# Patient Record
Sex: Female | Born: 1965 | Race: White | Hispanic: No | Marital: Single | Smoking: Current every day smoker
Health system: Southern US, Community
[De-identification: ages and names within clinical notes are randomized; demographics above are authoritative.]

## PROBLEM LIST (undated history)

## (undated) DIAGNOSIS — Z923 Personal history of irradiation: Secondary | ICD-10-CM

## (undated) DIAGNOSIS — I700 Atherosclerosis of aorta: Secondary | ICD-10-CM

## (undated) DIAGNOSIS — T7840XA Allergy, unspecified, initial encounter: Secondary | ICD-10-CM

## (undated) DIAGNOSIS — M1990 Unspecified osteoarthritis, unspecified site: Secondary | ICD-10-CM

## (undated) DIAGNOSIS — F329 Major depressive disorder, single episode, unspecified: Secondary | ICD-10-CM

## (undated) DIAGNOSIS — R7303 Prediabetes: Secondary | ICD-10-CM

## (undated) DIAGNOSIS — K219 Gastro-esophageal reflux disease without esophagitis: Secondary | ICD-10-CM

## (undated) DIAGNOSIS — F419 Anxiety disorder, unspecified: Secondary | ICD-10-CM

## (undated) DIAGNOSIS — F32A Depression, unspecified: Secondary | ICD-10-CM

## (undated) DIAGNOSIS — J449 Chronic obstructive pulmonary disease, unspecified: Secondary | ICD-10-CM

## (undated) DIAGNOSIS — M419 Scoliosis, unspecified: Secondary | ICD-10-CM

## (undated) DIAGNOSIS — G4730 Sleep apnea, unspecified: Secondary | ICD-10-CM

## (undated) DIAGNOSIS — C50919 Malignant neoplasm of unspecified site of unspecified female breast: Secondary | ICD-10-CM

## (undated) HISTORY — PX: BACK SURGERY: SHX140

## (undated) HISTORY — DX: Sleep apnea, unspecified: G47.30

## (undated) HISTORY — DX: Atherosclerosis of aorta: I70.0

## (undated) HISTORY — DX: Allergy, unspecified, initial encounter: T78.40XA

## (undated) HISTORY — DX: Scoliosis, unspecified: M41.9

## (undated) HISTORY — DX: Depression, unspecified: F32.A

## (undated) HISTORY — DX: Gastro-esophageal reflux disease without esophagitis: K21.9

## (undated) HISTORY — PX: ABDOMINAL HYSTERECTOMY: SHX81

## (undated) HISTORY — DX: Major depressive disorder, single episode, unspecified: F32.9

---
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
## 2008-05-12 ENCOUNTER — Ambulatory Visit: Payer: Self-pay

## 2011-08-03 ENCOUNTER — Emergency Department: Payer: Self-pay | Admitting: Emergency Medicine

## 2011-09-25 ENCOUNTER — Emergency Department: Payer: Self-pay | Admitting: Internal Medicine

## 2012-02-22 ENCOUNTER — Emergency Department: Payer: Self-pay | Admitting: Emergency Medicine

## 2012-02-24 ENCOUNTER — Emergency Department: Payer: Self-pay | Admitting: Emergency Medicine

## 2012-02-26 LAB — WOUND CULTURE

## 2013-01-18 ENCOUNTER — Emergency Department: Payer: Self-pay | Admitting: Emergency Medicine

## 2014-03-25 ENCOUNTER — Emergency Department: Payer: Self-pay | Admitting: Internal Medicine

## 2016-02-21 ENCOUNTER — Emergency Department: Payer: Self-pay

## 2016-02-21 ENCOUNTER — Encounter: Payer: Self-pay | Admitting: Emergency Medicine

## 2016-02-21 ENCOUNTER — Emergency Department
Admission: EM | Admit: 2016-02-21 | Discharge: 2016-02-21 | Disposition: A | Discharge status: Home / Self Care | Payer: Self-pay | Attending: Emergency Medicine | Admitting: Emergency Medicine

## 2016-02-21 DIAGNOSIS — F17200 Nicotine dependence, unspecified, uncomplicated: Secondary | ICD-10-CM | POA: Insufficient documentation

## 2016-02-21 DIAGNOSIS — J9801 Acute bronchospasm: Secondary | ICD-10-CM

## 2016-02-21 DIAGNOSIS — Z720 Tobacco use: Secondary | ICD-10-CM

## 2016-02-21 DIAGNOSIS — J209 Acute bronchitis, unspecified: Secondary | ICD-10-CM | POA: Insufficient documentation

## 2016-02-21 MED ORDER — ALBUTEROL SULFATE HFA 108 (90 BASE) MCG/ACT IN AERS: 1.0000 | INHALATION_SPRAY | Freq: Four times a day (QID) | RESPIRATORY_TRACT | 0 refills | Status: DC | PRN

## 2016-02-21 MED ORDER — IPRATROPIUM-ALBUTEROL 0.5-2.5 (3) MG/3ML IN SOLN
3.0000 mL | Freq: Once | RESPIRATORY_TRACT | Status: AC
  Administered 2016-02-21: 3 mL via RESPIRATORY_TRACT
  Filled 2016-02-21: qty 3

## 2016-02-21 MED ORDER — AZITHROMYCIN 250 MG PO TABS: ORAL_TABLET | ORAL | 0 refills | Status: DC

## 2016-02-21 MED ORDER — METHYLPREDNISOLONE SODIUM SUCC 125 MG IJ SOLR
125.0000 mg | Freq: Once | INTRAMUSCULAR | Status: DC
  Filled 2016-02-21: qty 2

## 2016-02-21 MED ORDER — PROMETHAZINE-DM 6.25-15 MG/5ML PO SYRP: 5.0000 mL | ORAL_SOLUTION | Freq: Four times a day (QID) | ORAL | 0 refills | Status: DC | PRN

## 2016-02-21 MED ORDER — METHYLPREDNISOLONE SODIUM SUCC 125 MG IJ SOLR
125.0000 mg | Freq: Once | INTRAMUSCULAR | Status: AC
  Administered 2016-02-21: 125 mg via INTRAMUSCULAR

## 2016-02-21 MED ORDER — FLUTICASONE PROPIONATE 50 MCG/ACT NA SUSP: 2.0000 | Freq: Every day | NASAL | 0 refills | Status: DC

## 2016-02-21 MED ORDER — PREDNISONE 10 MG PO TABS: ORAL_TABLET | ORAL | 0 refills | Status: DC

## 2016-02-21 NOTE — Discharge Instructions (Signed)
Rest. Push fluids. Take medications as prescribed.   Recommend quitting smoking.

## 2016-02-21 NOTE — ED Triage Notes (Signed)
Pt to ed with c/o cold chills, body aches, fever, cough, congestion since last Thursday.

## 2016-02-21 NOTE — ED Provider Notes (Signed)
[facility] Emergency Department Provider Note  ____________________________________________  Time seen: Approximately 9:34 AM  I have reviewed the triage vital signs and the nursing notes.   HISTORY  Chief Complaint Chills and Generalized Body Aches    HPI Margaret Blackburn is a 51 y.o. female , NAD, presents to the emergency department with five-day history of cough, chest congestion and wheezing. States she had onset of flulike symptoms last Thursday. Symptoms has progressed and she states she continues to feel fatigued due to cough and chest congestion. States she has a history of chronic bronchitis and typically gets acute bronchitis once every year. Has been taking over-the-counter medications without relief of her symptoms.  States she has been smoking at least one pack of cigarettes per day for at least 20 years. Does not have a primary care provider who manages her chronic symptoms. Denies any chest pain but has had shortness of breath and wheezing due to cough and chest congestion. Denies abdominal pain, nausea, vomiting or diarrhea. Has felt feverish and has taken ibuprofen sporadically for such. Also has nasal congestion, runny nose, sinus pressure and ear pressure. Denies sore throat. No other sick contacts.   History reviewed. No pertinent past medical history.  There are no active problems to display for this patient.   History reviewed. No pertinent surgical history.  Prior to Admission medications   Medication Sig Start Date End Date Taking? Authorizing Provider  albuterol (PROVENTIL HFA;VENTOLIN HFA) 108 (90 Base) MCG/ACT inhaler Inhale 1-2 puffs into the lungs every 6 (six) hours as needed for wheezing or shortness of breath. 02/21/16   Kaely Hollan L [place] Rosamond, PA-C  azithromycin (ZITHROMAX Z-PAK) 250 MG tablet Take 2 tablets (500 mg) on  Day 1,  followed by 1 tablet (250 mg) once daily on Days 2 through 5. 02/21/16   Purnell Daigle L Brenee Gajda, PA-C  fluticasone  (FLONASE) 50 MCG/ACT nasal spray Place 2 sprays into both nostrils daily. 02/21/16   Aithana Kushner L Aadhira Heffernan, PA-C  predniSONE (DELTASONE) 10 MG tablet Take a daily regimen of 6,5,4,3,2,1 02/21/16   Kent Braunschweig L Clarene Curran, PA-C  promethazine-dextromethorphan (PROMETHAZINE-DM) 6.25-15 MG/5ML syrup Take 5 mLs by mouth 4 (four) times daily as needed for cough. 02/21/16   Jemar Paulsen L Elysse Polidore, PA-C    Allergies Penicillins  History reviewed. No pertinent family history.  Social History Social History  Substance Use Topics  . Smoking status: Current Every Day Smoker  . Smokeless tobacco: Never Used  . Alcohol use No     Review of Systems  Constitutional: Positive fever, chills, fatigue. Eyes: No visual changes. No discharge ENT: Positive nasal congestion, runny nose, sinus pressure and ear pressure. No sore throat. Cardiovascular: No chest pain. Respiratory: Positive cough, chest congestion, shortness breath and wheezing.  Gastrointestinal: No abdominal pain.  No nausea, vomiting.  No diarrhea.   Musculoskeletal: Positive for general myalgias.  Skin: Negative for rash. Neurological: Negative for headaches, focal weakness or numbness. 10-point ROS otherwise negative.  ____________________________________________   PHYSICAL EXAM:  VITAL SIGNS: ED Triage Vitals  Enc Vitals Group     BP --      Pulse Rate 02/21/16 0851 97     Resp 02/21/16 0851 20     Temp 02/21/16 0851 97.9 F (36.6 C)     Temp Source 02/21/16 0851 Oral     SpO2 02/21/16 0851 100 %     Weight 02/21/16 0852 200 lb (90.7 kg)     Height 02/21/16 0852 5\' 3"  (1.6 m)  Head Circumference --      Peak Flow --      Pain Score 02/21/16 0852 0     Pain Loc --      Pain Edu? --      Excl. in [place]? --      Constitutional: Alert and oriented. Well appearing and in no acute distress. Eyes: Conjunctivae are normal Without icterus, injection or discharge. Head: Atraumatic. ENT:      Ears: Bilateral TMs visualized without erythema, effusion,  bulging or perforation.      Nose: Mild congestion with clear rhinorrhea. Bilateral turbinates are injected.      Mouth/Throat: Mucous membranes are moist. Pharynx without erythema, swelling, exudate. Clear postnasal drip. Uvula is midline. Airway is patent. Neck: Supple with full range of motion. Hematological/Lymphatic/Immunilogical: No cervical lymphadenopathy. Cardiovascular: Normal rate, regular rhythm. Normal S1 and S2.  Good peripheral circulation. Respiratory: Normal respiratory effort without tachypnea or retractions. Lungs with diffuse wheezing and rhonchi bilaterally. Breath sounds are heard in all lung fields. Neurologic:  Normal speech and language. No gross focal neurologic deficits are appreciated.  Skin:  Skin is warm, dry and intact. No rash noted. Psychiatric: Mood and affect are normal. Speech and behavior are normal. Patient exhibits appropriate insight and judgement.   ____________________________________________   LABS  None ____________________________________________  EKG  None ____________________________________________  RADIOLOGY I, [place], personally viewed and evaluated these images (plain radiographs) as part of my medical decision making, as well as reviewing the written report by the radiologist.  Dg Chest 2 View  Result Date: 02/21/2016 CLINICAL DATA:  Shortness of breath, productive cough, history of bronchitis, fever EXAM: CHEST  2 VIEW COMPARISON:  03/25/2014 FINDINGS: Cardiomediastinal silhouette is stable. Mild dextroscoliosis again noted. Stable metallic fixation rods thoracic spine. No infiltrate or pulmonary edema. Mild perihilar bronchitic changes. IMPRESSION: Stable postsurgical changes thoracic spine. Mild perihilar bronchitic changes. No infiltrate or pulmonary edema. Electronically Signed   By: Lahoma Crocker M.D.   On: 02/21/2016 10:05    ____________________________________________    PROCEDURES  Procedure(s) performed:  None   Procedures   Medications  ipratropium-albuterol (DUONEB) 0.5-2.5 (3) MG/3ML nebulizer solution 3 mL (3 mLs Nebulization Given 02/21/16 1000)  methylPREDNISolone sodium succinate (SOLU-MEDROL) 125 mg/2 mL injection 125 mg (125 mg Intramuscular Given 02/21/16 1000)     ____________________________________________   INITIAL IMPRESSION / ASSESSMENT AND PLAN / ED COURSE  Pertinent labs & imaging results that were available during my care of the patient were reviewed by me and considered in my medical decision making (see chart for details).  Clinical Course     Patient's diagnosis is consistent with Acute bronchitis and acute bronchospasm. Patient was given a DuoNeb breathing treatment and Solu-Medrol IM while in the emergency department. Wheezing and chest congestion or decreased after administration of breathing treatment. Patient will be discharged home with prescriptions for albuterol inhaler, azithromycin, Flonase, promethazine DM and prednisone Dosepak to take as directed. [facility] to establish care and if symptoms persist past this treatment course. Patient is given ED precautions to return to the ED for any worsening or new symptoms.    ____________________________________________  FINAL CLINICAL IMPRESSION(S) / ED DIAGNOSES  Final diagnoses:  Acute bronchitis, unspecified organism  Acute bronchospasm  Tobacco use      NEW MEDICATIONS STARTED DURING THIS VISIT:  New Prescriptions   ALBUTEROL (PROVENTIL HFA;VENTOLIN HFA) 108 (90 BASE) MCG/ACT INHALER  Inhale 1-2 puffs into the lungs every 6 (six) hours as needed for wheezing or shortness of breath.   AZITHROMYCIN (ZITHROMAX Z-PAK) 250 MG TABLET    Take 2 tablets (500 mg) on  Day 1,  followed by 1 tablet (250 mg) once daily on Days 2 through 5.   FLUTICASONE (FLONASE) 50 MCG/ACT NASAL SPRAY    Place 2 sprays into both nostrils daily.    PREDNISONE (DELTASONE) 10 MG TABLET    Take a daily regimen of 6,5,4,3,2,1   PROMETHAZINE-DEXTROMETHORPHAN (PROMETHAZINE-DM) 6.25-15 MG/5ML SYRUP    Take 5 mLs by mouth 4 (four) times daily as needed for cough.         Braxton Feathers, PA-C 02/21/16 [place] Quigley, MD 02/21/16 1050

## 2016-02-21 NOTE — ED Notes (Signed)
See triage note  Fever/chills  Body aches with congestion since last Thursday  Afebrile on arrival

## 2016-02-24 ENCOUNTER — Emergency Department: Payer: Self-pay

## 2016-02-24 ENCOUNTER — Emergency Department
Admission: EM | Admit: 2016-02-24 | Discharge: 2016-02-24 | Disposition: A | Discharge status: Home / Self Care | Payer: Self-pay | Attending: Emergency Medicine | Admitting: Emergency Medicine

## 2016-02-24 DIAGNOSIS — R059 Cough, unspecified: Secondary | ICD-10-CM

## 2016-02-24 DIAGNOSIS — R0602 Shortness of breath: Secondary | ICD-10-CM | POA: Insufficient documentation

## 2016-02-24 DIAGNOSIS — R05 Cough: Secondary | ICD-10-CM | POA: Insufficient documentation

## 2016-02-24 DIAGNOSIS — F17200 Nicotine dependence, unspecified, uncomplicated: Secondary | ICD-10-CM | POA: Insufficient documentation

## 2016-02-24 DIAGNOSIS — R0789 Other chest pain: Secondary | ICD-10-CM | POA: Insufficient documentation

## 2016-02-24 DIAGNOSIS — Z79899 Other long term (current) drug therapy: Secondary | ICD-10-CM | POA: Insufficient documentation

## 2016-02-24 LAB — CBC
HEMATOCRIT: 41.7 % (ref 35.0–47.0)
HEMOGLOBIN: 14.1 g/dL (ref 12.0–16.0)
MCH: 30.6 pg (ref 26.0–34.0)
MCHC: 33.9 g/dL (ref 32.0–36.0)
MCV: 90.1 fL (ref 80.0–100.0)
Platelets: 372 10*3/uL (ref 150–440)
RBC: 4.62 MIL/uL (ref 3.80–5.20)
RDW: 13.6 % (ref 11.5–14.5)
WBC: 14.9 10*3/uL — AB (ref 3.6–11.0)

## 2016-02-24 LAB — BASIC METABOLIC PANEL
ANION GAP: 11 (ref 5–15)
BUN: 20 mg/dL (ref 6–20)
CHLORIDE: 101 mmol/L (ref 101–111)
CO2: 24 mmol/L (ref 22–32)
Calcium: 9.4 mg/dL (ref 8.9–10.3)
Creatinine, Ser: 0.64 mg/dL (ref 0.44–1.00)
GFR calc non Af Amer: >60 mL/min (ref >60)
Glucose, Bld: 203 mg/dL — ABNORMAL HIGH (ref 65–99)
Potassium: 4.1 mmol/L (ref 3.5–5.1)
Sodium: 136 mmol/L (ref 135–145)

## 2016-02-24 LAB — TROPONIN I: Troponin I: <0.03 ng/mL (ref <0.03)

## 2016-02-24 MED ORDER — OXYCODONE-ACETAMINOPHEN 5-325 MG PO TABS
2.0000 | ORAL_TABLET | Freq: Once | ORAL | Status: AC
  Administered 2016-02-24: 2 via ORAL
  Filled 2016-02-24: qty 2

## 2016-02-24 MED ORDER — HYDROCOD POLST-CPM POLST ER 10-8 MG/5ML PO SUER: 5.0000 mL | Freq: Two times a day (BID) | ORAL | 0 refills | Status: DC

## 2016-02-24 NOTE — ED Provider Notes (Signed)
[facility] Emergency Department Provider Note        Time seen: ----------------------------------------- 7:17 PM on 02/24/2016 -----------------------------------------    I have reviewed the triage vital signs and the nursing notes.   HISTORY  Chief Complaint Cough    HPI Margaret Blackburn is a 51 y.o. female who presents to the ER for persistent cough. Patient was seen here Tuesday and diagnosed with bronchitis. Patient reports Wednesday she started having severe right pain in her right rib cage and side. Nothing seems to make it better, coughing makes it worse. She reports not being able to sleep due to the severe right rib pain.   History reviewed. No pertinent past medical history.  There are no active problems to display for this patient.   History reviewed. No pertinent surgical history.  Allergies Penicillins  Social History Social History  Substance Use Topics  . Smoking status: Current Every Day Smoker  . Smokeless tobacco: Never Used  . Alcohol use No    Review of Systems Constitutional: Negative for fever. Cardiovascular: Negative for chest pain. Respiratory: Positive for shortness of breath and cough Gastrointestinal: Negative for abdominal pain, vomiting and diarrhea. Genitourinary: Negative for dysuria. Musculoskeletal: Positive for right-sided rib pain Skin: Negative for rash. Neurological: Negative for headaches, focal weakness or numbness.  10-point ROS otherwise negative.  ____________________________________________   PHYSICAL EXAM:  VITAL SIGNS: ED Triage Vitals [02/24/16 1703]  Enc Vitals Group     BP 131/85     Pulse Rate 100     Resp 16     Temp 97.8 F (36.6 C)     Temp Source Oral     SpO2 99 %     Weight 200 lb (90.7 kg)     Height 5\' 3"  (1.6 m)     Head Circumference      Peak Flow      Pain Score 10     Pain Loc      Pain Edu?      Excl. in [place]?     Constitutional: Alert and oriented.  Well appearing and in no distress. Eyes: Conjunctivae are normal. PERRL. Normal extraocular movements. ENT   Head: Normocephalic and atraumatic.   Nose: No congestion/rhinnorhea.   Mouth/Throat: Mucous membranes are moist.   Neck: No stridor. Cardiovascular: Normal rate, regular rhythm. No murmurs, rubs, or gallops. Respiratory: Normal respiratory effort without tachypnea nor retractions. Breath sounds are clear and equal bilaterally. No wheezes/rales/rhonchi. Gastrointestinal: Soft and nontender. Normal bowel sounds Musculoskeletal: Nontender with normal range of motion in all extremities. No lower extremity tenderness nor edema. Neurologic:  Normal speech and language. No gross focal neurologic deficits are appreciated.  Skin:  Skin is warm, dry and intact. No rash noted. Psychiatric: Mood and affect are normal. Speech and behavior are normal.  ____________________________________________  EKG: Interpreted by me. Sinus bradycardia with rate of 57 bpm, normal PR interval, normal QRS, normal QT, normal axis.  ____________________________________________  ED COURSE:  Pertinent labs & imaging results that were available during my care of the patient were reviewed by me and considered in my medical decision making (see chart for details). Patient presents to the ER in no distress with right-sided rib pain and persistent cough. We will recheck labs and imaging.   Procedures ____________________________________________   LABS (pertinent positives/negatives)  Labs Reviewed  BASIC METABOLIC PANEL - Abnormal; Notable for the following:       Result Value   Glucose, Bld 203 (*)  All other components within normal limits  CBC - Abnormal; Notable for the following:    WBC 14.9 (*)    All other components within normal limits  TROPONIN I    RADIOLOGY  Right rib series IMPRESSION: No evidence of rib fracture. ____________________________________________  FINAL  ASSESSMENT AND PLAN  Cough  Plan: Patient with labs and imaging as dictated above. Patient is in no acute distress, Wells criteria indicates she is low risk for PE. Leukocytosis is from recent steroids. I will prescribe Tussionex for pain and cough. She is stable for outpatient follow-up.   Earleen Newport, MD   Note: This note was generated in part or whole with voice recognition software. Voice recognition is usually quite accurate but there are transcription errors that can and very often do occur. I apologize for any typographical errors that were not detected and corrected.     Earleen Newport, MD 02/24/16 2037

## 2016-02-24 NOTE — ED Triage Notes (Signed)
Pt reports that she was her Tuesday and dx with bronchitis - she reports that Wednesday she started having severe pain in right rib cage/side (worse with cough/sneezing)

## 2016-06-13 ENCOUNTER — Emergency Department
Admission: EM | Admit: 2016-06-13 | Discharge: 2016-06-13 | Disposition: A | Discharge status: Home / Self Care | Payer: Self-pay | Attending: Emergency Medicine | Admitting: Emergency Medicine

## 2016-06-13 ENCOUNTER — Encounter: Payer: Self-pay | Admitting: *Deleted

## 2016-06-13 DIAGNOSIS — F17200 Nicotine dependence, unspecified, uncomplicated: Secondary | ICD-10-CM | POA: Insufficient documentation

## 2016-06-13 DIAGNOSIS — J449 Chronic obstructive pulmonary disease, unspecified: Secondary | ICD-10-CM | POA: Insufficient documentation

## 2016-06-13 DIAGNOSIS — M79671 Pain in right foot: Secondary | ICD-10-CM | POA: Insufficient documentation

## 2016-06-13 DIAGNOSIS — Z79899 Other long term (current) drug therapy: Secondary | ICD-10-CM | POA: Insufficient documentation

## 2016-06-13 DIAGNOSIS — R21 Rash and other nonspecific skin eruption: Secondary | ICD-10-CM | POA: Insufficient documentation

## 2016-06-13 HISTORY — DX: Chronic obstructive pulmonary disease, unspecified: J44.9

## 2016-06-13 MED ORDER — KETOROLAC TROMETHAMINE 30 MG/ML IJ SOLN
30.0000 mg | Freq: Once | INTRAMUSCULAR | Status: DC
  Filled 2016-06-13: qty 1

## 2016-06-13 MED ORDER — HYDROCORTISONE 1 % EX OINT: 1.0000 "application " | TOPICAL_OINTMENT | Freq: Two times a day (BID) | CUTANEOUS | 0 refills | Status: DC

## 2016-06-13 MED ORDER — NAPROXEN 500 MG PO TABS: 500.0000 mg | ORAL_TABLET | Freq: Two times a day (BID) | ORAL | 2 refills | Status: DC

## 2016-06-13 MED ORDER — KETOROLAC TROMETHAMINE 30 MG/ML IJ SOLN
30.0000 mg | Freq: Once | INTRAMUSCULAR | Status: AC
  Administered 2016-06-13: 30 mg via INTRAMUSCULAR

## 2016-06-13 NOTE — ED Notes (Signed)
Pt reports that she has numbness bilat feet with redness on the inner aspect of right ankle - she also reports pain in bilat feet with ambulation - numbness and tingling have been present for the last both but pain with ambulation started two days ago

## 2016-06-13 NOTE — ED Provider Notes (Signed)
[facility] Emergency Department Provider Note   ____________________________________________    I have reviewed the triage vital signs and the nursing notes.   HISTORY  Chief Complaint Foot Pain     HPI Margaret Blackburn is a 51 y.o. female who presents with complaints of right foot pain. Patient reports she has an aching in her right foot which she treats arthritis. This is been worse today making it difficult for her to bear weight. However she is able to ambulate. She also states a greater than 1 month history of tingling/numbness on the top of her feet, she is not diabetic. In addition she describes a mild red rash on her right inner ankle which has improved since yesterday. No fevers or chills. No injury to the area. No swelling. No calf pain   Past Medical History:  Diagnosis Date  . COPD (chronic obstructive pulmonary disease) (HCC)     There are no active problems to display for this patient.   History reviewed. No pertinent surgical history.  Prior to Admission medications   Medication Sig Start Date End Date Taking? Authorizing Provider  albuterol (PROVENTIL HFA;VENTOLIN HFA) 108 (90 Base) MCG/ACT inhaler Inhale 1-2 puffs into the lungs every 6 (six) hours as needed for wheezing or shortness of breath. 02/21/16   Hagler, Jami L, PA-C  azithromycin (ZITHROMAX Z-PAK) 250 MG tablet Take 2 tablets (500 mg) on  Day 1,  followed by 1 tablet (250 mg) once daily on Days 2 through 5. 02/21/16   Hagler, Jami L, PA-C  chlorpheniramine-HYDROcodone (TUSSIONEX PENNKINETIC ER) 10-8 MG/5ML SUER Take 5 mLs by mouth 2 (two) times daily. 02/24/16   Earleen Newport, MD  fluticasone (FLONASE) 50 MCG/ACT nasal spray Place 2 sprays into both nostrils daily. 02/21/16   Hagler, Jami L, PA-C  hydrocortisone 1 % ointment Apply 1 application topically 2 (two) times daily. 06/13/16   Lavonia Drafts, MD  naproxen (NAPROSYN) 500 MG tablet Take 1 tablet (500 mg total) by  mouth 2 (two) times daily with a meal. 06/13/16   Lavonia Drafts, MD  predniSONE (DELTASONE) 10 MG tablet Take a daily regimen of 6,5,4,3,2,1 02/21/16   Hagler, Jami L, PA-C  promethazine-dextromethorphan (PROMETHAZINE-DM) 6.25-15 MG/5ML syrup Take 5 mLs by mouth 4 (four) times daily as needed for cough. 02/21/16   Hagler, Jami L, PA-C     Allergies Penicillins  History reviewed. No pertinent family history.  Social History Social History  Substance Use Topics  . Smoking status: Current Every Day Smoker  . Smokeless tobacco: Never Used  . Alcohol use No    Review of Systems  Constitutional: No fever/chills      Musculoskeletal: Chronic back pain Skin: As above Neurological: Negative for weakness    ____________________________________________   PHYSICAL EXAM:  VITAL SIGNS: ED Triage Vitals  Enc Vitals Group     BP 06/13/16 1026 (!) 142/73     Pulse Rate 06/13/16 1026 (!) 109     Resp 06/13/16 1026 16     Temp 06/13/16 1026 98 F (36.7 C)     Temp Source 06/13/16 1026 Oral     SpO2 06/13/16 1026 100 %     Weight 06/13/16 1025 210 lb (95.3 kg)     Height 06/13/16 1025 5\' 3"  (1.6 m)     Head Circumference --      Peak Flow --      Pain Score 06/13/16 1024 6     Pain Loc --  Pain Edu? --      Excl. in [place]? --     Constitutional: Alert and oriented. No acute distress. Pleasant and interactive   Nose: No congestion/rhinnorhea. Mouth/Throat: Mucous membranes are moist.   Cardiovascular: Normal rate, regular rhythm.  Respiratory: Normal respiratory effort.  No retractions. Genitourinary: deferred Musculoskeletal: No swelling of the feet. No calf pain or swelling or tenderness. Foot exam is essentially normal with normal range of motion, a faint area of erythema on the medial malleolus of the right foot no tenderness palpation, not consistent with cellulitis. Warm and well perfused. Neurologic:  Normal speech and language. No gross focal neurologic deficits are  appreciated.   Skin:  Skin is warm, dry and intact. No rash noted.   ____________________________________________   LABS (all labs ordered are listed, but only abnormal results are displayed)  Labs Reviewed - No data to display ____________________________________________  EKG   ____________________________________________  RADIOLOGY  none  ____________________________________________   PROCEDURES  Procedure(s) performed: No    Critical Care performed: No ____________________________________________   INITIAL IMPRESSION / ASSESSMENT AND PLAN / ED COURSE  Pertinent labs & imaging results that were available during my care of the patient were reviewed by me and considered in my medical decision making (see chart for details).  Patient well-appearing and in no acute distress. Normal exam, not consistent with cellulitis. Warm and well perfused. Able to ambulate in the room. We'll discharge with NSAIDs outpatient follow-up   ____________________________________________   FINAL CLINICAL IMPRESSION(S) / ED DIAGNOSES  Final diagnoses:  Foot pain, right  Rash      NEW MEDICATIONS STARTED DURING THIS VISIT:  Discharge Medication List as of 06/13/2016 11:47 AM    START taking these medications   Details  hydrocortisone 1 % ointment Apply 1 application topically 2 (two) times daily., Starting Wed 06/13/2016, Print    naproxen (NAPROSYN) 500 MG tablet Take 1 tablet (500 mg total) by mouth 2 (two) times daily with a meal., Starting Wed 06/13/2016, Print         Note:  This document was prepared using Dragon voice recognition software and may include unintentional dictation errors.    Lavonia Drafts, MD 06/13/16 1316

## 2016-06-13 NOTE — ED Triage Notes (Signed)
States tingling on the top of both of her feet for 1 month, states when she itches her ankle the tingling goes up her leg, ambulatory, awake and alert, small area of redness noted to right ankle area

## 2017-11-11 ENCOUNTER — Emergency Department (HOSPITAL_COMMUNITY)
Admission: EM | Admit: 2017-11-11 | Discharge: 2017-11-11 | Disposition: A | Discharge status: Home / Self Care | Payer: Self-pay | Attending: Emergency Medicine | Admitting: Emergency Medicine

## 2017-11-11 ENCOUNTER — Other Ambulatory Visit: Payer: Self-pay

## 2017-11-11 ENCOUNTER — Encounter (HOSPITAL_COMMUNITY): Payer: Self-pay

## 2017-11-11 ENCOUNTER — Emergency Department (HOSPITAL_COMMUNITY): Payer: Self-pay

## 2017-11-11 DIAGNOSIS — M25561 Pain in right knee: Secondary | ICD-10-CM | POA: Insufficient documentation

## 2017-11-11 DIAGNOSIS — J449 Chronic obstructive pulmonary disease, unspecified: Secondary | ICD-10-CM | POA: Insufficient documentation

## 2017-11-11 DIAGNOSIS — F17210 Nicotine dependence, cigarettes, uncomplicated: Secondary | ICD-10-CM | POA: Insufficient documentation

## 2017-11-11 DIAGNOSIS — N644 Mastodynia: Secondary | ICD-10-CM | POA: Insufficient documentation

## 2017-11-11 DIAGNOSIS — Z79899 Other long term (current) drug therapy: Secondary | ICD-10-CM | POA: Insufficient documentation

## 2017-11-11 DIAGNOSIS — R202 Paresthesia of skin: Secondary | ICD-10-CM | POA: Insufficient documentation

## 2017-11-11 MED ORDER — IBUPROFEN 800 MG PO TABS
800.0000 mg | ORAL_TABLET | Freq: Once | ORAL | Status: AC
Start: 2017-11-11 — End: 2017-11-11
  Administered 2017-11-11: 800 mg via ORAL
  Filled 2017-11-11: qty 1

## 2017-11-11 MED ORDER — ACETAMINOPHEN 500 MG PO TABS
1000.0000 mg | ORAL_TABLET | Freq: Once | ORAL | Status: AC
  Administered 2017-11-11: 1000 mg via ORAL
  Filled 2017-11-11: qty 2

## 2017-11-11 NOTE — ED Notes (Signed)
ED Provider at bedside. 

## 2017-11-11 NOTE — ED Provider Notes (Signed)
[facility] EMERGENCY DEPARTMENT Provider Note   CSN: [id number] Arrival date & time: 11/11/17  1051     History   Chief Complaint Chief Complaint  Patient presents with  . Knee Pain  . multiple complaints    HPI Margaret Blackburn is a 52 y.o. female.  52 yo F with a chief complaint of right knee pain.  This been going on for the past couple days.  She also has been complaining of a tingling to bilateral feet on the tops and bottoms.  She also has had some tingling to the right lateral aspect of her lower leg.  She also has noticed that she has a new spot on her nipple that is somewhat irritated and painful.  She denies any nipple drainage.  Denies mass.  She denies fevers or chills.  She denies injury to her knee.  She has been able to ambulate but has pain with that.  Improves with rest.  Has not seen a doctor.  The history is provided by the patient.  Illness  This is a new problem. The current episode started more than 1 week ago. The problem occurs constantly. The problem has not changed since onset.Pertinent negatives include no chest pain, no abdominal pain, no headaches and no shortness of breath. The symptoms are aggravated by bending, twisting and walking. Nothing relieves the symptoms. She has tried nothing for the symptoms. The treatment provided no relief.    Past Medical History:  Diagnosis Date  . COPD (chronic obstructive pulmonary disease) (HCC)     There are no active problems to display for this patient.   Past Surgical History:  Procedure Laterality Date  . BACK SURGERY       OB History   None      Home Medications    Prior to Admission medications   Medication Sig Start Date End Date Taking? Authorizing Provider  albuterol (PROVENTIL HFA;VENTOLIN HFA) 108 (90 Base) MCG/ACT inhaler Inhale 1-2 puffs into the lungs every 6 (six) hours as needed for wheezing or shortness of breath. 02/21/16   Hagler, Jami L, PA-C  azithromycin  (ZITHROMAX Z-PAK) 250 MG tablet Take 2 tablets (500 mg) on  Day 1,  followed by 1 tablet (250 mg) once daily on Days 2 through 5. 02/21/16   Hagler, Jami L, PA-C  chlorpheniramine-HYDROcodone (TUSSIONEX PENNKINETIC ER) 10-8 MG/5ML SUER Take 5 mLs by mouth 2 (two) times daily. 02/24/16   Earleen Newport, MD  fluticasone (FLONASE) 50 MCG/ACT nasal spray Place 2 sprays into both nostrils daily. 02/21/16   Hagler, Jami L, PA-C  hydrocortisone 1 % ointment Apply 1 application topically 2 (two) times daily. 06/13/16   Lavonia Drafts, MD  naproxen (NAPROSYN) 500 MG tablet Take 1 tablet (500 mg total) by mouth 2 (two) times daily with a meal. 06/13/16   Lavonia Drafts, MD  predniSONE (DELTASONE) 10 MG tablet Take a daily regimen of 6,5,4,3,2,1 02/21/16   Hagler, Jami L, PA-C  promethazine-dextromethorphan (PROMETHAZINE-DM) 6.25-15 MG/5ML syrup Take 5 mLs by mouth 4 (four) times daily as needed for cough. 02/21/16   Hagler, Judithe Modest, PA-C    Family History History reviewed. No pertinent family history.  Social History Social History   Tobacco Use  . Smoking status: Current Every Day Smoker    Packs/day: 0.50    Types: Cigarettes  . Smokeless tobacco: Never Used  Substance Use Topics  . Alcohol use: No  . Drug use: No  Allergies   Penicillins   Review of Systems Review of Systems  Constitutional: Negative for chills and fever.  HENT: Negative for congestion and rhinorrhea.   Eyes: Negative for redness and visual disturbance.  Respiratory: Negative for shortness of breath and wheezing.   Cardiovascular: Negative for chest pain and palpitations.  Gastrointestinal: Negative for abdominal pain, nausea and vomiting.  Genitourinary: Negative for dysuria and urgency.  Musculoskeletal: Positive for arthralgias and myalgias.  Skin: Negative for pallor and wound.  Neurological: Positive for numbness (tingling). Negative for dizziness and headaches.     Physical Exam Updated Vital Signs BP  126/83 (BP Location: Right Arm)   Pulse 90   Temp 98.9 F (37.2 C) (Oral)   Resp 18   Ht 5\' 2"  (1.575 m)   Wt 95.3 kg   SpO2 99%   BMI 38.41 kg/m   Physical Exam  Constitutional: She is oriented to person, place, and time. She appears well-developed and well-nourished. No distress.  HENT:  Head: Normocephalic and atraumatic.  Eyes: Pupils are equal, round, and reactive to light. EOM are normal.  Neck: Normal range of motion. Neck supple.  Cardiovascular: Normal rate and regular rhythm. Exam reveals no gallop and no friction rub.  No murmur heard. Pulmonary/Chest: Effort normal. She has no wheezes. She has no rales.    Abdominal: Soft. She exhibits no distension. There is no tenderness.  Musculoskeletal: She exhibits no edema or tenderness.  Pulse motor and sensation is intact of the right lower and left lower extremity.  She has full range of motion of the right knee.  Trace effusion.  No ligamentous laxity.  Negative McMurray's test.  No pain along the spine.  Full range of motion of the right hip.  Neurological: She is alert and oriented to person, place, and time.  Skin: Skin is warm and dry. She is not diaphoretic.  Psychiatric: She has a normal mood and affect. Her behavior is normal.  Nursing note and vitals reviewed.    ED Treatments / Results  Labs (all labs ordered are listed, but only abnormal results are displayed) Labs Reviewed - No data to display  EKG None  Radiology Dg Knee Complete 4 Views Right  Result Date: 11/11/2017 CLINICAL DATA:  Acute medial knee pain.  No injury. EXAM: RIGHT KNEE - COMPLETE 4+ VIEW COMPARISON:  None. FINDINGS: No evidence of fracture, dislocation, or joint effusion. No evidence of arthropathy or other focal bone abnormality. Soft tissues are unremarkable. IMPRESSION: Negative. Electronically Signed   By: Titus Dubin M.D.   On: 11/11/2017 12:02    Procedures Procedures (including critical care time)  Medications Ordered in  ED Medications  acetaminophen (TYLENOL) tablet 1,000 mg (has no administration in time range)  ibuprofen (ADVIL,MOTRIN) tablet 800 mg (has no administration in time range)     Initial Impression / Assessment and Plan / ED Course  I have reviewed the triage vital signs and the nursing notes.  Pertinent labs & imaging results that were available during my care of the patient were reviewed by me and considered in my medical decision making (see chart for details).     52 yo F with multiple complaints.  X-ray of the right knee without fracture as viewed by me.  I am unable to reproduce her pain through any maneuvers.  We will place her in a knee immobilizer give her crutches.  She is complaining of paresthesias to bilateral feet which most likely is neuropathy.  She has intact pulse  motor and sensation distally.  She has had multiple spinal surgeries when she was young.  She is waning of the paresthesias to the right lateral leg.  She has no midline tenderness and has had no recent trauma.  I do not feel that imaging of the back is warranted at this time.  She has stated new lesion to her right nipple.  I suggested that she follow-up with her family doctor for possible referral to have a evaluation by a dermatologist or at the breast center.  12:51 PM:  I have discussed the diagnosis/risks/treatment options with the patient and believe the pt to be eligible for discharge home to follow-up with PCP. We also discussed returning to the ED immediately if new or worsening sx occur. We discussed the sx which are most concerning (e.g., sudden worsening pain, fever, inability to tolerate by mouth) that necessitate immediate return. Medications administered to the patient during their visit and any new prescriptions provided to the patient are listed below.  Medications given during this visit Medications  acetaminophen (TYLENOL) tablet 1,000 mg (has no administration in time range)  ibuprofen (ADVIL,MOTRIN)  tablet 800 mg (has no administration in time range)      The patient appears reasonably screen and/or stabilized for discharge and I doubt any other medical condition or other [facility] requiring further screening, evaluation, or treatment in the ED at this time prior to discharge.    Final Clinical Impressions(s) / ED Diagnoses   Final diagnoses:  Nipple pain  Acute pain of right knee  Paresthesia    ED Discharge Orders    None       Deno Etienne, DO 11/11/17 1251

## 2017-11-11 NOTE — ED Notes (Signed)
Pt refused knee immobilizer and crutches.

## 2017-11-11 NOTE — ED Triage Notes (Signed)
Pt endorses right knee pain, bilateral foot numbness x 1 years, intermittent right upper leg numbness x 1 year, lower back pain(has 2 metal rods in back) and "I have a knot on my nipple x 2 days"

## 2017-11-11 NOTE — Discharge Instructions (Signed)
Take 4 over the counter ibuprofen tablets 3 times a day or 2 over-the-counter naproxen tablets twice a day for pain. Also take tylenol 1000mg(2 extra strength) four times a day.    

## 2017-11-11 NOTE — ED Notes (Signed)
Pt ambulated to treatment room and placed in gown.

## 2017-11-27 ENCOUNTER — Ambulatory Visit
Admission: RE | Admit: 2017-11-27 | Discharge: 2017-11-27 | Disposition: A | Discharge status: Home / Self Care | Payer: Self-pay | Source: Ambulatory Visit | Attending: Oncology | Admitting: Oncology

## 2017-11-27 ENCOUNTER — Ambulatory Visit: Payer: Self-pay | Attending: Oncology

## 2017-11-27 VITALS — BP 117/82 | HR 101 | Temp 98.0°F | Ht 64.0 in | Wt 216.0 lb

## 2017-11-27 DIAGNOSIS — Z0000 Encounter for general adult medical examination without abnormal findings: Secondary | ICD-10-CM

## 2017-11-27 NOTE — Progress Notes (Addendum)
  Subjective:     Patient ID: Margaret Blackburn, female   DOB: 1965-06-12, 52 y.o.   MRN: [id number]  HPI   Review of Systems     Objective:   Physical Exam  Pulmonary/Chest: Right breast exhibits no inverted nipple, no mass, no nipple discharge, no skin change and no tenderness. Left breast exhibits no inverted nipple, no mass, no nipple discharge, no skin change and no tenderness. Breasts are symmetrical.  Genitourinary:    No labial fusion. There is lesion on the right labia. There is no rash, tenderness or injury on the right labia. There is lesion on the left labia. There is no rash, tenderness or injury on the left labia. Cervix exhibits no motion tenderness, no discharge and no friability. Right adnexum displays no mass, no tenderness and no fullness. Left adnexum displays no mass, no tenderness and no fullness. No erythema, tenderness or bleeding in the vagina. No foreign body in the vagina. No signs of injury around the vagina. No vaginal discharge found.  Genitourinary Comments: Genital warts noted; supracervical hysterectomy       Assessment:     52 year old patient presents for [place] clinic visit. Patient screened, and meets BCCCP eligibility.  Patient does not have insurance, Medicare or Medicaid.  Handout given on Affordable Care Act. Instructed patient on breast self awareness using teach back method.  Clinical breast exam unremarkable.  Patient reports she had red area on right nipple a month ago that had a yellowish, pus , discharge.  Not visible today.  To return to clinic if area returns.  Patient had a supracervical hysterectomy 8/8/ 2002 with Dr. Davis Gourd. Pelvic exam reveals genital warts on labia, and rectal area. Will wait  For pap results, and refer to GYN.  Explained to patient that BCCCP will not cover wart treatment, but if pap abnormal and HPV positive, will cover consult with GYN. Explained Patient financial assistance application may be necessary for  treatment.  Risk Assessment    Risk Scores      11/27/2017   Last edited by: Rico Junker, RN   5-year risk: 2.1 %   Lifetime risk: 17.6 %            Plan:     Sent for bilateral screening mammogram. Specimen collected for pap.

## 2017-11-28 ENCOUNTER — Other Ambulatory Visit: Payer: Self-pay

## 2017-11-28 DIAGNOSIS — N63 Unspecified lump in unspecified breast: Secondary | ICD-10-CM

## 2017-12-02 LAB — PAP LB AND HPV HIGH-RISK
HPV, HIGH-RISK: NEGATIVE
PAP Smear Comment: 0

## 2017-12-06 DIAGNOSIS — C50919 Malignant neoplasm of unspecified site of unspecified female breast: Secondary | ICD-10-CM

## 2017-12-06 HISTORY — DX: Malignant neoplasm of unspecified site of unspecified female breast: C50.919

## 2017-12-09 ENCOUNTER — Ambulatory Visit
Admission: RE | Admit: 2017-12-09 | Discharge: 2017-12-09 | Disposition: A | Discharge status: Home / Self Care | Payer: Self-pay | Source: Ambulatory Visit | Attending: Oncology | Admitting: Oncology

## 2017-12-09 DIAGNOSIS — N63 Unspecified lump in unspecified breast: Secondary | ICD-10-CM

## 2017-12-11 ENCOUNTER — Other Ambulatory Visit: Payer: Self-pay

## 2017-12-11 DIAGNOSIS — N63 Unspecified lump in unspecified breast: Secondary | ICD-10-CM

## 2017-12-19 ENCOUNTER — Ambulatory Visit: Payer: Self-pay

## 2017-12-25 ENCOUNTER — Ambulatory Visit
Admission: RE | Admit: 2017-12-25 | Discharge: 2017-12-25 | Disposition: A | Discharge status: Home / Self Care | Payer: Medicaid Other | Source: Ambulatory Visit | Attending: Oncology | Admitting: Oncology

## 2017-12-25 DIAGNOSIS — N63 Unspecified lump in unspecified breast: Secondary | ICD-10-CM

## 2017-12-25 HISTORY — PX: BREAST BIOPSY: SHX20

## 2017-12-30 LAB — SURGICAL PATHOLOGY

## 2017-12-31 NOTE — Progress Notes (Signed)
Phoned patient with right breast biopsy results, and recommendation for surgical consult.  Scheduled with Dr. Bary Castilla on 01/07/18 .  Negative/negative pap results also given to patient.

## 2018-01-07 ENCOUNTER — Other Ambulatory Visit: Payer: Self-pay | Admitting: General Surgery

## 2018-01-07 ENCOUNTER — Ambulatory Visit (INDEPENDENT_AMBULATORY_CARE_PROVIDER_SITE_OTHER): Payer: Self-pay | Admitting: General Surgery

## 2018-01-07 ENCOUNTER — Ambulatory Visit: Payer: Self-pay

## 2018-01-07 ENCOUNTER — Other Ambulatory Visit: Payer: Self-pay

## 2018-01-07 ENCOUNTER — Encounter: Payer: Self-pay | Admitting: General Surgery

## 2018-01-07 VITALS — BP 128/79 | HR 99 | Temp 98.1°F | Resp 16 | Ht 62.0 in | Wt 219.2 lb

## 2018-01-07 DIAGNOSIS — N6310 Unspecified lump in the right breast, unspecified quadrant: Secondary | ICD-10-CM

## 2018-01-07 NOTE — Progress Notes (Signed)
Patient ID: Margaret Blackburn, female   DOB: 11/04/1965, 52 y.o.   MRN: [id number]  Chief Complaint  Patient presents with  . New Patient (Initial Visit)    Right breast consultation     HPI Margaret Blackburn is a 52 y.o. female.  Here today for right breast consultation. Patient stated 2 weeks she had yellowish drainage from the right nipple. Pain and tenderness. Patient cannot feel the area of concern. Denies fever or chills.  HPI  Past Medical History:  Diagnosis Date  . COPD (chronic obstructive pulmonary disease) ([place])     Past Surgical History:  Procedure Laterality Date  . ABDOMINAL HYSTERECTOMY    . BACK SURGERY    . BREAST BIOPSY Right 12/25/2017   affirm bx , x clip, path pending    Family History  Problem Relation Age of Onset  . Breast cancer Mother 73  . Breast cancer Maternal Grandmother     Social History Social History   Tobacco Use  . Smoking status: Current Every Day Smoker    Packs/day: 0.50    Types: Cigarettes  . Smokeless tobacco: Never Used  Substance Use Topics  . Alcohol use: No  . Drug use: No    Allergies  Allergen Reactions  . Penicillins Rash    No current outpatient medications on file.   No current facility-administered medications for this visit.     Review of Systems Review of Systems  Constitutional: Negative.   Respiratory: Negative.   Cardiovascular: Negative.   Skin: Negative.     Blood pressure 128/79, pulse 99, temperature 98.1 F (36.7 C), temperature source Temporal, resp. rate 16, height 5\' 2"  (1.575 m), weight 219 lb 3.2 oz (99.4 kg), SpO2 97 %.  Physical Exam Physical Exam  Constitutional: She is oriented to person, place, and time. She appears well-developed and well-nourished.  Eyes: Conjunctivae are normal. No scleral icterus.  Neck: Normal range of motion.  Cardiovascular: Normal rate, regular rhythm and normal heart sounds.  Pulmonary/Chest: Effort normal and breath sounds normal. Right breast exhibits  no mass and no tenderness. Left breast exhibits no mass and no tenderness.  Modest prolongation of expiratory phase.  Lymphadenopathy:    She has no cervical adenopathy.    She has no axillary adenopathy.       Right: No supraclavicular adenopathy present.       Left: No supraclavicular adenopathy present.  Neurological: She is alert and oriented to person, place, and time.  Skin: Skin is warm and dry.    Data Reviewed DIAGNOSIS:  A. 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  - FOCAL SECRETORY CHANGE AND COLUMNAR CELL CHANGE.   Comment:  Three core fragments contain scattered small open tubules associated  with stromal fibrosis in a vaguely infiltrative pattern. The lining  cells are bland, without mitotic activity. The tubules are negative for  p63 by immunohistochemistry (IHC). No atypical ductal hyperplasia is  identified in this sample.   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  Conservative excision of the site is recommended for definitive  characterization.   Estimated volume of tissue resected based on the pathology report is 1.12 cm.  Mammograms of November 28, 2017 through January 01, 2018 were reviewed.  Ultrasound in the same interval was reviewed.   Ultrasound examination of the right breast was  undertaken to determine if preoperative wire localization would be required.  A biopsy cavity is not clearly identified.  Assessment    Abnormal right mammogram and biopsy.  Family history of early onset breast cancer.    Plan  The patient will speak with her mother and determine if she would be willing to have genetic testing completed.  With a diagnosis of breast cancer at age 41 she would certainly meet screening criteria for this exam.  Indications for  conservative excision were discussed.  Plans for wire localization will be undertaken.  Patient was counseled to discontinue smoking.  Please call with any questions or concerns.        HPI, Physical Exam, Assessment and Plan have been scribed under the direction and in the presence of Robert Bellow, MD. Jonnie Finner, CMA  I have completed the exam and reviewed the above documentation for accuracy and completeness.  I agree with the above.  Haematologist has been used and any errors in dictation or transcription are unintentional.  Hervey Ard, M.D., F.A.C.S.  Forest Gleason Ninoska Goswick 01/07/2018, 9:38 AM

## 2018-01-07 NOTE — Patient Instructions (Signed)
Please call with any questions or concerns.

## 2018-01-07 NOTE — Progress Notes (Signed)
The patient is scheduled for surgery at [facility] with Dr Bary Castilla on 02/21/2018. She will pre admit at the hospital and we will call her with that date and time as well as her arrival time and location for surgery.

## 2018-01-08 ENCOUNTER — Telehealth: Payer: Self-pay

## 2018-01-08 NOTE — Telephone Encounter (Signed)
The patient will pre admit at the hospital at the [place] [facility] on 02/13/18 at 8:30 am. She will arrive at the [facility] the morning of her surgery on 02/21/18 at 7:45 am. A message has been left for the patient to call for her arrival times and dates.

## 2018-01-15 NOTE — Telephone Encounter (Signed)
Message left with detailed information for her pre admit date and time.

## 2018-01-22 ENCOUNTER — Telehealth: Payer: Self-pay

## 2018-01-22 NOTE — Telephone Encounter (Signed)
Left message with patient about rescheduling her surgery with Dr Bary Castilla scheduled for 02/21/18. We can rescheduled this to 02/24/18 if she is alright with that.

## 2018-01-24 NOTE — Telephone Encounter (Signed)
2nd message left for patient.

## 2018-01-24 NOTE — Telephone Encounter (Signed)
Spoke with patient about rescheduling her surgery. She is now rescheduled with Dr Bethann Humble at [facility] on 02/28/18. We will call her with her new pre admit date and time and also he new arrival time for surgery.

## 2018-01-30 ENCOUNTER — Telehealth: Payer: Self-pay

## 2018-01-30 NOTE — Telephone Encounter (Signed)
Patient notified her pre admit appointment is on 02/20/18 at 8:45 am. She will arrive at the [facility] breast care center the morning of surgery on 02/28/18 at 9:30 am. The patient is aware of dates, times, and instructions.

## 2018-02-13 ENCOUNTER — Other Ambulatory Visit: Payer: Self-pay

## 2018-02-17 ENCOUNTER — Telehealth: Payer: Self-pay

## 2018-02-17 NOTE — Telephone Encounter (Signed)
Call to patient to let her know that we would need to reschedule her surgery with Dr Bary Castilla for 02/28/18 as he will be on extended leave. She may reschedule with one of our other providers or may waite until Dr Bary Castilla returns. If she will be having another provider complete her surgery she will need to have a pre admit appointment prior to surgery.

## 2018-02-18 NOTE — Telephone Encounter (Addendum)
Spoke with patient about rescheduling her surgery with another provider and she is amendable to this. The patient is rescheduled for surgery at [facility] on 03/03/2018 with Dr Rosana Hoes. She is already scheduled for pre admit testing. She will have a pre op appointment with Dr Rosana Hoes on 02/20/18 at 10:45 am. The morning of surgery she will be reporting to the [facility] breast care center at 7:45 am. The patient is aware of dates, times, and instructions.

## 2018-02-20 ENCOUNTER — Encounter: Payer: Self-pay | Admitting: Surgery

## 2018-02-20 ENCOUNTER — Ambulatory Visit (INDEPENDENT_AMBULATORY_CARE_PROVIDER_SITE_OTHER): Payer: Self-pay | Admitting: Surgery

## 2018-02-20 ENCOUNTER — Other Ambulatory Visit: Payer: Self-pay

## 2018-02-20 ENCOUNTER — Encounter
Admission: RE | Admit: 2018-02-20 | Discharge: 2018-02-20 | Disposition: A | Discharge status: Home / Self Care | Payer: Medicaid Other | Source: Ambulatory Visit | Attending: Surgery | Admitting: Surgery

## 2018-02-20 VITALS — BP 119/70 | HR 101 | Temp 97.9°F | Resp 16 | Ht 62.0 in | Wt 216.0 lb

## 2018-02-20 DIAGNOSIS — Z01818 Encounter for other preprocedural examination: Secondary | ICD-10-CM | POA: Diagnosis present

## 2018-02-20 DIAGNOSIS — N6310 Unspecified lump in the right breast, unspecified quadrant: Secondary | ICD-10-CM

## 2018-02-20 DIAGNOSIS — J449 Chronic obstructive pulmonary disease, unspecified: Secondary | ICD-10-CM

## 2018-02-20 HISTORY — DX: Unspecified osteoarthritis, unspecified site: M19.90

## 2018-02-20 LAB — CBC
HCT: 40.3 % (ref 36.0–46.0)
Hemoglobin: 13.3 g/dL (ref 12.0–15.0)
MCH: 30 pg (ref 26.0–34.0)
MCHC: 33 g/dL (ref 30.0–36.0)
MCV: 91 fL (ref 80.0–100.0)
NRBC: 0 % (ref 0.0–0.2)
PLATELETS: 354 10*3/uL (ref 150–400)
RBC: 4.43 MIL/uL (ref 3.87–5.11)
RDW: 13.4 % (ref 11.5–15.5)
WBC: 10.1 10*3/uL (ref 4.0–10.5)

## 2018-02-20 LAB — BASIC METABOLIC PANEL
Anion gap: 6 (ref 5–15)
BUN: 11 mg/dL (ref 6–20)
CO2: 27 mmol/L (ref 22–32)
Calcium: 8.8 mg/dL — ABNORMAL LOW (ref 8.9–10.3)
Chloride: 105 mmol/L (ref 98–111)
Creatinine, Ser: 0.56 mg/dL (ref 0.44–1.00)
GFR calc Af Amer: >60 mL/min (ref >60)
GFR calc non Af Amer: >60 mL/min (ref >60)
Glucose, Bld: 121 mg/dL — ABNORMAL HIGH (ref 70–99)
POTASSIUM: 3.8 mmol/L (ref 3.5–5.1)
Sodium: 138 mmol/L (ref 135–145)

## 2018-02-20 NOTE — Patient Instructions (Addendum)
The patient is aware to call back for any questions or concerns. Surgery as scheduled   PATIENT INSTRUCTIONS LUMPECTOMY/BREAST BIOPSY  FOLLOW-UP:    Call your physician immediately if you have any fevers greater than 102.5, drainage from you wound that is not clear or looks infected, persistent bleeding, or increasing breast pain not relieved with pain medication.    WOUND CARE INSTRUCTIONS:  Keep a dry clean dressing on the wound if there is drainage. The initial bandage may be removed after 24 hours.  Once the wound has quit draining you may leave it open to air.  If clothing rubs against the wound or causes irritation and the wound is not draining you may cover it with a dry dressing during the daytime.  Try to keep the wound dry and avoid ointments on the wound unless directed to do so.  If the wound becomes bright red and painful or starts to drain infected material that is not clear or slightly blood-tinged, please contact your physician immediately.  If the wound is mildly pink and has a thick firm ridge underneath it, this is normal, and is referred to as a healing ridge.  This will resolve over the next 4-6 weeks.  You may want to wear a sports bra post-operatively to give some additional support as well.  DIET:  You may eat any foods that you can tolerate.  It is a good idea to eat a high fiber diet and take in plenty of fluids to prevent constipation which can commonly be caused by prescription pain medication.  If you do become constipated you may want to take a mild laxative or take ducolax tablets on a daily basis until your bowel habits are regular.    MEDICATIONS:  Try to take narcotic medications and anti-inflammatory medications, such as tylenol, ibuprofen, naprosyn, etc., with food.  This will minimize stomach upset from the medication.  Should you develop nausea and vomiting from the pain medication, or develop a rash, please discontinue the medication and contact your physician.  You  should not drive, make important decisions, or operate machinery when taking narcotic pain medication.  QUESTIONS:  Please feel free to call your physician or the hospital operator if you have any questions, and they will be glad to assist you.   Your pathology results will likely not be available for at least 3-4 business days, depending on the pathology lab. The results will be discussed with you at your follow-up appointment.

## 2018-02-20 NOTE — Patient Instructions (Addendum)
  Your procedure is scheduled on: Monday March 03, 2018 Report to [facility] @ 7:45 am [phone] [place] contact information  Remember: Instructions that are not followed completely may result in serious medical risk, up to and including death, or upon the discretion of your surgeon and anesthesiologist your surgery may need to be rescheduled.    __x__ 1. Do not eat food (including mints, candies, chewing gum) after midnight the night before your procedure. You may drink clear liquids up to 2 hours before you are scheduled to arrive at the hospital for your procedure.  Do not drink anything within 2 hours of your scheduled arrival to the hospital.  Approved clear liquids:  --Water or Apple juice without pulp  --Clear carbohydrate beverage such as Gatorade or Powerade  --Black Coffee or Clear Tea (No milk, no creamers, do not add anything to the coffee or tea)    __x__ 2. No Alcohol for 24 hours before or after surgery.   __x__ 3. No Smoking or e-cigarettes for 24 hours before surgery.  Do not use any chewable tobacco products for at least 6 hours before surgery.   __x__ 4. Notify your doctor if there is any change in your medical condition (cold, fever, infections).   __x__ 5. On the morning of surgery brush your teeth with toothpaste and water.  You may rinse your mouth with mouthwash if you wish.  Do not swallow any toothpaste or mouthwash.  Please read over the following fact sheets that you were given:   [facility] Preparing for Surgery and/or MRSA Information    __x__ Use CHG Soap or Sage wipes as directed on instruction sheet    Do not wear jewelry, make-up, hairpins, clips or nail polish on the day of surgery.  Do not wear lotions, powders, deodorant, or perfumes.   Do not shave below the face/neck 48 hours prior to surgery.   Do not bring valuables to the hospital.    [facility] is not responsible for any belongings or  valuables.               Contacts, dentures or bridgework may not be worn into surgery.  For patients discharged on the day of surgery, you will NOT be permitted to drive yourself home.  You must have a responsible adult with you for 24 hours after surgery.  __x__ Take these medicines on the morning of surgery:  1. NONE  __x__ Follow recommendations from Cardiologist, Pulmonologist or PCP regarding stopping Aspirin, Coumadin, Plavix, Eliquis, Effient, Pradaxa, and Pletal.  __x__ TODAY: Stop Anti-inflammatories such as Advil, Ibuprofen, Motrin, Aleve, Naproxen, Naprosyn, BC/Goodies powders or aspirin products. You may continue to take Tylenol and Celebrex.   __x__ TODAY: Avoid supplements until after surgery. You may continue to take Vitamin D, Vitamin B, and multivitamin.

## 2018-02-20 NOTE — Progress Notes (Signed)
Surgical Clinic Progress/Follow-up Note   HPI:  53 y.o. Female presents to clinic for follow-up evaluation of Right breast mass. Patient reports this was noted on her screening bilateral mammogram (11/27/2017) that she underwent after having developed unilateral Right breast pain and yellow (non-bloody) Right nipple drainage, as well as on subsequent diagnostic Right mammogram and focused ultrasound (12/09/2017), followed by stereotactic Right breast core needle biopsy (12/25/2017). Patient was evaluated by Dr. Bary Castilla (01/07/2018) and scheduled for excisional Right breast biopsy (02/21/2018, rescheduled for 02/28/2018) and presents today for pre-operative follow-up. Patient currently denies breast pain, nipple discharge, palpable mass, fever/chills, unintentional weight loss, CP, or SOB.  Review of Systems:  Constitutional: denies any other weight loss, fever, chills, or sweats  Eyes: denies any other vision changes, history of eye injury  ENT: denies sore throat, hearing problems  Respiratory: denies shortness of breath, wheezing  Cardiovascular: denies chest pain, palpitations Breasts: pain, mass(es), and nipple discharge as per interval history Gastrointestinal: denies abdominal pain, N/V, or diarrhea Musculoskeletal: denies any other joint pains or cramps  Skin: Denies any other rashes or skin discolorations  Neurological: denies any other headache, dizziness, weakness  Psychiatric: denies any other depression, anxiety  All other review of systems: otherwise negative   Vital Signs:  BP 119/70   Pulse (!) 101   Temp 97.9 F (36.6 C) (Skin)   Resp 16   Ht 5\' 2"  (1.575 m)   Wt 216 lb (98 kg)   SpO2 97%   BMI 39.51 kg/m    Physical Exam:  Constitutional:  -- Obese body habitus  -- Awake, alert, and oriented x3  Eyes:  -- Pupils equally round and reactive to light  -- No scleral icterus  Ear, nose, throat:  -- No jugular venous distension  -- No nasal drainage,  bleeding Pulmonary:  -- No crackles -- Equal breath sounds bilaterally -- Breathing non-labored at rest Cardiovascular:  -- S1, S2 present  -- No pericardial rubs  Breasts: -- B/L NT with no palpable mass(es) or nipple discharge -- No appreciable axillary or supraclavicular lymphadenopathy Gastrointestinal:  -- Soft, nontender, non-distended, no guarding/rebound  -- No abdominal masses appreciated, pulsatile or otherwise  Musculoskeletal / Integumentary:  -- Wounds or skin discoloration: None appreciated  -- Extremities: B/L UE and LE FROM, hands and feet warm, no edema  Neurologic:  -- Motor function: intact and symmetric  -- Sensation: intact and symmetric   Laboratory studies:  CBC Latest Ref Rng & Units 02/20/2018 02/24/2016  WBC 4.0 - 10.5 K/uL 10.1 14.9(H)  Hemoglobin 12.0 - 15.0 g/dL 13.3 14.1  Hematocrit 36.0 - 46.0 % 40.3 41.7  Platelets 150 - 400 K/uL 354 372   CMP Latest Ref Rng & Units 02/20/2018 02/24/2016  Glucose 70 - 99 mg/dL 121(H) 203(H)  BUN 6 - 20 mg/dL 11 20  Creatinine 0.44 - 1.00 mg/dL 0.56 0.64  Sodium 135 - 145 mmol/L 138 136  Potassium 3.5 - 5.1 mmol/L 3.8 4.1  Chloride 98 - 111 mmol/L 105 101  CO2 22 - 32 mmol/L 27 24  Calcium 8.9 - 10.3 mg/dL 8.8(L) 9.4   Imaging:  Bilateral Screening Mammogram (11/27/2017) ACR Breast Density Category c: The breast tissue is  heterogeneously dense, which may obscure small masses. Possible Right breast distortion warrants further evaluation.  No findings suspicious for Left breast malignancy.  Right Diagnostic Mammogram and Focused Ultrasound (12/09/2017) Focal architectural distortion persists in the Right upper outer Breast, seen on slice 39/03 of the spot compression MLO  view and slice 34/19 on the spot compression CC view. This distortion spans approximately 1 cm. On a 90 degree lateral view, this distortion is Subtle, but it is suspected to be on slice 62/22.  Targeted ultrasound shows no visible  architectural distortion  or mass in the upper-outer right breast to correspond to the small  area of architectural distortion seen on the mammogram.  Ultrasound of the right axilla is negative for lymphadenopathy.  BI-RADS CATEGORY  4: Suspicious.  Right Breast Ultrasound ([facility], 01/07/2018) Ultrasound examination of the right breast was undertaken to determine if preoperative wire localization would be required. A biopsy cavity is not clearly identified.  Pathology:  Stereotactic Right Breast Core Needle Biopsy Pathology (12/25/2017) A. 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  - FOCAL SECRETORY CHANGE AND COLUMNAR CELL CHANGE.   3 core fragments contain scattered small open tubules associated  with stromal fibrosis in a vaguely infiltrative pattern. The lining  cells are bland, without mitotic activity. The tubules are negative for  p63 by immunohistochemistry (IHC). No atypical ductal hyperplasia is  identified in this sample.   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  Conservative excision of the site is recommended for definitive  characterization.  Assessment:  53 y.o. 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  Plan:   - imaging and pathology results discussed   - all risks, benefits, and alternatives to excisional biopsy of Right breast mass with wire-localization (without sentinel lymph node biopsy due to currently no definitive cancer or DCIS diagnosis) were discussed with the patient, all of her questions were  answered to her expressed satisfaction, patient expresses she wishes to proceed, and informed consent was obtained.  - will plan to proceed with Right breast lumpectomy/excisional biopsy with wire-localization without sentinel lymph node biopsy on 1/24 pending anesthesia and OR availability  - anticipate return to clinic 2 weeks following above planned procedure  - instructed to call office if any questions or concerns  All of the above recommendations were discussed with the patient, and all of patient's questions were answered to her expressed satisfaction.  -- Margaret Blackburn Rosana Hoes, MD, [place]: [facility] - Partnering for exceptional care. Office: [phone]

## 2018-02-20 NOTE — H&P (View-Only) (Signed)
Surgical Clinic Progress/Follow-up Note   HPI:  53 y.o. Female presents to clinic for follow-up evaluation of Right breast mass. Patient reports this was noted on her screening bilateral mammogram (11/27/2017) that she underwent after having developed unilateral Right breast pain and yellow (non-bloody) Right nipple drainage, as well as on subsequent diagnostic Right mammogram and focused ultrasound (12/09/2017), followed by stereotactic Right breast core needle biopsy (12/25/2017). Patient was evaluated by Dr. Bary Castilla (01/07/2018) and scheduled for excisional Right breast biopsy (02/21/2018, rescheduled for 02/28/2018) and presents today for pre-operative follow-up. Patient currently denies breast pain, nipple discharge, palpable mass, fever/chills, unintentional weight loss, CP, or SOB.  Review of Systems:  Constitutional: denies any other weight loss, fever, chills, or sweats  Eyes: denies any other vision changes, history of eye injury  ENT: denies sore throat, hearing problems  Respiratory: denies shortness of breath, wheezing  Cardiovascular: denies chest pain, palpitations Breasts: pain, mass(es), and nipple discharge as per interval history Gastrointestinal: denies abdominal pain, N/V, or diarrhea Musculoskeletal: denies any other joint pains or cramps  Skin: Denies any other rashes or skin discolorations  Neurological: denies any other headache, dizziness, weakness  Psychiatric: denies any other depression, anxiety  All other review of systems: otherwise negative   Vital Signs:  BP 119/70   Pulse (!) 101   Temp 97.9 F (36.6 C) (Skin)   Resp 16   Ht 5\' 2"  (1.575 m)   Wt 216 lb (98 kg)   SpO2 97%   BMI 39.51 kg/m    Physical Exam:  Constitutional:  -- Obese body habitus  -- Awake, alert, and oriented x3  Eyes:  -- Pupils equally round and reactive to light  -- No scleral icterus  Ear, nose, throat:  -- No jugular venous distension  -- No nasal drainage,  bleeding Pulmonary:  -- No crackles -- Equal breath sounds bilaterally -- Breathing non-labored at rest Cardiovascular:  -- S1, S2 present  -- No pericardial rubs  Breasts: -- B/L NT with no palpable mass(es) or nipple discharge -- No appreciable axillary or supraclavicular lymphadenopathy Gastrointestinal:  -- Soft, nontender, non-distended, no guarding/rebound  -- No abdominal masses appreciated, pulsatile or otherwise  Musculoskeletal / Integumentary:  -- Wounds or skin discoloration: None appreciated  -- Extremities: B/L UE and LE FROM, hands and feet warm, no edema  Neurologic:  -- Motor function: intact and symmetric  -- Sensation: intact and symmetric   Laboratory studies:  CBC Latest Ref Rng & Units 02/20/2018 02/24/2016  WBC 4.0 - 10.5 K/uL 10.1 14.9(H)  Hemoglobin 12.0 - 15.0 g/dL 13.3 14.1  Hematocrit 36.0 - 46.0 % 40.3 41.7  Platelets 150 - 400 K/uL 354 372   CMP Latest Ref Rng & Units 02/20/2018 02/24/2016  Glucose 70 - 99 mg/dL 121(H) 203(H)  BUN 6 - 20 mg/dL 11 20  Creatinine 0.44 - 1.00 mg/dL 0.56 0.64  Sodium 135 - 145 mmol/L 138 136  Potassium 3.5 - 5.1 mmol/L 3.8 4.1  Chloride 98 - 111 mmol/L 105 101  CO2 22 - 32 mmol/L 27 24  Calcium 8.9 - 10.3 mg/dL 8.8(L) 9.4   Imaging:  Bilateral Screening Mammogram (11/27/2017) ACR Breast Density Category c: The breast tissue is  heterogeneously dense, which may obscure small masses. Possible Right breast distortion warrants further evaluation.  No findings suspicious for Left breast malignancy.  Right Diagnostic Mammogram and Focused Ultrasound (12/09/2017) Focal architectural distortion persists in the Right upper outer Breast, seen on slice 08/65 of the spot compression MLO  view and slice 16/10 on the spot compression CC view. This distortion spans approximately 1 cm. On a 90 degree lateral view, this distortion is Subtle, but it is suspected to be on slice 96/04.  Targeted ultrasound shows no visible  architectural distortion  or mass in the upper-outer right breast to correspond to the small  area of architectural distortion seen on the mammogram.  Ultrasound of the right axilla is negative for lymphadenopathy.  BI-RADS CATEGORY  4: Suspicious.  Right Breast Ultrasound ([facility], 01/07/2018) Ultrasound examination of the right breast was undertaken to determine if preoperative wire localization would be required. A biopsy cavity is not clearly identified.  Pathology:  Stereotactic Right Breast Core Needle Biopsy Pathology (12/25/2017) A. 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  - FOCAL SECRETORY CHANGE AND COLUMNAR CELL CHANGE.   3 core fragments contain scattered small open tubules associated  with stromal fibrosis in a vaguely infiltrative pattern. The lining  cells are bland, without mitotic activity. The tubules are negative for  p63 by immunohistochemistry (IHC). No atypical ductal hyperplasia is  identified in this sample.   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  Conservative excision of the site is recommended for definitive  characterization.  Assessment:  53 y.o. 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  Plan:   - imaging and pathology results discussed   - all risks, benefits, and alternatives to excisional biopsy of Right breast mass with wire-localization (without sentinel lymph node biopsy due to currently no definitive cancer or DCIS diagnosis) were discussed with the patient, all of her questions were  answered to her expressed satisfaction, patient expresses she wishes to proceed, and informed consent was obtained.  - will plan to proceed with Right breast lumpectomy/excisional biopsy with wire-localization without sentinel lymph node biopsy on 1/24 pending anesthesia and OR availability  - anticipate return to clinic 2 weeks following above planned procedure  - instructed to call office if any questions or concerns  All of the above recommendations were discussed with the patient, and all of patient's questions were answered to her expressed satisfaction.  -- [name] Rosana Hoes, MD, [place]: [facility] - Partner[facility] for exceptional care. Office: [phone]

## 2018-02-21 ENCOUNTER — Ambulatory Visit: Payer: Medicaid Other

## 2018-02-24 DIAGNOSIS — N6310 Unspecified lump in the right breast, unspecified quadrant: Secondary | ICD-10-CM | POA: Insufficient documentation

## 2018-02-28 ENCOUNTER — Ambulatory Visit: Payer: Medicaid Other

## 2018-03-03 ENCOUNTER — Encounter: Payer: Self-pay | Admitting: *Deleted

## 2018-03-03 ENCOUNTER — Ambulatory Visit
Admission: RE | Admit: 2018-03-03 | Discharge: 2018-03-03 | Disposition: A | Discharge status: Home / Self Care | Payer: Medicaid Other | Source: Ambulatory Visit | Attending: General Surgery | Admitting: General Surgery

## 2018-03-03 ENCOUNTER — Ambulatory Visit: Payer: Medicaid Other | Admitting: Anesthesiology

## 2018-03-03 ENCOUNTER — Encounter: Payer: Self-pay | Admitting: Anesthesiology

## 2018-03-03 ENCOUNTER — Ambulatory Visit
Admission: RE | Admit: 2018-03-03 | Discharge: 2018-03-03 | Disposition: A | Discharge status: Home / Self Care | Payer: Medicaid Other | Source: Ambulatory Visit | Attending: Surgery | Admitting: Surgery

## 2018-03-03 ENCOUNTER — Encounter
Admission: RE | Disposition: A | Discharge status: Home / Self Care | Payer: Self-pay | Source: Ambulatory Visit | Attending: Surgery

## 2018-03-03 ENCOUNTER — Other Ambulatory Visit: Payer: Self-pay

## 2018-03-03 DIAGNOSIS — N6310 Unspecified lump in the right breast, unspecified quadrant: Secondary | ICD-10-CM

## 2018-03-03 DIAGNOSIS — Z6839 Body mass index (BMI) 39.0-39.9, adult: Secondary | ICD-10-CM | POA: Insufficient documentation

## 2018-03-03 DIAGNOSIS — J449 Chronic obstructive pulmonary disease, unspecified: Secondary | ICD-10-CM | POA: Diagnosis not present

## 2018-03-03 DIAGNOSIS — C50411 Malignant neoplasm of upper-outer quadrant of right female breast: Secondary | ICD-10-CM | POA: Insufficient documentation

## 2018-03-03 DIAGNOSIS — C50911 Malignant neoplasm of unspecified site of right female breast: Secondary | ICD-10-CM

## 2018-03-03 DIAGNOSIS — G4730 Sleep apnea, unspecified: Secondary | ICD-10-CM | POA: Diagnosis not present

## 2018-03-03 HISTORY — PX: BREAST LUMPECTOMY: SHX2

## 2018-03-03 HISTORY — PX: BREAST LUMPECTOMY WITH NEEDLE LOCALIZATION: SHX5759

## 2018-03-03 LAB — URINE DRUG SCREEN, QUALITATIVE (ARMC ONLY)
Amphetamines, Ur Screen: NOT DETECTED
BENZODIAZEPINE, UR SCRN: NOT DETECTED
Barbiturates, Ur Screen: NOT DETECTED
Cannabinoid 50 Ng, Ur ~~LOC~~: POSITIVE — AB
Cocaine Metabolite,Ur ~~LOC~~: NOT DETECTED
MDMA (Ecstasy)Ur Screen: NOT DETECTED
Methadone Scn, Ur: NOT DETECTED
Opiate, Ur Screen: NOT DETECTED
Phencyclidine (PCP) Ur S: NOT DETECTED
Tricyclic, Ur Screen: NOT DETECTED

## 2018-03-03 SURGERY — BREAST LUMPECTOMY WITH NEEDLE LOCALIZATION
Anesthesia: General | Laterality: Right

## 2018-03-03 MED ORDER — BUPIVACAINE HCL (PF) 0.5 % IJ SOLN
INTRAMUSCULAR | Status: AC
  Filled 2018-03-03: qty 30

## 2018-03-03 MED ORDER — PROPOFOL 10 MG/ML IV BOLUS
INTRAVENOUS | Status: AC
  Filled 2018-03-03: qty 20

## 2018-03-03 MED ORDER — FENTANYL CITRATE (PF) 100 MCG/2ML IJ SOLN
INTRAMUSCULAR | Status: AC
  Filled 2018-03-03: qty 2

## 2018-03-03 MED ORDER — OXYCODONE-ACETAMINOPHEN 5-325 MG PO TABS: 1.0000 | ORAL_TABLET | ORAL | 0 refills | Status: DC | PRN

## 2018-03-03 MED ORDER — MIDAZOLAM HCL 2 MG/2ML IJ SOLN
INTRAMUSCULAR | Status: AC
  Filled 2018-03-03: qty 2

## 2018-03-03 MED ORDER — CEFAZOLIN SODIUM-DEXTROSE 2-4 GM/100ML-% IV SOLN
2.0000 g | Freq: Once | INTRAVENOUS | Status: AC
  Administered 2018-03-03: 2 g via INTRAVENOUS

## 2018-03-03 MED ORDER — DEXAMETHASONE SODIUM PHOSPHATE 10 MG/ML IJ SOLN
INTRAMUSCULAR | Status: DC | PRN
  Administered 2018-03-03: 10 mg via INTRAVENOUS

## 2018-03-03 MED ORDER — FENTANYL CITRATE (PF) 100 MCG/2ML IJ SOLN
INTRAMUSCULAR | Status: DC | PRN
  Administered 2018-03-03: 50 ug via INTRAVENOUS
  Administered 2018-03-03: 100 ug via INTRAVENOUS
  Administered 2018-03-03: 50 ug via INTRAVENOUS

## 2018-03-03 MED ORDER — LACTATED RINGERS IV SOLN
INTRAVENOUS | Status: DC
  Administered 2018-03-03: 10:00:00 via INTRAVENOUS

## 2018-03-03 MED ORDER — LIDOCAINE HCL (PF) 1 % IJ SOLN
INTRAMUSCULAR | Status: AC
  Filled 2018-03-03: qty 30

## 2018-03-03 MED ORDER — FAMOTIDINE 20 MG PO TABS
ORAL_TABLET | ORAL | Status: AC
  Filled 2018-03-03: qty 1

## 2018-03-03 MED ORDER — ONDANSETRON HCL 4 MG/2ML IJ SOLN
INTRAMUSCULAR | Status: AC
  Filled 2018-03-03: qty 2

## 2018-03-03 MED ORDER — IPRATROPIUM-ALBUTEROL 0.5-2.5 (3) MG/3ML IN SOLN
RESPIRATORY_TRACT | Status: AC
  Filled 2018-03-03: qty 3

## 2018-03-03 MED ORDER — LIDOCAINE-EPINEPHRINE (PF) 1 %-1:200000 IJ SOLN
INTRAMUSCULAR | Status: AC
  Filled 2018-03-03: qty 30

## 2018-03-03 MED ORDER — PROMETHAZINE HCL 25 MG/ML IJ SOLN: 6.2500 mg | INTRAMUSCULAR | Status: DC | PRN

## 2018-03-03 MED ORDER — CEFAZOLIN SODIUM-DEXTROSE 2-4 GM/100ML-% IV SOLN
INTRAVENOUS | Status: AC
  Filled 2018-03-03: qty 100

## 2018-03-03 MED ORDER — ONDANSETRON HCL 4 MG/2ML IJ SOLN
INTRAMUSCULAR | Status: DC | PRN
  Administered 2018-03-03: 4 mg via INTRAVENOUS

## 2018-03-03 MED ORDER — FAMOTIDINE 20 MG PO TABS
20.0000 mg | ORAL_TABLET | Freq: Once | ORAL | Status: AC
  Administered 2018-03-03: 20 mg via ORAL

## 2018-03-03 MED ORDER — LIDOCAINE HCL 1 % IJ SOLN
INTRAMUSCULAR | Status: DC | PRN
  Administered 2018-03-03: 10 mL

## 2018-03-03 MED ORDER — FENTANYL CITRATE (PF) 100 MCG/2ML IJ SOLN
25.0000 ug | INTRAMUSCULAR | Status: DC | PRN
  Administered 2018-03-03: 50 ug via INTRAVENOUS

## 2018-03-03 MED ORDER — MIDAZOLAM HCL 2 MG/2ML IJ SOLN
INTRAMUSCULAR | Status: DC | PRN
  Administered 2018-03-03: 2 mg via INTRAVENOUS

## 2018-03-03 MED ORDER — BUPIVACAINE HCL (PF) 0.5 % IJ SOLN
INTRAMUSCULAR | Status: DC | PRN
  Administered 2018-03-03: 10 mL

## 2018-03-03 MED ORDER — LIDOCAINE HCL (CARDIAC) PF 100 MG/5ML IV SOSY
PREFILLED_SYRINGE | INTRAVENOUS | Status: DC | PRN
  Administered 2018-03-03: 100 mg via INTRAVENOUS

## 2018-03-03 MED ORDER — PROPOFOL 10 MG/ML IV BOLUS
INTRAVENOUS | Status: DC | PRN
  Administered 2018-03-03: 200 mg via INTRAVENOUS

## 2018-03-03 SURGICAL SUPPLY — 34 items
BINDER BREAST XLRG (GAUZE/BANDAGES/DRESSINGS) ×2 IMPLANT
BLADE SURG 15 STRL LF DISP TIS (BLADE) ×1 IMPLANT
BLADE SURG 15 STRL SS (BLADE) ×1
CANISTER SUCT 1200ML W/VALVE (MISCELLANEOUS) ×2 IMPLANT
CHLORAPREP W/TINT 26ML (MISCELLANEOUS) ×2 IMPLANT
COVER PROBE FLX POLY STRL (MISCELLANEOUS) ×2 IMPLANT
COVER WAND RF STERILE (DRAPES) IMPLANT
DERMABOND ADVANCED (GAUZE/BANDAGES/DRESSINGS) ×1
DERMABOND ADVANCED .7 DNX12 (GAUZE/BANDAGES/DRESSINGS) ×1 IMPLANT
DEVICE DUBIN SPECIMEN MAMMOGRA (MISCELLANEOUS) ×2 IMPLANT
DRAPE LAPAROTOMY TRNSV 106X77 (MISCELLANEOUS) ×2 IMPLANT
DRSG GAUZE FLUFF 36X18 (GAUZE/BANDAGES/DRESSINGS) ×2 IMPLANT
ELECT CAUTERY BLADE 6.4 (BLADE) ×2 IMPLANT
ELECT REM PT RETURN 9FT ADLT (ELECTROSURGICAL) ×2
ELECTRODE REM PT RTRN 9FT ADLT (ELECTROSURGICAL) ×1 IMPLANT
GLOVE BIO SURGEON STRL SZ7 (GLOVE) ×8 IMPLANT
GLOVE INDICATOR 7.5 STRL GRN (GLOVE) ×8 IMPLANT
GOWN STRL REUS W/ TWL LRG LVL3 (GOWN DISPOSABLE) ×3 IMPLANT
GOWN STRL REUS W/ TWL XL LVL3 (GOWN DISPOSABLE) ×1 IMPLANT
GOWN STRL REUS W/TWL LRG LVL3 (GOWN DISPOSABLE) ×3
GOWN STRL REUS W/TWL XL LVL3 (GOWN DISPOSABLE) ×1
LABEL OR SOLS (LABEL) ×2 IMPLANT
MARGIN MAP 10MM (MISCELLANEOUS) IMPLANT
NEEDLE HYPO 25X1 1.5 SAFETY (NEEDLE) ×2 IMPLANT
PACK BASIN MINOR ARMC (MISCELLANEOUS) ×2 IMPLANT
SUT MNCRL 4-0 (SUTURE) ×1
SUT MNCRL 4-0 27XMFL (SUTURE) ×1
SUT SILK 2 0 SH (SUTURE) ×2 IMPLANT
SUT VIC AB 3-0 SH 27 (SUTURE) ×1
SUT VIC AB 3-0 SH 27X BRD (SUTURE) ×1 IMPLANT
SUTURE MNCRL 4-0 27XMF (SUTURE) ×1 IMPLANT
SYR 10ML LL (SYRINGE) ×2 IMPLANT
SYR BULB IRRIG 60ML STRL (SYRINGE) ×2 IMPLANT
WATER STERILE IRR 1000ML POUR (IV SOLUTION) ×2 IMPLANT

## 2018-03-03 NOTE — OR Nursing (Signed)
Patient reports she had blisters when she had PCN. She told this to Dr. Rosana Hoes and he wants patient to get ancef preop for this procedure.

## 2018-03-03 NOTE — Anesthesia Post-op Follow-up Note (Signed)
Anesthesia QCDR form completed.        

## 2018-03-03 NOTE — Discharge Instructions (Addendum)
AMBULATORY SURGERY  DISCHARGE INSTRUCTIONS   1) The drugs that you were given will stay in your system until tomorrow so for the next 24 hours you should not:  A) Drive an automobile B) Make any legal decisions C) Drink any alcoholic beverage   2) You may resume regular meals tomorrow.  Today it is better to start with liquids and gradually work up to solid foods.  You may eat anything you prefer, but it is better to start with liquids, then soup and crackers, and gradually work up to solid foods.   3) Please notify your doctor immediately if you have any unusual bleeding, trouble breathing, redness and pain at the surgery site, drainage, fever, or pain not relieved by medication.    4) Additional Instructions:Please call for follow up appt,         Please contact your physician with any problems or Same Day Surgery at [phone], Monday through Friday 6 am to 4 pm, or [facility] number at [phone].In addition to included general post-operative instructions for Right Breast Lumpectomy,  Diet: Resume home heart healthy diet.   Activity: No heavy lifting >15 - 20 pounds (children, pets, laundry, garbage) or strenuous activity until follow-up, but light activity and walking are encouraged. Do not drive or drink alcohol if taking narcotic pain medications.  Wound care: 2 days after surgery (Wednesday, 1/29), you may shower/get incision wet with soapy water and pat dry (do not rub incisions), but no baths or submerging incision underwater until follow-up.   Medications: Resume all home medications. For mild to moderate pain: acetaminophen (Tylenol) or ibuprofen/naproxen (if no kidney disease). Combining Tylenol with alcohol can substantially increase your risk of causing liver disease. Narcotic pain medications, if prescribed, can be used for severe pain, though may cause nausea, constipation, and drowsiness. Do not combine Tylenol and Percocet (or similar)  within a 6 hour period as Percocet (and similar) contain(s) Tylenol. If you do not need the narcotic pain medication, you do not need to fill the prescription.  Call office [phone]) at any time if any questions, worsening pain, fevers/chills, bleeding, drainage from incision site, or other concerns.

## 2018-03-03 NOTE — Interval H&P Note (Signed)
History and Physical Interval Note:  03/03/2018 9:47 AM  Margaret Blackburn  has presented today for surgery, with the diagnosis of RIGHT BREAST MASS  The various methods of treatment have been discussed with the patient and family. After consideration of risks, benefits and other options for treatment, the patient has consented to  Procedure(s): BREAST LUMPECTOMY WITH NEEDLE LOCALIZATION (Right) as a surgical intervention .  The patient's history has been reviewed, patient examined, no change in status, stable for surgery.  I have reviewed the patient's chart and labs.  Questions were answered to the patient's satisfaction.     Vickie Epley

## 2018-03-03 NOTE — Transfer of Care (Signed)
Immediate Anesthesia Transfer of Care Note  Patient: Margaret Blackburn  Procedure(s) Performed: BREAST LUMPECTOMY WITH NEEDLE LOCALIZATION (Right )  Patient Location: PACU  Anesthesia Type:General  Level of Consciousness: sedated  Airway & Oxygen Therapy: Patient Spontanous Breathing and Patient connected to face mask oxygen  Post-op Assessment: Report given to RN and Post -op Vital signs reviewed and stable  Post vital signs: Reviewed and stable  Last Vitals:  Vitals Value Taken Time  BP 165/89 03/03/2018 12:01 PM  Temp    Pulse 86 03/03/2018 12:07 PM  Resp 21 03/03/2018 12:07 PM  SpO2 96 % 03/03/2018 12:07 PM  Vitals shown include unvalidated device data.  Last Pain:  Vitals:   03/03/18 0649  TempSrc: Tympanic  PainSc: 0-No pain         Complications: No apparent anesthesia complications

## 2018-03-03 NOTE — Anesthesia Preprocedure Evaluation (Signed)
Anesthesia Evaluation  Patient identified by MRN, date of birth, ID band Patient awake    Reviewed: Allergy & Precautions, H&P , NPO status , Patient's Chart, lab work & pertinent test results, reviewed documented beta blocker date and time   History of Anesthesia Complications Negative for: history of anesthetic complications  Airway Mallampati: III  TM Distance: >3 FB Neck ROM: full    Dental  (+) Dental Advidsory Given, Poor Dentition, Partial Lower, Missing   Pulmonary shortness of breath and with exertion, sleep apnea , COPD, neg recent URI, Current Smoker,           Cardiovascular Exercise Tolerance: Good negative cardio ROS       Neuro/Psych negative neurological ROS  negative psych ROS   GI/Hepatic Neg liver ROS, GERD  ,  Endo/Other  neg diabetesMorbid obesity  Renal/GU negative Renal ROS  negative genitourinary   Musculoskeletal   Abdominal   Peds  Hematology negative hematology ROS (+)   Anesthesia Other Findings Past Medical History: No date: Arthritis No date: COPD (chronic obstructive pulmonary disease) (HCC)   Reproductive/Obstetrics negative OB ROS                             Anesthesia Physical Anesthesia Plan  ASA: III  Anesthesia Plan: General   Post-op Pain Management:    Induction: Intravenous  PONV Risk Score and Plan: 2 and Ondansetron, Dexamethasone, Midazolam, Promethazine and Treatment may vary due to age or medical condition  Airway Management Planned: LMA and Oral ETT  Additional Equipment:   Intra-op Plan:   Post-operative Plan: Extubation in OR  Informed Consent: I have reviewed the patients History and Physical, chart, labs and discussed the procedure including the risks, benefits and alternatives for the proposed anesthesia with the patient or authorized representative who has indicated his/her understanding and acceptance.     Dental  Advisory Given  Plan Discussed with: Anesthesiologist, CRNA and Surgeon  Anesthesia Plan Comments:         Anesthesia Quick Evaluation

## 2018-03-03 NOTE — Progress Notes (Signed)
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, for which excision was advised (icd-10: C50.151)   PROCEDURE(S):  1.) Right partial mastectomy/lumpectomy with image-guided wire localization (cpt: 89211)   INTRAOPERATIVE FINDINGS: Wire-localized mammary tissue removed in its entirety (no palpable mass appreciated) including biopsy-associated clip, confirmed central within specimen intra-operatively by radiology   INTRAVENOUS FLUIDS: 800 mL crystalloid    ESTIMATED BLOOD LOSS: Minimal (<20 mL)   URINE OUTPUT: No Foley    SPECIMENS: Right breast lumpectomy/partial mastectomy (oriented with long lateral silk suture, short superior silk suture, and double suture to deep margin)   IMPLANTS: None   DRAINS: None   COMPLICATIONS: None apparent   CONDITION AT END OF PROCEDURE: Hemodynamically stable and extubated   DISPOSITION OF PATIENT: PACU   INDICATIONS FOR PROCEDURE:  53 y.o. female presented for evaluation of abnormal Right breast diagnostic mammogram with focal breast ultrasound and abnormal core needle biopsy findings, for which excision was advised. All risks, benefits, and alternatives to lumpectomy vs mastectomy were discussed with the patient, all of patient's questions were answered to her expressed satisfaction, patient elected to proceed with lumpectomy/partial mastectomy, and informed consent was accordingly obtained and documented at that time.   DETAILS OF PROCEDURE: Pre-operative needle-/wire-localization was performed by radiology, and localization studies were reviewed. Patient was brought  to the operating suite and appropriately identified. General anesthesia was administered along with appropriate pre-operative antibiotics, and placement of LMA was performed by anesthetist. In supine position, operative site was prepped and draped in the usual sterile fashion, and following a brief time out, local anesthetic was then injected, and a lateral 2.5 - 3 cm circumareolar incision was made using a #15 blade scalpel, around which appropriate thickness skin flaps were created and excision was continued deep towards chest wall to include the localization wire/needle in its entirety with appropriately wide margins. Long lateral suture, short superior, and double deep 2-0 silk sutures were used to orient the specimen, which was then handed off the field for radiographic assessment and pathology processing. Hemostasis was confirmed, and the wound was re-approximated in layers using buried interrupted 3-0 Vicryl for dermis. Radiographic confirmation that the entire lesion was excised -- including the localizing wire and clip -- was received prior to completion of closure. Skin was then cleaned and dried, and sterile Dermabond skin glue was applied and allowed to dry.   Patient was safely able to be extubated, awakened, and transferred to PACU for post-operative monitoring and care.   I was present for all aspects of the above procedure, and no operative complications were apparent.

## 2018-03-04 ENCOUNTER — Encounter: Payer: Self-pay | Admitting: Surgery

## 2018-03-04 NOTE — Anesthesia Postprocedure Evaluation (Signed)
Anesthesia Post Note  Patient: Margaret Blackburn  Procedure(s) Performed: BREAST LUMPECTOMY WITH NEEDLE LOCALIZATION (Right )  Patient location during evaluation: PACU Anesthesia Type: General Level of consciousness: awake and alert Pain management: pain level controlled Vital Signs Assessment: post-procedure vital signs reviewed and stable Respiratory status: spontaneous breathing, nonlabored ventilation, respiratory function stable and patient connected to nasal cannula oxygen Cardiovascular status: blood pressure returned to baseline and stable Postop Assessment: no apparent nausea or vomiting Anesthetic complications: no     Last Vitals:  Vitals:   03/03/18 1303 03/03/18 1322  BP: 132/77 130/79  Pulse: 88 86  Resp: 18 18  Temp: (!) 36.4 C   SpO2: 92% 94%    Last Pain:  Vitals:   03/03/18 1322  TempSrc:   PainSc: 0-No pain                 Martha Clan

## 2018-03-04 NOTE — Op Note (Signed)
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, for which excision was advised(icd-10: C50.151)  PROCEDURE(S):  1.) Right partial mastectomy/lumpectomy with image-guided wire localization(cpt: 09628)  INTRAOPERATIVE FINDINGS: Wire-localized mammary tissue removed in its entirety (no palpable mass appreciated) including biopsy-associated clip, confirmedcentral within specimenintra-operatively by radiology  INTRAVENOUS FLUIDS: 829mL crystalloid   ESTIMATED BLOOD LOSS: Minimal (<32mL)  URINE OUTPUT: No Foley  SPECIMENS:Right breast lumpectomy/partial mastectomy (oriented with long lateral silk suture, short superior silk suture, and double suture to deep margin)  IMPLANTS: None  DRAINS: None  COMPLICATIONS: None apparent  CONDITION AT END OF PROCEDURE: Hemodynamically stable and extubated  DISPOSITION OF PATIENT: PACU  INDICATIONS FOR PROCEDURE: 53 y.o. femalepresentedfor evaluation of abnormal Right breast diagnostic mammogram with focal breast ultrasound and abnormal core needle biopsy findings, for which excision was advised.All risks, benefits, and alternatives to lumpectomyvs mastectomy were discussed with the patient, all of patient's questions were answered to her expressed satisfaction, patient elected to proceed with lumpectomy/partial mastectomy, and informed consent was accordingly obtained and documented at that time.  DETAILS OF PROCEDURE: Pre-operative needle-/wire-localization was performed by radiology, and localization studies were reviewed. Patient was brought  to the operating suite and appropriately identified. General anesthesia was administered along with appropriate pre-operative antibiotics, and placement of LMA was performed by anesthetist. In supine position, operative site was prepped and draped in the usual sterile fashion, and following a brief time out, local anesthetic was then injected, and a lateral 2.5 - 3 cm circumareolar incision was made using a #15 blade scalpel, around which appropriate thickness skin flaps were created and excision was continued deep towards chest wall to include the localization wire/needle in its entirety with appropriately wide margins. Long lateral suture, short superior, and double deep 2-0 silk sutures were used to orient the specimen, which was then handed off the field for radiographic assessment and pathology processing. Hemostasis was confirmed, and the wound was re-approximated in layers using buried interrupted 3-0 Vicryl for dermis. Radiographic confirmation that the entire lesion was excised -- including the localizing wire and clip -- was received prior to completion of closure.Skin was then cleaned and dried, and sterileDermabondskin glue was applied and allowed to dry.   Patient was safely able to be extubated, awakened, and transferred to PACU for post-operative monitoring and care.  I was present for all aspects of the above procedure, and no operative complications were apparent.

## 2018-03-06 NOTE — Addendum Note (Signed)
Addendum  created 03/06/18 1056 by Doreen Salvage, CRNA   Charge Capture section accepted

## 2018-03-10 ENCOUNTER — Telehealth: Payer: Self-pay | Admitting: *Deleted

## 2018-03-10 ENCOUNTER — Other Ambulatory Visit: Payer: Self-pay | Admitting: Anatomic Pathology & Clinical Pathology

## 2018-03-10 LAB — SURGICAL PATHOLOGY

## 2018-03-10 NOTE — Telephone Encounter (Signed)
Al Pimple called the office wanting to know if Dr. Rosana Hoes will be calling patient with pathology results or if he would like her to call.   Patient is not scheduled to see Dr. Rosana Hoes for folow up until next week.  Per Webb Silversmith, she needs to call the patient anyway about applying for [facility] Medicaid. Anne's number is [phone].  Message routed to Dr. Rosana Hoes.

## 2018-03-11 DIAGNOSIS — N63 Unspecified lump in unspecified breast: Secondary | ICD-10-CM

## 2018-03-11 DIAGNOSIS — C50919 Malignant neoplasm of unspecified site of unspecified female breast: Secondary | ICD-10-CM

## 2018-03-11 NOTE — Progress Notes (Signed)
  Oncology Nurse Navigator Documentation  Navigator Location: [facility]-Med Onc (03/11/18 0900)   )Navigator Encounter Type: Introductory phone call (03/11/18 0900)     Confirmed Diagnosis Date: 03/03/18 (03/11/18 0900)               Patient Visit Type: Initial (03/11/18 0900)   Barriers/Navigation Needs: Education;Coordination of Care (03/11/18 0900)   Interventions: Coordination of Care;Psycho-social support (03/11/18 0900)                      Time Spent with Patient: 30 (03/11/18 0900)   Phoned patient with biopsy results, and explained Dr. Rosana Hoes would go over in depth at her post-op appointment.  She is coming 03/13/18 to fill out [employer] Medicaid paperwork.  Reports she is doing well since excision.  Patient reports her mother and maternal grandmother were diagnosed with breast cancer. States her mother was recently diagnosed with lung cancer, and is being "treated in [place]" No other known cancers in her family.

## 2018-03-13 ENCOUNTER — Other Ambulatory Visit: Payer: Self-pay

## 2018-03-18 ENCOUNTER — Other Ambulatory Visit: Payer: Self-pay

## 2018-03-18 ENCOUNTER — Encounter: Payer: Self-pay | Admitting: Surgery

## 2018-03-18 ENCOUNTER — Ambulatory Visit (INDEPENDENT_AMBULATORY_CARE_PROVIDER_SITE_OTHER): Payer: Self-pay | Admitting: Surgery

## 2018-03-18 VITALS — BP 120/86 | HR 107 | Temp 97.7°F | Resp 16 | Ht 62.0 in | Wt 214.8 lb

## 2018-03-18 DIAGNOSIS — C50911 Malignant neoplasm of unspecified site of right female breast: Secondary | ICD-10-CM

## 2018-03-18 NOTE — H&P (View-Only) (Signed)
Surgical Clinic Progress/Follow-up Note   HPI:  53 y.o. 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 Patient reports very minimal peri-incisional pain, for which she has not been taking narcotic pain medication, and denies N/V, fever/chills, CP, or SOB.  Review of Systems:  Constitutional: denies fever/chills  Respiratory: denies shortness of breath, wheezing  Cardiovascular: denies chest pain, palpitations  Breasts: pain as per interval history, denies redness or nipple drainage Gastrointestinal: denies abdominal pain, N/V, or constipation Skin: Denies any other rashes or skin discolorations except post-surgical wounds as per interval history  Vital Signs:  BP 120/86   Pulse (!) 107   Temp 97.7 F (36.5 C) (Skin)   Resp 16   Ht 5' 2" (1.575 m)   Wt 214 lb 12.8 oz (97.4 kg)   SpO2 96%   BMI 39.29 kg/m    Physical Exam:  Constitutional:  -- Obese body habitus  -- Awake, alert, and oriented x3  Pulmonary:  -- No crackles -- Equal breath sounds bilaterally -- Breathing non-labored at rest Cardiovascular:  -- S1, S2 present  -- No pericardial rubs  Breast: -- Minimal Right breast peri-incisional tenderness to palpation -- Right breast post-surgical incision all well-approximated without any peri-incisional erythema or drainage -- No Right breast nipple drainage or Right axillary lymphadenopathy  Musculoskeletal / Integumentary:  -- Wounds or skin discoloration: None appreciated except post-surgical incisions as described above (Breast) -- Extremities: B/L UE and LE FROM, hands and feet warm  Imaging: No new pertinent imaging available for review  Surgical Pathology (03/03/2018): A. BREAST, RIGHT, MASS, EXCISION:  - INVASIVE MAMMARY CARCINOMA WITH TUBULAR FEATURES, (TUBULAR CARCINOMA).  - PREVIOUS BIOPSY SITE AND CLIP IDENTIFIED.  - FIBROCYSTIC  CHANGES AND MICROCALCIFICATIONS   Tumor Size: 7 mm  Histologic Type: Tubular carcinoma  Histologic Grade (Nottingham Histologic Score)            Glandular (Acinar)/Tubular Differentiation: 1            Nuclear Pleomorphism: 1            Mitotic Rate: 1            Overall Grade: 1  Ductal Carcinoma In Situ (DCIS): Not identified  Margins:            Invasive Carcinoma Margins: Uninvolved by invasive  carcinoma.  Distance from closest margin 15 mm, inferior  Lymphovascular Invasion: Not identified  Pathologic Stage Classification (pTNM, AJCC 8th Edition): pT 1b pN X  Estrogen Receptor (ER) Status: POSITIVE            Percentage of cells with nuclear positivity: >90%            Average intensity of staining: Strong   Progesterone Receptor (PgR) Status: POSITIVE            Percentage of cells with nuclear positivity: 51-90%            Average intensity of staining: Weak   HER2 (by immunohistochemistry): NEGATIVE (score 0)  Assessment:  53 y.o. yo Female with a problem list including...  Patient Active Problem List   Diagnosis Date Noted  . Mass of right breast 02/24/2018    presents to clinic for post-op follow-up evaluation, doing well 15 Days s/p Right breast lumpectomy (T[place]a Miyasaki, 03/03/2018) for tubular variant of mammary carcinoma on final pathology with ER+, PR+, and HER2-.  Plan:              -   discussed pathology results with patient  - consensus recommendations for Right axillary sentinel lymph nodes biopsy per breast tumor board conference discussion also discussed with patient  - all risks, benefits, and alternatives to Right axillary sentinel lymph nodes biopsy were discussed with the patient, all of her questions were answered to her expressed satisfaction, patient expresses she wishes to proceed, and informed consent was obtained accordingly.  - will plan to proceed with  Right axillary sentinel lymph nodes biopsy pending anesthesia and OR availability             - apply sunblock particularly to incisions with sun exposure to reduce pigmentation of scars             - okay to submerge incisions under water (baths, swimming) prn             - anticipate return to clinic 2 weeks after above procedure  - instructed to call office if any questions or concerns  All of the above recommendations were discussed with the patient, and all of patient's questions were answered to her expressed satisfaction.  -- Uziah Sorter E. Rehan Holness, MD, RPVI [place]: [facility] - Partnering for exceptional care. Office: [phone] 

## 2018-03-18 NOTE — Progress Notes (Signed)
Surgical Clinic Progress/Follow-up Note   HPI:  53 y.o. 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 Patient reports very minimal peri-incisional pain, for which she has not been taking narcotic pain medication, and denies N/V, fever/chills, CP, or SOB.  Review of Systems:  Constitutional: denies fever/chills  Respiratory: denies shortness of breath, wheezing  Cardiovascular: denies chest pain, palpitations  Breasts: pain as per interval history, denies redness or nipple drainage Gastrointestinal: denies abdominal pain, N/V, or constipation Skin: Denies any other rashes or skin discolorations except post-surgical wounds as per interval history  Vital Signs:  BP 120/86   Pulse (!) 107   Temp 97.7 F (36.5 C) (Skin)   Resp 16   Ht _0  (1.575 m)   Wt 214 lb 12.8 oz (97.4 kg)   SpO2 96%   BMI 39.29 kg/m    Physical Exam:  Constitutional:  -- Obese body habitus  -- Awake, alert, and oriented x3  Pulmonary:  -- No crackles -- Equal breath sounds bilaterally -- Breathing non-labored at rest Cardiovascular:  -- S1, S2 present  -- No pericardial rubs  Breast: -- Minimal Right breast peri-incisional tenderness to palpation -- Right breast post-surgical incision all well-approximated without any peri-incisional erythema or drainage -- No Right breast nipple drainage or Right axillary lymphadenopathy  Musculoskeletal / Integumentary:  -- Wounds or skin discoloration: None appreciated except post-surgical incisions as described above (Breast) -- Extremities: B/L UE and LE FROM, hands and feet warm  Imaging: No new pertinent imaging available for review  Surgical Pathology (03/03/2018): A. BREAST, RIGHT, MASS, EXCISION:  - INVASIVE MAMMARY CARCINOMA WITH TUBULAR FEATURES, (TUBULAR CARCINOMA).  - PREVIOUS BIOPSY SITE AND CLIP IDENTIFIED.  - FIBROCYSTIC  CHANGES AND MICROCALCIFICATIONS   Tumor Size: 7 mm  Histologic Type: Tubular carcinoma  Histologic Grade (Nottingham Histologic Score)            Glandular (Acinar)/Tubular Differentiation: 1            Nuclear Pleomorphism: 1            Mitotic Rate: 1            Overall Grade: 1  Ductal Carcinoma In Situ (DCIS): Not identified  Margins:            Invasive Carcinoma Margins: Uninvolved by invasive  carcinoma.  Distance from closest margin 15 mm, inferior  Lymphovascular Invasion: Not identified  Pathologic Stage Classification (pTNM, AJCC 8th Edition): pT 1b pN X  Estrogen Receptor (ER) Status: POSITIVE            Percentage of cells with nuclear positivity: >90%            Average intensity of staining: Strong   Progesterone Receptor (PgR) Status: POSITIVE            Percentage of cells with nuclear positivity: 51-90%            Average intensity of staining: Weak   HER2 (by immunohistochemistry): NEGATIVE (score 0)  Assessment:  53 y.o. yo Female with a problem list including...  Patient Active Problem List   Diagnosis Date Noted  . Mass of right breast 02/24/2018    presents to clinic for post-op follow-up evaluation, doing well 15 Days s/p Right breast lumpectomy Margaret Blackburn, 03/03/2018) for tubular variant of mammary carcinoma on final pathology with ER+, PR+, and HER2-.  Plan:              -  discussed pathology results with patient  - consensus recommendations for Right axillary sentinel lymph nodes biopsy per breast tumor board conference discussion also discussed with patient  - all risks, benefits, and alternatives to Right axillary sentinel lymph nodes biopsy were discussed with the patient, all of her questions were answered to her expressed satisfaction, patient expresses she wishes to proceed, and informed consent was obtained accordingly.  - will plan to proceed with  Right axillary sentinel lymph nodes biopsy pending anesthesia and OR availability             - apply sunblock particularly to incisions with sun exposure to reduce pigmentation of scars             - okay to submerge incisions under water (baths, swimming) prn             - anticipate return to clinic 2 weeks after above procedure  - instructed to call office if any questions or concerns  All of the above recommendations were discussed with the patient, and all of patient's questions were answered to her expressed satisfaction.  -- [name] Margaret Hoes, MD, [place]: [facility] - Partnering for exceptional care. Office: [phone]

## 2018-03-18 NOTE — Patient Instructions (Signed)
You are scheduled for surgery at [facility] with Dr Rosana Hoes on 03/21/18. We will call you with your arrival time. You will pre admit by phone and pre admit testing will call you to do this.

## 2018-03-19 ENCOUNTER — Telehealth: Payer: Self-pay

## 2018-03-19 NOTE — Progress Notes (Signed)
Met with patient to complete BCCM application.  She is scheduled to follow-up with Dr. Rosana Hoes for post-op today.  Will accompany to initial Med/Onc visit Thursday.   Oncology Nurse Navigator Documentation  Navigator Location: [facility]-Med Onc (03/18/18 1600) Referral date to RadOnc/MedOnc: 03/20/18 (03/18/18 1600) )Navigator Encounter Type: Clinic/MDC (03/18/18 1600)                     Patient Visit Type: Initial (03/18/18 1600)   Barriers/Navigation Needs: Financial (03/18/18 1600)   Interventions: Coordination of Care;Psycho-social support;Education (03/18/18 1600)   Coordination of Care: Appts (03/18/18 1600)                  Time Spent with Patient: 60 (03/18/18 1600)

## 2018-03-19 NOTE — Telephone Encounter (Signed)
The patient will report to the Radiology desk in the [place] at [facility] on 03/21/18 at 7:45 am the morning of surgery. She is aware of date and time.

## 2018-03-20 ENCOUNTER — Encounter: Payer: Self-pay | Admitting: Oncology

## 2018-03-20 ENCOUNTER — Telehealth: Payer: Self-pay | Admitting: *Deleted

## 2018-03-20 ENCOUNTER — Other Ambulatory Visit: Payer: Self-pay

## 2018-03-20 ENCOUNTER — Inpatient Hospital Stay: Payer: Medicaid Other | Attending: Oncology | Admitting: Oncology

## 2018-03-20 ENCOUNTER — Inpatient Hospital Stay: Payer: Medicaid Other

## 2018-03-20 VITALS — BP 107/73 | HR 98 | Temp 97.4°F | Resp 18 | Ht 64.17 in | Wt 215.4 lb

## 2018-03-20 DIAGNOSIS — F17210 Nicotine dependence, cigarettes, uncomplicated: Secondary | ICD-10-CM

## 2018-03-20 DIAGNOSIS — C50911 Malignant neoplasm of unspecified site of right female breast: Secondary | ICD-10-CM

## 2018-03-20 DIAGNOSIS — C50411 Malignant neoplasm of upper-outer quadrant of right female breast: Secondary | ICD-10-CM | POA: Diagnosis present

## 2018-03-20 DIAGNOSIS — Z803 Family history of malignant neoplasm of breast: Secondary | ICD-10-CM

## 2018-03-20 DIAGNOSIS — Z791 Long term (current) use of non-steroidal anti-inflammatories (NSAID): Secondary | ICD-10-CM | POA: Diagnosis not present

## 2018-03-20 DIAGNOSIS — C50919 Malignant neoplasm of unspecified site of unspecified female breast: Secondary | ICD-10-CM

## 2018-03-20 DIAGNOSIS — Z90710 Acquired absence of both cervix and uterus: Secondary | ICD-10-CM | POA: Insufficient documentation

## 2018-03-20 DIAGNOSIS — Z170 Estrogen receptor positive status [ER+]: Secondary | ICD-10-CM | POA: Insufficient documentation

## 2018-03-20 DIAGNOSIS — Z7189 Other specified counseling: Secondary | ICD-10-CM

## 2018-03-20 DIAGNOSIS — Z1211 Encounter for screening for malignant neoplasm of colon: Secondary | ICD-10-CM

## 2018-03-20 LAB — COMPREHENSIVE METABOLIC PANEL
ALT: 22 U/L (ref 0–44)
ANION GAP: 6 (ref 5–15)
AST: 15 U/L (ref 15–41)
Albumin: 4.3 g/dL (ref 3.5–5.0)
Alkaline Phosphatase: 68 U/L (ref 38–126)
BUN: 13 mg/dL (ref 6–20)
CHLORIDE: 105 mmol/L (ref 98–111)
CO2: 25 mmol/L (ref 22–32)
Calcium: 9.2 mg/dL (ref 8.9–10.3)
Creatinine, Ser: 0.5 mg/dL (ref 0.44–1.00)
GFR calc Af Amer: >60 mL/min (ref >60)
GFR calc non Af Amer: >60 mL/min (ref >60)
Glucose, Bld: 129 mg/dL — ABNORMAL HIGH (ref 70–99)
POTASSIUM: 4.1 mmol/L (ref 3.5–5.1)
Sodium: 136 mmol/L (ref 135–145)
Total Bilirubin: 0.6 mg/dL (ref 0.3–1.2)
Total Protein: 7.8 g/dL (ref 6.5–8.1)

## 2018-03-20 MED ORDER — LIDOCAINE-PRILOCAINE 2.5-2.5 % EX CREA: TOPICAL_CREAM | CUTANEOUS | 0 refills | Status: DC

## 2018-03-20 NOTE — Progress Notes (Signed)
Here for new pt evaluation.  

## 2018-03-20 NOTE — Telephone Encounter (Signed)
Per Koren Shiver can we order Emla cream for the patient before her surgery tomorrow, she uses [facility]. Please call patient to advise.

## 2018-03-20 NOTE — Telephone Encounter (Signed)
Message left for the patient to call back. We have sent in EMLA cream prescription to her pharmacy. She will apply this to her right acerola and cover with plastic wrap one hour prior to leaving for her surgery on 03/21/18.

## 2018-03-20 NOTE — Telephone Encounter (Signed)
Patient called back and notified of instructions.

## 2018-03-21 ENCOUNTER — Encounter
Admission: RE | Disposition: A | Discharge status: Home / Self Care | Payer: Self-pay | Source: Home / Self Care | Attending: Surgery

## 2018-03-21 ENCOUNTER — Ambulatory Visit
Admission: RE | Admit: 2018-03-21 | Discharge: 2018-03-21 | Disposition: A | Discharge status: Home / Self Care | Payer: Medicaid Other | Source: Ambulatory Visit | Attending: Surgery | Admitting: Surgery

## 2018-03-21 ENCOUNTER — Ambulatory Visit: Payer: Medicaid Other | Admitting: Anesthesiology

## 2018-03-21 ENCOUNTER — Other Ambulatory Visit: Payer: Self-pay

## 2018-03-21 ENCOUNTER — Ambulatory Visit
Admission: RE | Admit: 2018-03-21 | Discharge: 2018-03-21 | Disposition: A | Discharge status: Home / Self Care | Payer: Medicaid Other | Attending: Surgery | Admitting: Surgery

## 2018-03-21 DIAGNOSIS — Z170 Estrogen receptor positive status [ER+]: Secondary | ICD-10-CM | POA: Insufficient documentation

## 2018-03-21 DIAGNOSIS — C50911 Malignant neoplasm of unspecified site of right female breast: Secondary | ICD-10-CM

## 2018-03-21 DIAGNOSIS — G4730 Sleep apnea, unspecified: Secondary | ICD-10-CM | POA: Diagnosis not present

## 2018-03-21 DIAGNOSIS — F17200 Nicotine dependence, unspecified, uncomplicated: Secondary | ICD-10-CM | POA: Insufficient documentation

## 2018-03-21 DIAGNOSIS — J449 Chronic obstructive pulmonary disease, unspecified: Secondary | ICD-10-CM | POA: Diagnosis not present

## 2018-03-21 DIAGNOSIS — Z6836 Body mass index (BMI) 36.0-36.9, adult: Secondary | ICD-10-CM | POA: Diagnosis not present

## 2018-03-21 DIAGNOSIS — M1990 Unspecified osteoarthritis, unspecified site: Secondary | ICD-10-CM | POA: Diagnosis not present

## 2018-03-21 HISTORY — PX: SENTINEL NODE BIOPSY: SHX6608

## 2018-03-21 LAB — ESTRADIOL: Estradiol: 10.7 pg/mL

## 2018-03-21 LAB — URINE DRUG SCREEN, QUALITATIVE (ARMC ONLY)
Amphetamines, Ur Screen: NOT DETECTED
Barbiturates, Ur Screen: NOT DETECTED
Benzodiazepine, Ur Scrn: NOT DETECTED
Cannabinoid 50 Ng, Ur ~~LOC~~: POSITIVE — AB
Cocaine Metabolite,Ur ~~LOC~~: NOT DETECTED
MDMA (Ecstasy)Ur Screen: NOT DETECTED
Methadone Scn, Ur: NOT DETECTED
Opiate, Ur Screen: NOT DETECTED
Phencyclidine (PCP) Ur S: NOT DETECTED
TRICYCLIC, UR SCREEN: NOT DETECTED

## 2018-03-21 LAB — FOLLICLE STIMULATING HORMONE: FSH: 45.7 m[IU]/mL

## 2018-03-21 SURGERY — BIOPSY, LYMPH NODE, SENTINEL
Anesthesia: General | Laterality: Right

## 2018-03-21 MED ORDER — FAMOTIDINE 20 MG PO TABS
20.0000 mg | ORAL_TABLET | Freq: Once | ORAL | Status: AC
  Administered 2018-03-21: 20 mg via ORAL

## 2018-03-21 MED ORDER — CEFAZOLIN SODIUM-DEXTROSE 2-4 GM/100ML-% IV SOLN
2.0000 g | INTRAVENOUS | Status: AC
  Administered 2018-03-21: 2 g via INTRAVENOUS

## 2018-03-21 MED ORDER — LACTATED RINGERS IV SOLN
INTRAVENOUS | Status: DC
  Administered 2018-03-21: 08:00:00 via INTRAVENOUS

## 2018-03-21 MED ORDER — GABAPENTIN 300 MG PO CAPS
300.0000 mg | ORAL_CAPSULE | ORAL | Status: AC
  Administered 2018-03-21: 300 mg via ORAL

## 2018-03-21 MED ORDER — ACETAMINOPHEN 500 MG PO TABS
1000.0000 mg | ORAL_TABLET | ORAL | Status: AC
  Administered 2018-03-21: 1000 mg via ORAL

## 2018-03-21 MED ORDER — LIDOCAINE HCL (CARDIAC) PF 100 MG/5ML IV SOSY
PREFILLED_SYRINGE | INTRAVENOUS | Status: DC | PRN
  Administered 2018-03-21: 100 mg via INTRAVENOUS

## 2018-03-21 MED ORDER — ONDANSETRON HCL 4 MG/2ML IJ SOLN: 4.0000 mg | Freq: Once | INTRAMUSCULAR | Status: DC | PRN

## 2018-03-21 MED ORDER — IPRATROPIUM-ALBUTEROL 0.5-2.5 (3) MG/3ML IN SOLN
RESPIRATORY_TRACT | Status: AC
  Administered 2018-03-21: 3 mL via RESPIRATORY_TRACT
  Filled 2018-03-21: qty 3

## 2018-03-21 MED ORDER — FENTANYL CITRATE (PF) 100 MCG/2ML IJ SOLN
25.0000 ug | INTRAMUSCULAR | Status: DC | PRN
  Administered 2018-03-21 (×5): 25 ug via INTRAVENOUS

## 2018-03-21 MED ORDER — ISOSULFAN BLUE 1 % ~~LOC~~ SOLN
SUBCUTANEOUS | Status: AC
  Filled 2018-03-21: qty 5

## 2018-03-21 MED ORDER — FENTANYL CITRATE (PF) 100 MCG/2ML IJ SOLN
INTRAMUSCULAR | Status: DC | PRN
  Administered 2018-03-21 (×3): 50 ug via INTRAVENOUS
  Administered 2018-03-21 (×2): 25 ug via INTRAVENOUS

## 2018-03-21 MED ORDER — LIDOCAINE HCL (PF) 1 % IJ SOLN
INTRAMUSCULAR | Status: AC
  Filled 2018-03-21: qty 30

## 2018-03-21 MED ORDER — MIDAZOLAM HCL 2 MG/2ML IJ SOLN
INTRAMUSCULAR | Status: DC | PRN
  Administered 2018-03-21: 2 mg via INTRAVENOUS

## 2018-03-21 MED ORDER — LIDOCAINE HCL 1 % IJ SOLN
INTRAMUSCULAR | Status: DC | PRN
  Administered 2018-03-21: 10 mL

## 2018-03-21 MED ORDER — DEXAMETHASONE SODIUM PHOSPHATE 10 MG/ML IJ SOLN
INTRAMUSCULAR | Status: DC | PRN
  Administered 2018-03-21: 10 mg via INTRAVENOUS

## 2018-03-21 MED ORDER — FENTANYL CITRATE (PF) 100 MCG/2ML IJ SOLN
INTRAMUSCULAR | Status: AC
  Administered 2018-03-21: 25 ug via INTRAVENOUS
  Filled 2018-03-21: qty 2

## 2018-03-21 MED ORDER — FENTANYL CITRATE (PF) 100 MCG/2ML IJ SOLN
INTRAMUSCULAR | Status: AC
  Filled 2018-03-21: qty 2

## 2018-03-21 MED ORDER — ACETAMINOPHEN 500 MG PO TABS
ORAL_TABLET | ORAL | Status: AC
  Administered 2018-03-21: 1000 mg via ORAL
  Filled 2018-03-21: qty 2

## 2018-03-21 MED ORDER — BUPIVACAINE HCL (PF) 0.5 % IJ SOLN
INTRAMUSCULAR | Status: DC | PRN
  Administered 2018-03-21: 10 mL

## 2018-03-21 MED ORDER — CHLORHEXIDINE GLUCONATE CLOTH 2 % EX PADS: 6.0000 | MEDICATED_PAD | Freq: Once | CUTANEOUS | Status: DC

## 2018-03-21 MED ORDER — ONDANSETRON HCL 4 MG/2ML IJ SOLN
INTRAMUSCULAR | Status: DC | PRN
  Administered 2018-03-21: 4 mg via INTRAVENOUS

## 2018-03-21 MED ORDER — SEVOFLURANE IN SOLN
RESPIRATORY_TRACT | Status: AC
  Filled 2018-03-21: qty 250

## 2018-03-21 MED ORDER — OXYCODONE-ACETAMINOPHEN 5-325 MG PO TABS: 1.0000 | ORAL_TABLET | ORAL | 0 refills | Status: DC | PRN

## 2018-03-21 MED ORDER — LACTATED RINGERS IV SOLN
INTRAVENOUS | Status: DC | PRN
  Administered 2018-03-21: 09:00:00 via INTRAVENOUS

## 2018-03-21 MED ORDER — MIDAZOLAM HCL 2 MG/2ML IJ SOLN
INTRAMUSCULAR | Status: AC
  Filled 2018-03-21: qty 2

## 2018-03-21 MED ORDER — ISOSULFAN BLUE 1 % ~~LOC~~ SOLN
SUBCUTANEOUS | Status: DC | PRN
  Administered 2018-03-21: 5 mg via SUBCUTANEOUS

## 2018-03-21 MED ORDER — PROPOFOL 10 MG/ML IV BOLUS
INTRAVENOUS | Status: DC | PRN
  Administered 2018-03-21: 150 mg via INTRAVENOUS

## 2018-03-21 MED ORDER — TECHNETIUM TC 99M SULFUR COLLOID FILTERED
0.8070 | Freq: Once | INTRAVENOUS | Status: AC | PRN
  Administered 2018-03-21: 0.807 via INTRADERMAL

## 2018-03-21 MED ORDER — GABAPENTIN 300 MG PO CAPS
ORAL_CAPSULE | ORAL | Status: AC
  Administered 2018-03-21: 300 mg via ORAL
  Filled 2018-03-21: qty 1

## 2018-03-21 MED ORDER — IPRATROPIUM-ALBUTEROL 0.5-2.5 (3) MG/3ML IN SOLN
3.0000 mL | RESPIRATORY_TRACT | Status: DC
  Administered 2018-03-21: 3 mL via RESPIRATORY_TRACT

## 2018-03-21 MED ORDER — FAMOTIDINE 20 MG PO TABS
ORAL_TABLET | ORAL | Status: AC
  Administered 2018-03-21: 20 mg via ORAL
  Filled 2018-03-21: qty 1

## 2018-03-21 MED ORDER — BUPIVACAINE HCL (PF) 0.5 % IJ SOLN
INTRAMUSCULAR | Status: AC
  Filled 2018-03-21: qty 30

## 2018-03-21 MED ORDER — CEFAZOLIN SODIUM-DEXTROSE 2-4 GM/100ML-% IV SOLN
INTRAVENOUS | Status: AC
  Filled 2018-03-21: qty 100

## 2018-03-21 SURGICAL SUPPLY — 10 items
CHLORAPREP W/TINT 26ML (MISCELLANEOUS) ×2 IMPLANT
COVER PROBE FLX POLY STRL (MISCELLANEOUS) ×2 IMPLANT
COVER WAND RF STERILE (DRAPES) ×2 IMPLANT
DERMABOND ADVANCED (GAUZE/BANDAGES/DRESSINGS) ×1
DERMABOND ADVANCED .7 DNX12 (GAUZE/BANDAGES/DRESSINGS) ×1 IMPLANT
KIT TURNOVER KIT A (KITS) ×2 IMPLANT
NEEDLE HYPO 22GX1.5 SAFETY (NEEDLE) ×2 IMPLANT
PACK BASIN MINOR ARMC (MISCELLANEOUS) ×2 IMPLANT
SLEVE PROBE SENORX GAMMA FIND (MISCELLANEOUS) ×2 IMPLANT
SYR 10ML LL (SYRINGE) ×2 IMPLANT

## 2018-03-21 NOTE — Anesthesia Post-op Follow-up Note (Signed)
Anesthesia QCDR form completed.        

## 2018-03-21 NOTE — Anesthesia Preprocedure Evaluation (Addendum)
Anesthesia Evaluation  Patient identified by MRN, date of birth, ID band Patient awake    Reviewed: Allergy & Precautions, H&P , NPO status , Patient's Chart, lab work & pertinent test results, reviewed documented beta blocker date and time   History of Anesthesia Complications Negative for: history of anesthetic complications  Airway Mallampati: III  TM Distance: <3 FB Neck ROM: full    Dental  (+) Dental Advidsory Given, Poor Dentition, Partial Lower, Missing   Pulmonary shortness of breath and with exertion, sleep apnea , COPD, neg recent URI, Current Smoker,           Cardiovascular Exercise Tolerance: Good negative cardio ROS       Neuro/Psych negative neurological ROS  negative psych ROS   GI/Hepatic Neg liver ROS, GERD  ,  Endo/Other  neg diabetesMorbid obesity  Renal/GU negative Renal ROS  negative genitourinary   Musculoskeletal   Abdominal   Peds  Hematology negative hematology ROS (+)   Anesthesia Other Findings Past Medical History: No date: Arthritis No date: COPD (chronic obstructive pulmonary disease) (HCC)   Reproductive/Obstetrics negative OB ROS                            Anesthesia Physical  Anesthesia Plan  ASA: III  Anesthesia Plan: General   Post-op Pain Management:    Induction: Intravenous  PONV Risk Score and Plan: 2 and Ondansetron, Dexamethasone, Midazolam, Promethazine and Treatment may vary due to age or medical condition  Airway Management Planned: Oral ETT and LMA  Additional Equipment:   Intra-op Plan:   Post-operative Plan: Extubation in OR  Informed Consent: I have reviewed the patients History and Physical, chart, labs and discussed the procedure including the risks, benefits and alternatives for the proposed anesthesia with the patient or authorized representative who has indicated his/her understanding and acceptance.     Dental  Advisory Given  Plan Discussed with: Anesthesiologist, CRNA and Surgeon  Anesthesia Plan Comments:        Anesthesia Quick Evaluation

## 2018-03-21 NOTE — Interval H&P Note (Signed)
History and Physical Interval Note:  03/21/2018 7:27 AM  Margaret Blackburn  has presented today for surgery, with the diagnosis of RIGHT BREAST CANCER  The various methods of treatment have been discussed with the patient and family. After consideration of risks, benefits and other options for treatment, the patient has consented to  Procedure(s): RIGHT SENTINEL LYMPH  NODE BIOPSY (Right) as a surgical intervention .  The patient's history has been reviewed, patient examined, no change in status, stable for surgery.  I have reviewed the patient's chart and labs.  Questions were answered to the patient's satisfaction.     Vickie Epley

## 2018-03-21 NOTE — Op Note (Signed)
SURGICAL OPERATIVE REPORT   DATE OF PROCEDURE: 03/21/2018  ATTENDING Surgeon(s): Vickie Epley, MD  ASSISTANT(S): Floyce Stakes, RNFA   ANESTHESIA: General   PRE-OPERATIVE DIAGNOSIS: Right breast mammary carcinoma (tubular type) (icd-10: C50.911)   POST-OPERATIVE DIAGNOSIS: Right breast mammary carcinoma (tubular type) (icd-10: C50.911)   PROCEDURE(S):  1.) Excisional biopsy of Right axillary deep sentinel lymph nodes using radiolabel/tracer and lymphazurine blue dye (cpt's: 38101 and 75102)   INTRAOPERATIVE FINDINGS: Maximum pre-SLN biopsy radiotracer count: < 30, maximum radiotracer count in 1st specimen, including firm/hard Right axillary lymph node(s): < 30 with -blue dye, maximum radiotracer count in 2nd specimen, including palpably enlarged Right axillary lymph node: < 30 with -blue dye, final radiotracer count: < 30   INTRAVENOUS FLUIDS: 300 mL crystalloid    ESTIMATED BLOOD LOSS: Minimal (<20 mL)   URINE OUTPUT: No Foley    SPECIMENS: Deep Right axillary sentinel lymph node(s)   IMPLANTS: None   DRAINS: None   COMPLICATIONS: None apparent   CONDITION AT END OF PROCEDURE: Hemodynamically stable and extubated   DISPOSITION OF PATIENT: PACU   INDICATIONS FOR PROCEDURE:  53 y.o. female previously underwent Right breast lumpectomy/partial mastectomy with final pathology suggesting/confirming mammary carcinoma of tubular type (tubular carcinoma). She was discussed at breast tumor board, and consensus recommendation was to proceed with Right axillary sentinel lymph node biopsy. All risks, benefits, and alternatives to excisional biopsy of sentinel lymph node(s), and informed consent was accordingly obtained and documented at that time.   DETAILS OF PROCEDURE: Pre-operative Right circumareolar radiotracer injection was performed by radiology/nuclear medicine. General anesthesia was administered along with appropriate pre-operative antibiotics, and LMA placement was  performed by anesthetist. In supine position, 1 mL aliquots x 5 of lymphazurine blue dye were injected peri-areola and massaged into the tissue x ~5 minutes. Operative site was prepped and draped in the usual sterile fashion, and following a brief time out, maximum Right axillary radiotracer count was checked using probe/detector.    Axillary hair line, pectoralis muscle, and latissimus dorsal muscle were marked using a skin marking pen. Local anesthetic was injected over planned incision site, and a ~2 - 3 cm curvilinear incision was made along a natural skin crease using a #15 blade scalpel and extended deep through subcutaneous tissue using blunt dissection and selective electrocautery, tying any small venous and lymphatic branches using silk ties. No radiotracer counts, nor discrete foci of blue dye were identified in the Right axilla. One abnormally enlarged lymph node was identified. A second cluster of small, but hardened, lymph nodes was likewise identified with no other abnormal lymph nodes able to be appreciated. Considering small tubular carcinoma on pathology, decision was made to conclude procedure with sentinel lymph nodes biopsy without full axillary dissection despite suboptimal radiotracer and blue dye uptake. Additional local anesthetic was injected, and Right axillary dermis was re-approximated using buried interrupted 3-0 Vicryl for dermis and 4-0 Monocryl for epidermis, after which skin was then cleaned and dried, and sterile Dermabond skin glue was applied and allowed to dry.  Patient was then safely able to be extubated, awakened, and transferred to PACU for post-operative monitoring and care.   I was present for all aspects of the above procedure, and no operative complications were apparent.

## 2018-03-21 NOTE — Transfer of Care (Signed)
Immediate Anesthesia Transfer of Care Note  Patient: Margaret Blackburn  Procedure(s) Performed: RIGHT SENTINEL LYMPH  NODE BIOPSY (Right )  Patient Location: PACU  Anesthesia Type:General  Level of Consciousness: sedated  Airway & Oxygen Therapy: Patient Spontanous Breathing and Patient connected to face mask oxygen  Post-op Assessment: Report given to RN and Post -op Vital signs reviewed and stable  Post vital signs: Reviewed and stable  Last Vitals:  Vitals Value Taken Time  BP 145/87 03/21/2018 11:36 AM  Temp    Pulse 90 03/21/2018 11:40 AM  Resp 22 03/21/2018 11:40 AM  SpO2 95 % 03/21/2018 11:40 AM  Vitals shown include unvalidated device data.  Last Pain:  Vitals:   03/21/18 0642  TempSrc: Temporal  PainSc: 0-No pain         Complications: No apparent anesthesia complications

## 2018-03-21 NOTE — Discharge Instructions (Addendum)
In addition to included general post-operative instructions for Right Axillary Sentinel Lymph Node(s) Excisional Biopsy,  Diet: Resume home heart healthy diet.   Activity: No heavy lifting >15 - 20 pounds (children, pets, laundry, garbage) or strenuous activity until follow-up in 2 weeks, but light activity and walking are encouraged. Do not drive or drink alcohol if taking narcotic pain medications.  Wound care: 2 days after surgery (Sunday, 2/16), you may shower/get incision wet with soapy water and pat dry (do not rub incisions), but no baths or submerging incision underwater until follow-up.   Medications: Resume all home medications. For mild to moderate pain: acetaminophen (Tylenol) or ibuprofen/naproxen (if no kidney disease). Combining Tylenol with alcohol can substantially increase your risk of causing liver disease. Narcotic pain medications, if prescribed, can be used for severe pain, though may cause nausea, constipation, and drowsiness. Do not combine Tylenol and Percocet (or similar) within a 6 hour period as Percocet (and similar) contain(s) Tylenol. If you do not need the narcotic pain medication, you do not need to fill the prescription.  Call office [phone]) at any time if any questions, worsening pain, fevers/chills, bleeding, drainage from incision site, or other concerns.   Sentinel Lymph Node Biopsy, Care After This sheet gives you information about how to care for yourself after your procedure. Your health care provider may also give you more specific instructions. If you have problems or questions, contact your health care provider. What can I expect after the procedure? After the procedure, it is common to have:  Blue urine or stool for the next 24-48 hours. This is normal. It is caused by the dye used during the procedure.  Blue skin at the injection site. This may last for up to 8 weeks.  Numbness, tingling, or pain near your incision site.  Swelling or  bruising near your incision. Follow these instructions at home: Incision care      Follow instructions from your health care provider about how to take care of your incision. Make sure you: ? Wash your hands with soap and water before you change your bandage (dressing). If soap and water are not available, use hand sanitizer. ? Change your dressing as told by your health care provider. ? Leave stitches (sutures), skin glue, or adhesive strips in place. These skin closures may need to stay in place for 2 weeks or longer. If adhesive strip edges start to loosen and curl up, you may trim the loose edges. Do not remove adhesive strips completely unless your health care provider tells you to do that.  Check your incision area every day for signs of infection. Check for: ? Redness, swelling, or pain. ? Fluid or blood. ? Warmth. ? Pus or a bad smell.  Do not take baths, swim, or use a hot tub until your health care provider approves. Ask your health care provider if you can take showers. You may be able to shower 24 hours after your procedure. After a shower, pat the incision area dry with a clean towel. Do not rub the incision. That could cause bleeding. Activity  Avoid activities that take a lot of effort.  Return to your normal activities as told by your health care provider. Ask your health care provider what activities are safe for you. General instructions  Take over-the-counter and prescription medicines only as told by your health care provider.  You may resume your regular diet.  If the procedure was done on or near the lymph nodes under your arm (  axillary lymph nodes), do not have your blood pressure taken or have blood drawn from the arm on the side of the biopsy until your health care provider says it is okay.  You may need to be screened for extra fluid around the lymph nodes (lymphedema). Follow instructions from your health care provider about how often you should be  checked.  Keep all follow-up visits as told by your health care provider. This is important. Contact a health care provider if:  Your pain medicine is not helping.  You have more redness, swelling, or pain around your biopsy site.  You have more fluid or blood coming from your incision.  Your incision feels warm to the touch.  You have pus or a bad smell coming from your incision.  You have nausea and vomiting.  You have any new bruising.  You have chills or a fever. Get help right away if:  You have pain that is getting worse, and your medicine is not helping.  You have vomiting that will not stop.  You have chest pain or trouble breathing. Summary  After the procedure, it is common to have blue urine or stool for the next 24-48 hours.  Follow instructions from your health care provider about how to take care of your incision. Check your incision area every day for signs of infection.  Take over-the-counter and prescription medicines only as told by your health care provider.  Ask your health care provider when you can return to your normal activities. This information is not intended to replace advice given to you by your health care provider. Make sure you discuss any questions you have with your health care provider. Document Released: 09/06/2003 Document Revised: 02/04/2017 Document Reviewed: 02/04/2017 Elsevier Interactive Patient Education  2019 [employer]   1) The drugs that you were given will stay in your system until tomorrow so for the next 24 hours you should not:  A) Drive an automobile B) Make any legal decisions C) Drink any alcoholic beverage   2) You may resume regular meals tomorrow.  Today it is better to start with liquids and gradually work up to solid foods.  You may eat anything you prefer, but it is better to start with liquids, then soup and crackers, and gradually work up to solid  foods.   3) Please notify your doctor immediately if you have any unusual bleeding, trouble breathing, redness and pain at the surgery site, drainage, fever, or pain not relieved by medication.    4) Additional Instructions:        Please contact your physician with any problems or Same Day Surgery at [phone], Monday through Friday 6 am to 4 pm, or [place] at [facility] number at [phone].

## 2018-03-23 NOTE — Anesthesia Postprocedure Evaluation (Signed)
Anesthesia Post Note  Patient: Margaret Blackburn  Procedure(s) Performed: RIGHT SENTINEL LYMPH  NODE BIOPSY (Right )  Patient location during evaluation: PACU Anesthesia Type: General Level of consciousness: awake and alert and oriented Pain management: pain level controlled Vital Signs Assessment: post-procedure vital signs reviewed and stable Respiratory status: spontaneous breathing Cardiovascular status: blood pressure returned to baseline Anesthetic complications: no     Last Vitals:  Vitals:   03/21/18 1239 03/21/18 1330  BP: 117/79 125/75  Pulse: 85 96  Resp: 14 16  Temp: (!) 36.2 C   SpO2: 99% 100%    Last Pain:  Vitals:   03/21/18 1239  TempSrc: Temporal  PainSc: Asleep                 Meliss Fleek

## 2018-03-24 ENCOUNTER — Encounter: Payer: Self-pay | Admitting: Oncology

## 2018-03-24 DIAGNOSIS — C50919 Malignant neoplasm of unspecified site of unspecified female breast: Secondary | ICD-10-CM | POA: Insufficient documentation

## 2018-03-24 DIAGNOSIS — Z7189 Other specified counseling: Secondary | ICD-10-CM | POA: Insufficient documentation

## 2018-03-24 DIAGNOSIS — C50911 Malignant neoplasm of unspecified site of right female breast: Secondary | ICD-10-CM

## 2018-03-24 LAB — SURGICAL PATHOLOGY

## 2018-03-24 NOTE — Progress Notes (Signed)
Hematology/Oncology Consult note [facility] Telephone:([phone] [phone]  Patient Care Team: Patient, No Pcp Per as PCP - General (General Practice)   Name of the patient: Margaret Blackburn  [id number]  01-29-1966    Reason for referral-new diagnosis of breast cancer   Referring physician-Dr. Rosana Hoes  Date of visit: 03/24/18   History of presenting illness-patient is a 53 year old female with no prior history of breast cancer.  She recently underwent a screening mammogram in November 2019 which showed focal architectural distortion in the upper outer portion of the right breast.  Ultrasound of the right axilla was negative for adenopathy.  Core biopsy showed atypical small glandular proliferation suspicious for tubular carcinoma.  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  7 mm, grade 1 ER greater than 90% positive, PR 51 to 90% positive and HER-2/neu negative.  Sentinel lymph node biopsy was not done.  Case discussed at tumor board and sentinel lymph node biopsy was recommended and she is going back for the same on 03/21/2018.  Patient is G2, P2 L2.  Has a prior history of hysterectomy but ovaries are still in situ.  Did not use HRT.  Used hormonal contraception roughly for 4 years remotely.  Family history significant for breast cancer in her mother in her 65s, maternal grandmother.  Mother also was diagnosed recently with lung cancer.  Patient currently smokes daily about 1 pack of cigarettes per day and has been doing so for over 30 years.  Overall she feels well and denies any specific complaints today.  Her appetite is good and she denies any unintentional weight loss.  Denies any new aches or pains anywhere.  ECOG PS- 1  Pain scale- 0   Review of systems- Review of Systems  Constitutional: Negative for chills, fever, malaise/fatigue and  weight loss.  HENT: Negative for congestion, ear discharge and nosebleeds.   Eyes: Negative for blurred vision.  Respiratory: Negative for cough, hemoptysis, sputum production, shortness of breath and wheezing.   Cardiovascular: Negative for chest pain, palpitations, orthopnea and claudication.  Gastrointestinal: Negative for abdominal pain, blood in stool, constipation, diarrhea, heartburn, melena, nausea and vomiting.  Genitourinary: Negative for dysuria, flank pain, frequency, hematuria and urgency.  Musculoskeletal: Negative for back pain, joint pain and myalgias.  Skin: Negative for rash.  Neurological: Negative for dizziness, tingling, focal weakness, seizures, weakness and headaches.  Endo/Heme/Allergies: Does not bruise/bleed easily.  Psychiatric/Behavioral: Negative for depression and suicidal ideas. The patient does not have insomnia.     Allergies  Allergen Reactions  . Penicillins Rash and Other (See Comments)    DID THE REACTION INVOLVE: Swelling of the face/tongue/throat, SOB, or low BP? No Sudden or severe rash/hives, skin peeling, or the inside of the mouth or nose? Yes, blisters. Not immediate. Did it require medical treatment? Yes When did it last happen? 40s If all above answers are "NO", may proceed with cephalosporin use.     Patient Active Problem List   Diagnosis Date Noted  . Mass of right breast 02/24/2018     Past Medical History:  Diagnosis Date  . Arthritis   . COPD (chronic obstructive pulmonary disease) ([place])      Past Surgical History:  Procedure Laterality Date  . ABDOMINAL HYSTERECTOMY    . BACK SURGERY    . BREAST BIOPSY Right 12/25/2017   affirm bx , x clip,TUBULAR  CARCINOMA  . BREAST  LUMPECTOMY Right 03/03/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  . BREAST LUMPECTOMY WITH NEEDLE LOCALIZATION Right 03/03/2018   Procedure: BREAST LUMPECTOMY WITH NEEDLE LOCALIZATION;  Surgeon: Vickie Epley, MD;  Location: [facility] ORS;  Service: General;  Laterality:  Right;  . SENTINEL NODE BIOPSY Right 03/21/2018   Procedure: RIGHT SENTINEL LYMPH  NODE BIOPSY;  Surgeon: Vickie Epley, MD;  Location: [facility] ORS;  Service: General;  Laterality: Right;    Social History   Socioeconomic History  . Marital status: Single    Spouse name: Not on file  . Number of children: 2  . Years of education: Not on file  . Highest education level: Not on file  Occupational History  . Not on file  Social Needs  . Financial resource strain: Not on file  . Food insecurity:    Worry: Not on file    Inability: Not on file  . Transportation needs:    Medical: Not on file    Non-medical: Not on file  Tobacco Use  . Smoking status: Current Every Day Smoker    Packs/day: 1.00    Years: 30.00    Pack years: 30.00    Types: Cigarettes  . Smokeless tobacco: Never Used  Substance and Sexual Activity  . Alcohol use: No  . Drug use: Not Currently    Types: Marijuana    Comment: past  . Sexual activity: Not on file  Lifestyle  . Physical activity:    Days per week: Not on file    Minutes per session: Not on file  . Stress: Not on file  Relationships  . Social connections:    Talks on phone: Not on file    Gets together: Not on file    Attends religious service: Not on file    Active member of club or organization: Not on file    Attends meetings of clubs or organizations: Not on file    Relationship status: Not on file  . Intimate partner violence:    Fear of current or ex partner: Not on file    Emotionally abused: Not on file    Physically abused: Not on file    Forced sexual activity: Not on file  Other Topics Concern  . Not on file  Social History Narrative  . Not on file     Family History  Problem Relation Age of Onset  . Breast cancer Mother 83  . Lung cancer Mother   . Breast cancer Maternal Grandmother   . Thyroid cancer Father      Current Outpatient Medications:  .  ibuprofen (ADVIL,MOTRIN) 200 MG tablet, Take 800 mg by mouth  every 6 (six) hours as needed for headache or moderate pain., Disp: , Rfl:  .  lidocaine-prilocaine (EMLA) cream, Apply to the areola and cover with plastic wrap one hour prior to leaving for surgery. (Patient not taking: Reported on 03/21/2018), Disp: 5 g, Rfl: 0 .  naproxen sodium (ALEVE) 220 MG tablet, Take 440 mg by mouth daily as needed (for pain or headache)., Disp: , Rfl:  .  oxyCODONE-acetaminophen (PERCOCET/ROXICET) 5-325 MG tablet, Take 1 tablet by mouth every 4 (four) hours as needed for severe pain., Disp: 20 tablet, Rfl: 0   Physical exam:  Vitals:   03/20/18 0956  BP: 107/73  Pulse: (S) 98  Resp: 18  Temp: (!) 97.4 F (36.3 C)  TempSrc: Tympanic  Weight: 215 lb 6.4 oz (97.7 kg)  Height: 5' 4.17" (1.63 m)   Physical Exam  Constitutional:      General: She is not in acute distress. HENT:     Head: Normocephalic and atraumatic.  Eyes:     Pupils: Pupils are equal, round, and reactive to light.  Neck:     Musculoskeletal: Normal range of motion.  Cardiovascular:     Rate and Rhythm: Normal rate and regular rhythm.     Heart sounds: Normal heart sounds.  Pulmonary:     Effort: Pulmonary effort is normal.     Breath sounds: Normal breath sounds.  Abdominal:     General: Bowel sounds are normal.     Palpations: Abdomen is soft.  Skin:    General: Skin is warm and dry.  Neurological:     Mental Status: She is alert and oriented to person, place, and time.    Breast exam was performed in seated and lying down position. Patient is status post right lumpectomy with a well-healed surgical scar. No evidence of any palpable masses. No evidence of axillary adenopathy. No evidence of any palpable masses or lumps in the left breast. No evidence of leftt axillary adenopathy     CMP Latest Ref Rng & Units 03/20/2018  Glucose 70 - 99 mg/dL 129(H)  BUN 6 - 20 mg/dL 13  Creatinine 0.44 - 1.00 mg/dL 0.50  Sodium 135 - 145 mmol/L 136  Potassium 3.5 - 5.1 mmol/L 4.1  Chloride  98 - 111 mmol/L 105  CO2 22 - 32 mmol/L 25  Calcium 8.9 - 10.3 mg/dL 9.2  Total Protein 6.5 - 8.1 g/dL 7.8  Total Bilirubin 0.3 - 1.2 mg/dL 0.6  Alkaline Phos 38 - 126 U/L 68  AST 15 - 41 U/L 15  ALT 0 - 44 U/L 22   CBC Latest Ref Rng & Units 02/20/2018  WBC 4.0 - 10.5 K/uL 10.1  Hemoglobin 12.0 - 15.0 g/dL 13.3  Hematocrit 36.0 - 46.0 % 40.3  Platelets 150 - 400 K/uL 354    No images are attached to the encounter.  Nm Sentinel Node Injection  Result Date: 03/21/2018 CLINICAL DATA:  Right breast cancer. EXAM: NUCLEAR MEDICINE BREAST LYMPHOSCINTIGRAPHY TECHNIQUE: Intradermal injection of radiopharmaceutical was performed at the 12 o'clock, 3 o'clock, 6 o'clock, and 9 o'clock positions around the right nipple. The patient was then sent to the operating room where the sentinel node(s) were identified and removed by the surgeon. RADIOPHARMACEUTICALS:  Total of 0.807 mCi Millipore-filtered Technetium-55msulfur colloid, injected in four aliquots. IMPRESSION: Uncomplicated intradermal injection of Technetium-97mulfur colloid for purposes of sentinel node identification. Electronically Signed   By: AdMarkus Daft.D.   On: 03/21/2018 09:36   Mm Breast Surgical Specimen  Result Date: 03/03/2018 CLINICAL DATA:  Evaluate specimen EXAM: SPECIMEN RADIOGRAPH OF THE RIGHT BREAST COMPARISON:  Previous exam(s). FINDINGS: Status post excision of the right breast. The wire tip and biopsy marker clip are present and are marked for pathology. IMPRESSION: Specimen radiograph of the right breast. Electronically Signed   By: DaDorise BullionII M.D   On: 03/03/2018 11:32   Mm Right Plc Breast Loc Dev   1st Lesion  Inc Mammo Guide  Result Date: 03/03/2018 CLINICAL DATA:  Needle localization in the right breast for surgery later today. EXAM: NEEDLE LOCALIZATION OF THE RIGHT BREAST WITH MAMMO GUIDANCE COMPARISON:  Previous exams. FINDINGS: Patient presents for needle localization prior to surgery. I met with the  patient and we discussed the procedure of needle localization including benefits and alternatives. We discussed the high likelihood of  a successful procedure. We discussed the risks of the procedure, including infection, bleeding, tissue injury, and further surgery. Informed, written consent was given. The usual time-out protocol was performed immediately prior to the procedure. Using mammographic guidance, sterile technique, 1% lidocaine and a 9 cm modified Kopans needle, the X shaped biopsy clip was localized using lateral approach. The images were marked for the surgeon. IMPRESSION: Needle localization right breast. No apparent complications. Electronically Signed   By: Dorise Bullion III M.D   On: 03/03/2018 08:27    Assessment and plan- Patient is a 53 y.o. 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  At this time I will await sentinel lymph node biopsy results.  Given that tubular carcinoma is generally biologically favorable and patient has grade 1 strongly ER PR positive tumor I doubt we will have a positive sentinel lymph node biopsy.  The sentinel lymph node biopsy is negative patient does not require adjuvant chemotherapy and she would not benefit from Oncotype testing given that her tumor is less than 1 cm in size and grade 1 and she does not meet TAILORx criteria for testing.  I have explained all this to her.  I will follow-up on the sentinel lymph node biopsy results and if negative I will refer her to radiation oncology to discuss adjuvant radiation treatment.  Given that his tumor is strongly ER PR positive there would be a role for hormone therapy for 5 years.  Patient is status post hysterectomy but still has ovaries in situ.  I will therefore be checking her [place] and estradiol levels to determine if she is postmenopausal or premenopausal.  I have discussed  both risks and benefits of tamoxifen and AI including all but not limited to hot flashes, mood swings, arthralgias and worsening bone health which is more associated with AI.  Risk of uterine cancer cataracts associated with tamoxifen.  I have given her written information about AI which she will need to take along with calcium and vitamin D.  She will also need a baseline bone density scan.  Treatment will be given with a curative intent.  She will start hormone therapy upon completion of radiation treatment and she will call us when she is nearing the end of radiation treatment and we will go ahead and prescribe hormone therapy at that time.  Patient verbalized understanding.  I will tentatively see her back in 3 months time with a CMP and a bone density scan prior  Patient has never had a screening colonoscopy and I will refer her to GI for the same.  Given family history of breast cancer in her mother and maternal grandmother I will also refer her to genetic counseling   Thank you for this kind referral and the opportunity to participate in the care of this patient   Visit Diagnosis 1. Invasive carcinoma of breast ([place])   2. Special screening for malignant neoplasms, colon     Dr. Randa Evens, MD, MPH [facility] at [facility] [phone] 03/24/2018 8:33 AM

## 2018-03-26 ENCOUNTER — Telehealth: Payer: Self-pay | Admitting: *Deleted

## 2018-03-26 NOTE — Telephone Encounter (Signed)
Spoke with the patient and she states that she has tingling and numbness from her surgery site down her arm along with some swelling under the arm and a little bit down her arm to her right hand. She reports that she has been using Ibuprofen to augment her prescription pain medication. She just wanted to know if the numbness and swelling was normal. I let the patient know that some numbness and swelling was normal. I told her to make sure she was using heat to the area to help with the discomfort and to make sure she checked the temperature to be sure it was not too hot. I also instructed her to elevate her arm as much as possible to help with the swelling. She will try this and is aware to call back if she has no improvement or her symptoms worsen.

## 2018-03-26 NOTE — Telephone Encounter (Signed)
Patient stated that she had surgery on Friday 03/21/18, pt stated that its swollen under arm still and has some numbness, she stated that it started since after surgery, she also complains of her elbow hurts and is numb. Please call and advise

## 2018-03-27 ENCOUNTER — Ambulatory Visit (INDEPENDENT_AMBULATORY_CARE_PROVIDER_SITE_OTHER): Payer: Self-pay | Admitting: Surgery

## 2018-03-27 ENCOUNTER — Encounter: Payer: Self-pay | Admitting: Surgery

## 2018-03-27 ENCOUNTER — Other Ambulatory Visit: Payer: Self-pay

## 2018-03-27 VITALS — BP 118/77 | HR 105 | Temp 97.5°F | Resp 16 | Ht 63.0 in | Wt 223.4 lb

## 2018-03-27 DIAGNOSIS — C50919 Malignant neoplasm of unspecified site of unspecified female breast: Secondary | ICD-10-CM

## 2018-03-27 DIAGNOSIS — Z4889 Encounter for other specified surgical aftercare: Secondary | ICD-10-CM

## 2018-03-27 MED ORDER — GABAPENTIN 300 MG PO CAPS: 300.0000 mg | ORAL_CAPSULE | Freq: Three times a day (TID) | ORAL | 0 refills | Status: DC

## 2018-03-27 NOTE — Patient Instructions (Signed)
Please call with any questions or concerns.   You may apply ice pack to help with swelling.

## 2018-03-27 NOTE — Progress Notes (Signed)
Surgical Clinic Progress/Follow-up Note   HPI:  53 y.o. 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 Patient reports she has been experiencing medial posterior upper arm numbness and pain with light touch. She also describes similar, albeit barely noticeable, Right medial forearm numbness and sensitivity to light touch, but during her appointment today says she's unable to find anywhere on her forearm with such symptoms. She otherwise describes no issues with the wounds themselves, denies any significant pain, surrounding redness, drainage, or any N/V, fever/chills, CP, or SOB.  Review of Systems:  Constitutional: denies fever/chills  Respiratory: denies shortness of breath, wheezing  Cardiovascular: denies chest pain, palpitations  Breasts: denies pain, mass(es), or nipple discharge/drainage Musculoskeletal: RUQ numbness and paresthesias as per interval history Skin: Denies any other rashes or skin discolorations except post-surgical wounds as per interval history  Vital Signs:  BP 118/77   Pulse (!) 105   Temp (!) 97.5 F (36.4 C) (Oral)   Resp 16   Ht 5\' 3"  (1.6 m)   Wt 223 lb 6.4 oz (101.3 kg)   SpO2 97%   BMI 39.57 kg/m    Physical Exam:  Constitutional:  -- Obese body habitus  -- Awake, alert, and oriented x3  Pulmonary:  -- No crackles -- Equal breath sounds bilaterally -- Breathing non-labored at rest Cardiovascular:  -- S1, S2 present  -- No pericardial rubs Musculoskeletal / Integumentary:  -- Wounds or skin discoloration: None appreciated except post-surgical incisions well-approximated without any associated erythema or drainage, no appreciable Right axillary hematoma or seroma, minimally tender to palpation -- Extremities: B/L UE and LE FROM,  hands and feet warm, no edema   Assessment:  53 y.o. yo Female with a problem list including...  Patient Active Problem List   Diagnosis Date Noted  . Invasive carcinoma of breast ([place]) 03/24/2018  . Goals of care, counseling/discussion 03/24/2018  . Malignant neoplasm of right breast greater than or equal to 2 cm in greatest dimension ([place])   . 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  Plan:              - pathology results discussed  - Neurontin 300 mg TID + apply ice prn             - no heavy lifting >15 - 20 lbs or strenuous activities x 2 weeks             - okay to shower, but do not submerge incisions under water (baths, swimming) until follow-up             - apply sunblock particularly to incisions with sun exposure to reduce pigmentation of scars             - return to clinic in 2 weeks, instructed to call office if any questions or concerns  All of the above recommendations were discussed with the patient, and all of patient's questions were answered to her expressed satisfaction.  -- [name] Rosana Hoes, MD, [place]: [facility] - Partnering for exceptional care. Office: [phone]

## 2018-04-03 ENCOUNTER — Encounter: Payer: Self-pay | Admitting: Surgery

## 2018-04-04 ENCOUNTER — Encounter: Payer: Self-pay | Admitting: Radiation Oncology

## 2018-04-04 ENCOUNTER — Other Ambulatory Visit: Payer: Self-pay

## 2018-04-04 ENCOUNTER — Ambulatory Visit
Admission: RE | Admit: 2018-04-04 | Discharge: 2018-04-04 | Disposition: A | Discharge status: Home / Self Care | Payer: Medicaid Other | Source: Ambulatory Visit | Attending: Radiation Oncology | Admitting: Radiation Oncology

## 2018-04-04 VITALS — BP 114/74 | HR 87 | Temp 97.2°F | Resp 16 | Wt 215.8 lb

## 2018-04-04 DIAGNOSIS — F17210 Nicotine dependence, cigarettes, uncomplicated: Secondary | ICD-10-CM | POA: Diagnosis not present

## 2018-04-04 DIAGNOSIS — C50919 Malignant neoplasm of unspecified site of unspecified female breast: Secondary | ICD-10-CM

## 2018-04-04 DIAGNOSIS — M129 Arthropathy, unspecified: Secondary | ICD-10-CM | POA: Insufficient documentation

## 2018-04-04 DIAGNOSIS — Z808 Family history of malignant neoplasm of other organs or systems: Secondary | ICD-10-CM | POA: Diagnosis not present

## 2018-04-04 DIAGNOSIS — Z803 Family history of malignant neoplasm of breast: Secondary | ICD-10-CM | POA: Diagnosis not present

## 2018-04-04 DIAGNOSIS — J449 Chronic obstructive pulmonary disease, unspecified: Secondary | ICD-10-CM | POA: Diagnosis not present

## 2018-04-04 DIAGNOSIS — Z801 Family history of malignant neoplasm of trachea, bronchus and lung: Secondary | ICD-10-CM | POA: Insufficient documentation

## 2018-04-04 DIAGNOSIS — C50411 Malignant neoplasm of upper-outer quadrant of right female breast: Secondary | ICD-10-CM | POA: Insufficient documentation

## 2018-04-04 DIAGNOSIS — Z170 Estrogen receptor positive status [ER+]: Secondary | ICD-10-CM | POA: Insufficient documentation

## 2018-04-04 DIAGNOSIS — Z79899 Other long term (current) drug therapy: Secondary | ICD-10-CM | POA: Diagnosis not present

## 2018-04-04 NOTE — Consult Note (Signed)
NEW PATIENT EVALUATION  Name: Margaret Blackburn  MRN: [id number]  Date:   04/04/2018     DOB: May 27, 1965   This 53 y.o. 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 provider found  CHIEF COMPLAINT:  Chief Complaint  Patient presents with  . Breast Cancer    initial consultation of breast cancer    DIAGNOSIS: The encounter diagnosis was Invasive carcinoma of breast ([place]).   PREVIOUS INVESTIGATIONS:  Mammogram and ultrasound reviewed Surgical pathology reports reviewed Clinical notes reviewed Case presented at breast, cancer conference  HPI: patient is a 53 year old female who presented with an abnormal screening mammogram of her right breast showing architectural distortion in the upper-outer quadrant of the right breast which was new since prior mammograms. Targeted ultrasound was performed positive for atypical small glandular proliferation suspicious for tubular carcinoma. She went on to have a wide local excisionshowing a 7 mm tubular carcinoma overall grade 1. Margins were clear at 15 mm.tumor was strongly ER/PR positive HER-2/neu not overexpressed. She went back and had sentinel lymph node performed 5 lymph nodes were negative for metastatic disease. Reportedly she's developed some nerve injury since the sentinel node biopsy causing pain and tenderness and some decreased functionality of her right upper extremity. She otherwise is healing well in her breast. She specifically denies breast tenderness cough or bone pain. She's been seen by medical oncology and not thought to be candidate for chemotherapy based on the small tumor size under 1 cm does not meet Lovena Le criteria for Oncotype DX.  PLANNED TREATMENT REGIMEN: right whole breast radiation  PAST MEDICAL HISTORY:  has a past medical history of  Arthritis and COPD (chronic obstructive pulmonary disease) ([place]).    PAST SURGICAL HISTORY:  Past Surgical History:  Procedure Laterality Date  . ABDOMINAL HYSTERECTOMY    . BACK SURGERY    . BREAST BIOPSY Right 12/25/2017   affirm bx , x clip,TUBULAR  CARCINOMA  . 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 BREAST LUMPECTOMY WITH NEEDLE LOCALIZATION Right 03/03/2018   Procedure: BREAST LUMPECTOMY WITH NEEDLE LOCALIZATION;  Surgeon: Vickie Epley, MD;  Location: [facility] ORS;  Service: General;  Laterality: Right;  . SENTINEL NODE BIOPSY Right 03/21/2018   Procedure: RIGHT SENTINEL LYMPH  NODE BIOPSY;  Surgeon: Vickie Epley, MD;  Location: [facility] ORS;  Service: General;  Laterality: Right;    FAMILY HISTORY: family history includes Breast cancer in her maternal grandmother; Breast cancer (age of onset: 67) in her mother; Lung cancer in her mother; Thyroid cancer in her father.  SOCIAL HISTORY:  reports that she has been smoking cigarettes. She has a 30.00 pack-year smoking history. She has never used smokeless tobacco. She reports previous drug use. Drug: Marijuana. She reports that she does not drink alcohol.  ALLERGIES: Penicillins  MEDICATIONS:  Current Outpatient Medications  Medication Sig Dispense Refill  . ibuprofen (ADVIL,MOTRIN) 200 MG tablet Take 800 mg by mouth every 6 (six) hours as needed for headache or moderate pain.    . naproxen sodium (ALEVE) 220 MG tablet Take 440 mg by mouth daily as needed (for pain or headache).    . oxyCODONE-acetaminophen (PERCOCET/ROXICET) 5-325 MG tablet Take 1 tablet by mouth every 4 (four) hours as needed for severe pain. (Patient not taking: Reported on 04/04/2018) 20 tablet 0  No current facility-administered medications for this encounter.     ECOG PERFORMANCE STATUS:  1 - Symptomatic but completely ambulatory  REVIEW OF SYSTEMS:  Patient denies any weight loss, fatigue, weakness, fever, chills or night sweats. Patient  denies any loss of vision, blurred vision. Patient denies any ringing  of the ears or hearing loss. No irregular heartbeat. Patient denies heart murmur or history of fainting. Patient denies any chest pain or pain radiating to her upper extremities. Patient denies any shortness of breath, difficulty breathing at night, cough or hemoptysis. Patient denies any swelling in the lower legs. Patient denies any nausea vomiting, vomiting of blood, or coffee ground material in the vomitus. Patient denies any stomach pain. Patient states has had normal bowel movements no significant constipation or diarrhea. Patient denies any dysuria, hematuria or significant nocturia. Patient denies any problems walking, swelling in the joints or loss of balance. Patient denies any skin changes, loss of hair or loss of weight. Patient denies any excessive worrying or anxiety or significant depression. Patient denies any problems with insomnia. Patient denies excessive thirst, polyuria, polydipsia. Patient denies any swollen glands, patient denies easy bruising or easy bleeding. Patient denies any recent infections, allergies or URI. Patient "s visual fields have not changed significantly in recent time.    PHYSICAL EXAM: BP 114/74 (BP Location: Left Arm, Patient Position: Sitting)   Pulse 87   Temp (!) 97.2 F (36.2 C) (Tympanic)   Resp 16   Wt 215 lb 13.3 oz (97.9 kg)   BMI 38.23 kg/m  Right breast is wide local excision scar which is healed well no dominant mass or nodularity is noted in either breast in 2 positions examined. No axillary or supraclavicular adenopathy is identified. She's extreme tenderness of her right upper extremity which is unusual.Well-developed well-nourished patient in NAD. HEENT reveals PERLA, EOMI, discs not visualized.  Oral cavity is clear. No oral mucosal lesions are identified. Neck is clear without evidence of cervical or supraclavicular adenopathy. Lungs are clear to A&P. Cardiac examination is  essentially unremarkable with regular rate and rhythm without murmur rub or thrill. Abdomen is benign with no organomegaly or masses noted. Motor sensory and DTR levels are equal and symmetric in the upper and lower extremities. Cranial nerves II through XII are grossly intact. Proprioception is intact. No peripheral adenopathy or edema is identified. No motor or sensory levels are noted. Crude visual fields are within normal range.  LABORATORY DATA: pathology reports reviewed    RADIOLOGY RESULTS:mammogram and ultrasound reviewed   IMPRESSION: a stage IA invasive mammary carcinoma well-differentiated tubular type of the right breast status post wide local excision and sentinel node biopsy ER/PR positive in 53 year old female  PLAN: at this time I to go ahead with whole breast radiation. Her breasts straightly large and pendulous would not suitable for hypofractionated course of treatment. Would plan on delivering 5040 cGy in 28 fractions boosting her scar another 1400 cGy using electron beam. Risks and benefits of treatment include a skin reaction fatigue alteration of blood counts possible inclusion of superficial lung all were discussed in detail with the patient. She seems to comprehend my treatment plan well. I have personally set up and ordered CT simulation for next week. Patient also will benefit from anti-estrogen therapy after completion of radiation. I've emphasized she needs to exercise her arm and may offer physical therapy in the next several weeks should she not improve.  I would like to take this opportunity to thank you for allowing  me to participate in the care of your patient.Noreene Filbert, MD

## 2018-04-08 ENCOUNTER — Encounter: Payer: Self-pay | Admitting: *Deleted

## 2018-04-10 ENCOUNTER — Ambulatory Visit (INDEPENDENT_AMBULATORY_CARE_PROVIDER_SITE_OTHER): Payer: Medicaid Other | Admitting: Surgery

## 2018-04-10 ENCOUNTER — Ambulatory Visit
Admission: RE | Admit: 2018-04-10 | Discharge: 2018-04-10 | Disposition: A | Discharge status: Home / Self Care | Payer: Medicaid Other | Source: Ambulatory Visit | Attending: Radiation Oncology | Admitting: Radiation Oncology

## 2018-04-10 ENCOUNTER — Other Ambulatory Visit: Payer: Self-pay

## 2018-04-10 ENCOUNTER — Encounter: Payer: Self-pay | Admitting: Surgery

## 2018-04-10 VITALS — BP 123/87 | HR 94 | Temp 97.7°F | Ht 63.0 in | Wt 214.0 lb

## 2018-04-10 DIAGNOSIS — C50919 Malignant neoplasm of unspecified site of unspecified female breast: Secondary | ICD-10-CM

## 2018-04-10 DIAGNOSIS — Z510 Encounter for antineoplastic radiation therapy: Secondary | ICD-10-CM | POA: Diagnosis not present

## 2018-04-10 DIAGNOSIS — C50911 Malignant neoplasm of unspecified site of right female breast: Secondary | ICD-10-CM | POA: Diagnosis not present

## 2018-04-10 DIAGNOSIS — Z170 Estrogen receptor positive status [ER+]: Secondary | ICD-10-CM | POA: Insufficient documentation

## 2018-04-10 NOTE — Patient Instructions (Signed)
Return in four weeks.The patient is aware to call back for any questions or concerns.

## 2018-04-10 NOTE — Progress Notes (Signed)
Surgical Clinic Progress/Follow-up Note   HPI:  53 y.o. 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 Patient denies pain, redness, or drainage at any of her Right breast or axillary incision sites, but reports her Right upper medial arm numbness and electrical pain with light touch are essentially unchanged since her appointment just under 2 weeks ago, though has since been relieved partially with ibuprofen since she stopped taking gabapentin due to side effects. She has also experienced similar dorsal hand and medial forearm paresthesias, though more variably and not as constant as previously. She describes she has been doing stretching and strengthening exercises and denies RUE weakness. She states she is looking forward to upcoming whole breast radiation therapy and is going for mapping after her appointment today. She otherwise denies fever/chills, CP, or SOB.  Review of Systems:  Constitutional: denies fever/chills  Respiratory: denies shortness of breath, wheezing  Cardiovascular: denies chest pain, palpitations Breast and RUE: pain, paresthesias as per interval history Gastrointestinal: denies abdominal pain, N/V, or diarrhea Skin: Denies any other rashes or skin discolorations except post-surgical wounds as per interval history  Vital Signs:  BP 123/87   Pulse 94   Temp 97.7 F (36.5 C) (Temporal)   Ht 5\' 3"  (1.6 m)   Wt 214 lb (97.1 kg)   SpO2 98%   BMI 37.91 kg/m    Physical Exam:  Constitutional:  -- Obese body habitus  -- Awake, alert, and oriented x3  Pulmonary:  -- No crackles -- Equal breath sounds bilaterally -- Breathing non-labored at rest Cardiovascular:  -- S1, S2 present  -- No pericardial rubs  Breast: -- Post-surgical incisions all  well-approximated without any peri-incisional erythema or drainage -- Right upper medial arm and dorsal hand mildly tender to light touch with trace Right hand edema in comparison to LUE, while forearm non-tender to palpation (confirmed by patient) with sensation intact Musculoskeletal / Integumentary:  -- Wounds or skin discoloration: None appreciated except post-surgical incisions as described above (Right Breast/Axilla) -- Extremities: B/L UE and LE FROM, hands and feet warm   Imaging: No new pertinent imaging available for review  Assessment:  53 y.o. yo Female with a problem list including...  Patient Active Problem List   Diagnosis Date Noted  . Invasive carcinoma of breast ([place]) 03/24/2018  . Goals of care, counseling/discussion 03/24/2018  . Malignant neoplasm of right breast greater than or equal to 2 cm in greatest dimension ([place])   . 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  Plan:   - continue stretching and strengthening exercises  - offered physical therapy and possible RUE compression sleeve, but patient expresses preference to continue without PT x 4 more weeks before considering PT             - discussed pain control strategies, patient expresses preference to continue primarily with ibuprofen              - gradually resume all activities without restrictions over next 2 weeks             -  okay to submerge incisions under water (baths, swimming) prn  - offered follow-up in 2 weeks, but patient again expresses preference to follow up in 4 weeks, which seems reasonable             -  instructed to call office if any questions or concerns  All of the above recommendations were discussed with the patient, and all of patient's questions were answered to her expressed satisfaction.  -- [name] Rosana Hoes, MD, [place]: [facility] - Partnering for exceptional care. Office: [phone]

## 2018-04-11 ENCOUNTER — Encounter: Payer: Self-pay | Admitting: Surgery

## 2018-04-15 DIAGNOSIS — Z510 Encounter for antineoplastic radiation therapy: Secondary | ICD-10-CM | POA: Diagnosis not present

## 2018-04-17 ENCOUNTER — Ambulatory Visit
Admission: RE | Admit: 2018-04-17 | Discharge: 2018-04-17 | Disposition: A | Discharge status: Home / Self Care | Payer: Medicaid Other | Source: Ambulatory Visit | Attending: Radiation Oncology | Admitting: Radiation Oncology

## 2018-04-17 ENCOUNTER — Other Ambulatory Visit: Payer: Self-pay

## 2018-04-17 DIAGNOSIS — Z510 Encounter for antineoplastic radiation therapy: Secondary | ICD-10-CM | POA: Diagnosis not present

## 2018-04-21 ENCOUNTER — Other Ambulatory Visit: Payer: Self-pay

## 2018-04-21 ENCOUNTER — Ambulatory Visit
Admission: RE | Admit: 2018-04-21 | Discharge: 2018-04-21 | Disposition: A | Discharge status: Home / Self Care | Payer: Medicaid Other | Source: Ambulatory Visit | Attending: Radiation Oncology | Admitting: Radiation Oncology

## 2018-04-21 DIAGNOSIS — Z510 Encounter for antineoplastic radiation therapy: Secondary | ICD-10-CM | POA: Diagnosis not present

## 2018-04-22 ENCOUNTER — Other Ambulatory Visit: Payer: Self-pay

## 2018-04-22 ENCOUNTER — Ambulatory Visit: Payer: Medicaid Other | Admitting: Adult Health

## 2018-04-22 ENCOUNTER — Ambulatory Visit
Admission: RE | Admit: 2018-04-22 | Discharge: 2018-04-22 | Disposition: A | Discharge status: Home / Self Care | Payer: Medicaid Other | Source: Ambulatory Visit | Attending: Radiation Oncology | Admitting: Radiation Oncology

## 2018-04-22 ENCOUNTER — Encounter: Payer: Self-pay | Admitting: Adult Health

## 2018-04-22 VITALS — BP 118/84 | HR 98 | Resp 16 | Ht 63.0 in | Wt 218.0 lb

## 2018-04-22 DIAGNOSIS — G4710 Hypersomnia, unspecified: Secondary | ICD-10-CM | POA: Diagnosis not present

## 2018-04-22 DIAGNOSIS — R5383 Other fatigue: Secondary | ICD-10-CM

## 2018-04-22 DIAGNOSIS — R0683 Snoring: Secondary | ICD-10-CM

## 2018-04-22 DIAGNOSIS — F321 Major depressive disorder, single episode, moderate: Secondary | ICD-10-CM | POA: Diagnosis not present

## 2018-04-22 DIAGNOSIS — M5441 Lumbago with sciatica, right side: Secondary | ICD-10-CM

## 2018-04-22 DIAGNOSIS — M5442 Lumbago with sciatica, left side: Secondary | ICD-10-CM

## 2018-04-22 DIAGNOSIS — G8929 Other chronic pain: Secondary | ICD-10-CM

## 2018-04-22 DIAGNOSIS — R062 Wheezing: Secondary | ICD-10-CM

## 2018-04-22 DIAGNOSIS — Z510 Encounter for antineoplastic radiation therapy: Secondary | ICD-10-CM | POA: Diagnosis not present

## 2018-04-22 MED ORDER — ALBUTEROL SULFATE HFA 108 (90 BASE) MCG/ACT IN AERS: 2.0000 | INHALATION_SPRAY | Freq: Four times a day (QID) | RESPIRATORY_TRACT | 0 refills | Status: DC | PRN

## 2018-04-22 MED ORDER — SERTRALINE HCL 50 MG PO TABS: 50.0000 mg | ORAL_TABLET | Freq: Every day | ORAL | 1 refills | Status: DC

## 2018-04-22 NOTE — Patient Instructions (Signed)
Depression Screening Depression screening is a tool that your health care provider can use to learn if you have symptoms of depression. Depression is a common condition with many symptoms that are also often found in other conditions. Depression is treatable, but it must first be diagnosed. You may not know that certain feelings, thoughts, and behaviors that you are having can be symptoms of depression. Taking a depression screening test can help you and your health care provider decide if you need more assessment, or if you should be referred to a mental health care provider. What are the screening tests?  You may have a physical exam to see if another condition is affecting your mental health. You may have a blood or urine sample taken during the physical exam.  You may be interviewed using a screening tool that was developed from research, such as one of these: ? Patient Health Questionnaire (PHQ). This is a set of either 2 or 9 questions. A health care provider who has been trained to score this screening test uses a guide to assess if your symptoms suggest that you may have depression. ? Hamilton Depression Rating Scale (HAM-D). This is a set of either 17 or 24 questions. You may be asked to take it again during or after your treatment, to see if your depression has gotten better. ? Beck Depression Inventory (BDI). This is a set of 21 multiple choice questions. Your health care provider scores your answers to assess:  Your level of depression, ranging from mild to severe.  Your response to treatment.  Your health care provider may talk with you about your daily activities, such as eating, sleeping, work, and recreation, and ask if you have had any changes in activity.  Your health care provider may ask you to see a mental health specialist, such as a psychiatrist or psychologist, for more evaluation. Who should be screened for depression?   All adults, including adults with a family history  of a mental health disorder.  Adolescents who are 12-18 years old.  People who are recovering from a myocardial infarction (MI).  Pregnant women, or women who have given birth.  People who have a long-term (chronic) illness.  Anyone who has been diagnosed with another type of a mental health disorder.  Anyone who has symptoms that could show depression. What do my results mean? Your health care provider will review the results of your depression screening, physical exam, and lab tests. Positive screens suggest that you may have depression. Screening is the first step in getting the care that you may need. It is up to you to get your screening results. Ask your health care provider, or the department that is doing your screening tests, when your results will be ready. Talk with your health care provider about your results and diagnosis. A diagnosis of depression is made using the Diagnostic and Statistical Manual of Mental Disorders (DSM-V). This is a book that lists the number and type of symptoms that must be present for a health care provider to give a specific diagnosis.  Your health care provider may work with you to treat your symptoms of depression, or your health care provider may help you find a mental health provider who can assess, diagnose, and treat your depression. Get help right away if:  You have thoughts about hurting yourself or others. If you ever feel like you may hurt yourself or others, or have thoughts about taking your own life, get help right away. You   can go to your nearest emergency department or call:  Your local emergency services (911 in the [place]  A suicide crisis helpline, such as the National Suicide Prevention Lifeline at 1-800-273-8255. This is open 24 hours a day. Summary  Depression screening is the first step in getting the help that you may need.  If your screening test shows symptoms of depression (is positive), your health care provider may ask  you to see a mental health provider.  Anyone who is age 12 or older should be screened for depression. This information is not intended to replace advice given to you by your health care provider. Make sure you discuss any questions you have with your health care provider. Document Released: 06/08/2016 Document Revised: 06/08/2016 Document Reviewed: 06/08/2016 Elsevier Interactive Patient Education  2019 [employer]  

## 2018-04-22 NOTE — Progress Notes (Signed)
[facility] [place], [place] [address]  Internal MEDICINE  Office Visit Note  Patient Name: Margaret Blackburn  [id number]  [id number]  Date of Service: 04/22/2018   Complaints/HPI Pt is here for establishment of PCP. Chief Complaint  Patient presents with  . Allergies  . COPD  . Depression    new patient [facility] , need medication for depression    HPI Pt is here to establish care with our office.  She has not seen a primary care doctor in many years.  She participated in free breast cancer screening back in October.  They found a malignant lesion and she has since had it removed.  She started her 7 weeks of radiation yesterday.  She reports a history of copd, allergies and depression. Smoking 1.5 PPD, denies alcohol or illicit drug use.  Her depression has become severe and noticeable since her Cancer diagnosis. She has been brave through the beginning but feels very overwhelmed at this time, and would like to take a medicine to help her with these symptoms.  She is tearful in exam room, she feels hopeless and out of control at this time.     Current Medication: Outpatient Encounter Medications as of 04/22/2018  Medication Sig  . ibuprofen (ADVIL,MOTRIN) 200 MG tablet Take 800 mg by mouth every 6 (six) hours as needed for headache or moderate pain.  . [DISCONTINUED] naproxen sodium (ALEVE) 220 MG tablet Take 440 mg by mouth daily as needed (for pain or headache).  Marland Kitchen albuterol (PROVENTIL HFA;VENTOLIN HFA) 108 (90 Base) MCG/ACT inhaler Inhale 2 puffs into the lungs every 6 (six) hours as needed for wheezing or shortness of breath.  . sertraline (ZOLOFT) 50 MG tablet Take 1 tablet (50 mg total) by mouth daily.   No facility-administered encounter medications on file as of 04/22/2018.     Surgical History: Past Surgical History:  Procedure Laterality Date  . ABDOMINAL HYSTERECTOMY    . BACK SURGERY    . BREAST BIOPSY Right 12/25/2017   affirm bx , x  clip,TUBULAR  CARCINOMA  . 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 BREAST LUMPECTOMY WITH NEEDLE LOCALIZATION Right 03/03/2018   Procedure: BREAST LUMPECTOMY WITH NEEDLE LOCALIZATION;  Surgeon: Vickie Epley, MD;  Location: [facility] ORS;  Service: General;  Laterality: Right;  . SENTINEL NODE BIOPSY Right 03/21/2018   Procedure: RIGHT SENTINEL LYMPH  NODE BIOPSY;  Surgeon: Vickie Epley, MD;  Location: [facility] ORS;  Service: General;  Laterality: Right;    Medical History: Past Medical History:  Diagnosis Date  . Allergy   . Arthritis   . COPD (chronic obstructive pulmonary disease) ([place])   . Depression   . Sleep apnea     Family History: Family History  Problem Relation Age of Onset  . Breast cancer Mother 42  . Lung cancer Mother   . Breast cancer Maternal Grandmother   . Thyroid cancer Father     Social History   Socioeconomic History  . Marital status: Single    Spouse name: Not on file  . Number of children: 2  . Years of education: Not on file  . Highest education level: Not on file  Occupational History  . Not on file  Social Needs  . Financial resource strain: Not on file  . Food insecurity:    Worry: Not on file    Inability: Not on file  . Transportation needs:    Medical: Not on file  Non-medical: Not on file  Tobacco Use  . Smoking status: Current Every Day Smoker    Packs/day: 1.00    Years: 30.00    Pack years: 30.00    Types: Cigarettes  . Smokeless tobacco: Never Used  Substance and Sexual Activity  . Alcohol use: No  . Drug use: Not Currently    Types: Marijuana    Comment: past  . Sexual activity: Not on file  Lifestyle  . Physical activity:    Days per week: Not on file    Minutes per session: Not on file  . Stress: Not on file  Relationships  . Social connections:    Talks on phone: Not on file    Gets together: Not on file    Attends religious service: Not on file    Active member of club or organization: Not  on file    Attends meetings of clubs or organizations: Not on file    Relationship status: Not on file  . Intimate partner violence:    Fear of current or ex partner: Not on file    Emotionally abused: Not on file    Physically abused: Not on file    Forced sexual activity: Not on file  Other Topics Concern  . Not on file  Social History Narrative  . Not on file     Review of Systems  Constitutional: Negative for chills, fatigue and unexpected weight change.  HENT: Negative for congestion, rhinorrhea, sneezing and sore throat.   Eyes: Negative for photophobia, pain and redness.  Respiratory: Negative for cough, chest tightness and shortness of breath.   Cardiovascular: Negative for chest pain and palpitations.  Gastrointestinal: Negative for abdominal pain, constipation, diarrhea, nausea and vomiting.  Endocrine: Negative.   Genitourinary: Negative for dysuria and frequency.  Musculoskeletal: Negative for arthralgias, back pain, joint swelling and neck pain.  Skin: Negative for rash.  Allergic/Immunologic: Negative.   Neurological: Negative for tremors and numbness.  Hematological: Negative for adenopathy. Does not bruise/bleed easily.  Psychiatric/Behavioral: Positive for dysphoric mood. Negative for behavioral problems and sleep disturbance. The patient is not nervous/anxious.     Vital Signs: BP 118/84   Pulse 98   Resp 16   Ht 5\' 3"  (1.6 m)   Wt 218 lb (98.9 kg)   SpO2 95%   BMI 38.62 kg/m    Physical Exam Vitals signs and nursing note reviewed.  Constitutional:      General: She is not in acute distress.    Appearance: She is well-developed. She is not diaphoretic.  HENT:     Head: Normocephalic and atraumatic.     Mouth/Throat:     Pharynx: No oropharyngeal exudate.  Eyes:     Pupils: Pupils are equal, round, and reactive to light.  Neck:     Musculoskeletal: Normal range of motion and neck supple.     Thyroid: No thyromegaly.     Vascular: No JVD.      Trachea: No tracheal deviation.  Cardiovascular:     Rate and Rhythm: Normal rate and regular rhythm.     Heart sounds: Normal heart sounds. No murmur. No friction rub. No gallop.   Pulmonary:     Effort: Pulmonary effort is normal. No respiratory distress.     Breath sounds: Normal breath sounds. No wheezing or rales.  Chest:     Chest wall: No tenderness.  Abdominal:     Palpations: Abdomen is soft.     Tenderness: There is no abdominal  tenderness. There is no guarding.  Musculoskeletal: Normal range of motion.  Lymphadenopathy:     Cervical: No cervical adenopathy.  Skin:    General: Skin is warm and dry.  Neurological:     Mental Status: She is alert and oriented to person, place, and time.     Cranial Nerves: No cranial nerve deficit.  Psychiatric:        Behavior: Behavior normal.        Thought Content: Thought content normal.        Judgment: Judgment normal.    Assessment/Plan: 1. Depression, major, single episode, moderate ([place]) Start patient on 50mg  of zoloft. Educated patient that full affect of medicine would not be seen for 6-8 weeks.  We will follow up in 6 weeks to reassess.  Also informed patient that she may not stop this medicine without tapering. She verbalized understanding.  - sertraline (ZOLOFT) 50 MG tablet; Take 1 tablet (50 mg total) by mouth daily.  Dispense: 30 tablet; Refill: 1  2. Fatigue, unspecified type Fatigue labs ordered.Will follow up when results are available.  - B12 and Folate Panel - Fe+TIBC+Fer - Vitamin D 1,25 dihydroxy - TSH + free T4  3. Chronic midline low back pain with bilateral sciatica PT is concerned about her pain.  We discussed the role that uncontrolled depression has in pain management. We will reassess the need for pain intervention once depression medication is titrated appropriately.   4. Hypersomnolence PT has classic hypersomnolence, as well as loud snoring and excessive fatigue.  Will get baseline sleep study and  follow up with patient.  - PSG SLEEP STUDY; Future  5. Loud snoring - PSG SLEEP STUDY; Future  6. Wheezing Pt provided with albuterol inhaler at this time.  - albuterol (PROVENTIL HFA;VENTOLIN HFA) 108 (90 Base) MCG/ACT inhaler; Inhale 2 puffs into the lungs every 6 (six) hours as needed for wheezing or shortness of breath.  Dispense: 1 Inhaler; Refill: 0  General Counseling: Noora verbalizes understanding of the findings of todays visit and agrees with plan of treatment. I have discussed any further diagnostic evaluation that may be needed or ordered today. We also reviewed her medications today. she has been encouraged to call the office with any questions or concerns that should arise related to todays visit.  Orders Placed This Encounter  Procedures  . B12 and Folate Panel  . Fe+TIBC+Fer  . Vitamin D 1,25 dihydroxy  . TSH + free T4  . PSG SLEEP STUDY    Meds ordered this encounter  Medications  . sertraline (ZOLOFT) 50 MG tablet    Sig: Take 1 tablet (50 mg total) by mouth daily.    Dispense:  30 tablet    Refill:  1  . albuterol (PROVENTIL HFA;VENTOLIN HFA) 108 (90 Base) MCG/ACT inhaler    Sig: Inhale 2 puffs into the lungs every 6 (six) hours as needed for wheezing or shortness of breath.    Dispense:  1 Inhaler    Refill:  0    Time spent: 25 Minutes   This patient was seen by Orson Gear AGNP-C in Collaboration with Dr Lavera Guise as a part of collaborative care agreement  Kendell Bane AGNP-C Internal Medicine

## 2018-04-23 ENCOUNTER — Ambulatory Visit
Admission: RE | Admit: 2018-04-23 | Discharge: 2018-04-23 | Disposition: A | Discharge status: Home / Self Care | Payer: Medicaid Other | Source: Ambulatory Visit | Attending: Radiation Oncology | Admitting: Radiation Oncology

## 2018-04-23 ENCOUNTER — Other Ambulatory Visit: Payer: Self-pay

## 2018-04-23 DIAGNOSIS — Z510 Encounter for antineoplastic radiation therapy: Secondary | ICD-10-CM | POA: Diagnosis not present

## 2018-04-24 ENCOUNTER — Other Ambulatory Visit: Payer: Self-pay

## 2018-04-24 ENCOUNTER — Ambulatory Visit
Admission: RE | Admit: 2018-04-24 | Discharge: 2018-04-24 | Disposition: A | Discharge status: Home / Self Care | Payer: Medicaid Other | Source: Ambulatory Visit | Attending: Radiation Oncology | Admitting: Radiation Oncology

## 2018-04-24 DIAGNOSIS — Z510 Encounter for antineoplastic radiation therapy: Secondary | ICD-10-CM | POA: Diagnosis not present

## 2018-04-25 ENCOUNTER — Other Ambulatory Visit: Payer: Self-pay

## 2018-04-25 ENCOUNTER — Ambulatory Visit
Admission: RE | Admit: 2018-04-25 | Discharge: 2018-04-25 | Disposition: A | Discharge status: Home / Self Care | Payer: Medicaid Other | Source: Ambulatory Visit | Attending: Radiation Oncology | Admitting: Radiation Oncology

## 2018-04-25 DIAGNOSIS — Z510 Encounter for antineoplastic radiation therapy: Secondary | ICD-10-CM | POA: Diagnosis not present

## 2018-04-26 LAB — IRON,TIBC AND FERRITIN PANEL
Ferritin: 87 ng/mL (ref 15–150)
Iron Saturation: 14 % — ABNORMAL LOW (ref 15–55)
Iron: 51 ug/dL (ref 27–159)
Total Iron Binding Capacity: 354 ug/dL (ref 250–450)
UIBC: 303 ug/dL (ref 131–425)

## 2018-04-26 LAB — B12 AND FOLATE PANEL
Folate: 8.8 ng/mL (ref >3.0)
Vitamin B-12: 260 pg/mL (ref 232–1245)

## 2018-04-26 LAB — TSH+FREE T4
Free T4: 1.18 ng/dL (ref 0.82–1.77)
TSH: 0.8 u[IU]/mL (ref 0.450–4.500)

## 2018-04-26 LAB — VITAMIN D 1,25 DIHYDROXY
Vitamin D 1, 25 (OH)2 Total: 26 pg/mL
Vitamin D2 1, 25 (OH)2: <10 pg/mL
Vitamin D3 1, 25 (OH)2: 24 pg/mL

## 2018-04-27 ENCOUNTER — Other Ambulatory Visit: Payer: Self-pay

## 2018-04-28 ENCOUNTER — Ambulatory Visit: Payer: Medicaid Other

## 2018-04-29 ENCOUNTER — Ambulatory Visit: Payer: Medicaid Other

## 2018-04-30 ENCOUNTER — Ambulatory Visit: Payer: Medicaid Other

## 2018-04-30 ENCOUNTER — Other Ambulatory Visit: Payer: Self-pay | Admitting: *Deleted

## 2018-04-30 ENCOUNTER — Other Ambulatory Visit: Payer: Medicaid Other | Admitting: Internal Medicine

## 2018-04-30 DIAGNOSIS — C50919 Malignant neoplasm of unspecified site of unspecified female breast: Secondary | ICD-10-CM

## 2018-05-01 ENCOUNTER — Ambulatory Visit
Admission: RE | Admit: 2018-05-01 | Discharge: 2018-05-01 | Disposition: A | Discharge status: Home / Self Care | Payer: Medicaid Other | Source: Ambulatory Visit | Attending: Radiation Oncology | Admitting: Radiation Oncology

## 2018-05-01 ENCOUNTER — Other Ambulatory Visit: Payer: Self-pay

## 2018-05-01 DIAGNOSIS — Z510 Encounter for antineoplastic radiation therapy: Secondary | ICD-10-CM | POA: Diagnosis not present

## 2018-05-02 ENCOUNTER — Ambulatory Visit
Admission: RE | Admit: 2018-05-02 | Discharge: 2018-05-02 | Disposition: A | Discharge status: Home / Self Care | Payer: Medicaid Other | Source: Ambulatory Visit | Attending: Radiation Oncology | Admitting: Radiation Oncology

## 2018-05-02 ENCOUNTER — Other Ambulatory Visit: Payer: Self-pay

## 2018-05-02 DIAGNOSIS — Z510 Encounter for antineoplastic radiation therapy: Secondary | ICD-10-CM | POA: Diagnosis not present

## 2018-05-05 ENCOUNTER — Inpatient Hospital Stay: Payer: Medicaid Other | Attending: Oncology

## 2018-05-05 ENCOUNTER — Ambulatory Visit
Admission: RE | Admit: 2018-05-05 | Discharge: 2018-05-05 | Disposition: A | Discharge status: Home / Self Care | Payer: Medicaid Other | Source: Ambulatory Visit | Attending: Radiation Oncology | Admitting: Radiation Oncology

## 2018-05-05 ENCOUNTER — Other Ambulatory Visit: Payer: Self-pay

## 2018-05-05 DIAGNOSIS — Z510 Encounter for antineoplastic radiation therapy: Secondary | ICD-10-CM | POA: Diagnosis not present

## 2018-05-05 DIAGNOSIS — C50911 Malignant neoplasm of unspecified site of right female breast: Secondary | ICD-10-CM | POA: Insufficient documentation

## 2018-05-05 DIAGNOSIS — Z170 Estrogen receptor positive status [ER+]: Secondary | ICD-10-CM | POA: Diagnosis not present

## 2018-05-05 DIAGNOSIS — C50919 Malignant neoplasm of unspecified site of unspecified female breast: Secondary | ICD-10-CM

## 2018-05-05 LAB — CBC WITH DIFFERENTIAL/PLATELET
Abs Immature Granulocytes: 0.06 10*3/uL (ref 0.00–0.07)
Basophils Absolute: 0.1 10*3/uL (ref 0.0–0.1)
Basophils Relative: 1 %
Eosinophils Absolute: 0.2 10*3/uL (ref 0.0–0.5)
Eosinophils Relative: 2 %
HCT: 40.4 % (ref 36.0–46.0)
Hemoglobin: 13.6 g/dL (ref 12.0–15.0)
Immature Granulocytes: 1 %
LYMPHS PCT: 22 %
Lymphs Abs: 2.5 10*3/uL (ref 0.7–4.0)
MCH: 30.4 pg (ref 26.0–34.0)
MCHC: 33.7 g/dL (ref 30.0–36.0)
MCV: 90.4 fL (ref 80.0–100.0)
Monocytes Absolute: 0.6 10*3/uL (ref 0.1–1.0)
Monocytes Relative: 6 %
Neutro Abs: 7.5 10*3/uL (ref 1.7–7.7)
Neutrophils Relative %: 68 %
Platelets: 352 10*3/uL (ref 150–400)
RBC: 4.47 MIL/uL (ref 3.87–5.11)
RDW: 13.5 % (ref 11.5–15.5)
WBC: 10.9 10*3/uL — ABNORMAL HIGH (ref 4.0–10.5)
nRBC: 0 % (ref 0.0–0.2)

## 2018-05-06 ENCOUNTER — Other Ambulatory Visit: Payer: Self-pay

## 2018-05-06 ENCOUNTER — Ambulatory Visit
Admission: RE | Admit: 2018-05-06 | Discharge: 2018-05-06 | Disposition: A | Discharge status: Home / Self Care | Payer: Medicaid Other | Source: Ambulatory Visit | Attending: Radiation Oncology | Admitting: Radiation Oncology

## 2018-05-06 DIAGNOSIS — Z510 Encounter for antineoplastic radiation therapy: Secondary | ICD-10-CM | POA: Diagnosis not present

## 2018-05-07 ENCOUNTER — Encounter: Payer: Self-pay | Admitting: Adult Health

## 2018-05-07 ENCOUNTER — Other Ambulatory Visit: Payer: Self-pay

## 2018-05-07 ENCOUNTER — Ambulatory Visit
Admission: RE | Admit: 2018-05-07 | Discharge: 2018-05-07 | Disposition: A | Discharge status: Home / Self Care | Payer: Medicaid Other | Source: Ambulatory Visit | Attending: Radiation Oncology | Admitting: Radiation Oncology

## 2018-05-07 DIAGNOSIS — Z510 Encounter for antineoplastic radiation therapy: Secondary | ICD-10-CM | POA: Diagnosis present

## 2018-05-07 DIAGNOSIS — Z170 Estrogen receptor positive status [ER+]: Secondary | ICD-10-CM | POA: Diagnosis not present

## 2018-05-07 DIAGNOSIS — C50911 Malignant neoplasm of unspecified site of right female breast: Secondary | ICD-10-CM | POA: Diagnosis not present

## 2018-05-08 ENCOUNTER — Other Ambulatory Visit: Payer: Self-pay

## 2018-05-08 ENCOUNTER — Encounter: Payer: Medicaid Other | Admitting: Surgery

## 2018-05-08 ENCOUNTER — Ambulatory Visit
Admission: RE | Admit: 2018-05-08 | Discharge: 2018-05-08 | Disposition: A | Discharge status: Home / Self Care | Payer: Medicaid Other | Source: Ambulatory Visit | Attending: Radiation Oncology | Admitting: Radiation Oncology

## 2018-05-08 DIAGNOSIS — Z510 Encounter for antineoplastic radiation therapy: Secondary | ICD-10-CM | POA: Diagnosis not present

## 2018-05-09 ENCOUNTER — Telehealth (INDEPENDENT_AMBULATORY_CARE_PROVIDER_SITE_OTHER): Payer: Medicaid Other | Admitting: Surgery

## 2018-05-09 ENCOUNTER — Ambulatory Visit
Admission: RE | Admit: 2018-05-09 | Discharge: 2018-05-09 | Disposition: A | Discharge status: Home / Self Care | Payer: Medicaid Other | Source: Ambulatory Visit | Attending: Radiation Oncology | Admitting: Radiation Oncology

## 2018-05-09 ENCOUNTER — Other Ambulatory Visit: Payer: Self-pay

## 2018-05-09 DIAGNOSIS — Z510 Encounter for antineoplastic radiation therapy: Secondary | ICD-10-CM | POA: Diagnosis not present

## 2018-05-09 DIAGNOSIS — Z4889 Encounter for other specified surgical aftercare: Secondary | ICD-10-CM

## 2018-05-09 NOTE — Progress Notes (Signed)
Referring provider:  No referring provider defined for this encounter.  Virtual Visit via Telephone Note  I connected with Ms. Djordjevic by telephone at her home on 05/09/18 at 10:15 AM EDT and verified that I was speaking with the correct person using their name and date of birth.   I discussed the limitations, risks, security and privacy concerns of performing an evaluation and management service by telephone telemedicine and the availability of in person appointments. I also discussed with the patient that there may be a patient responsible charge related to this service. The patient expressed understanding and agreed to proceed.  History of Present Illness: 53 y.o. 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 Patient reports complete resolution of the peri-operative RUE "electrical pain" and near-complete resolution of her minimal residual RUE edema, though she describes her Right upper medial arm numbness has been more slowly improving as well. She expresses she is very satisfied with how her post-surgical incisional wounds have been healing without any surrounding redness or associated drainage, purulent or otherwise. She otherwise denies N/V, fever/chills, CP, or SOB.  Review of Systems:  Constitutional: denies fever/chills  Respiratory: denies shortness of breath, wheezing  Cardiovascular: denies chest pain, palpitations  Breast and Musculoskeletal: breast and RUE pain, edema, and paresthesias as per interval history Skin: Denies any other rashes or skin discolorations except post-surgical wounds as per interval history  Imaging: No new pertinent imaging available for review at this time   Assessment:  53 y.o. yo Female with a problem list  including...  Patient Active Problem List   Diagnosis Date Noted  . Invasive carcinoma of breast ([place]) 03/24/2018  . Goals of care, counseling/discussion 03/24/2018  . Malignant neoplasm of right breast greater than or equal to 2 cm in greatest dimension ([place])   . 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  Follow-up Instructions / Plan:   - resume all activities without restrictions             - continue stretching and strengthening exercises - okay to submerge incisions under water (baths, swimming) prn             - will order/schedule follow-up Right breast diagnostic mammogram and subsequent appointment 6 months s/p recent Right breast lumpectomy - instructed to call office if any questions or concerns  All of the above recommendations were discussed with the patient, and all of patient's questions were answered to her expressed satisfaction.  The patient was advised to call back or seek an in-person evaluation if the symptoms worsen or if the condition fails to improve as anticipated.  From [facility] outpatient surgery office, I provided 15 minutes of non-face-to-face time during this encounter.  -- [name] Rosana Hoes, MD, [place]: [facility] - Partnering for exceptional care. Office: [phone]

## 2018-05-11 ENCOUNTER — Ambulatory Visit: Payer: Medicaid Other

## 2018-05-12 ENCOUNTER — Other Ambulatory Visit: Payer: Self-pay

## 2018-05-12 ENCOUNTER — Ambulatory Visit: Payer: Medicaid Other

## 2018-05-13 ENCOUNTER — Ambulatory Visit: Payer: Medicaid Other

## 2018-05-14 ENCOUNTER — Other Ambulatory Visit: Payer: Self-pay

## 2018-05-14 ENCOUNTER — Ambulatory Visit: Payer: Medicaid Other

## 2018-05-15 ENCOUNTER — Ambulatory Visit: Payer: Medicaid Other | Admitting: Internal Medicine

## 2018-05-15 ENCOUNTER — Ambulatory Visit: Payer: Medicaid Other

## 2018-05-16 ENCOUNTER — Ambulatory Visit: Payer: Medicaid Other

## 2018-05-18 ENCOUNTER — Other Ambulatory Visit: Payer: Self-pay

## 2018-05-19 ENCOUNTER — Ambulatory Visit
Admission: RE | Admit: 2018-05-19 | Discharge: 2018-05-19 | Disposition: A | Discharge status: Home / Self Care | Payer: Medicaid Other | Source: Ambulatory Visit | Attending: Radiation Oncology | Admitting: Radiation Oncology

## 2018-05-19 ENCOUNTER — Other Ambulatory Visit: Payer: Self-pay

## 2018-05-19 ENCOUNTER — Encounter: Payer: Self-pay | Admitting: Adult Health

## 2018-05-19 ENCOUNTER — Ambulatory Visit: Payer: Medicaid Other

## 2018-05-19 ENCOUNTER — Ambulatory Visit: Payer: Medicaid Other | Admitting: Adult Health

## 2018-05-19 ENCOUNTER — Inpatient Hospital Stay: Payer: Medicaid Other | Attending: Oncology

## 2018-05-19 VITALS — Ht 63.0 in | Wt 210.0 lb

## 2018-05-19 DIAGNOSIS — F17200 Nicotine dependence, unspecified, uncomplicated: Secondary | ICD-10-CM

## 2018-05-19 DIAGNOSIS — C50911 Malignant neoplasm of unspecified site of right female breast: Secondary | ICD-10-CM

## 2018-05-19 DIAGNOSIS — F321 Major depressive disorder, single episode, moderate: Secondary | ICD-10-CM | POA: Diagnosis not present

## 2018-05-19 DIAGNOSIS — Z510 Encounter for antineoplastic radiation therapy: Secondary | ICD-10-CM | POA: Diagnosis not present

## 2018-05-19 DIAGNOSIS — C50919 Malignant neoplasm of unspecified site of unspecified female breast: Secondary | ICD-10-CM

## 2018-05-19 LAB — CBC WITH DIFFERENTIAL/PLATELET
Abs Immature Granulocytes: 0.05 10*3/uL (ref 0.00–0.07)
Basophils Absolute: 0.1 10*3/uL (ref 0.0–0.1)
Basophils Relative: 1 %
Eosinophils Absolute: 0.3 10*3/uL (ref 0.0–0.5)
Eosinophils Relative: 3 %
HCT: 40.9 % (ref 36.0–46.0)
Hemoglobin: 13.8 g/dL (ref 12.0–15.0)
Immature Granulocytes: 1 %
Lymphocytes Relative: 22 %
Lymphs Abs: 2.3 10*3/uL (ref 0.7–4.0)
MCH: 30.6 pg (ref 26.0–34.0)
MCHC: 33.7 g/dL (ref 30.0–36.0)
MCV: 90.7 fL (ref 80.0–100.0)
Monocytes Absolute: 0.7 10*3/uL (ref 0.1–1.0)
Monocytes Relative: 7 %
Neutro Abs: 7.3 10*3/uL (ref 1.7–7.7)
Neutrophils Relative %: 66 %
Platelets: 318 10*3/uL (ref 150–400)
RBC: 4.51 MIL/uL (ref 3.87–5.11)
RDW: 14 % (ref 11.5–15.5)
WBC: 10.8 10*3/uL — ABNORMAL HIGH (ref 4.0–10.5)
nRBC: 0 % (ref 0.0–0.2)

## 2018-05-19 MED ORDER — SERTRALINE HCL 50 MG PO TABS: 50.0000 mg | ORAL_TABLET | Freq: Every day | ORAL | 1 refills | Status: DC

## 2018-05-19 NOTE — Progress Notes (Signed)
[facility] [place], [place] [address]  Internal MEDICINE  Telephone Visit  Patient Name: Margaret Blackburn  [id number]  [id number]  Date of Service: 05/19/2018  I connected with the patient at 1010 by telephone and verified the patients identity using two identifiers. I discussed the limitations, risks, security and privacy concerns of performing an evaluation and management service by telephone and the availability of in person appointments. I also discussed with the patient that there may be a patient responsible charge related to the service.  The patient expressed understanding and agrees to proceed.    Chief Complaint  Patient presents with  . Medical Management of Chronic Issues    medication refills  . Depression    HPI Pt being seen today for follow up on new medication.  She was placed on zoloft one month ago.  She reports excellent relief of her symptoms.  She feels like she is better equipped to handle the stress of her breast cancer treatment, and the rest of her daily life activities.  She denies any side effects at this time.      Current Medication: Outpatient Encounter Medications as of 05/19/2018  Medication Sig  . albuterol (PROVENTIL HFA;VENTOLIN HFA) 108 (90 Base) MCG/ACT inhaler Inhale 2 puffs into the lungs every 6 (six) hours as needed for wheezing or shortness of breath.  Marland Kitchen ibuprofen (ADVIL,MOTRIN) 200 MG tablet Take 800 mg by mouth every 6 (six) hours as needed for headache or moderate pain.  Marland Kitchen sertraline (ZOLOFT) 50 MG tablet Take 1 tablet (50 mg total) by mouth daily.  . [DISCONTINUED] sertraline (ZOLOFT) 50 MG tablet Take 1 tablet (50 mg total) by mouth daily.   No facility-administered encounter medications on file as of 05/19/2018.     Surgical History: Past Surgical History:  Procedure Laterality Date  . ABDOMINAL HYSTERECTOMY    . BACK SURGERY    . BREAST BIOPSY Right 12/25/2017   affirm bx , x clip,TUBULAR  CARCINOMA  .  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 BREAST LUMPECTOMY WITH NEEDLE LOCALIZATION Right 03/03/2018   Procedure: BREAST LUMPECTOMY WITH NEEDLE LOCALIZATION;  Surgeon: Vickie Epley, MD;  Location: [facility] ORS;  Service: General;  Laterality: Right;  . SENTINEL NODE BIOPSY Right 03/21/2018   Procedure: RIGHT SENTINEL LYMPH  NODE BIOPSY;  Surgeon: Vickie Epley, MD;  Location: [facility] ORS;  Service: General;  Laterality: Right;    Medical History: Past Medical History:  Diagnosis Date  . Allergy   . Arthritis   . COPD (chronic obstructive pulmonary disease) ([place])   . Depression   . Sleep apnea     Family History: Family History  Problem Relation Age of Onset  . Breast cancer Mother 60  . Lung cancer Mother   . Breast cancer Maternal Grandmother   . Thyroid cancer Father     Social History   Socioeconomic History  . Marital status: Single    Spouse name: Not on file  . Number of children: 2  . Years of education: Not on file  . Highest education level: Not on file  Occupational History  . Not on file  Social Needs  . Financial resource strain: Not on file  . Food insecurity:    Worry: Not on file    Inability: Not on file  . Transportation needs:    Medical: Not on file    Non-medical: Not on file  Tobacco Use  . Smoking status: Current  Every Day Smoker    Packs/day: 1.00    Years: 30.00    Pack years: 30.00    Types: Cigarettes  . Smokeless tobacco: Never Used  Substance and Sexual Activity  . Alcohol use: No  . Drug use: Not Currently    Types: Marijuana    Comment: past  . Sexual activity: Not on file  Lifestyle  . Physical activity:    Days per week: Not on file    Minutes per session: Not on file  . Stress: Not on file  Relationships  . Social connections:    Talks on phone: Not on file    Gets together: Not on file    Attends religious service: Not on file    Active member of club or organization: Not on file    Attends  meetings of clubs or organizations: Not on file    Relationship status: Not on file  . Intimate partner violence:    Fear of current or ex partner: Not on file    Emotionally abused: Not on file    Physically abused: Not on file    Forced sexual activity: Not on file  Other Topics Concern  . Not on file  Social History Narrative  . Not on file      Review of Systems  Constitutional: Negative for chills, fatigue and unexpected weight change.  HENT: Negative for congestion, rhinorrhea, sneezing and sore throat.   Eyes: Negative for photophobia, pain and redness.  Respiratory: Negative for cough, chest tightness and shortness of breath.   Cardiovascular: Negative for chest pain and palpitations.  Gastrointestinal: Negative for abdominal pain, constipation, diarrhea, nausea and vomiting.  Endocrine: Negative.   Genitourinary: Negative for dysuria and frequency.  Musculoskeletal: Negative for arthralgias, back pain, joint swelling and neck pain.  Skin: Negative for rash.  Allergic/Immunologic: Negative.   Neurological: Negative for tremors and numbness.  Hematological: Negative for adenopathy. Does not bruise/bleed easily.  Psychiatric/Behavioral: Negative for behavioral problems and sleep disturbance. The patient is not nervous/anxious.     Vital Signs: Ht 5\' 3"  (1.6 m)   Wt 210 lb (95.3 kg)   BMI 37.20 kg/m    Observation/Objective:     Assessment/Plan: 1. Depression, major, single episode, moderate ([facility]) Refilled patients zoloft for 90 days.  Will follow up with patient in 8 weeks.   - sertraline (ZOLOFT) 50 MG tablet; Take 1 tablet (50 mg total) by mouth daily.  Dispense: 90 tablet; Refill: 1  2. Nicotine dependence with current use unfortunately she continues to smoke.   Smoking cessation counseling: 1. Pt acknowledges the risks of long term smoking, she will try to quite smoking. 2. Options for different medications including nicotine products, chewing gum, patch  etc, Wellbutrin and Chantix is discussed 3. Goal and date of compete cessation is discussed 4. Total time spent in smoking cessation is 15 min.   3. Malignant neoplasm of right breast greater than or equal to 2 cm in greatest dimension [facility]) Currently in treatment.  Continue with oncology appts as scheduled.   General Counseling: Margaret Blackburn verbalizes understanding of the findings of today's phone visit and agrees with plan of treatment. I have discussed any further diagnostic evaluation that may be needed or ordered today. We also reviewed her medications today. she has been encouraged to call the office with any questions or concerns that should arise related to todays visit.    No orders of the defined types were placed in this encounter.   Meds  ordered this encounter  Medications  . sertraline (ZOLOFT) 50 MG tablet    Sig: Take 1 tablet (50 mg total) by mouth daily.    Dispense:  90 tablet    Refill:  1    Time spent: [place] AGNP-C Internal medicine

## 2018-05-19 NOTE — Patient Instructions (Signed)
Depression Screening Depression screening is a tool that your health care provider can use to learn if you have symptoms of depression. Depression is a common condition with many symptoms that are also often found in other conditions. Depression is treatable, but it must first be diagnosed. You may not know that certain feelings, thoughts, and behaviors that you are having can be symptoms of depression. Taking a depression screening test can help you and your health care provider decide if you need more assessment, or if you should be referred to a mental health care provider. What are the screening tests?  You may have a physical exam to see if another condition is affecting your mental health. You may have a blood or urine sample taken during the physical exam.  You may be interviewed using a screening tool that was developed from research, such as one of these: ? Patient Health Questionnaire (PHQ). This is a set of either 2 or 9 questions. A health care provider who has been trained to score this screening test uses a guide to assess if your symptoms suggest that you may have depression. ? Hamilton Depression Rating Scale (HAM-D). This is a set of either 17 or 24 questions. You may be asked to take it again during or after your treatment, to see if your depression has gotten better. ? Beck Depression Inventory (BDI). This is a set of 21 multiple choice questions. Your health care provider scores your answers to assess:  Your level of depression, ranging from mild to severe.  Your response to treatment.  Your health care provider may talk with you about your daily activities, such as eating, sleeping, work, and recreation, and ask if you have had any changes in activity.  Your health care provider may ask you to see a mental health specialist, such as a psychiatrist or psychologist, for more evaluation. Who should be screened for depression?   All adults, including adults with a family history  of a mental health disorder.  Adolescents who are 12-18 years old.  People who are recovering from a myocardial infarction (MI).  Pregnant women, or women who have given birth.  People who have a long-term (chronic) illness.  Anyone who has been diagnosed with another type of a mental health disorder.  Anyone who has symptoms that could show depression. What do my results mean? Your health care provider will review the results of your depression screening, physical exam, and lab tests. Positive screens suggest that you may have depression. Screening is the first step in getting the care that you may need. It is up to you to get your screening results. Ask your health care provider, or the department that is doing your screening tests, when your results will be ready. Talk with your health care provider about your results and diagnosis. A diagnosis of depression is made using the Diagnostic and Statistical Manual of Mental Disorders (DSM-V). This is a book that lists the number and type of symptoms that must be present for a health care provider to give a specific diagnosis.  Your health care provider may work with you to treat your symptoms of depression, or your health care provider may help you find a mental health provider who can assess, diagnose, and treat your depression. Get help right away if:  You have thoughts about hurting yourself or others. If you ever feel like you may hurt yourself or others, or have thoughts about taking your own life, get help right away. You   can go to your nearest emergency department or call:  Your local emergency services (911 in the [place]  A suicide crisis helpline, such as the National Suicide Prevention Lifeline at 1-800-273-8255. This is open 24 hours a day. Summary  Depression screening is the first step in getting the help that you may need.  If your screening test shows symptoms of depression (is positive), your health care provider may ask  you to see a mental health provider.  Anyone who is age 12 or older should be screened for depression. This information is not intended to replace advice given to you by your health care provider. Make sure you discuss any questions you have with your health care provider. Document Released: 06/08/2016 Document Revised: 06/08/2016 Document Reviewed: 06/08/2016 Elsevier Interactive Patient Education  2019 [employer]  

## 2018-05-20 ENCOUNTER — Ambulatory Visit: Payer: Medicaid Other

## 2018-05-20 ENCOUNTER — Ambulatory Visit
Admission: RE | Admit: 2018-05-20 | Discharge: 2018-05-20 | Disposition: A | Discharge status: Home / Self Care | Payer: Medicaid Other | Source: Ambulatory Visit | Attending: Radiation Oncology | Admitting: Radiation Oncology

## 2018-05-20 ENCOUNTER — Other Ambulatory Visit: Payer: Self-pay

## 2018-05-20 DIAGNOSIS — Z510 Encounter for antineoplastic radiation therapy: Secondary | ICD-10-CM | POA: Diagnosis not present

## 2018-05-21 ENCOUNTER — Ambulatory Visit
Admission: RE | Admit: 2018-05-21 | Discharge: 2018-05-21 | Disposition: A | Discharge status: Home / Self Care | Payer: Medicaid Other | Source: Ambulatory Visit | Attending: Radiation Oncology | Admitting: Radiation Oncology

## 2018-05-21 ENCOUNTER — Other Ambulatory Visit: Payer: Self-pay

## 2018-05-21 DIAGNOSIS — Z510 Encounter for antineoplastic radiation therapy: Secondary | ICD-10-CM | POA: Diagnosis not present

## 2018-05-22 ENCOUNTER — Other Ambulatory Visit: Payer: Self-pay

## 2018-05-22 ENCOUNTER — Ambulatory Visit
Admission: RE | Admit: 2018-05-22 | Discharge: 2018-05-22 | Disposition: A | Discharge status: Home / Self Care | Payer: Medicaid Other | Source: Ambulatory Visit | Attending: Radiation Oncology | Admitting: Radiation Oncology

## 2018-05-22 DIAGNOSIS — Z510 Encounter for antineoplastic radiation therapy: Secondary | ICD-10-CM | POA: Diagnosis not present

## 2018-05-23 ENCOUNTER — Other Ambulatory Visit: Payer: Self-pay

## 2018-05-23 ENCOUNTER — Ambulatory Visit
Admission: RE | Admit: 2018-05-23 | Discharge: 2018-05-23 | Disposition: A | Discharge status: Home / Self Care | Payer: Medicaid Other | Source: Ambulatory Visit | Attending: Radiation Oncology | Admitting: Radiation Oncology

## 2018-05-23 DIAGNOSIS — Z510 Encounter for antineoplastic radiation therapy: Secondary | ICD-10-CM | POA: Diagnosis not present

## 2018-05-26 ENCOUNTER — Ambulatory Visit
Admission: RE | Admit: 2018-05-26 | Discharge: 2018-05-26 | Disposition: A | Discharge status: Home / Self Care | Payer: Medicaid Other | Source: Ambulatory Visit | Attending: Radiation Oncology | Admitting: Radiation Oncology

## 2018-05-26 ENCOUNTER — Other Ambulatory Visit: Payer: Self-pay

## 2018-05-26 DIAGNOSIS — Z510 Encounter for antineoplastic radiation therapy: Secondary | ICD-10-CM | POA: Diagnosis not present

## 2018-05-27 ENCOUNTER — Other Ambulatory Visit: Payer: Self-pay

## 2018-05-27 ENCOUNTER — Ambulatory Visit
Admission: RE | Admit: 2018-05-27 | Discharge: 2018-05-27 | Disposition: A | Discharge status: Home / Self Care | Payer: Medicaid Other | Source: Ambulatory Visit | Attending: Radiation Oncology | Admitting: Radiation Oncology

## 2018-05-27 DIAGNOSIS — Z510 Encounter for antineoplastic radiation therapy: Secondary | ICD-10-CM | POA: Diagnosis not present

## 2018-05-28 ENCOUNTER — Other Ambulatory Visit: Payer: Self-pay

## 2018-05-28 ENCOUNTER — Ambulatory Visit
Admission: RE | Admit: 2018-05-28 | Discharge: 2018-05-28 | Disposition: A | Discharge status: Home / Self Care | Payer: Medicaid Other | Source: Ambulatory Visit | Attending: Radiation Oncology | Admitting: Radiation Oncology

## 2018-05-28 DIAGNOSIS — Z510 Encounter for antineoplastic radiation therapy: Secondary | ICD-10-CM | POA: Diagnosis not present

## 2018-05-29 ENCOUNTER — Ambulatory Visit: Payer: Medicaid Other

## 2018-05-30 ENCOUNTER — Ambulatory Visit
Admission: RE | Admit: 2018-05-30 | Discharge: 2018-05-30 | Disposition: A | Discharge status: Home / Self Care | Payer: Medicaid Other | Source: Ambulatory Visit | Attending: Radiation Oncology | Admitting: Radiation Oncology

## 2018-05-30 ENCOUNTER — Ambulatory Visit: Payer: Medicaid Other

## 2018-05-30 ENCOUNTER — Other Ambulatory Visit: Payer: Self-pay

## 2018-05-30 DIAGNOSIS — Z510 Encounter for antineoplastic radiation therapy: Secondary | ICD-10-CM | POA: Diagnosis not present

## 2018-06-01 ENCOUNTER — Other Ambulatory Visit: Payer: Self-pay

## 2018-06-02 ENCOUNTER — Ambulatory Visit: Payer: Medicaid Other

## 2018-06-02 ENCOUNTER — Inpatient Hospital Stay: Payer: Medicaid Other

## 2018-06-02 ENCOUNTER — Other Ambulatory Visit: Payer: Self-pay

## 2018-06-02 ENCOUNTER — Ambulatory Visit
Admission: RE | Admit: 2018-06-02 | Discharge: 2018-06-02 | Disposition: A | Discharge status: Home / Self Care | Payer: Medicaid Other | Source: Ambulatory Visit | Attending: Radiation Oncology | Admitting: Radiation Oncology

## 2018-06-02 DIAGNOSIS — Z510 Encounter for antineoplastic radiation therapy: Secondary | ICD-10-CM | POA: Diagnosis not present

## 2018-06-02 DIAGNOSIS — C50911 Malignant neoplasm of unspecified site of right female breast: Secondary | ICD-10-CM | POA: Diagnosis not present

## 2018-06-02 DIAGNOSIS — C50919 Malignant neoplasm of unspecified site of unspecified female breast: Secondary | ICD-10-CM

## 2018-06-02 LAB — CBC
HCT: 42.6 % (ref 36.0–46.0)
Hemoglobin: 14.1 g/dL (ref 12.0–15.0)
MCH: 30.3 pg (ref 26.0–34.0)
MCHC: 33.1 g/dL (ref 30.0–36.0)
MCV: 91.6 fL (ref 80.0–100.0)
Platelets: 307 10*3/uL (ref 150–400)
RBC: 4.65 MIL/uL (ref 3.87–5.11)
RDW: 13.9 % (ref 11.5–15.5)
WBC: 9.8 10*3/uL (ref 4.0–10.5)
nRBC: 0 % (ref 0.0–0.2)

## 2018-06-03 ENCOUNTER — Ambulatory Visit: Payer: Medicaid Other

## 2018-06-03 ENCOUNTER — Ambulatory Visit
Admission: RE | Admit: 2018-06-03 | Discharge: 2018-06-03 | Disposition: A | Discharge status: Home / Self Care | Payer: Medicaid Other | Source: Ambulatory Visit | Attending: Radiation Oncology | Admitting: Radiation Oncology

## 2018-06-03 ENCOUNTER — Other Ambulatory Visit: Payer: Self-pay

## 2018-06-03 DIAGNOSIS — Z510 Encounter for antineoplastic radiation therapy: Secondary | ICD-10-CM | POA: Diagnosis not present

## 2018-06-04 ENCOUNTER — Other Ambulatory Visit: Payer: Self-pay

## 2018-06-04 ENCOUNTER — Ambulatory Visit
Admission: RE | Admit: 2018-06-04 | Discharge: 2018-06-04 | Disposition: A | Discharge status: Home / Self Care | Payer: Medicaid Other | Source: Ambulatory Visit | Attending: Radiation Oncology | Admitting: Radiation Oncology

## 2018-06-04 ENCOUNTER — Ambulatory Visit: Payer: Medicaid Other

## 2018-06-04 DIAGNOSIS — Z510 Encounter for antineoplastic radiation therapy: Secondary | ICD-10-CM | POA: Diagnosis not present

## 2018-06-05 ENCOUNTER — Ambulatory Visit: Payer: Medicaid Other

## 2018-06-05 ENCOUNTER — Other Ambulatory Visit: Payer: Self-pay

## 2018-06-05 ENCOUNTER — Ambulatory Visit
Admission: RE | Admit: 2018-06-05 | Discharge: 2018-06-05 | Disposition: A | Discharge status: Home / Self Care | Payer: Medicaid Other | Source: Ambulatory Visit | Attending: Radiation Oncology | Admitting: Radiation Oncology

## 2018-06-05 DIAGNOSIS — Z510 Encounter for antineoplastic radiation therapy: Secondary | ICD-10-CM | POA: Diagnosis not present

## 2018-06-06 ENCOUNTER — Ambulatory Visit: Payer: Medicaid Other

## 2018-06-06 ENCOUNTER — Ambulatory Visit
Admission: RE | Admit: 2018-06-06 | Discharge: 2018-06-06 | Disposition: A | Discharge status: Home / Self Care | Payer: Medicaid Other | Source: Ambulatory Visit | Attending: Radiation Oncology | Admitting: Radiation Oncology

## 2018-06-06 ENCOUNTER — Other Ambulatory Visit: Payer: Self-pay

## 2018-06-06 DIAGNOSIS — Z510 Encounter for antineoplastic radiation therapy: Secondary | ICD-10-CM | POA: Insufficient documentation

## 2018-06-06 DIAGNOSIS — Z170 Estrogen receptor positive status [ER+]: Secondary | ICD-10-CM | POA: Insufficient documentation

## 2018-06-06 DIAGNOSIS — C50911 Malignant neoplasm of unspecified site of right female breast: Secondary | ICD-10-CM | POA: Insufficient documentation

## 2018-06-09 ENCOUNTER — Other Ambulatory Visit: Payer: Self-pay

## 2018-06-09 ENCOUNTER — Other Ambulatory Visit: Payer: Self-pay | Admitting: *Deleted

## 2018-06-09 ENCOUNTER — Ambulatory Visit: Payer: Medicaid Other

## 2018-06-09 ENCOUNTER — Ambulatory Visit
Admission: RE | Admit: 2018-06-09 | Discharge: 2018-06-09 | Disposition: A | Discharge status: Home / Self Care | Payer: Medicaid Other | Source: Ambulatory Visit | Attending: Radiation Oncology | Admitting: Radiation Oncology

## 2018-06-09 DIAGNOSIS — Z510 Encounter for antineoplastic radiation therapy: Secondary | ICD-10-CM | POA: Diagnosis not present

## 2018-06-10 ENCOUNTER — Ambulatory Visit: Payer: Medicaid Other

## 2018-06-10 ENCOUNTER — Other Ambulatory Visit: Payer: Self-pay

## 2018-06-10 ENCOUNTER — Ambulatory Visit
Admission: RE | Admit: 2018-06-10 | Discharge: 2018-06-10 | Disposition: A | Discharge status: Home / Self Care | Payer: Medicaid Other | Source: Ambulatory Visit | Attending: Radiation Oncology | Admitting: Radiation Oncology

## 2018-06-10 DIAGNOSIS — Z510 Encounter for antineoplastic radiation therapy: Secondary | ICD-10-CM | POA: Diagnosis not present

## 2018-06-11 ENCOUNTER — Ambulatory Visit: Payer: Medicaid Other

## 2018-06-11 ENCOUNTER — Ambulatory Visit
Admission: RE | Admit: 2018-06-11 | Discharge: 2018-06-11 | Disposition: A | Discharge status: Home / Self Care | Payer: Medicaid Other | Source: Ambulatory Visit | Attending: Radiation Oncology | Admitting: Radiation Oncology

## 2018-06-11 ENCOUNTER — Other Ambulatory Visit: Payer: Self-pay

## 2018-06-11 DIAGNOSIS — Z510 Encounter for antineoplastic radiation therapy: Secondary | ICD-10-CM | POA: Diagnosis not present

## 2018-06-12 ENCOUNTER — Other Ambulatory Visit: Payer: Self-pay

## 2018-06-12 ENCOUNTER — Ambulatory Visit
Admission: RE | Admit: 2018-06-12 | Discharge: 2018-06-12 | Disposition: A | Discharge status: Home / Self Care | Payer: Medicaid Other | Source: Ambulatory Visit | Attending: Radiation Oncology | Admitting: Radiation Oncology

## 2018-06-12 ENCOUNTER — Ambulatory Visit: Payer: Medicaid Other

## 2018-06-12 ENCOUNTER — Inpatient Hospital Stay: Payer: Medicaid Other | Attending: Oncology

## 2018-06-12 ENCOUNTER — Encounter (INDEPENDENT_AMBULATORY_CARE_PROVIDER_SITE_OTHER): Payer: Self-pay

## 2018-06-12 DIAGNOSIS — Z510 Encounter for antineoplastic radiation therapy: Secondary | ICD-10-CM | POA: Diagnosis not present

## 2018-06-12 DIAGNOSIS — C50919 Malignant neoplasm of unspecified site of unspecified female breast: Secondary | ICD-10-CM | POA: Diagnosis present

## 2018-06-12 LAB — CBC
HCT: 41 % (ref 36.0–46.0)
Hemoglobin: 13.6 g/dL (ref 12.0–15.0)
MCH: 30.4 pg (ref 26.0–34.0)
MCHC: 33.2 g/dL (ref 30.0–36.0)
MCV: 91.7 fL (ref 80.0–100.0)
Platelets: 268 10*3/uL (ref 150–400)
RBC: 4.47 MIL/uL (ref 3.87–5.11)
RDW: 13.8 % (ref 11.5–15.5)
WBC: 7.7 10*3/uL (ref 4.0–10.5)
nRBC: 0 % (ref 0.0–0.2)

## 2018-06-13 ENCOUNTER — Other Ambulatory Visit: Payer: Self-pay

## 2018-06-13 ENCOUNTER — Inpatient Hospital Stay (HOSPITAL_BASED_OUTPATIENT_CLINIC_OR_DEPARTMENT_OTHER): Payer: Medicaid Other | Admitting: Oncology

## 2018-06-13 ENCOUNTER — Encounter: Payer: Self-pay | Admitting: Oncology

## 2018-06-13 ENCOUNTER — Ambulatory Visit
Admission: RE | Admit: 2018-06-13 | Discharge: 2018-06-13 | Disposition: A | Discharge status: Home / Self Care | Payer: Medicaid Other | Source: Ambulatory Visit | Attending: Radiation Oncology | Admitting: Radiation Oncology

## 2018-06-13 DIAGNOSIS — Z510 Encounter for antineoplastic radiation therapy: Secondary | ICD-10-CM | POA: Diagnosis not present

## 2018-06-13 DIAGNOSIS — Z7189 Other specified counseling: Secondary | ICD-10-CM | POA: Diagnosis not present

## 2018-06-13 DIAGNOSIS — C50919 Malignant neoplasm of unspecified site of unspecified female breast: Secondary | ICD-10-CM

## 2018-06-13 DIAGNOSIS — C50912 Malignant neoplasm of unspecified site of left female breast: Secondary | ICD-10-CM | POA: Diagnosis not present

## 2018-06-13 MED ORDER — ANASTROZOLE 1 MG PO TABS: 1.0000 mg | ORAL_TABLET | Freq: Every day | ORAL | 3 refills | Status: DC

## 2018-06-13 NOTE — Progress Notes (Signed)
Patient almost finished with radiation- she is having boost tx now. She will be finished at end of next week. She does have some skin issues from the radiation to breast and she is using aquaphor and another cream on skin and it is feeling better.

## 2018-06-15 NOTE — Progress Notes (Signed)
I connected with Margaret Blackburn on 06/15/18 at 11:30 AM EDT by video enabled telemedicine visit and verified that I am speaking with the correct person using two identifiers.   I discussed the limitations, risks, security and privacy concerns of performing an evaluation and management service by telemedicine and the availability of in-person appointments. I also discussed with the patient that there may be a patient responsible charge related to this service. The patient expressed understanding and agreed to proceed.  Other persons participating in the visit and their role in the encounter:  none  Patient's location: home Provider's location:  home  Chief Complaint:  Discuss hormone therapy for breast cancer  History of present illness: patient is a 53 year old female with no prior history of breast cancer.  She recently underwent a screening mammogram in November 2019 which showed focal architectural distortion in the upper outer portion of the right breast.  Ultrasound of the right axilla was negative for adenopathy.  Core biopsy showed atypical small glandular proliferation suspicious for tubular carcinoma.  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  7 mm, grade 1 ER greater than 90% positive, PR 51 to 90% positive and HER-2/neu negative. 5 sentinel LN negative for malignancy  Patient is G2, P2 L2.  Has a prior history of hysterectomy but ovaries are still in situ.  Did not use HRT.  Used hormonal contraception roughly for 4 years remotely.  Family history significant for breast cancer in her mother in her 40s, maternal grandmother.  Mother also was diagnosed recently with lung cancer.  Patient currently smokes daily about 1 pack of cigarettes per day and has been doing so for over 30 years.   Interval history she will be finishing radiation this week.    Interval history: left  breast feels sore due to ongoing radiation. She is putting aquaphore cream and it has been helping her  Review of Systems  Constitutional: Negative for chills, fever, malaise/fatigue and weight loss.  HENT: Negative for congestion, ear discharge and nosebleeds.   Eyes: Negative for blurred vision.  Respiratory: Negative for cough, hemoptysis, sputum production, shortness of breath and wheezing.   Cardiovascular: Negative for chest pain, palpitations, orthopnea and claudication.  Gastrointestinal: Negative for abdominal pain, blood in stool, constipation, diarrhea, heartburn, melena, nausea and vomiting.  Genitourinary: Negative for dysuria, flank pain, frequency, hematuria and urgency.  Musculoskeletal: Negative for back pain, joint pain and myalgias.  Skin: Negative for rash.  Neurological: Negative for dizziness, tingling, focal weakness, seizures, weakness and headaches.  Endo/Heme/Allergies: Does not bruise/bleed easily.  Psychiatric/Behavioral: Negative for depression and suicidal ideas. The patient does not have insomnia.     Allergies  Allergen Reactions  . Penicillins Rash and Other (See Comments)    DID THE REACTION INVOLVE: Swelling of the face/tongue/throat, SOB, or low BP? No Sudden or severe rash/hives, skin peeling, or the inside of the mouth or nose? Yes, blisters. Not immediate. Did it require medical treatment? Yes When did it last happen? 40s If all above answers are "NO", may proceed with cephalosporin use.     Past Medical History:  Diagnosis Date  . Allergy   . Arthritis   . COPD (chronic obstructive pulmonary disease) ([place])   . Depression   . Sleep apnea     Past Surgical History:  Procedure Laterality Date  . ABDOMINAL HYSTERECTOMY    . BACK SURGERY    . BREAST  BIOPSY Right 12/25/2017   affirm bx , x clip,TUBULAR  CARCINOMA  . 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 BREAST LUMPECTOMY WITH NEEDLE LOCALIZATION Right 03/03/2018    Procedure: BREAST LUMPECTOMY WITH NEEDLE LOCALIZATION;  Surgeon: Vickie Epley, MD;  Location: [facility] ORS;  Service: General;  Laterality: Right;  . SENTINEL NODE BIOPSY Right 03/21/2018   Procedure: RIGHT SENTINEL LYMPH  NODE BIOPSY;  Surgeon: Vickie Epley, MD;  Location: [facility] ORS;  Service: General;  Laterality: Right;    Social History   Socioeconomic History  . Marital status: Single    Spouse name: Not on file  . Number of children: 2  . Years of education: Not on file  . Highest education level: Not on file  Occupational History  . Not on file  Social Needs  . Financial resource strain: Not on file  . Food insecurity:    Worry: Not on file    Inability: Not on file  . Transportation needs:    Medical: Not on file    Non-medical: Not on file  Tobacco Use  . Smoking status: Current Every Day Smoker    Packs/day: 1.00    Years: 30.00    Pack years: 30.00    Types: Cigarettes  . Smokeless tobacco: Never Used  Substance and Sexual Activity  . Alcohol use: No  . Drug use: Not Currently    Types: Marijuana    Comment: past  . Sexual activity: Not Currently  Lifestyle  . Physical activity:    Days per week: Not on file    Minutes per session: Not on file  . Stress: Not on file  Relationships  . Social connections:    Talks on phone: Not on file    Gets together: Not on file    Attends religious service: Not on file    Active member of club or organization: Not on file    Attends meetings of clubs or organizations: Not on file    Relationship status: Not on file  . Intimate partner violence:    Fear of current or ex partner: Not on file    Emotionally abused: Not on file    Physically abused: Not on file    Forced sexual activity: Not on file  Other Topics Concern  . Not on file  Social History Narrative  . Not on file    Family History  Problem Relation Age of Onset  . Breast cancer Mother 4  . Lung cancer Mother   . Breast cancer Maternal  Grandmother   . Thyroid cancer Father      Current Outpatient Medications:  .  albuterol (PROVENTIL HFA;VENTOLIN HFA) 108 (90 Base) MCG/ACT inhaler, Inhale 2 puffs into the lungs every 6 (six) hours as needed for wheezing or shortness of breath., Disp: 1 Inhaler, Rfl: 0 .  sertraline (ZOLOFT) 50 MG tablet, Take 1 tablet (50 mg total) by mouth daily., Disp: 90 tablet, Rfl: 1 .  anastrozole (ARIMIDEX) 1 MG tablet, Take 1 tablet (1 mg total) by mouth daily., Disp: 30 tablet, Rfl: 3 .  ibuprofen (ADVIL,MOTRIN) 200 MG tablet, Take 800 mg by mouth every 6 (six) hours as needed for headache or moderate pain., Disp: , Rfl:   No results found.  No images are attached to the encounter.   CMP Latest Ref Rng & Units 03/20/2018  Glucose 70 - 99 mg/dL 129(H)  BUN 6 - 20 mg/dL 13  Creatinine 0.44 - 1.00 mg/dL  0.50  Sodium 135 - 145 mmol/L 136  Potassium 3.5 - 5.1 mmol/L 4.1  Chloride 98 - 111 mmol/L 105  CO2 22 - 32 mmol/L 25  Calcium 8.9 - 10.3 mg/dL 9.2  Total Protein 6.5 - 8.1 g/dL 7.8  Total Bilirubin 0.3 - 1.2 mg/dL 0.6  Alkaline Phos 38 - 126 U/L 68  AST 15 - 41 U/L 15  ALT 0 - 44 U/L 22   CBC Latest Ref Rng & Units 06/12/2018  WBC 4.0 - 10.5 K/uL 7.7  Hemoglobin 12.0 - 15.0 g/dL 13.6  Hematocrit 36.0 - 46.0 % 41.0  Platelets 150 - 400 K/uL 268     Observation/objective:patient appears in no acute distress over video visit today. Breathing is no labored  Assessment and plan: Patient is a 53 y.o. 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 She is currently undergoing radiation treatment. This is a visit to discuss hormone therapy for breast cancer  With grade 1 tumor <10 mm, she did not meet TAILORx criteria for adjuvant chemotherapy. She will finish radiation treatment this week  Given that her tumor was ER PR positive hormone therapy is indicated at this time.   Idiscussed the role for hormone therapy. Given that she is postmenopausal based on her hormone levels checked at previous visit,  I would favor 5 years of adjuvant hormone therapy with aromatase inhibitor.   I discussed the risks and benefits of Arimidex including all but not limited to fatigue, hypercholesterolemia, hot flashes, arthralgias and worsening bone health.  Patient will also need to be on calcium 1200 mg along with vitamin D 800 international units.  We will obtain a baseline bone density scan. I would like her to finish radiation therapy and start hormone therapy thereafter. Patient verbalized understanding and agrees to proceed. Treatment being given with a curative intent. arimidex has been prescribed to her pharmacy    Follow-up instructions: f/u in 6 weeks. cmp and bone density scan prior  I discussed the assessment and treatment plan with the patient. The patient was provided an opportunity to ask questions and all were answered. The patient agreed with the plan and demonstrated an understanding of the instructions.   The patient was advised to call back or seek an in-person evaluation if the symptoms worsen or if the condition fails to improve as anticipated.   Visit Diagnosis: 1. Invasive carcinoma of breast ([place])   2. Goals of care, counseling/discussion   3. 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 Randa Evens, MD, MPH [facility] at [facility] [phone] 06/15/2018 5:41 PM

## 2018-06-16 ENCOUNTER — Ambulatory Visit
Admission: RE | Admit: 2018-06-16 | Discharge: 2018-06-16 | Disposition: A | Discharge status: Home / Self Care | Payer: Medicaid Other | Source: Ambulatory Visit | Attending: Radiation Oncology | Admitting: Radiation Oncology

## 2018-06-16 ENCOUNTER — Other Ambulatory Visit: Payer: Self-pay

## 2018-06-16 DIAGNOSIS — Z510 Encounter for antineoplastic radiation therapy: Secondary | ICD-10-CM | POA: Diagnosis not present

## 2018-06-17 ENCOUNTER — Ambulatory Visit: Payer: Medicaid Other

## 2018-06-17 ENCOUNTER — Ambulatory Visit
Admission: RE | Admit: 2018-06-17 | Discharge: 2018-06-17 | Disposition: A | Discharge status: Home / Self Care | Payer: Medicaid Other | Source: Ambulatory Visit | Attending: Radiation Oncology | Admitting: Radiation Oncology

## 2018-06-17 ENCOUNTER — Other Ambulatory Visit: Payer: Self-pay

## 2018-06-17 DIAGNOSIS — Z510 Encounter for antineoplastic radiation therapy: Secondary | ICD-10-CM | POA: Diagnosis not present

## 2018-06-18 ENCOUNTER — Other Ambulatory Visit: Payer: Self-pay

## 2018-06-18 ENCOUNTER — Ambulatory Visit
Admission: RE | Admit: 2018-06-18 | Discharge: 2018-06-18 | Disposition: A | Discharge status: Home / Self Care | Payer: Medicaid Other | Source: Ambulatory Visit | Attending: Radiation Oncology | Admitting: Radiation Oncology

## 2018-06-18 DIAGNOSIS — Z510 Encounter for antineoplastic radiation therapy: Secondary | ICD-10-CM | POA: Diagnosis not present

## 2018-06-19 ENCOUNTER — Ambulatory Visit
Admission: RE | Admit: 2018-06-19 | Discharge: 2018-06-19 | Disposition: A | Discharge status: Home / Self Care | Payer: Medicaid Other | Source: Ambulatory Visit | Attending: Radiation Oncology | Admitting: Radiation Oncology

## 2018-06-19 ENCOUNTER — Other Ambulatory Visit: Payer: Self-pay

## 2018-06-19 ENCOUNTER — Ambulatory Visit: Payer: Self-pay | Admitting: Oncology

## 2018-06-19 ENCOUNTER — Ambulatory Visit: Payer: Medicaid Other

## 2018-06-19 DIAGNOSIS — Z510 Encounter for antineoplastic radiation therapy: Secondary | ICD-10-CM | POA: Diagnosis not present

## 2018-06-20 ENCOUNTER — Ambulatory Visit
Admission: RE | Admit: 2018-06-20 | Discharge: 2018-06-20 | Disposition: A | Discharge status: Home / Self Care | Payer: Medicaid Other | Source: Ambulatory Visit | Attending: Radiation Oncology | Admitting: Radiation Oncology

## 2018-06-20 ENCOUNTER — Other Ambulatory Visit: Payer: Self-pay

## 2018-06-20 DIAGNOSIS — Z510 Encounter for antineoplastic radiation therapy: Secondary | ICD-10-CM | POA: Diagnosis not present

## 2018-07-14 ENCOUNTER — Ambulatory Visit: Payer: Medicaid Other | Admitting: Adult Health

## 2018-07-14 ENCOUNTER — Other Ambulatory Visit: Payer: Self-pay

## 2018-07-24 ENCOUNTER — Other Ambulatory Visit: Payer: Self-pay

## 2018-07-25 ENCOUNTER — Telehealth: Payer: Self-pay | Admitting: *Deleted

## 2018-07-25 ENCOUNTER — Ambulatory Visit
Admission: RE | Admit: 2018-07-25 | Discharge: 2018-07-25 | Disposition: A | Discharge status: Home / Self Care | Payer: Medicaid Other | Source: Ambulatory Visit | Attending: Radiation Oncology | Admitting: Radiation Oncology

## 2018-07-25 ENCOUNTER — Encounter: Payer: Self-pay | Admitting: Oncology

## 2018-07-25 ENCOUNTER — Inpatient Hospital Stay: Payer: Medicaid Other

## 2018-07-25 ENCOUNTER — Encounter: Payer: Self-pay | Admitting: Radiation Oncology

## 2018-07-25 ENCOUNTER — Other Ambulatory Visit: Payer: Self-pay

## 2018-07-25 ENCOUNTER — Inpatient Hospital Stay: Payer: Medicaid Other | Attending: Oncology | Admitting: Oncology

## 2018-07-25 VITALS — BP 131/92 | HR 91 | Temp 98.9°F | Resp 16 | Wt 211.1 lb

## 2018-07-25 VITALS — BP 131/92 | HR 91 | Temp 98.9°F | Resp 20 | Wt 211.1 lb

## 2018-07-25 DIAGNOSIS — F17210 Nicotine dependence, cigarettes, uncomplicated: Secondary | ICD-10-CM | POA: Diagnosis not present

## 2018-07-25 DIAGNOSIS — C50411 Malignant neoplasm of upper-outer quadrant of right female breast: Secondary | ICD-10-CM | POA: Diagnosis present

## 2018-07-25 DIAGNOSIS — C50911 Malignant neoplasm of unspecified site of right female breast: Secondary | ICD-10-CM | POA: Diagnosis present

## 2018-07-25 DIAGNOSIS — Z808 Family history of malignant neoplasm of other organs or systems: Secondary | ICD-10-CM

## 2018-07-25 DIAGNOSIS — Z803 Family history of malignant neoplasm of breast: Secondary | ICD-10-CM

## 2018-07-25 DIAGNOSIS — Z170 Estrogen receptor positive status [ER+]: Secondary | ICD-10-CM

## 2018-07-25 DIAGNOSIS — Z801 Family history of malignant neoplasm of trachea, bronchus and lung: Secondary | ICD-10-CM

## 2018-07-25 DIAGNOSIS — Z79811 Long term (current) use of aromatase inhibitors: Secondary | ICD-10-CM | POA: Insufficient documentation

## 2018-07-25 DIAGNOSIS — C50919 Malignant neoplasm of unspecified site of unspecified female breast: Secondary | ICD-10-CM

## 2018-07-25 DIAGNOSIS — Z08 Encounter for follow-up examination after completed treatment for malignant neoplasm: Secondary | ICD-10-CM

## 2018-07-25 DIAGNOSIS — Z923 Personal history of irradiation: Secondary | ICD-10-CM | POA: Diagnosis not present

## 2018-07-25 DIAGNOSIS — Z5181 Encounter for therapeutic drug level monitoring: Secondary | ICD-10-CM

## 2018-07-25 LAB — COMPREHENSIVE METABOLIC PANEL
ALT: 20 U/L (ref 0–44)
AST: 17 U/L (ref 15–41)
Albumin: 4 g/dL (ref 3.5–5.0)
Alkaline Phosphatase: 61 U/L (ref 38–126)
Anion gap: 8 (ref 5–15)
BUN: 11 mg/dL (ref 6–20)
CO2: 29 mmol/L (ref 22–32)
Calcium: 8.7 mg/dL — ABNORMAL LOW (ref 8.9–10.3)
Chloride: 103 mmol/L (ref 98–111)
Creatinine, Ser: 0.69 mg/dL (ref 0.44–1.00)
GFR calc Af Amer: >60 mL/min (ref >60)
GFR calc non Af Amer: >60 mL/min (ref >60)
Glucose, Bld: 120 mg/dL — ABNORMAL HIGH (ref 70–99)
Potassium: 4.3 mmol/L (ref 3.5–5.1)
Sodium: 140 mmol/L (ref 135–145)
Total Bilirubin: 0.3 mg/dL (ref 0.3–1.2)
Total Protein: 7.2 g/dL (ref 6.5–8.1)

## 2018-07-25 NOTE — Telephone Encounter (Signed)
Called pt. And left message that I got the bone density r/s from when it was cancelled in beginning of pandemic.  It is scheduled for 6/26 1:20 at [facility] breast [facility]. Arrive 10 min. Early. If it does not work then she can call me or call mammography [phone].

## 2018-07-25 NOTE — Progress Notes (Signed)
Pt doing good , arimidex without any problems taking it. Breast exam by chrystal just now

## 2018-07-25 NOTE — Progress Notes (Signed)
Radiation Oncology Follow up Note  Name: Margaret Blackburn   Date:   07/25/2018 MRN:  [id number] DOB: 1965-11-19    This 53 y.o. female presents to the clinic today for 1 month follow-up status post whole breast radiation to her right breast for stage I ER PR positive invasive mammary carcinoma.  REFERRING PROVIDER: Kendell Bane, NP  HPI: Patient is a 53 year old female now at 1 month having completed whole breast radiation to her right breast for stage I ER PR positive invasive mammary carcinoma seen today in routine follow-up she is doing well.  She specifically denies breast tenderness cough or bone pain..  She is currently on arimadex tolerating that well without side effect.  COMPLICATIONS OF TREATMENT: none  FOLLOW UP COMPLIANCE: keeps appointments   PHYSICAL EXAM:  BP (!) 131/92 (BP Location: Left Arm, Patient Position: Sitting)   Pulse 91   Temp 98.9 F (37.2 C)   Resp 20   Wt 211 lb 1.5 oz (95.8 kg)   BMI 37.39 kg/m  Lungs are clear to A&P cardiac examination essentially unremarkable with regular rate and rhythm. No dominant mass or nodularity is noted in either breast in 2 positions examined. Incision is well-healed. No axillary or supraclavicular adenopathy is appreciated. Cosmetic result is excellent.  Well-developed well-nourished patient in NAD. HEENT reveals PERLA, EOMI, discs not visualized.  Oral cavity is clear. No oral mucosal lesions are identified. Neck is clear without evidence of cervical or supraclavicular adenopathy. Lungs are clear to A&P. Cardiac examination is essentially unremarkable with regular rate and rhythm without murmur rub or thrill. Abdomen is benign with no organomegaly or masses noted. Motor sensory and DTR levels are equal and symmetric in the upper and lower extremities. Cranial nerves II through XII are grossly intact. Proprioception is intact. No peripheral adenopathy or edema is identified. No motor or sensory levels are noted. Crude visual  fields are within normal range.  RADIOLOGY RESULTS: No current films for review  PLAN: Present time patient is doing well 1 month out from whole breast radiation I am pleased with her overall progress.  She continues on arimadex without side effect.  I have asked to see her back in 4 to 5 months for follow-up.  Patient knows to call at anytime with any concerns.  I would like to take this opportunity to thank you for allowing me to participate in the care of your patient.Noreene Filbert, MD

## 2018-07-27 NOTE — Progress Notes (Signed)
Hematology/Oncology Consult note [facility]  [phone] [phone]  Patient Care Team: Kendell Bane, NP as PCP - General (Adult Health Nurse Practitioner)   Name of the patient: Margaret Blackburn  [id number]  [birth year]   Date of visit: 07/27/18  Diagnosis-stage I ER positive right breast cancer on Arimidex  Chief complaint/ Reason for visit-routine follow-up of right breast cancer  Heme/Onc history: patient is a 53 year old female with no prior history of breast cancer. She recently underwent a screening mammogram in November 2019 which showed focal architectural distortion in the upper outer portion of the right breast. Ultrasound of the right axilla was negative for adenopathy. Core biopsy showed atypical small glandular proliferation suspicious for tubular carcinoma. 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 7 mm, grade 1 ER greater than 90% positive, PR 51 to 90% positive and HER-2/neu negative. 5 sentinel LN negative for malignancy  Patient is status post hysterectomy but ovaries still in situ.  Hormone levels were consistent with postmenopausal status.  She completed adjuvant radiation and was started on Arimidex in May 2020  Interval history-currently patient is tolerating Arimidex well without any significant side effects.  Reports no fatigue, hot flashes, joint pains or other complaints.  ECOG PS- 0 Pain scale- 0   Review of systems- Review of Systems  Constitutional: Negative for chills, fever, malaise/fatigue and weight loss.  HENT: Negative for congestion, ear discharge and nosebleeds.   Eyes: Negative for blurred vision.  Respiratory: Negative for cough, hemoptysis, sputum production, shortness of breath and wheezing.   Cardiovascular: Negative for chest pain, palpitations, orthopnea and claudication.   Gastrointestinal: Negative for abdominal pain, blood in stool, constipation, diarrhea, heartburn, melena, nausea and vomiting.  Genitourinary: Negative for dysuria, flank pain, frequency, hematuria and urgency.  Musculoskeletal: Negative for back pain, joint pain and myalgias.  Skin: Negative for rash.  Neurological: Negative for dizziness, tingling, focal weakness, seizures, weakness and headaches.  Endo/Heme/Allergies: Does not bruise/bleed easily.  Psychiatric/Behavioral: Negative for depression and suicidal ideas. The patient does not have insomnia.       Allergies  Allergen Reactions  . Penicillins Rash and Other (See Comments)    DID THE REACTION INVOLVE: Swelling of the face/tongue/throat, SOB, or low BP? No Sudden or severe rash/hives, skin peeling, or the inside of the mouth or nose? Yes, blisters. Not immediate. Did it require medical treatment? Yes When did it last happen? 40s If all above answers are "NO", may proceed with cephalosporin use.      Past Medical History:  Diagnosis Date  . Allergy   . Arthritis   . COPD (chronic obstructive pulmonary disease) ([place])   . Depression   . Sleep apnea      Past Surgical History:  Procedure Laterality Date  . ABDOMINAL HYSTERECTOMY    . BACK SURGERY    . BREAST BIOPSY Right 12/25/2017   affirm bx , x clip,TUBULAR  CARCINOMA  . 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 BREAST LUMPECTOMY WITH NEEDLE LOCALIZATION Right 03/03/2018   Procedure: BREAST LUMPECTOMY WITH NEEDLE LOCALIZATION;  Surgeon: Vickie Epley, MD;  Location: [facility] ORS;  Service: General;  Laterality: Right;  . SENTINEL NODE BIOPSY Right 03/21/2018   Procedure: RIGHT SENTINEL LYMPH  NODE BIOPSY;  Surgeon: Vickie Epley, MD;  Location: [facility] ORS;  Service: General;  Laterality: Right;    Social History  Socioeconomic History  . Marital status: Single    Spouse name: Not on file  . Number of children: 2  . Years of education: Not  on file  . Highest education level: Not on file  Occupational History  . Not on file  Social Needs  . Financial resource strain: Not on file  . Food insecurity    Worry: Not on file    Inability: Not on file  . Transportation needs    Medical: Not on file    Non-medical: Not on file  Tobacco Use  . Smoking status: Current Every Day Smoker    Packs/day: 1.00    Years: 30.00    Pack years: 30.00    Types: Cigarettes  . Smokeless tobacco: Never Used  Substance and Sexual Activity  . Alcohol use: No  . Drug use: Not Currently    Types: Marijuana    Comment: past  . Sexual activity: Not Currently  Lifestyle  . Physical activity    Days per week: Not on file    Minutes per session: Not on file  . Stress: Not on file  Relationships  . Social Herbalist on phone: Not on file    Gets together: Not on file    Attends religious service: Not on file    Active member of club or organization: Not on file    Attends meetings of clubs or organizations: Not on file    Relationship status: Not on file  . Intimate partner violence    Fear of current or ex partner: Not on file    Emotionally abused: Not on file    Physically abused: Not on file    Forced sexual activity: Not on file  Other Topics Concern  . Not on file  Social History Narrative  . Not on file    Family History  Problem Relation Age of Onset  . Breast cancer Mother 13  . Lung cancer Mother   . Breast cancer Maternal Grandmother   . Thyroid cancer Father      Current Outpatient Medications:  .  acetaminophen (TYLENOL) 500 MG tablet, Take 500 mg by mouth every 6 (six) hours as needed., Disp: , Rfl:  .  albuterol (PROVENTIL HFA;VENTOLIN HFA) 108 (90 Base) MCG/ACT inhaler, Inhale 2 puffs into the lungs every 6 (six) hours as needed for wheezing or shortness of breath., Disp: 1 Inhaler, Rfl: 0 .  anastrozole (ARIMIDEX) 1 MG tablet, Take 1 tablet (1 mg total) by mouth daily., Disp: 30 tablet, Rfl: 3 .   sertraline (ZOLOFT) 50 MG tablet, Take 1 tablet (50 mg total) by mouth daily., Disp: 90 tablet, Rfl: 1  Physical exam:  Vitals:   07/25/18 1142  BP: (!) 131/92  Pulse: 91  Resp: 16  Temp: 98.9 F (37.2 C)  TempSrc: Tympanic  Weight: 211 lb 1.5 oz (95.8 kg)   Physical Exam Constitutional:      General: She is not in acute distress. HENT:     Head: Normocephalic and atraumatic.  Eyes:     Pupils: Pupils are equal, round, and reactive to light.  Neck:     Musculoskeletal: Normal range of motion.  Cardiovascular:     Rate and Rhythm: Normal rate and regular rhythm.     Heart sounds: Normal heart sounds.  Pulmonary:     Effort: Pulmonary effort is normal.     Breath sounds: Normal breath sounds.  Abdominal:     General: Bowel sounds  are normal.     Palpations: Abdomen is soft.  Skin:    General: Skin is warm and dry.  Neurological:     Mental Status: She is alert and oriented to person, place, and time.      CMP Latest Ref Rng & Units 07/25/2018  Glucose 70 - 99 mg/dL 120(H)  BUN 6 - 20 mg/dL 11  Creatinine 0.44 - 1.00 mg/dL 0.69  Sodium 135 - 145 mmol/L 140  Potassium 3.5 - 5.1 mmol/L 4.3  Chloride 98 - 111 mmol/L 103  CO2 22 - 32 mmol/L 29  Calcium 8.9 - 10.3 mg/dL 8.7(L)  Total Protein 6.5 - 8.1 g/dL 7.2  Total Bilirubin 0.3 - 1.2 mg/dL 0.3  Alkaline Phos 38 - 126 U/L 61  AST 15 - 41 U/L 17  ALT 0 - 44 U/L 20   CBC Latest Ref Rng & Units 06/12/2018  WBC 4.0 - 10.5 K/uL 7.7  Hemoglobin 12.0 - 15.0 g/dL 13.6  Hematocrit 36.0 - 46.0 % 41.0  Platelets 150 - 400 K/uL 268     Assessment and plan- Patient is a 53 y.o. 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  She has completed adjuvant radiation and currently on Arimidex  Overall patient is tolerating her Arimidex well and reports no significant side effects.  She will also continue calcium and  vitamin D and continue hormone therapy for 5 years.  I will see her back in 3 months no labs.  She will be also getting a bone density scan later this month which will serve as a baseline   Visit Diagnosis 1. Visit for monitoring Arimidex therapy   2. Encounter for follow-up surveillance of breast cancer      Dr. Randa Evens, MD, MPH [facility] at [facility] [phone] 07/27/2018 6:50 PM

## 2018-07-28 ENCOUNTER — Other Ambulatory Visit: Payer: Medicaid Other

## 2018-08-01 ENCOUNTER — Other Ambulatory Visit: Payer: Self-pay

## 2018-08-01 ENCOUNTER — Ambulatory Visit
Admission: RE | Admit: 2018-08-01 | Discharge: 2018-08-01 | Disposition: A | Discharge status: Home / Self Care | Payer: Medicaid Other | Source: Ambulatory Visit | Attending: Oncology | Admitting: Oncology

## 2018-08-01 DIAGNOSIS — C50919 Malignant neoplasm of unspecified site of unspecified female breast: Secondary | ICD-10-CM | POA: Diagnosis not present

## 2018-08-01 HISTORY — DX: Personal history of irradiation: Z92.3

## 2018-09-30 ENCOUNTER — Other Ambulatory Visit: Payer: Self-pay

## 2018-09-30 DIAGNOSIS — C50911 Malignant neoplasm of unspecified site of right female breast: Secondary | ICD-10-CM

## 2018-10-21 ENCOUNTER — Other Ambulatory Visit: Payer: Self-pay | Admitting: Oncology

## 2018-10-24 ENCOUNTER — Inpatient Hospital Stay: Payer: Medicaid Other | Admitting: Oncology

## 2018-10-28 ENCOUNTER — Other Ambulatory Visit: Payer: Self-pay

## 2018-10-28 ENCOUNTER — Inpatient Hospital Stay: Payer: Medicaid Other | Attending: Oncology | Admitting: Oncology

## 2018-10-28 ENCOUNTER — Encounter: Payer: Self-pay | Admitting: Oncology

## 2018-10-28 VITALS — BP 108/64 | HR 81 | Temp 97.6°F | Resp 16 | Ht 63.0 in | Wt 203.5 lb

## 2018-10-28 DIAGNOSIS — Z79811 Long term (current) use of aromatase inhibitors: Secondary | ICD-10-CM | POA: Insufficient documentation

## 2018-10-28 DIAGNOSIS — Z90710 Acquired absence of both cervix and uterus: Secondary | ICD-10-CM | POA: Diagnosis not present

## 2018-10-28 DIAGNOSIS — Z923 Personal history of irradiation: Secondary | ICD-10-CM | POA: Insufficient documentation

## 2018-10-28 DIAGNOSIS — Z803 Family history of malignant neoplasm of breast: Secondary | ICD-10-CM | POA: Diagnosis not present

## 2018-10-28 DIAGNOSIS — M85852 Other specified disorders of bone density and structure, left thigh: Secondary | ICD-10-CM

## 2018-10-28 DIAGNOSIS — Z801 Family history of malignant neoplasm of trachea, bronchus and lung: Secondary | ICD-10-CM | POA: Insufficient documentation

## 2018-10-28 DIAGNOSIS — M8580 Other specified disorders of bone density and structure, unspecified site: Secondary | ICD-10-CM | POA: Diagnosis not present

## 2018-10-28 DIAGNOSIS — Z170 Estrogen receptor positive status [ER+]: Secondary | ICD-10-CM | POA: Diagnosis not present

## 2018-10-28 DIAGNOSIS — C50911 Malignant neoplasm of unspecified site of right female breast: Secondary | ICD-10-CM | POA: Diagnosis present

## 2018-10-28 DIAGNOSIS — F17210 Nicotine dependence, cigarettes, uncomplicated: Secondary | ICD-10-CM | POA: Insufficient documentation

## 2018-10-28 DIAGNOSIS — Z5181 Encounter for therapeutic drug level monitoring: Secondary | ICD-10-CM

## 2018-10-28 DIAGNOSIS — Z808 Family history of malignant neoplasm of other organs or systems: Secondary | ICD-10-CM | POA: Diagnosis not present

## 2018-10-28 NOTE — Progress Notes (Signed)
Hematology/Oncology Consult note [facility]  Telephone:([phone] [phone]  Patient Care Team: Kendell Bane, NP as PCP - General (Adult Health Nurse Practitioner)   Name of the patient: Margaret Blackburn  [id number]  12/29/1965   Date of visit: 10/28/18  Diagnosis- stage I ER positive right breast cancer on Arimidex   Chief complaint/ Reason for visit-routine follow-up of breast cancer on Arimidex  Heme/Onc history: patient is a 53 year old female with no prior history of breast cancer. She recently underwent a screening mammogram in November 2019 which showed focal architectural distortion in the upper outer portion of the right breast. Ultrasound of the right axilla was negative for adenopathy. Core biopsy showed atypical small glandular proliferation suspicious for tubular carcinoma. 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 7 mm, grade 1 ER greater than 90% positive, PR 51 to 90% positive and HER-2/neu negative.5 sentinel LN negative for malignancy  Patient is status post hysterectomy but ovaries still in situ.  Hormone levels were consistent with postmenopausal status.  She completed adjuvant radiation and was started on Arimidex in May 2020   Interval history-she is tolerating Arimidex well and denies any complaints at this time.  Denies any joint pains hot flashes aches or pains anywhere  ECOG PS- 0 Pain scale- 0   Review of systems- Review of Systems  Constitutional: Negative for chills, fever, malaise/fatigue and weight loss.  HENT: Negative for congestion, ear discharge and nosebleeds.   Eyes: Negative for blurred vision.  Respiratory: Negative for cough, hemoptysis, sputum production, shortness of breath and wheezing.   Cardiovascular: Negative for chest pain, palpitations, orthopnea and claudication.   Gastrointestinal: Negative for abdominal pain, blood in stool, constipation, diarrhea, heartburn, melena, nausea and vomiting.  Genitourinary: Negative for dysuria, flank pain, frequency, hematuria and urgency.  Musculoskeletal: Negative for back pain, joint pain and myalgias.  Skin: Negative for rash.  Neurological: Negative for dizziness, tingling, focal weakness, seizures, weakness and headaches.  Endo/Heme/Allergies: Does not bruise/bleed easily.  Psychiatric/Behavioral: Negative for depression and suicidal ideas. The patient does not have insomnia.       Allergies  Allergen Reactions  . Penicillins Rash and Other (See Comments)    DID THE REACTION INVOLVE: Swelling of the face/tongue/throat, SOB, or low BP? No Sudden or severe rash/hives, skin peeling, or the inside of the mouth or nose? Yes, blisters. Not immediate. Did it require medical treatment? Yes When did it last happen? 40s If all above answers are "NO", may proceed with cephalosporin use.      Past Medical History:  Diagnosis Date  . Allergy   . Arthritis   . Breast cancer ([place]) 12/2017   right breast  . COPD (chronic obstructive pulmonary disease) ([place])   . Depression   . Personal history of radiation therapy   . Sleep apnea      Past Surgical History:  Procedure Laterality Date  . ABDOMINAL HYSTERECTOMY    . BACK SURGERY    . BREAST BIOPSY Right 12/25/2017   affirm bx , x clip,TUBULAR  CARCINOMA  . 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 BREAST LUMPECTOMY WITH NEEDLE LOCALIZATION Right 03/03/2018   Procedure: BREAST LUMPECTOMY WITH NEEDLE LOCALIZATION;  Surgeon: Vickie Epley, MD;  Location: [facility] ORS;  Service: General;  Laterality: Right;  . SENTINEL NODE BIOPSY Right 03/21/2018   Procedure: RIGHT SENTINEL LYMPH  NODE BIOPSY;  Surgeon: Vickie Epley, MD;  Location: [facility] ORS;  Service: General;  Laterality: Right;    Social History   Socioeconomic History  . Marital status:  Single    Spouse name: Not on file  . Number of children: 2  . Years of education: Not on file  . Highest education level: Not on file  Occupational History  . Not on file  Social Needs  . Financial resource strain: Not on file  . Food insecurity    Worry: Not on file    Inability: Not on file  . Transportation needs    Medical: Not on file    Non-medical: Not on file  Tobacco Use  . Smoking status: Current Every Day Smoker    Packs/day: 1.00    Years: 30.00    Pack years: 30.00    Types: Cigarettes  . Smokeless tobacco: Never Used  Substance and Sexual Activity  . Alcohol use: No  . Drug use: Not Currently    Types: Marijuana    Comment: past  . Sexual activity: Not Currently  Lifestyle  . Physical activity    Days per week: Not on file    Minutes per session: Not on file  . Stress: Not on file  Relationships  . Social Herbalist on phone: Not on file    Gets together: Not on file    Attends religious service: Not on file    Active member of club or organization: Not on file    Attends meetings of clubs or organizations: Not on file    Relationship status: Not on file  . Intimate partner violence    Fear of current or ex partner: Not on file    Emotionally abused: Not on file    Physically abused: Not on file    Forced sexual activity: Not on file  Other Topics Concern  . Not on file  Social History Narrative  . Not on file    Family History  Problem Relation Age of Onset  . Breast cancer Mother 34  . Lung cancer Mother   . Breast cancer Maternal Grandmother   . Thyroid cancer Father      Current Outpatient Medications:  .  acetaminophen (TYLENOL) 500 MG tablet, Take 500 mg by mouth every 6 (six) hours as needed., Disp: , Rfl:  .  albuterol (PROVENTIL HFA;VENTOLIN HFA) 108 (90 Base) MCG/ACT inhaler, Inhale 2 puffs into the lungs every 6 (six) hours as needed for wheezing or shortness of breath., Disp: 1 Inhaler, Rfl: 0 .  anastrozole  (ARIMIDEX) 1 MG tablet, TAKE 1 TABLET BY MOUTH ONCE DAILY, Disp: 30 tablet, Rfl: 3 .  Calcium 600-400 MG-UNIT CHEW, Chew 1 Dose by mouth 2 (two) times daily., Disp: , Rfl:  .  Multiple Vitamins-Minerals (ALIVE WOMENS ENERGY PO), Take 1 Dose by mouth daily., Disp: , Rfl:  .  sertraline (ZOLOFT) 50 MG tablet, Take 1 tablet (50 mg total) by mouth daily., Disp: 90 tablet, Rfl: 1  Physical exam:  Vitals:   10/28/18 0925  BP: 108/64  Pulse: 81  Resp: 16  Temp: 97.6 F (36.4 C)  TempSrc: Tympanic  Weight: 203 lb 8 oz (92.3 kg)  Height: 5' 3" (1.6 m)   Physical Exam Constitutional:      General: She is not in acute distress. HENT:     Head: Normocephalic and atraumatic.  Eyes:     Pupils: Pupils are equal, round, and reactive to light.  Neck:     Musculoskeletal: Normal range of motion.  Cardiovascular:     Rate and Rhythm: Normal rate and regular rhythm.     Heart sounds: Normal heart sounds.  Pulmonary:     Effort: Pulmonary effort is normal.     Breath sounds: Normal breath sounds.  Abdominal:     General: Bowel sounds are normal.     Palpations: Abdomen is soft.  Skin:    General: Skin is warm and dry.  Neurological:     Mental Status: She is alert and oriented to person, place, and time.      CMP Latest Ref Rng & Units 07/25/2018  Glucose 70 - 99 mg/dL 120(H)  BUN 6 - 20 mg/dL 11  Creatinine 0.44 - 1.00 mg/dL 0.69  Sodium 135 - 145 mmol/L 140  Potassium 3.5 - 5.1 mmol/L 4.3  Chloride 98 - 111 mmol/L 103  CO2 22 - 32 mmol/L 29  Calcium 8.9 - 10.3 mg/dL 8.7(L)  Total Protein 6.5 - 8.1 g/dL 7.2  Total Bilirubin 0.3 - 1.2 mg/dL 0.3  Alkaline Phos 38 - 126 U/L 61  AST 15 - 41 U/L 17  ALT 0 - 44 U/L 20   CBC Latest Ref Rng & Units 06/12/2018  WBC 4.0 - 10.5 K/uL 7.7  Hemoglobin 12.0 - 15.0 g/dL 13.6  Hematocrit 36.0 - 46.0 % 41.0  Platelets 150 - 400 K/uL 268      Assessment and plan- Patient is a 52 y.o. 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    I discussed the results of the bone density scan which were consistent with osteopenia in the left femur neck with a T score of -1.3.  However her 10-year probability of a major osteoporotic fracture was less than 20% and her hip fracture was less than 3%.  She therefore does not require any bisphosphonate therapy at this time.  Patient will continue to take prophylactic calcium and vitamin D.  She will continue Arimidex for total.  Of 5 years.  I will see her back in 3 months no labs   Visit Diagnosis 1. Visit for monitoring Arimidex therapy   2. Osteopenia of neck of left femur      Dr. Archana Rao, MD, MPH [facility] [phone] 10/28/2018 2:44 PM                

## 2018-10-28 NOTE — Progress Notes (Signed)
Pt states she is doing good on AI and takes it at night, she sometimes has breast pain that is quick and goes away quick/

## 2018-12-01 ENCOUNTER — Ambulatory Visit
Admission: RE | Admit: 2018-12-01 | Discharge: 2018-12-01 | Disposition: A | Discharge status: Home / Self Care | Payer: Medicaid Other | Source: Ambulatory Visit | Attending: Surgery | Admitting: Surgery

## 2018-12-01 DIAGNOSIS — C50911 Malignant neoplasm of unspecified site of right female breast: Secondary | ICD-10-CM | POA: Diagnosis present

## 2018-12-03 ENCOUNTER — Telehealth: Payer: Self-pay

## 2018-12-03 NOTE — Telephone Encounter (Signed)
-----   Message from Jules Husbands, MD sent at 12/03/2018  1:32 PM EDT ----- Please let her know her mammo is nml ----- Message ----- From: Interface, Rad Results In Sent: 12/01/2018   3:42 PM EDT To: Jules Husbands, MD

## 2018-12-03 NOTE — Telephone Encounter (Signed)
Spoke with patient to notify her per Dr.Pabon that her recent Mammo was normal. Patient verbalizes understanding.

## 2018-12-08 ENCOUNTER — Ambulatory Visit: Payer: Medicaid Other | Admitting: Surgery

## 2018-12-26 ENCOUNTER — Ambulatory Visit: Payer: Medicaid Other | Admitting: Radiation Oncology

## 2019-01-06 ENCOUNTER — Other Ambulatory Visit: Payer: Self-pay

## 2019-01-06 ENCOUNTER — Telehealth: Payer: Self-pay

## 2019-01-06 DIAGNOSIS — R062 Wheezing: Secondary | ICD-10-CM

## 2019-01-06 MED ORDER — ALBUTEROL SULFATE HFA 108 (90 BASE) MCG/ACT IN AERS: 2.0000 | INHALATION_SPRAY | Freq: Four times a day (QID) | RESPIRATORY_TRACT | 0 refills | Status: DC | PRN

## 2019-01-06 NOTE — Telephone Encounter (Signed)
Confirmed pt sleep study for 01/07/2019. beth

## 2019-01-07 ENCOUNTER — Ambulatory Visit (INDEPENDENT_AMBULATORY_CARE_PROVIDER_SITE_OTHER): Payer: Medicaid Other | Admitting: Internal Medicine

## 2019-01-07 DIAGNOSIS — G4733 Obstructive sleep apnea (adult) (pediatric): Secondary | ICD-10-CM

## 2019-01-07 DIAGNOSIS — G4710 Hypersomnia, unspecified: Secondary | ICD-10-CM

## 2019-01-07 DIAGNOSIS — R0683 Snoring: Secondary | ICD-10-CM

## 2019-01-18 NOTE — Procedures (Signed)
[place] [address] [place], [place] [address]  Patient Name: Margaret Blackburn DOB: 1965/07/11   SLEEP STUDY INTERPRETATION  DATE OF SERVICE: January 07, 2019   SLEEP STUDY HISTORY: This patient is referred to the sleep lab for a baseline Polysomnography. Pertinent history includes a history of diagnosis of excessive daytime somnolence and snoring.  PROCEDURE: This overnight polysomnogram was performed using the Alice 5 acquisition system using the standard diagnostic protocol as outlined by the AASM. This includes 6 channels of EEG, 2 channelscannels of EOG, chin EMG, bilateral anterior tibialis EMG, nasal/oral thermister, PTAF, chest and abdominal wall movements, ECG and pulse oximetry. Apneas and Hypopneas were scored per AASM definition.  SLEEP ARCHITECHTURE: This is a baseline polysomnograph  study. The total recording time was 412.5 minutes and the patients total sleep time is noted to be 316.5 minutes. Sleep onset latency was 42.0 minutes and is prolonged.  Stage R sleep onset latency was 231.5 minutes. Sleep maintenance efficiency was 76.8% and is decreased.  Sleep staging expressed as a percentage of total sleep time demonstrated 16.3% N1, 49.9% N2 and 30.6% N3  sleep. Stage R represents 3.2% of total sleep time. This is decreased.  There were a total of 182 arousals  for an overall arousal index of 34.5 per hour of sleep. PLMS arousal were not noted. Arousals without respiratory events are  noted. This can contribute to sleep architechture disruption.  RESPIRATORY MONITORING:   Patient exhibits significant evidence of sleep disorderd breathing characterized by 0 central apneas, 207 obstructive apneas and 0 mixed apneas. There were 0 obstructive hypopneas and 0 RERAs. Most of the apneas/hypopneas were of obstructive variety. The total apnea hypopnea index (apneas and hypopneas per hour of sleep) is 39.2 respiratory events per hour and is severe.  Respiratory  monitoring demonstrated severe snoring through the night. There are a total of 1241 snoring episodes representing 45.8% of sleep.   Baseline oxygen saturation during wakefulness was 94% and during NREM sleep averaged 92% through the night. Arterial saturation during REM sleep was 86% through the night. There was significant  oxygen desaturation with the respiratory events. Arterial oxygen desaturation occurred of at least 4% was noted with a low saturation of 68%. The study was performed off oxygen.  CARDIAC MONITORING:   Average heart rate is 82 during sleep with a high of 105 beats per minute. Malignant arrhythmias were not noted.    IMPRESSIONS:  --This overnight polysomnogram demonstrates presence of severe obstructive sleep apnea with an overall AHI 39.2 per hour. --The overall AHI was significantly worse  during Stage R. --There were associated severe arterial oxygen desaturations noted with a lowest saturation of 68% --There was no significant PLMS noted in this study. --There is severe snoring noted throughout the study.    RECOMMENDATIONS:  --CPAP titration study is indicated in this case to determine optimal response to therapy with positive airway pressure.. --Nasal decongestants and antihistamines may be of help for increased upper airways resistance when present. --Weight loss through dietary and lifestyle modification is recommended in the presence of obesity. --A search for and treatment of any underlying cardiopulmonary disease is      recommended in the presence of oxygen desaturations. --Alternative treatment options if the patient is not willing to use CPAP include oral   appliances as well as surgical intervention which may help in the appropriate patient. --Clinical correlation is recommended. Please feel free to call the office for any further  questions or assistance in  the care of this patient.     Allyne Gee, MD [facility] Pulmonary Critical Care Medicine Sleep  medicine

## 2019-01-19 ENCOUNTER — Telehealth: Payer: Self-pay

## 2019-01-19 ENCOUNTER — Telehealth: Payer: Self-pay | Admitting: *Deleted

## 2019-01-19 NOTE — Telephone Encounter (Signed)
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 They just need a note. I spoke to Dr. Hedwig Morton and she states that it is not from her breast cancer-she is doing good with that. I explained to her that she will need to contact the md that takes care of her COPD. When I call back to tell her the response from Dr. Janese Banks -I got her voicemail and I left message above with this info and she can call me back [phone].

## 2019-01-19 NOTE — Telephone Encounter (Signed)
Confirmed appointment with patient. klh °

## 2019-01-19 NOTE — Telephone Encounter (Signed)
Patient called requesting a letter for unemployment and asked you call her back for specifics. [phone]

## 2019-01-21 ENCOUNTER — Ambulatory Visit: Payer: Medicaid Other | Admitting: Internal Medicine

## 2019-01-22 ENCOUNTER — Ambulatory Visit: Payer: Medicaid Other | Attending: Radiation Oncology | Admitting: Radiation Oncology

## 2019-01-22 ENCOUNTER — Ambulatory Visit: Payer: Medicaid Other | Admitting: Oncology

## 2019-01-22 ENCOUNTER — Encounter: Payer: Self-pay | Admitting: Internal Medicine

## 2019-01-22 ENCOUNTER — Other Ambulatory Visit: Payer: Self-pay

## 2019-01-22 ENCOUNTER — Ambulatory Visit: Payer: Medicaid Other | Admitting: Internal Medicine

## 2019-01-22 ENCOUNTER — Telehealth: Payer: Self-pay

## 2019-01-22 VITALS — Ht 62.0 in | Wt 210.0 lb

## 2019-01-22 DIAGNOSIS — Z6838 Body mass index (BMI) 38.0-38.9, adult: Secondary | ICD-10-CM

## 2019-01-22 DIAGNOSIS — F17200 Nicotine dependence, unspecified, uncomplicated: Secondary | ICD-10-CM

## 2019-01-22 DIAGNOSIS — G4733 Obstructive sleep apnea (adult) (pediatric): Secondary | ICD-10-CM

## 2019-01-22 NOTE — Telephone Encounter (Signed)
Left a message and asked patient of sleep study date works for her and asked her to call back. Beth

## 2019-01-22 NOTE — Progress Notes (Signed)
[facility] [place], [place] [address]  Internal MEDICINE  Telephone Visit  Patient Name: Margaret Blackburn  [id number]  [id number]  Date of Service: 01/22/2019  I connected with the patient at 1207 by telephone and verified the patients identity using two identifiers.   I discussed the limitations, risks, security and privacy concerns of performing an evaluation and management service by telephone and the availability of in person appointments. I also discussed with the patient that there may be a patient responsible charge related to the service.  The patient expressed understanding and agrees to proceed.    Chief Complaint  Patient presents with  . Telephone Assessment  . Telephone Screen  . Follow-up    sleep study    HPI  Pt is seen via telephone. She had a sleep study peformed in lab at the beginning of the month. Her study shows 207 Obstructive sleep apneas with an overall ahi of 39.2.  Confirming the presence of severe obstructive sleep apnea. She will need a titration study to determine optimal pressure.    Current Medication: Outpatient Encounter Medications as of 01/22/2019  Medication Sig  . acetaminophen (TYLENOL) 500 MG tablet Take 500 mg by mouth every 6 (six) hours as needed.  Marland Kitchen albuterol (VENTOLIN HFA) 108 (90 Base) MCG/ACT inhaler Inhale 2 puffs into the lungs every 6 (six) hours as needed for wheezing or shortness of breath.  . anastrozole (ARIMIDEX) 1 MG tablet TAKE 1 TABLET BY MOUTH ONCE DAILY  . Calcium 600-400 MG-UNIT CHEW Chew 1 Dose by mouth 2 (two) times daily.  . Multiple Vitamins-Minerals (ALIVE WOMENS ENERGY PO) Take 1 Dose by mouth daily.  . sertraline (ZOLOFT) 50 MG tablet Take 1 tablet (50 mg total) by mouth daily.   No facility-administered encounter medications on file as of 01/22/2019.    Surgical History: Past Surgical History:  Procedure Laterality Date  . ABDOMINAL HYSTERECTOMY    . BACK SURGERY    . BREAST  BIOPSY Right 12/25/2017   affirm bx , x clip,TUBULAR  CARCINOMA  . 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 BREAST LUMPECTOMY WITH NEEDLE LOCALIZATION Right 03/03/2018   Procedure: BREAST LUMPECTOMY WITH NEEDLE LOCALIZATION;  Surgeon: Vickie Epley, MD;  Location: [facility] ORS;  Service: General;  Laterality: Right;  . SENTINEL NODE BIOPSY Right 03/21/2018   Procedure: RIGHT SENTINEL LYMPH  NODE BIOPSY;  Surgeon: Vickie Epley, MD;  Location: [facility] ORS;  Service: General;  Laterality: Right;    Medical History: Past Medical History:  Diagnosis Date  . Allergy   . Arthritis   . Breast cancer ([place]) 12/2017   right breast  . COPD (chronic obstructive pulmonary disease) ([place])   . Depression   . Personal history of radiation therapy   . Sleep apnea     Family History: Family History  Problem Relation Age of Onset  . Breast cancer Mother 77  . Lung cancer Mother   . Breast cancer Maternal Grandmother   . Thyroid cancer Father     Social History   Socioeconomic History  . Marital status: Single    Spouse name: Not on file  . Number of children: 2  . Years of education: Not on file  . Highest education level: Not on file  Occupational History  . Not on file  Tobacco Use  . Smoking status: Current Every Day Smoker    Packs/day: 1.00    Years: 30.00    Pack years:  30.00    Types: Cigarettes  . Smokeless tobacco: Never Used  Substance and Sexual Activity  . Alcohol use: No  . Drug use: Not Currently    Types: Marijuana    Comment: past  . Sexual activity: Not Currently  Other Topics Concern  . Not on file  Social History Narrative  . Not on file   Social Determinants of Health   Financial Resource Strain:   . Difficulty of Paying Living Expenses: Not on file  Food Insecurity:   . Worried About Charity fundraiser in the Last Year: Not on file  . Ran Out of Food in the Last Year: Not on file  Transportation Needs:   . Lack of Transportation  (Medical): Not on file  . Lack of Transportation (Non-Medical): Not on file  Physical Activity:   . Days of Exercise per Week: Not on file  . Minutes of Exercise per Session: Not on file  Stress:   . Feeling of Stress : Not on file  Social Connections:   . Frequency of Communication with Friends and Family: Not on file  . Frequency of Social Gatherings with Friends and Family: Not on file  . Attends Religious Services: Not on file  . Active Member of Clubs or Organizations: Not on file  . Attends Archivist Meetings: Not on file  . Marital Status: Not on file  Intimate Partner Violence:   . Fear of Current or Ex-Partner: Not on file  . Emotionally Abused: Not on file  . Physically Abused: Not on file  . Sexually Abused: Not on file      Review of Systems  Constitutional: Negative for chills, fatigue and unexpected weight change.  HENT: Negative for congestion, rhinorrhea, sneezing and sore throat.   Eyes: Negative for photophobia, pain and redness.  Respiratory: Negative for cough, chest tightness and shortness of breath.   Cardiovascular: Negative for chest pain and palpitations.  Gastrointestinal: Negative for abdominal pain, constipation, diarrhea, nausea and vomiting.  Endocrine: Negative.   Genitourinary: Negative for dysuria and frequency.  Musculoskeletal: Negative for arthralgias, back pain, joint swelling and neck pain.  Skin: Negative for rash.  Allergic/Immunologic: Negative.   Neurological: Negative for tremors and numbness.  Hematological: Negative for adenopathy. Does not bruise/bleed easily.  Psychiatric/Behavioral: Negative for behavioral problems and sleep disturbance. The patient is not nervous/anxious.     Vital Signs: Ht 5\' 2"  (1.575 m)   Wt 210 lb (95.3 kg)   BMI 38.41 kg/m    Observation/Objective:  Well sounding, NAD noted.   Assessment/Plan: 1. Obstructive sleep apnea Will have cpap titration to determine optimal pressure for  cpap machine. Follow up after study.  - Cpap titration; Future  2. BMI 38.0-38.9,adult Obesity Counseling: Risk Assessment: An assessment of behavioral risk factors was made today and includes lack of exercise sedentary lifestyle, lack of portion control and poor dietary habits.  Risk Modification Advice: She was counseled on portion control guidelines. Restricting daily caloric intake to 1600. The detrimental long term effects of obesity on her health and ongoing poor compliance was also discussed with the patient.  3. Nicotine dependence with current use Smoking cessation counseling: 1. Pt acknowledges the risks of long term smoking, she will try to quite smoking. 2. Options for different medications including nicotine products, chewing gum, patch etc, Wellbutrin and Chantix is discussed 3. Goal and date of compete cessation is discussed 4. Total time spent in smoking cessation is 15 min.   General Counseling:  Shavona verbalizes understanding of the findings of today's phone visit and agrees with plan of treatment. I have discussed any further diagnostic evaluation that may be needed or ordered today. We also reviewed her medications today. she has been encouraged to call the office with any questions or concerns that should arise related to todays visit.    Orders Placed This Encounter  Procedures  . Cpap titration    No orders of the defined types were placed in this encounter.   Time spent: [facility] Internal medicine

## 2019-01-23 ENCOUNTER — Ambulatory Visit: Payer: Medicaid Other | Admitting: Adult Health

## 2019-01-23 ENCOUNTER — Other Ambulatory Visit: Payer: Self-pay

## 2019-01-23 ENCOUNTER — Telehealth: Payer: Self-pay

## 2019-01-23 ENCOUNTER — Encounter: Payer: Self-pay | Admitting: Adult Health

## 2019-01-23 DIAGNOSIS — F17200 Nicotine dependence, unspecified, uncomplicated: Secondary | ICD-10-CM

## 2019-01-23 DIAGNOSIS — J449 Chronic obstructive pulmonary disease, unspecified: Secondary | ICD-10-CM

## 2019-01-23 DIAGNOSIS — Z9109 Other allergy status, other than to drugs and biological substances: Secondary | ICD-10-CM

## 2019-01-23 DIAGNOSIS — M1990 Unspecified osteoarthritis, unspecified site: Secondary | ICD-10-CM | POA: Diagnosis not present

## 2019-01-23 MED ORDER — MONTELUKAST SODIUM 10 MG PO TABS: 10.0000 mg | ORAL_TABLET | Freq: Every day | ORAL | 3 refills | Status: DC

## 2019-01-23 MED ORDER — BUDESONIDE-FORMOTEROL FUMARATE 160-4.5 MCG/ACT IN AERO: 2.0000 | INHALATION_SPRAY | Freq: Two times a day (BID) | RESPIRATORY_TRACT | 3 refills | Status: DC

## 2019-01-23 MED ORDER — CELECOXIB 100 MG PO CAPS: 100.0000 mg | ORAL_CAPSULE | Freq: Two times a day (BID) | ORAL | 1 refills | Status: DC

## 2019-01-23 NOTE — Progress Notes (Signed)
[facility] [place], [place] [address]  Internal MEDICINE  Telephone Visit  Patient Name: Margaret Blackburn  [id number]  [id number]  Date of Service: 02/08/2019  I connected with the patient at 1100 by telephone and verified the patients identity using two identifiers.   I discussed the limitations, risks, security and privacy concerns of performing an evaluation and management service by telephone and the availability of in person appointments. I also discussed with the patient that there may be a patient responsible charge related to the service.  The patient expressed understanding and agrees to proceed.    Chief Complaint  Patient presents with  . Telephone Screen  . Telephone Assessment  . Back Pain    stiffed and numbness  . Medical Management of Chronic Issues    scratchy throat  . Urticaria    both arms    HPI  Pt seen via telephone.  She is complaining today of Lower back pain from arthritis.  She has had surgery for scoliosis as a child and has metal rods in place. The pain in her back radiates down her right leg.  She reports some numbness of that leg when she wakes up in the morning. She also has stiffness in her lower back.  Also she reports about once a month her arms swell and become itchy, with raised areas resembling hives. She denies having changed any chemicals that she uses.     Current Medication: Outpatient Encounter Medications as of 01/23/2019  Medication Sig  . acetaminophen (TYLENOL) 500 MG tablet Take 500 mg by mouth every 6 (six) hours as needed.  Marland Kitchen albuterol (VENTOLIN HFA) 108 (90 Base) MCG/ACT inhaler Inhale 2 puffs into the lungs every 6 (six) hours as needed for wheezing or shortness of breath.  . anastrozole (ARIMIDEX) 1 MG tablet TAKE 1 TABLET BY MOUTH ONCE DAILY  . Calcium 600-400 MG-UNIT CHEW Chew 1 Dose by mouth 2 (two) times daily.  . Multiple Vitamins-Minerals (ALIVE WOMENS ENERGY PO) Take 1 Dose by mouth daily.  .  sertraline (ZOLOFT) 50 MG tablet Take 1 tablet (50 mg total) by mouth daily.  . budesonide-formoterol (SYMBICORT) 160-4.5 MCG/ACT inhaler Inhale 2 puffs into the lungs 2 (two) times daily.  . celecoxib (CELEBREX) 100 MG capsule Take 1 capsule (100 mg total) by mouth 2 (two) times daily.  . montelukast (SINGULAIR) 10 MG tablet Take 1 tablet (10 mg total) by mouth at bedtime.   No facility-administered encounter medications on file as of 01/23/2019.    Surgical History: Past Surgical History:  Procedure Laterality Date  . ABDOMINAL HYSTERECTOMY    . BACK SURGERY    . BREAST BIOPSY Right 12/25/2017   affirm bx , x clip,TUBULAR  CARCINOMA  . 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 BREAST LUMPECTOMY WITH NEEDLE LOCALIZATION Right 03/03/2018   Procedure: BREAST LUMPECTOMY WITH NEEDLE LOCALIZATION;  Surgeon: Vickie Epley, MD;  Location: [facility] ORS;  Service: General;  Laterality: Right;  . SENTINEL NODE BIOPSY Right 03/21/2018   Procedure: RIGHT SENTINEL LYMPH  NODE BIOPSY;  Surgeon: Vickie Epley, MD;  Location: [facility] ORS;  Service: General;  Laterality: Right;    Medical History: Past Medical History:  Diagnosis Date  . Allergy   . Arthritis   . Breast cancer ([place]) 12/2017   right breast  . COPD (chronic obstructive pulmonary disease) ([place])   . Depression   . Personal history of radiation therapy   . Sleep apnea  Family History: Family History  Problem Relation Age of Onset  . Breast cancer Mother 4  . Lung cancer Mother   . Breast cancer Maternal Grandmother   . Thyroid cancer Father     Social History   Socioeconomic History  . Marital status: Single    Spouse name: Not on file  . Number of children: 2  . Years of education: Not on file  . Highest education level: Not on file  Occupational History  . Not on file  Tobacco Use  . Smoking status: Current Every Day Smoker    Packs/day: 1.00    Years: 30.00    Pack years: 30.00    Types:  Cigarettes  . Smokeless tobacco: Never Used  Substance and Sexual Activity  . Alcohol use: No  . Drug use: Not Currently    Types: Marijuana    Comment: past  . Sexual activity: Not Currently  Other Topics Concern  . Not on file  Social History Narrative  . Not on file   Social Determinants of Health   Financial Resource Strain:   . Difficulty of Paying Living Expenses: Not on file  Food Insecurity:   . Worried About Charity fundraiser in the Last Year: Not on file  . Ran Out of Food in the Last Year: Not on file  Transportation Needs:   . Lack of Transportation (Medical): Not on file  . Lack of Transportation (Non-Medical): Not on file  Physical Activity:   . Days of Exercise per Week: Not on file  . Minutes of Exercise per Session: Not on file  Stress:   . Feeling of Stress : Not on file  Social Connections:   . Frequency of Communication with Friends and Family: Not on file  . Frequency of Social Gatherings with Friends and Family: Not on file  . Attends Religious Services: Not on file  . Active Member of Clubs or Organizations: Not on file  . Attends Archivist Meetings: Not on file  . Marital Status: Not on file  Intimate Partner Violence:   . Fear of Current or Ex-Partner: Not on file  . Emotionally Abused: Not on file  . Physically Abused: Not on file  . Sexually Abused: Not on file      Review of Systems  Constitutional: Negative for chills, fatigue and unexpected weight change.  HENT: Negative for congestion, rhinorrhea, sneezing and sore throat.   Eyes: Negative for photophobia, pain and redness.  Respiratory: Negative for cough, chest tightness and shortness of breath.   Cardiovascular: Negative for chest pain and palpitations.  Gastrointestinal: Negative for abdominal pain, constipation, diarrhea, nausea and vomiting.  Endocrine: Negative.   Genitourinary: Negative for dysuria and frequency.  Musculoskeletal: Negative for arthralgias, back  pain, joint swelling and neck pain.  Skin: Negative for rash.  Allergic/Immunologic: Negative.   Neurological: Negative for tremors and numbness.  Hematological: Negative for adenopathy. Does not bruise/bleed easily.  Psychiatric/Behavioral: Negative for behavioral problems and sleep disturbance. The patient is not nervous/anxious.     Vital Signs: There were no vitals taken for this visit.   Observation/Objective:  Well sounding,    Assessment/Plan: 1. Arthritis Will try Celebrex, discussed not using other NSaids with patient.  She verbalized understanding.  Follow up as scheduled.  - celecoxib (CELEBREX) 100 MG capsule; Take 1 capsule (100 mg total) by mouth 2 (two) times daily.  Dispense: 60 capsule; Refill: 1  2. Environmental allergies Start Singulair, and instructed patient to  continue otc treatment of hives if they return.  She should keep a food journal, as well as any chemical products she uses near reaction.  - montelukast (SINGULAIR) 10 MG tablet; Take 1 tablet (10 mg total) by mouth at bedtime.  Dispense: 30 tablet; Refill: 3  3. Chronic obstructive pulmonary disease, unspecified COPD type ([place]) Stable, continue symbicort as directed.  Refills provided.  - budesonide-formoterol (SYMBICORT) 160-4.5 MCG/ACT inhaler; Inhale 2 puffs into the lungs 2 (two) times daily.  Dispense: 1 Inhaler; Refill: 3  4. Nicotine dependence with current use Smoking cessation counseling: 1. Pt acknowledges the risks of long term smoking, she will try to quite smoking. 2. Options for different medications including nicotine products, chewing gum, patch etc, Wellbutrin and Chantix is discussed 3. Goal and date of compete cessation is discussed 4. Total time spent in smoking cessation is 15 min.   General Counseling: Karinah verbalizes understanding of the findings of today's phone visit and agrees with plan of treatment. I have discussed any further diagnostic evaluation that may be needed  or ordered today. We also reviewed her medications today. she has been encouraged to call the office with any questions or concerns that should arise related to todays visit.    No orders of the defined types were placed in this encounter.   Meds ordered this encounter  Medications  . montelukast (SINGULAIR) 10 MG tablet    Sig: Take 1 tablet (10 mg total) by mouth at bedtime.    Dispense:  30 tablet    Refill:  3  . celecoxib (CELEBREX) 100 MG capsule    Sig: Take 1 capsule (100 mg total) by mouth 2 (two) times daily.    Dispense:  60 capsule    Refill:  1  . budesonide-formoterol (SYMBICORT) 160-4.5 MCG/ACT inhaler    Sig: Inhale 2 puffs into the lungs 2 (two) times daily.    Dispense:  1 Inhaler    Refill:  3    Time spent: Yarnell AGNP-C Internal medicine

## 2019-01-23 NOTE — Telephone Encounter (Signed)
Called lmom informing patient of appointment. klh 

## 2019-01-26 ENCOUNTER — Other Ambulatory Visit: Payer: Self-pay

## 2019-01-26 MED ORDER — CHANTIX STARTING MONTH PAK 0.5 MG X 11 & 1 MG X 42 PO TABS: ORAL_TABLET | ORAL | 0 refills | Status: DC

## 2019-01-27 ENCOUNTER — Encounter: Payer: Medicaid Other | Admitting: Internal Medicine

## 2019-02-05 ENCOUNTER — Telehealth: Payer: Self-pay

## 2019-02-05 NOTE — Telephone Encounter (Signed)
Confirmed appointment with patient. klh °

## 2019-02-10 ENCOUNTER — Ambulatory Visit (INDEPENDENT_AMBULATORY_CARE_PROVIDER_SITE_OTHER): Payer: Medicaid Other | Admitting: Internal Medicine

## 2019-02-10 DIAGNOSIS — G4733 Obstructive sleep apnea (adult) (pediatric): Secondary | ICD-10-CM | POA: Diagnosis not present

## 2019-02-12 ENCOUNTER — Other Ambulatory Visit: Payer: Self-pay | Admitting: Adult Health

## 2019-02-12 DIAGNOSIS — F321 Major depressive disorder, single episode, moderate: Secondary | ICD-10-CM

## 2019-02-13 ENCOUNTER — Inpatient Hospital Stay: Payer: Medicaid Other | Attending: Oncology | Admitting: Oncology

## 2019-02-13 ENCOUNTER — Other Ambulatory Visit: Payer: Self-pay

## 2019-02-13 ENCOUNTER — Encounter: Payer: Self-pay | Admitting: Oncology

## 2019-02-13 DIAGNOSIS — Z853 Personal history of malignant neoplasm of breast: Secondary | ICD-10-CM | POA: Diagnosis not present

## 2019-02-13 DIAGNOSIS — Z5181 Encounter for therapeutic drug level monitoring: Secondary | ICD-10-CM

## 2019-02-13 DIAGNOSIS — Z08 Encounter for follow-up examination after completed treatment for malignant neoplasm: Secondary | ICD-10-CM

## 2019-02-13 DIAGNOSIS — Z79811 Long term (current) use of aromatase inhibitors: Secondary | ICD-10-CM

## 2019-02-16 NOTE — Progress Notes (Signed)
I connected with Margaret Blackburn on 02/16/19 at 10:00 AM EST by video enabled telemedicine visit and verified that I am speaking with the correct person using two identifiers.   I discussed the limitations, risks, security and privacy concerns of performing an evaluation and management service by telemedicine and the availability of in-person appointments. I also discussed with the patient that there may be a patient responsible charge related to this service. The patient expressed understanding and agreed to proceed.  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 She recently underwent a screening mammogram in November 2019 which showed focal architectural distortion in the upper outer portion of the right breast. Ultrasound of the right axilla was negative for adenopathy. Core biopsy showed atypical small glandular proliferation suspicious for tubular carcinoma. 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 7 mm, grade 1 ER greater than 90% positive, PR 51 to 90% positive and HER-2/neu negative.5 sentinel LN negative for malignancy  Patient is status post hysterectomy but ovaries still in situ. Hormone levels were consistent with postmenopausal status. She completed adjuvant radiation and was started on Arimidex in May 2020  Interval history: She is tolerating Arimidex well without any significant side effects.  Denies any significant joint pains or hot flashes.  She also continues to take calcium and vitamin D.   Review of Systems  Constitutional: Negative for  chills, fever, malaise/fatigue and weight loss.  HENT: Negative for congestion, ear discharge and nosebleeds.   Eyes: Negative for blurred vision.  Respiratory: Negative for cough, hemoptysis, sputum production, shortness of breath and wheezing.   Cardiovascular: Negative for chest pain, palpitations, orthopnea and claudication.  Gastrointestinal: Negative for abdominal pain, blood in stool, constipation, diarrhea, heartburn, melena, nausea and vomiting.  Genitourinary: Negative for dysuria, flank pain, frequency, hematuria and urgency.  Musculoskeletal: Negative for back pain, joint pain and myalgias.  Skin: Negative for rash.  Neurological: Negative for dizziness, tingling, focal weakness, seizures, weakness and headaches.  Endo/Heme/Allergies: Does not bruise/bleed easily.  Psychiatric/Behavioral: Negative for depression and suicidal ideas. The patient does not have insomnia.     Allergies  Allergen Reactions  . Penicillins Rash and Other (See Comments)    DID THE REACTION INVOLVE: Swelling of the face/tongue/throat, SOB, or low BP? No Sudden or severe rash/hives, skin peeling, or the inside of the mouth or nose? Yes, blisters. Not immediate. Did it require medical treatment? Yes When did it last happen? 40s If all above answers are "NO", may proceed with cephalosporin use.     Past Medical History:  Diagnosis Date  . Allergy   . Arthritis   . Breast cancer ([place]) 12/2017   right breast  . COPD (chronic obstructive pulmonary disease) ([place])   . Depression   . Personal history of radiation therapy   . Sleep apnea     Past Surgical History:  Procedure Laterality Date  . ABDOMINAL HYSTERECTOMY    . BACK SURGERY    . BREAST BIOPSY Right 12/25/2017   affirm bx , x clip,TUBULAR  CARCINOMA  . 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 BREAST LUMPECTOMY  WITH NEEDLE LOCALIZATION Right 03/03/2018   Procedure: BREAST LUMPECTOMY WITH NEEDLE LOCALIZATION;  Surgeon:  Vickie Epley, MD;  Location: [facility] ORS;  Service: General;  Laterality: Right;  . SENTINEL NODE BIOPSY Right 03/21/2018   Procedure: RIGHT SENTINEL LYMPH  NODE BIOPSY;  Surgeon: Vickie Epley, MD;  Location: [facility] ORS;  Service: General;  Laterality: Right;    Social History   Socioeconomic History  . Marital status: Single    Spouse name: Not on file  . Number of children: 2  . Years of education: Not on file  . Highest education level: Not on file  Occupational History  . Not on file  Tobacco Use  . Smoking status: Current Every Day Smoker    Packs/day: 1.00    Years: 30.00    Pack years: 30.00    Types: Cigarettes  . Smokeless tobacco: Never Used  Substance and Sexual Activity  . Alcohol use: No  . Drug use: Not Currently    Types: Marijuana    Comment: past  . Sexual activity: Not Currently  Other Topics Concern  . Not on file  Social History Narrative  . Not on file   Social Determinants of Health   Financial Resource Strain:   . Difficulty of Paying Living Expenses: Not on file  Food Insecurity:   . Worried About Charity fundraiser in the Last Year: Not on file  . Ran Out of Food in the Last Year: Not on file  Transportation Needs:   . Lack of Transportation (Medical): Not on file  . Lack of Transportation (Non-Medical): Not on file  Physical Activity:   . Days of Exercise per Week: Not on file  . Minutes of Exercise per Session: Not on file  Stress:   . Feeling of Stress : Not on file  Social Connections:   . Frequency of Communication with Friends and Family: Not on file  . Frequency of Social Gatherings with Friends and Family: Not on file  . Attends Religious Services: Not on file  . Active Member of Clubs or Organizations: Not on file  . Attends Archivist Meetings: Not on file  . Marital Status: Not on file  Intimate Partner Violence:   . Fear of Current or Ex-Partner: Not on file  . Emotionally Abused: Not on file  . Physically  Abused: Not on file  . Sexually Abused: Not on file    Family History  Problem Relation Age of Onset  . Breast cancer Mother 88  . Lung cancer Mother   . Breast cancer Maternal Grandmother   . Thyroid cancer Father      Current Outpatient Medications:  .  acetaminophen (TYLENOL) 500 MG tablet, Take 500 mg by mouth every 6 (six) hours as needed., Disp: , Rfl:  .  albuterol (VENTOLIN HFA) 108 (90 Base) MCG/ACT inhaler, Inhale 2 puffs into the lungs every 6 (six) hours as needed for wheezing or shortness of breath., Disp: 18 g, Rfl: 0 .  anastrozole (ARIMIDEX) 1 MG tablet, TAKE 1 TABLET BY MOUTH ONCE DAILY, Disp: 30 tablet, Rfl: 3 .  budesonide-formoterol (SYMBICORT) 160-4.5 MCG/ACT inhaler, Inhale 2 puffs into the lungs 2 (two) times daily., Disp: 1 Inhaler, Rfl: 3 .  Calcium 600-400 MG-UNIT CHEW, Chew 1 Dose by mouth 2 (two) times daily., Disp: , Rfl:  .  celecoxib (CELEBREX) 100 MG capsule, Take 1 capsule (100 mg total) by mouth 2 (two) times daily., Disp: 60 capsule, Rfl: 1 .  montelukast (SINGULAIR) 10 MG tablet, Take 1 tablet (10 mg total) by mouth at bedtime., Disp: 30 tablet, Rfl: 3 .  Multiple Vitamins-Minerals (ALIVE WOMENS ENERGY PO), Take 1 Dose by mouth daily., Disp: , Rfl:  .  sertraline (ZOLOFT) 50 MG tablet, TAKE 1 TABLET BY MOUTH ONCE DAILY, Disp: 30 tablet, Rfl: 3 .  varenicline (CHANTIX STARTING MONTH PAK) 0.5 MG X 11 & 1 MG X 42 tablet, Take one 0.5 mg tablet by mouth once daily for 3 days, then increase to one 0.5 mg tablet twice daily for 4 days, then increase to one 1 mg tablet twice daily., Disp: 53 tablet, Rfl: 0  No results found.  No images are attached to the encounter.   CMP Latest Ref Rng & Units 07/25/2018  Glucose 70 - 99 mg/dL 120(H)  BUN 6 - 20 mg/dL 11  Creatinine 0.44 - 1.00 mg/dL 0.69  Sodium 135 - 145 mmol/L 140  Potassium 3.5 - 5.1 mmol/L 4.3  Chloride 98 - 111 mmol/L 103  CO2 22 - 32 mmol/L 29  Calcium 8.9 - 10.3 mg/dL 8.7(L)  Total Protein  6.5 - 8.1 g/dL 7.2  Total Bilirubin 0.3 - 1.2 mg/dL 0.3  Alkaline Phos 38 - 126 U/L 61  AST 15 - 41 U/L 17  ALT 0 - 44 U/L 20   CBC Latest Ref Rng & Units 06/12/2018  WBC 4.0 - 10.5 K/uL 7.7  Hemoglobin 12.0 - 15.0 g/dL 13.6  Hematocrit 36.0 - 46.0 % 41.0  Platelets 150 - 400 K/uL 268     Observation/objective: Does not appear in acute distress of a video visit today.  Breathing is nonlabored  Assessment and plan:Patient is a 54 y.o. 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  Recent mammogram from October 2020 was unremarkable.   Follow-up instructions: I will see her back in 6 months time for an in person visit for breast exam  I discussed the assessment and treatment plan with the patient. The patient was provided an opportunity to ask questions and all were answered. The patient agreed with the plan and demonstrated an understanding of the instructions.   The patient was advised to call back or seek an in-person evaluation if the symptoms worsen or if the condition fails to improve as anticipated.   Visit Diagnosis: 1. Encounter for follow-up surveillance of breast cancer   2. Visit for monitoring Arimidex therapy     Dr. Randa Evens, MD, MPH [facility] Pager- [phone] 02/16/2019 9:48 AM

## 2019-02-17 ENCOUNTER — Telehealth: Payer: Self-pay

## 2019-02-17 ENCOUNTER — Other Ambulatory Visit: Payer: Self-pay | Admitting: Adult Health

## 2019-02-17 DIAGNOSIS — M545 Low back pain, unspecified: Secondary | ICD-10-CM

## 2019-02-17 DIAGNOSIS — M549 Dorsalgia, unspecified: Secondary | ICD-10-CM

## 2019-02-17 NOTE — Telephone Encounter (Signed)
Called confirmed appointment with patient. klh 

## 2019-02-18 NOTE — Procedures (Signed)
[facility] [address] [place], [place] [address]  Patient Name: Margaret Blackburn DOB: 24-May-1965   SLEEP STUDY INTERPRETATION  DATE OF SERVICE: February 10, 2019   SLEEP STUDY HISTORY: This patient is referred to the sleep lab for a baseline Polysomnography. Pertinent history includes a history of diagnosis of excessive daytime somnolence and snoring.  PROCEDURE: This overnight polysomnogram was performed using the Alice 5 acquisition system using the standard diagnostic protocol as outlined by the AASM. This includes 6 channels of EEG, 2 channelscannels of EOG, chin EMG, bilateral anterior tibialis EMG, nasal/oral thermister, PTAF, chest and abdominal wall movements, ECG and pulse oximetry. Apneas and Hypopneas were scored per AASM definition.  SLEEP ARCHITECHTURE: This is a baseline polysomnograph  study. The total recording time was 388.2 minutes and the patients total sleep time is noted to be 299.5 minutes. Sleep onset latency was 25.5 minutes and is prolonged.  Stage R sleep onset latency was 120.0 minutes. Sleep maintenance efficiency was 77.8% and is decreased.  Sleep staging expressed as a percentage of total sleep time demonstrated 15.2% N1, 36.1% N2 and 6.2% N3  sleep. Stage R represents 42.6% of total sleep time. This is increased.  There were a total of 121 arousals  for an overall arousal index of 24.2 per hour of sleep. PLMS arousal were not noted. Arousals without respiratory events are  noted. This can contribute to sleep architechture disruption.  RESPIRATORY MONITORING:   Patient exhibits some evidence of sleep disorderd breathing characterized by 4 central apneas, 1 obstructive apneas and 0 mixed apneas. There were 166 obstructive hypopneas and 0 RERAs. Most of the apneas/hypopneas were of obstructive variety. The total apnea hypopnea index (apneas and hypopneas per hour of sleep) is 34.3 respiratory events per hour and is severe.  Respiratory monitoring  demonstrated severe snoring through the night. There are a total of 798 snoring episodes representing 35.2% of sleep.   Baseline oxygen saturation during wakefulness was 94% and during NREM sleep averaged 93% through the night. Arterial saturation during REM sleep was 95% through the night. There was significant  oxygen desaturation with the respiratory events. Arterial oxygen desaturation occurred of at least 4% was noted with a low saturation of 79%. The study was performed off oxygen.  CARDIAC MONITORING:   Average heart rate is 64 during sleep with a high of 93 beats per minute. Malignant arrhythmias were not noted.  CPAP titration: Patient was started on CPAP on a pressure of 5 and increased up to a pressure of 12.  On the lower pressures patient continued to have significant hypopneas but as the pressure was increased there was some improvement.  Despite the improvement in hypopneas patient still had some oxygen desaturations noted.  On a pressure of 9 CWP patient had an AHI of 3.6/h however the oxygen went down to 79%.  On a pressure of CPAP 12 CWP patient had no apneas only 5 hypopneas for an AHI of 5.5 and the lowest saturation was 89%.  Patient was not titrated beyond this level.  It appears that the patient tolerated the CPAP fairly well however may need supplemental oxygen and this should be determined further by an overnight oximetry on the CPAP of 12 or by an auto titrating clinical correlation recommended   IMPRESSIONS:  --This overnight polysomnogram demonstrates presence of severe obstructive sleep apnea with an overall AHI of 34.3 per hour. --The overall AHI was somewhat worse  during Stage R. --There were associated significant arterial oxygen  desaturations noted down to 79% --There no significant PLMS noted in this study. --There is severe snoring noted throughout the study.    RECOMMENDATIONS:  --CPAP titration study is adequate to help improve this patient's AHI it  appears that an optimal starting pressure could be 12 however it may be better to do an auto titration between 5 cm water pressure up to a maximum of 15 CWP.  In addition the patient should have a overnight oximetry done to determine if she needs supplemental oxygen.. --Nasal decongestants and antihistamines may be of help for increased upper airways resistance when present. --Weight loss through dietary and lifestyle modification is recommended in the presence of obesity. --A search for and treatment of any underlying cardiopulmonary disease is      recommended in the presence of oxygen desaturations. --Alternative treatment options if the patient is not willing to use CPAP include oral   appliances as well as surgical intervention which may help in the appropriate patient. --Clinical correlation is recommended. Please feel free to call the office for any further  questions or assistance in the care of this patient.     Allyne Gee, MD [facility] Sleep medicine

## 2019-02-19 ENCOUNTER — Other Ambulatory Visit: Payer: Self-pay

## 2019-02-19 ENCOUNTER — Ambulatory Visit: Payer: Medicaid Other | Admitting: Internal Medicine

## 2019-02-19 ENCOUNTER — Encounter: Payer: Self-pay | Admitting: Internal Medicine

## 2019-02-19 DIAGNOSIS — F17200 Nicotine dependence, unspecified, uncomplicated: Secondary | ICD-10-CM | POA: Diagnosis not present

## 2019-02-19 DIAGNOSIS — Z6838 Body mass index (BMI) 38.0-38.9, adult: Secondary | ICD-10-CM

## 2019-02-19 DIAGNOSIS — J449 Chronic obstructive pulmonary disease, unspecified: Secondary | ICD-10-CM | POA: Diagnosis not present

## 2019-02-19 DIAGNOSIS — G4733 Obstructive sleep apnea (adult) (pediatric): Secondary | ICD-10-CM

## 2019-02-19 NOTE — Progress Notes (Signed)
[facility] [place], [place] [address]  Internal MEDICINE  Telephone Visit  Patient Name: Margaret Blackburn  [id number]  [id number]  Date of Service: 02/19/2019  I connected with the patient at 1121 by telephone and verified the patients identity using two identifiers.   I discussed the limitations, risks, security and privacy concerns of performing an evaluation and management service by telephone and the availability of in person appointments. I also discussed with the patient that there may be a patient responsible charge related to the service.  The patient expressed understanding and agrees to proceed.    Chief Complaint  Patient presents with  . Follow-up    cpap titration    HPI Patient had a CPAP titration done for severe obstructive sleep apnea.  It appears that she does have a good response to the CPAP therapy.  She on pressure of CPAP 12 CWP had an improvement of her AHI from 34.3/h down to about 5.5/h.  No further titration was done beyond this level she did have oxygen desaturations and the lowest saturation was now 89%.  She unfortunately continues to smoke she is using Chantix and states that she is down to about 1 pack/day from 2 packs/day and she is going to continue to try to taper herself down to no smoke at all.  Her hypoxia does concern me also and I did explain to her that she may end up having to be placed on oxygen and supplementation.  As far as the CPAP is concerned we are going to place her on CPAP auto titrating device and we will then assess her for oxygen requirements.    Current Medication: Outpatient Encounter Medications as of 02/19/2019  Medication Sig  . acetaminophen (TYLENOL) 500 MG tablet Take 500 mg by mouth every 6 (six) hours as needed.  Marland Kitchen albuterol (VENTOLIN HFA) 108 (90 Base) MCG/ACT inhaler Inhale 2 puffs into the lungs every 6 (six) hours as needed for wheezing or shortness of breath.  . anastrozole (ARIMIDEX) 1 MG tablet  TAKE 1 TABLET BY MOUTH ONCE DAILY  . budesonide-formoterol (SYMBICORT) 160-4.5 MCG/ACT inhaler Inhale 2 puffs into the lungs 2 (two) times daily.  . Calcium 600-400 MG-UNIT CHEW Chew 1 Dose by mouth 2 (two) times daily.  . celecoxib (CELEBREX) 100 MG capsule Take 1 capsule (100 mg total) by mouth 2 (two) times daily.  . montelukast (SINGULAIR) 10 MG tablet Take 1 tablet (10 mg total) by mouth at bedtime.  . Multiple Vitamins-Minerals (ALIVE WOMENS ENERGY PO) Take 1 Dose by mouth daily.  . sertraline (ZOLOFT) 50 MG tablet TAKE 1 TABLET BY MOUTH ONCE DAILY  . varenicline (CHANTIX STARTING MONTH PAK) 0.5 MG X 11 & 1 MG X 42 tablet Take one 0.5 mg tablet by mouth once daily for 3 days, then increase to one 0.5 mg tablet twice daily for 4 days, then increase to one 1 mg tablet twice daily.   No facility-administered encounter medications on file as of 02/19/2019.    Surgical History: Past Surgical History:  Procedure Laterality Date  . ABDOMINAL HYSTERECTOMY    . BACK SURGERY    . BREAST BIOPSY Right 12/25/2017   affirm bx , x clip,TUBULAR  CARCINOMA  . 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 BREAST LUMPECTOMY WITH NEEDLE LOCALIZATION Right 03/03/2018   Procedure: BREAST LUMPECTOMY WITH NEEDLE LOCALIZATION;  Surgeon: Vickie Epley, MD;  Location: [facility] ORS;  Service: General;  Laterality: Right;  .  SENTINEL NODE BIOPSY Right 03/21/2018   Procedure: RIGHT SENTINEL LYMPH  NODE BIOPSY;  Surgeon: Vickie Epley, MD;  Location: [facility] ORS;  Service: General;  Laterality: Right;    Medical History: Past Medical History:  Diagnosis Date  . Allergy   . Arthritis   . Breast cancer ([place]) 12/2017   right breast  . COPD (chronic obstructive pulmonary disease) ([place])   . Depression   . Personal history of radiation therapy   . Sleep apnea     Family History: Family History  Problem Relation Age of Onset  . Breast cancer Mother 71  . Lung cancer Mother   . Breast cancer  Maternal Grandmother   . Thyroid cancer Father     Social History   Socioeconomic History  . Marital status: Single    Spouse name: Not on file  . Number of children: 2  . Years of education: Not on file  . Highest education level: Not on file  Occupational History  . Not on file  Tobacco Use  . Smoking status: Current Every Day Smoker    Packs/day: 1.00    Years: 30.00    Pack years: 30.00    Types: Cigarettes  . Smokeless tobacco: Never Used  Substance and Sexual Activity  . Alcohol use: No  . Drug use: Not Currently    Types: Marijuana    Comment: past  . Sexual activity: Not Currently  Other Topics Concern  . Not on file  Social History Narrative  . Not on file   Social Determinants of Health   Financial Resource Strain:   . Difficulty of Paying Living Expenses: Not on file  Food Insecurity:   . Worried About Charity fundraiser in the Last Year: Not on file  . Ran Out of Food in the Last Year: Not on file  Transportation Needs:   . Lack of Transportation (Medical): Not on file  . Lack of Transportation (Non-Medical): Not on file  Physical Activity:   . Days of Exercise per Week: Not on file  . Minutes of Exercise per Session: Not on file  Stress:   . Feeling of Stress : Not on file  Social Connections:   . Frequency of Communication with Friends and Family: Not on file  . Frequency of Social Gatherings with Friends and Family: Not on file  . Attends Religious Services: Not on file  . Active Member of Clubs or Organizations: Not on file  . Attends Archivist Meetings: Not on file  . Marital Status: Not on file  Intimate Partner Violence:   . Fear of Current or Ex-Partner: Not on file  . Emotionally Abused: Not on file  . Physically Abused: Not on file  . Sexually Abused: Not on file      Review of Systems  No cough no congestion no hemoptysis No chest pain no palpitations No fever no chills No nausea no vomiting. Admits to being sleepy  and tired all the time and lots of fatigue Admits to having shortness of breath when she exerts herself   Vital Signs: There were no vitals taken for this visit.   Observation/Objective: Grossly appeared normal no acute distress    Assessment/Plan: #1.  Obstructive sleep apnea she has significant OSA which does appear to respond to CPAP therapy she will be placed on auto titrating CPAP device.  As mentioned above we might need to assess her for possibility of using nocturnal supplemental oxygen and I did  discuss this with her at length.  2.  COPD severe disease continues to smoke we had a discussion regarding smoking cessation counseling she is going to continue with the Chantix she states that she has now come down to 1 pack/day from 2 packs/day and she will continue to wean herself down.  3.  Nicotine addiction she continues to smoke as already mentioned above and discussed at length with her she is working on cessation  4.  Morbid obesity she needs to work on diet and exercise  General Counseling: Romola verbalizes understanding of the findings of today's phone visit and agrees with plan of treatment. I have discussed any further diagnostic evaluation that may be needed or ordered today. We also reviewed her medications today. she has been encouraged to call the office with any questions or concerns that should arise related to todays visit.    Orders Placed This Encounter  Procedures  . For home use only DME continuous positive airway pressure (CPAP)    No orders of the defined types were placed in this encounter.   Time spent:20 [place] MD [facility] Pulmonary Medicine

## 2019-02-19 NOTE — Patient Instructions (Signed)

## 2019-02-20 ENCOUNTER — Ambulatory Visit
Admission: RE | Admit: 2019-02-20 | Discharge: 2019-02-20 | Disposition: A | Discharge status: Home / Self Care | Payer: Medicaid Other | Source: Ambulatory Visit | Attending: Adult Health | Admitting: Adult Health

## 2019-02-20 ENCOUNTER — Other Ambulatory Visit: Payer: Self-pay

## 2019-02-20 DIAGNOSIS — M549 Dorsalgia, unspecified: Secondary | ICD-10-CM

## 2019-02-20 DIAGNOSIS — M545 Low back pain, unspecified: Secondary | ICD-10-CM

## 2019-02-23 ENCOUNTER — Telehealth: Payer: Self-pay

## 2019-02-23 NOTE — Telephone Encounter (Signed)
Gave [employer] information for new CPAP clinic set up. Beth

## 2019-03-03 ENCOUNTER — Telehealth: Payer: Self-pay

## 2019-03-03 ENCOUNTER — Other Ambulatory Visit: Payer: Self-pay | Admitting: Oncology

## 2019-03-03 NOTE — Telephone Encounter (Signed)
Confirmed virtual visit with patient. klh 

## 2019-03-05 ENCOUNTER — Other Ambulatory Visit: Payer: Self-pay

## 2019-03-05 ENCOUNTER — Encounter: Payer: Self-pay | Admitting: Adult Health

## 2019-03-05 ENCOUNTER — Ambulatory Visit: Payer: Medicaid Other | Admitting: Adult Health

## 2019-03-05 VITALS — Ht 62.0 in | Wt 210.0 lb

## 2019-03-05 DIAGNOSIS — M5136 Other intervertebral disc degeneration, lumbar region: Secondary | ICD-10-CM | POA: Diagnosis not present

## 2019-03-05 DIAGNOSIS — R7301 Impaired fasting glucose: Secondary | ICD-10-CM | POA: Diagnosis not present

## 2019-03-05 DIAGNOSIS — Z0001 Encounter for general adult medical examination with abnormal findings: Secondary | ICD-10-CM

## 2019-03-05 DIAGNOSIS — Z1211 Encounter for screening for malignant neoplasm of colon: Secondary | ICD-10-CM | POA: Diagnosis not present

## 2019-03-05 DIAGNOSIS — R5383 Other fatigue: Secondary | ICD-10-CM | POA: Diagnosis not present

## 2019-03-05 NOTE — Progress Notes (Signed)
[facility] [place], [place] [address]  Internal MEDICINE  Telephone Visit  Patient Name: Margaret Blackburn  [id number]  [id number]  Date of Service: 03/05/2019  I connected with the patient at 1004 by telephone and verified the patients identity using two identifiers.   I discussed the limitations, risks, security and privacy concerns of performing an evaluation and management service by telephone and the availability of in person appointments. I also discussed with the patient that there may be a patient responsible charge related to the service.  The patient expressed understanding and agrees to proceed.    Chief Complaint  Patient presents with  . Telephone Assessment  . Telephone Screen  . Follow-up    xrays     HPI  Pt seen today to follow up on x rays.  Her thoracic spine and lumbar spine are reviewed with her. The reports are copied below. Given her ongoing paresthesia and pain she should see a spine specialist to discuss options. She is agreeable to this.  She is also due for colonoscopy to close her metric gaps, and she is aggreeable to this currently.   Lumbar spine IMPRESSION: 1. No fracture or acute finding. 2. Moderate disc degenerative changes at L5-S1. 3. Mild lower lumbar spine levoscoliosis. 4. Partly imaged changes from prior spine stabilization surgery.  Thoracic spine IMPRESSION: 1. No fracture, spondylolisthesis or acute finding. 2. Well-positioned spine stabilization rods. 3. Moderate dextroscoliosis. Current Medication: Outpatient Encounter Medications as of 03/05/2019  Medication Sig  . acetaminophen (TYLENOL) 500 MG tablet Take 500 mg by mouth every 6 (six) hours as needed.  Marland Kitchen albuterol (VENTOLIN HFA) 108 (90 Base) MCG/ACT inhaler Inhale 2 puffs into the lungs every 6 (six) hours as needed for wheezing or shortness of breath.  . anastrozole (ARIMIDEX) 1 MG tablet TAKE 1 TABLET BY MOUTH ONCE DAILY  . budesonide-formoterol  (SYMBICORT) 160-4.5 MCG/ACT inhaler Inhale 2 puffs into the lungs 2 (two) times daily.  . Calcium 600-400 MG-UNIT CHEW Chew 1 Dose by mouth 2 (two) times daily.  . celecoxib (CELEBREX) 100 MG capsule Take 1 capsule (100 mg total) by mouth 2 (two) times daily.  . montelukast (SINGULAIR) 10 MG tablet Take 1 tablet (10 mg total) by mouth at bedtime.  . Multiple Vitamins-Minerals (ALIVE WOMENS ENERGY PO) Take 1 Dose by mouth daily.  . sertraline (ZOLOFT) 50 MG tablet TAKE 1 TABLET BY MOUTH ONCE DAILY  . varenicline (CHANTIX STARTING MONTH PAK) 0.5 MG X 11 & 1 MG X 42 tablet Take one 0.5 mg tablet by mouth once daily for 3 days, then increase to one 0.5 mg tablet twice daily for 4 days, then increase to one 1 mg tablet twice daily.   No facility-administered encounter medications on file as of 03/05/2019.    Surgical History: Past Surgical History:  Procedure Laterality Date  . ABDOMINAL HYSTERECTOMY    . BACK SURGERY    . BREAST BIOPSY Right 12/25/2017   affirm bx , x clip,TUBULAR  CARCINOMA  . 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 BREAST LUMPECTOMY WITH NEEDLE LOCALIZATION Right 03/03/2018   Procedure: BREAST LUMPECTOMY WITH NEEDLE LOCALIZATION;  Surgeon: Vickie Epley, MD;  Location: [facility] ORS;  Service: General;  Laterality: Right;  . SENTINEL NODE BIOPSY Right 03/21/2018   Procedure: RIGHT SENTINEL LYMPH  NODE BIOPSY;  Surgeon: Vickie Epley, MD;  Location: [facility] ORS;  Service: General;  Laterality: Right;    Medical History: Past Medical History:  Diagnosis Date  . Allergy   . Arthritis   . Breast cancer ([place]) 12/2017   right breast  . COPD (chronic obstructive pulmonary disease) ([place])   . Depression   . Personal history of radiation therapy   . Sleep apnea     Family History: Family History  Problem Relation Age of Onset  . Breast cancer Mother 85  . Lung cancer Mother   . Breast cancer Maternal Grandmother   . Thyroid cancer Father     Social  History   Socioeconomic History  . Marital status: Single    Spouse name: Not on file  . Number of children: 2  . Years of education: Not on file  . Highest education level: Not on file  Occupational History  . Not on file  Tobacco Use  . Smoking status: Current Every Day Smoker    Packs/day: 1.00    Years: 30.00    Pack years: 30.00    Types: Cigarettes  . Smokeless tobacco: Never Used  Substance and Sexual Activity  . Alcohol use: No  . Drug use: Not Currently    Types: Marijuana    Comment: past  . Sexual activity: Not Currently  Other Topics Concern  . Not on file  Social History Narrative  . Not on file   Social Determinants of Health   Financial Resource Strain:   . Difficulty of Paying Living Expenses: Not on file  Food Insecurity:   . Worried About Charity fundraiser in the Last Year: Not on file  . Ran Out of Food in the Last Year: Not on file  Transportation Needs:   . Lack of Transportation (Medical): Not on file  . Lack of Transportation (Non-Medical): Not on file  Physical Activity:   . Days of Exercise per Week: Not on file  . Minutes of Exercise per Session: Not on file  Stress:   . Feeling of Stress : Not on file  Social Connections:   . Frequency of Communication with Friends and Family: Not on file  . Frequency of Social Gatherings with Friends and Family: Not on file  . Attends Religious Services: Not on file  . Active Member of Clubs or Organizations: Not on file  . Attends Archivist Meetings: Not on file  . Marital Status: Not on file  Intimate Partner Violence:   . Fear of Current or Ex-Partner: Not on file  . Emotionally Abused: Not on file  . Physically Abused: Not on file  . Sexually Abused: Not on file      Review of Systems  Constitutional: Negative for chills, fatigue and unexpected weight change.  HENT: Negative for congestion, rhinorrhea, sneezing and sore throat.   Eyes: Negative for photophobia, pain and  redness.  Respiratory: Negative for cough, chest tightness and shortness of breath.   Cardiovascular: Negative for chest pain and palpitations.  Gastrointestinal: Negative for abdominal pain, constipation, diarrhea, nausea and vomiting.  Endocrine: Negative.   Genitourinary: Negative for dysuria and frequency.  Musculoskeletal: Negative for arthralgias, back pain, joint swelling and neck pain.  Skin: Negative for rash.  Allergic/Immunologic: Negative.   Neurological: Negative for tremors and numbness.  Hematological: Negative for adenopathy. Does not bruise/bleed easily.  Psychiatric/Behavioral: Negative for behavioral problems and sleep disturbance. The patient is not nervous/anxious.     Vital Signs: Ht 5\' 2"  (1.575 m)   Wt 210 lb (95.3 kg)   BMI 38.41 kg/m    Observation/Objective:  Well appearing, NAD  noted.    Assessment/Plan: 1. DDD (degenerative disc disease), lumbar X rays reviewed with patient.  Ongoing pain present. Will get patient referral for spine surgeon, to talk about options for treatment, or pain management. - Ambulatory referral to Neurosurgery  2. Impaired fasting blood sugar Pt also has neuropathy type pain in feet, her fasting blood sugars have been elevated, will get a1c with labs. - Hemoglobin A1c  3. Other fatigue Fatigue and neuropathy, will check labs and follow up in one week.  - Vitamin D (25 hydroxy) - B12 and Folate Panel - Fe+TIBC+Fer  4. Screen for colon cancer Discussed importance of colonoscopy, referral placed.  - Ambulatory referral to Gastroenterology  Get routine labs, for physical in one week. - CBC with Differential/Platelet - Lipid Panel With LDL/HDL Ratio - TSH - T4, free - Comprehensive metabolic panel  General Counseling: Rodas verbalizes understanding of the findings of today's phone visit and agrees with plan of treatment. I have discussed any further diagnostic evaluation that may be needed or ordered today. We also  reviewed her medications today. she has been encouraged to call the office with any questions or concerns that should arise related to todays visit.    Orders Placed This Encounter  Procedures  . CBC with Differential/Platelet  . Lipid Panel With LDL/HDL Ratio  . TSH  . T4, free  . Comprehensive metabolic panel  . Hemoglobin A1c  . Vitamin D (25 hydroxy)  . B12 and Folate Panel  . Fe+TIBC+Fer  . Ambulatory referral to Gastroenterology  . Ambulatory referral to Neurosurgery    No orders of the defined types were placed in this encounter.   Time spent: [facility] Internal medicine

## 2019-03-06 ENCOUNTER — Telehealth: Payer: Self-pay

## 2019-03-06 ENCOUNTER — Other Ambulatory Visit: Payer: Self-pay

## 2019-03-06 DIAGNOSIS — Z1211 Encounter for screening for malignant neoplasm of colon: Secondary | ICD-10-CM

## 2019-03-06 MED ORDER — NA SULFATE-K SULFATE-MG SULF 17.5-3.13-1.6 GM/177ML PO SOLN: 354.0000 mL | Freq: Once | ORAL | 0 refills | Status: AC

## 2019-03-06 NOTE — Telephone Encounter (Signed)
CMN SIGNED AND PLACED IN [place] FOLDER. °

## 2019-03-06 NOTE — Telephone Encounter (Signed)
Gastroenterology Pre-Procedure Review    PATIENT REVIEW QUESTIONS: The patient responded to the following health history questions as indicated:    1. Are you having any GI issues? no 2. Do you have a personal history of Polyps? no 3. Do you have a family history of Colon Cancer or Polyps? no 4. Diabetes Mellitus? no 5. Joint replacements in the past 12 months?no 6. Major health problems in the past 3 months?no 7. Any artificial heart valves, MVP, or defibrillator?no    MEDICATIONS & ALLERGIES:    Patient reports the following regarding taking any anticoagulation/antiplatelet therapy:   Plavix, Coumadin, Eliquis, Xarelto, Lovenox, Pradaxa, Brilinta, or Effient? no Aspirin? no  Patient confirms/reports the following medications:  Current Outpatient Medications  Medication Sig Dispense Refill  . acetaminophen (TYLENOL) 500 MG tablet Take 500 mg by mouth every 6 (six) hours as needed.    Marland Kitchen albuterol (VENTOLIN HFA) 108 (90 Base) MCG/ACT inhaler Inhale 2 puffs into the lungs every 6 (six) hours as needed for wheezing or shortness of breath. 18 g 0  . anastrozole (ARIMIDEX) 1 MG tablet TAKE 1 TABLET BY MOUTH ONCE DAILY 30 tablet 3  . budesonide-formoterol (SYMBICORT) 160-4.5 MCG/ACT inhaler Inhale 2 puffs into the lungs 2 (two) times daily. 1 Inhaler 3  . Calcium 600-400 MG-UNIT CHEW Chew 1 Dose by mouth 2 (two) times daily.    . celecoxib (CELEBREX) 100 MG capsule Take 1 capsule (100 mg total) by mouth 2 (two) times daily. 60 capsule 1  . montelukast (SINGULAIR) 10 MG tablet Take 1 tablet (10 mg total) by mouth at bedtime. 30 tablet 3  . Multiple Vitamins-Minerals (ALIVE WOMENS ENERGY PO) Take 1 Dose by mouth daily.    . sertraline (ZOLOFT) 50 MG tablet TAKE 1 TABLET BY MOUTH ONCE DAILY 30 tablet 3  . varenicline (CHANTIX STARTING MONTH PAK) 0.5 MG X 11 & 1 MG X 42 tablet Take one 0.5 mg tablet by mouth once daily for 3 days, then increase to one 0.5 mg tablet twice daily for 4 days, then  increase to one 1 mg tablet twice daily. 53 tablet 0   No current facility-administered medications for this visit.    Patient confirms/reports the following allergies:  Allergies  Allergen Reactions  . Penicillins Rash and Other (See Comments)    DID THE REACTION INVOLVE: Swelling of the face/tongue/throat, SOB, or low BP? No Sudden or severe rash/hives, skin peeling, or the inside of the mouth or nose? Yes, blisters. Not immediate. Did it require medical treatment? Yes When did it last happen? 40s If all above answers are "NO", may proceed with cephalosporin use.     No orders of the defined types were placed in this encounter.   AUTHORIZATION INFORMATION Primary Insurance: 1D#: Group #:  Secondary Insurance: 1D#: Group #:  SCHEDULE INFORMATION: Date: 03/17/2019 Time: Location: [facility]

## 2019-03-07 LAB — COMPREHENSIVE METABOLIC PANEL
ALT: 23 IU/L (ref 0–32)
AST: 18 IU/L (ref 0–40)
Albumin/Globulin Ratio: 1.7 (ref 1.2–2.2)
Albumin: 4.3 g/dL (ref 3.8–4.9)
Alkaline Phosphatase: 79 IU/L (ref 39–117)
BUN/Creatinine Ratio: 20 (ref 9–23)
BUN: 16 mg/dL (ref 6–24)
Bilirubin Total: 0.2 mg/dL (ref 0.0–1.2)
CO2: 24 mmol/L (ref 20–29)
Calcium: 9 mg/dL (ref 8.7–10.2)
Chloride: 104 mmol/L (ref 96–106)
Creatinine, Ser: 0.82 mg/dL (ref 0.57–1.00)
GFR calc Af Amer: 95 mL/min/{1.73_m2} (ref >59)
GFR calc non Af Amer: 83 mL/min/{1.73_m2} (ref >59)
Globulin, Total: 2.5 g/dL (ref 1.5–4.5)
Glucose: 104 mg/dL — ABNORMAL HIGH (ref 65–99)
Potassium: 4.6 mmol/L (ref 3.5–5.2)
Sodium: 140 mmol/L (ref 134–144)
Total Protein: 6.8 g/dL (ref 6.0–8.5)

## 2019-03-07 LAB — IRON,TIBC AND FERRITIN PANEL
Ferritin: 118 ng/mL (ref 15–150)
Iron Saturation: 19 % (ref 15–55)
Iron: 61 ug/dL (ref 27–159)
Total Iron Binding Capacity: 328 ug/dL (ref 250–450)
UIBC: 267 ug/dL (ref 131–425)

## 2019-03-07 LAB — CBC WITH DIFFERENTIAL/PLATELET
Basophils Absolute: 0.1 10*3/uL (ref 0.0–0.2)
Basos: 1 %
EOS (ABSOLUTE): 0.2 10*3/uL (ref 0.0–0.4)
Eos: 2 %
Hematocrit: 40.9 % (ref 34.0–46.6)
Hemoglobin: 13.7 g/dL (ref 11.1–15.9)
Immature Grans (Abs): 0 10*3/uL (ref 0.0–0.1)
Immature Granulocytes: 0 %
Lymphocytes Absolute: 2 10*3/uL (ref 0.7–3.1)
Lymphs: 22 %
MCH: 31.1 pg (ref 26.6–33.0)
MCHC: 33.5 g/dL (ref 31.5–35.7)
MCV: 93 fL (ref 79–97)
Monocytes Absolute: 0.6 10*3/uL (ref 0.1–0.9)
Monocytes: 7 %
Neutrophils Absolute: 6.2 10*3/uL (ref 1.4–7.0)
Neutrophils: 68 %
Platelets: 320 10*3/uL (ref 150–450)
RBC: 4.4 x10E6/uL (ref 3.77–5.28)
RDW: 13 % (ref 11.7–15.4)
WBC: 9.1 10*3/uL (ref 3.4–10.8)

## 2019-03-07 LAB — LIPID PANEL WITH LDL/HDL RATIO
Cholesterol, Total: 205 mg/dL — ABNORMAL HIGH (ref 100–199)
HDL: 62 mg/dL (ref >39)
LDL Chol Calc (NIH): 115 mg/dL — ABNORMAL HIGH (ref 0–99)
LDL/HDL Ratio: 1.9 ratio (ref 0.0–3.2)
Triglycerides: 159 mg/dL — ABNORMAL HIGH (ref 0–149)
VLDL Cholesterol Cal: 28 mg/dL (ref 5–40)

## 2019-03-07 LAB — TSH: TSH: 0.525 u[IU]/mL (ref 0.450–4.500)

## 2019-03-07 LAB — HEMOGLOBIN A1C
Est. average glucose Bld gHb Est-mCnc: 120 mg/dL
Hgb A1c MFr Bld: 5.8 % — ABNORMAL HIGH (ref 4.8–5.6)

## 2019-03-07 LAB — VITAMIN D 25 HYDROXY (VIT D DEFICIENCY, FRACTURES): Vit D, 25-Hydroxy: 20.3 ng/mL — ABNORMAL LOW (ref 30.0–100.0)

## 2019-03-07 LAB — B12 AND FOLATE PANEL
Folate: 8.8 ng/mL (ref >3.0)
Vitamin B-12: 347 pg/mL (ref 232–1245)

## 2019-03-07 LAB — T4, FREE: Free T4: 1.1 ng/dL (ref 0.82–1.77)

## 2019-03-10 ENCOUNTER — Telehealth: Payer: Self-pay

## 2019-03-10 NOTE — Telephone Encounter (Signed)
Confirmed appointment with patient and screened for covid. klh 

## 2019-03-12 ENCOUNTER — Other Ambulatory Visit: Payer: Self-pay

## 2019-03-12 ENCOUNTER — Encounter: Payer: Self-pay | Admitting: Adult Health

## 2019-03-12 ENCOUNTER — Telehealth: Payer: Self-pay

## 2019-03-12 ENCOUNTER — Ambulatory Visit (INDEPENDENT_AMBULATORY_CARE_PROVIDER_SITE_OTHER): Payer: Medicaid Other | Admitting: Adult Health

## 2019-03-12 VITALS — BP 132/71 | HR 81 | Temp 97.5°F | Resp 16 | Ht 62.0 in | Wt 208.8 lb

## 2019-03-12 DIAGNOSIS — J449 Chronic obstructive pulmonary disease, unspecified: Secondary | ICD-10-CM

## 2019-03-12 DIAGNOSIS — Z9109 Other allergy status, other than to drugs and biological substances: Secondary | ICD-10-CM

## 2019-03-12 DIAGNOSIS — F17200 Nicotine dependence, unspecified, uncomplicated: Secondary | ICD-10-CM | POA: Diagnosis not present

## 2019-03-12 DIAGNOSIS — Z0001 Encounter for general adult medical examination with abnormal findings: Secondary | ICD-10-CM

## 2019-03-12 DIAGNOSIS — Z6838 Body mass index (BMI) 38.0-38.9, adult: Secondary | ICD-10-CM | POA: Diagnosis not present

## 2019-03-12 DIAGNOSIS — R7303 Prediabetes: Secondary | ICD-10-CM | POA: Diagnosis not present

## 2019-03-12 DIAGNOSIS — R300 Dysuria: Secondary | ICD-10-CM

## 2019-03-12 DIAGNOSIS — F411 Generalized anxiety disorder: Secondary | ICD-10-CM

## 2019-03-12 DIAGNOSIS — M1990 Unspecified osteoarthritis, unspecified site: Secondary | ICD-10-CM

## 2019-03-12 DIAGNOSIS — M5136 Other intervertebral disc degeneration, lumbar region: Secondary | ICD-10-CM

## 2019-03-12 DIAGNOSIS — C50911 Malignant neoplasm of unspecified site of right female breast: Secondary | ICD-10-CM

## 2019-03-12 MED ORDER — VARENICLINE TARTRATE 1 MG PO TABS: 1.0000 mg | ORAL_TABLET | Freq: Two times a day (BID) | ORAL | 1 refills | Status: DC

## 2019-03-12 MED ORDER — CELECOXIB 100 MG PO CAPS: 100.0000 mg | ORAL_CAPSULE | Freq: Two times a day (BID) | ORAL | 3 refills | Status: DC

## 2019-03-12 MED ORDER — ALPRAZOLAM 0.25 MG PO TABS: 0.2500 mg | ORAL_TABLET | Freq: Two times a day (BID) | ORAL | 0 refills | Status: DC | PRN

## 2019-03-12 NOTE — Progress Notes (Signed)
[facility] [place], [place] [address]  Internal MEDICINE  Office Visit Note  Patient Name: Margaret Blackburn  [id number]  [id number]  Date of Service: 03/12/2019  Chief Complaint  Patient presents with  . Medicare Wellness  . Arthritis  . Depression  . Allergies  . COPD  . Medication Refill    chantix  . Anxiety    when stressed heart rate increases     HPI Pt is here for routine health maintenance examination.  Pt is a well appearing 54 yo female. She has a history of Breast Cancer, anxiety, copd, depression, arthritis, and allergies. She is currently under evaluation for her breast cancer. Her anxiety is currently controlled however she does report intermittent tachycardia with stress.  Her copd is currently controlled with Symbicort. Her arthritis and allergies are also well controlled.      Current Medication: Outpatient Encounter Medications as of 03/12/2019  Medication Sig  . acetaminophen (TYLENOL) 500 MG tablet Take 500 mg by mouth every 6 (six) hours as needed.  Marland Kitchen albuterol (VENTOLIN HFA) 108 (90 Base) MCG/ACT inhaler Inhale 2 puffs into the lungs every 6 (six) hours as needed for wheezing or shortness of breath.  . anastrozole (ARIMIDEX) 1 MG tablet TAKE 1 TABLET BY MOUTH ONCE DAILY  . budesonide-formoterol (SYMBICORT) 160-4.5 MCG/ACT inhaler Inhale 2 puffs into the lungs 2 (two) times daily.  . Calcium 600-400 MG-UNIT CHEW Chew 1 Dose by mouth 2 (two) times daily.  . celecoxib (CELEBREX) 100 MG capsule Take 1 capsule (100 mg total) by mouth 2 (two) times daily.  . montelukast (SINGULAIR) 10 MG tablet Take 1 tablet (10 mg total) by mouth at bedtime.  . Multiple Vitamins-Minerals (ALIVE WOMENS ENERGY PO) Take 1 Dose by mouth daily.  . sertraline (ZOLOFT) 50 MG tablet TAKE 1 TABLET BY MOUTH ONCE DAILY  . varenicline (CHANTIX STARTING MONTH PAK) 0.5 MG X 11 & 1 MG X 42 tablet Take one 0.5 mg tablet by mouth once daily for 3 days, then increase to  one 0.5 mg tablet twice daily for 4 days, then increase to one 1 mg tablet twice daily.   No facility-administered encounter medications on file as of 03/12/2019.    Surgical History: Past Surgical History:  Procedure Laterality Date  . ABDOMINAL HYSTERECTOMY    . BACK SURGERY    . BREAST BIOPSY Right 12/25/2017   affirm bx , x clip,TUBULAR  CARCINOMA  . 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 BREAST LUMPECTOMY WITH NEEDLE LOCALIZATION Right 03/03/2018   Procedure: BREAST LUMPECTOMY WITH NEEDLE LOCALIZATION;  Surgeon: Vickie Epley, MD;  Location: [facility] ORS;  Service: General;  Laterality: Right;  . SENTINEL NODE BIOPSY Right 03/21/2018   Procedure: RIGHT SENTINEL LYMPH  NODE BIOPSY;  Surgeon: Vickie Epley, MD;  Location: [facility] ORS;  Service: General;  Laterality: Right;    Medical History: Past Medical History:  Diagnosis Date  . Allergy   . Arthritis   . Breast cancer ([place]) 12/2017   right breast  . COPD (chronic obstructive pulmonary disease) ([place])   . Depression   . Personal history of radiation therapy   . Sleep apnea     Family History: Family History  Problem Relation Age of Onset  . Breast cancer Mother 8  . Lung cancer Mother   . Breast cancer Maternal Grandmother   . Thyroid cancer Father       Review of Systems   Vital Signs:  BP 132/71   Pulse 81   Temp (!) 97.5 F (36.4 C)   Resp 16   Ht 5\' 2"  (1.575 m)   Wt 208 lb 12.8 oz (94.7 kg)   SpO2 99%   BMI 38.19 kg/m    Physical Exam   LABS: Recent Results (from the past 2160 hour(s))  CBC with Differential/Platelet     Status: None   Collection Time: 03/06/19  8:17 AM  Result Value Ref Range   WBC 9.1 3.4 - 10.8 x10E3/uL   RBC 4.40 3.77 - 5.28 x10E6/uL   Hemoglobin 13.7 11.1 - 15.9 g/dL   Hematocrit 40.9 34.0 - 46.6 %   MCV 93 79 - 97 fL   MCH 31.1 26.6 - 33.0 pg   MCHC 33.5 31.5 - 35.7 g/dL   RDW 13.0 11.7 - 15.4 %   Platelets 320 150 - 450 x10E3/uL    Neutrophils 68 Not Estab. %   Lymphs 22 Not Estab. %   Monocytes 7 Not Estab. %   Eos 2 Not Estab. %   Basos 1 Not Estab. %   Neutrophils Absolute 6.2 1.4 - 7.0 x10E3/uL   Lymphocytes Absolute 2.0 0.7 - 3.1 x10E3/uL   Monocytes Absolute 0.6 0.1 - 0.9 x10E3/uL   EOS (ABSOLUTE) 0.2 0.0 - 0.4 x10E3/uL   Basophils Absolute 0.1 0.0 - 0.2 x10E3/uL   Immature Granulocytes 0 Not Estab. %   Immature Grans (Abs) 0.0 0.0 - 0.1 x10E3/uL  Lipid Panel With LDL/HDL Ratio     Status: Abnormal   Collection Time: 03/06/19  8:17 AM  Result Value Ref Range   Cholesterol, Total 205 (H) 100 - 199 mg/dL   Triglycerides 159 (H) 0 - 149 mg/dL   HDL 62 >39 mg/dL   VLDL Cholesterol Cal 28 5 - 40 mg/dL   LDL Chol Calc (NIH) 115 (H) 0 - 99 mg/dL   LDL/HDL Ratio 1.9 0.0 - 3.2 ratio    Comment:                                     LDL/HDL Ratio                                             Men  Women                               1/2 Avg.Risk  1.0    1.5                                   Avg.Risk  3.6    3.2                                2X Avg.Risk  6.2    5.0                                3X Avg.Risk  8.0    6.1   TSH     Status: None   Collection Time: 03/06/19  8:17 AM  Result Value Ref  Range   TSH 0.525 0.450 - 4.500 uIU/mL  T4, free     Status: None   Collection Time: 03/06/19  8:17 AM  Result Value Ref Range   Free T4 1.10 0.82 - 1.77 ng/dL  Comprehensive metabolic panel     Status: Abnormal   Collection Time: 03/06/19  8:17 AM  Result Value Ref Range   Glucose 104 (H) 65 - 99 mg/dL   BUN 16 6 - 24 mg/dL   Creatinine, Ser 0.82 0.57 - 1.00 mg/dL   GFR calc non Af Amer 83 >59 mL/min/1.73   GFR calc Af Amer 95 >59 mL/min/1.73   BUN/Creatinine Ratio 20 9 - 23   Sodium 140 134 - 144 mmol/L   Potassium 4.6 3.5 - 5.2 mmol/L   Chloride 104 96 - 106 mmol/L   CO2 24 20 - 29 mmol/L   Calcium 9.0 8.7 - 10.2 mg/dL   Total Protein 6.8 6.0 - 8.5 g/dL   Albumin 4.3 3.8 - 4.9 g/dL   Globulin, Total 2.5 1.5  - 4.5 g/dL   Albumin/Globulin Ratio 1.7 1.2 - 2.2   Bilirubin Total 0.2 0.0 - 1.2 mg/dL   Alkaline Phosphatase 79 39 - 117 IU/L   AST 18 0 - 40 IU/L   ALT 23 0 - 32 IU/L  Hemoglobin A1c     Status: Abnormal   Collection Time: 03/06/19  8:17 AM  Result Value Ref Range   Hgb A1c MFr Bld 5.8 (H) 4.8 - 5.6 %    Comment:          Prediabetes: 5.7 - 6.4          Diabetes: >6.4          Glycemic control for adults with diabetes: <7.0    Est. average glucose Bld gHb Est-mCnc 120 mg/dL  Vitamin D (25 hydroxy)     Status: Abnormal   Collection Time: 03/06/19  8:17 AM  Result Value Ref Range   Vit D, 25-Hydroxy 20.3 (L) 30.0 - 100.0 ng/mL    Comment: Vitamin D deficiency has been defined by the Institute of Medicine and an Endocrine Society practice guideline as a level of serum 25-OH vitamin D less than 20 ng/mL (1,2). The Endocrine Society went on to further define vitamin D insufficiency as a level between 21 and 29 ng/mL (2). 1. IOM (Institute of Medicine). 2010. Dietary reference    intakes for calcium and D. [place]: The    [employer]. 2. Holick MF, Binkley [place], Bischoff-Ferrari HA, et al.    Evaluation, treatment, and prevention of vitamin D    deficiency: an Endocrine Society clinical practice    guideline. JCEM. 2011 Jul; 96(7):1911-30.   B12 and Folate Panel     Status: None   Collection Time: 03/06/19  8:17 AM  Result Value Ref Range   Vitamin B-12 347 232 - 1,245 pg/mL   Folate 8.8 >3.0 ng/mL    Comment: A serum folate concentration of less than 3.1 ng/mL is considered to represent clinical deficiency.   Fe+TIBC+Fer     Status: None   Collection Time: 03/06/19  8:17 AM  Result Value Ref Range   Total Iron Binding Capacity 328 250 - 450 ug/dL   UIBC 267 131 - 425 ug/dL   Iron 61 27 - 159 ug/dL   Iron Saturation 19 15 - 55 %   Ferritin 118 15 - 150 ng/mL     Assessment/Plan: 1. Encounter for general adult medical  examination with abnormal  findings Labs up to date, discussed colonoscopy, patient will decided and let us know.   2. Prediabetes Discussed lifestyle modifications for pre-DM. A1C is 5.8, we specifically talked about dietary modifications and exercise, as well as the benefits of weight loss.     3. Chronic obstructive pulmonary disease, unspecified COPD type ([place]) Stable, continue to use Symbicort as directed.   4. Environmental allergies Continue otc medications as discussed  5. Nicotine dependence with current use Smoking cessation counseling: 1. Pt acknowledges the risks of long term smoking, she will try to quite smoking. 2. Options for different medications including nicotine products, chewing gum, patch etc, Wellbutrin and Chantix is discussed 3. Goal and date of compete cessation is discussed 4. Total time spent in smoking cessation is 15 min. - varenicline (CHANTIX CONTINUING MONTH PAK) 1 MG tablet; Take 1 tablet (1 mg total) by mouth 2 (two) times daily.  Dispense: 60 tablet; Refill: 1  6. GAD (generalized anxiety disorder) Pt has good results with zoloft, however she does have the occasional overwhelming days where she would benefit from something for breakthrough anxiety. Use xanax as discussed.  - ALPRAZolam (XANAX) 0.25 MG tablet; Take 1 tablet (0.25 mg total) by mouth 2 (two) times daily as needed for anxiety.  Dispense: 30 tablet; Refill: 0  7. Malignant neoplasm of right breast greater than or equal to 2 cm in greatest dimension [facility]) Continue present treatment/evaluation per oncology.  8. DDD (degenerative disc disease), lumbar Stable, continue to monitor.  9. BMI 38.0-38.9,adult Obesity Counseling: Risk Assessment: An assessment of behavioral risk factors was made today and includes lack of exercise sedentary lifestyle, lack of portion control and poor dietary habits.  Risk Modification Advice: She was counseled on portion control guidelines. Restricting daily caloric intake to 1800. The  detrimental long term effects of obesity on her health and ongoing poor compliance was also discussed with the patient.  10. Dysuria - UA/M w/rflx Culture, Routine  11. Arthritis Continue celebrex as prescribed. Good results currently.   - celecoxib (CELEBREX) 100 MG capsule; Take 1 capsule (100 mg total) by mouth 2 (two) times daily.  Dispense: 60 capsule; Refill: 3  General Counseling: Karolyna verbalizes understanding of the findings of todays visit and agrees with plan of treatment. I have discussed any further diagnostic evaluation that may be needed or ordered today. We also reviewed her medications today. she has been encouraged to call the office with any questions or concerns that should arise related to todays visit.   Orders Placed This Encounter  Procedures  . UA/M w/rflx Culture, Routine    No orders of the defined types were placed in this encounter.   Time spent 30 Minutes   This patient was seen by Orson Gear AGNP-C in Collaboration with Dr Lavera Guise as a part of collaborative care agreement    Kendell Bane AGNP-C Internal Medicine

## 2019-03-12 NOTE — Telephone Encounter (Signed)
Confirmed appointment on 03/16/2019 and screened for covid. klh 

## 2019-03-13 ENCOUNTER — Other Ambulatory Visit: Payer: Self-pay | Admitting: Student

## 2019-03-13 ENCOUNTER — Encounter: Payer: Self-pay | Admitting: Gastroenterology

## 2019-03-13 ENCOUNTER — Encounter: Payer: Self-pay | Admitting: Anesthesiology

## 2019-03-13 ENCOUNTER — Other Ambulatory Visit
Admission: RE | Admit: 2019-03-13 | Discharge: 2019-03-13 | Disposition: A | Discharge status: Home / Self Care | Payer: Medicaid Other | Source: Ambulatory Visit | Attending: Gastroenterology | Admitting: Gastroenterology

## 2019-03-13 DIAGNOSIS — Z01812 Encounter for preprocedural laboratory examination: Secondary | ICD-10-CM | POA: Insufficient documentation

## 2019-03-13 DIAGNOSIS — M5442 Lumbago with sciatica, left side: Secondary | ICD-10-CM

## 2019-03-13 DIAGNOSIS — M545 Low back pain, unspecified: Secondary | ICD-10-CM

## 2019-03-13 DIAGNOSIS — G8929 Other chronic pain: Secondary | ICD-10-CM

## 2019-03-13 DIAGNOSIS — Z20822 Contact with and (suspected) exposure to covid-19: Secondary | ICD-10-CM | POA: Insufficient documentation

## 2019-03-13 DIAGNOSIS — M5136 Other intervertebral disc degeneration, lumbar region: Secondary | ICD-10-CM

## 2019-03-13 LAB — UA/M W/RFLX CULTURE, ROUTINE
Bilirubin, UA: NEGATIVE
Glucose, UA: NEGATIVE
Ketones, UA: NEGATIVE
Leukocytes,UA: NEGATIVE
Nitrite, UA: NEGATIVE
Protein,UA: NEGATIVE
RBC, UA: NEGATIVE
Specific Gravity, UA: 1.021 (ref 1.005–1.030)
Urobilinogen, Ur: 0.2 mg/dL (ref 0.2–1.0)
pH, UA: 5.5 (ref 5.0–7.5)

## 2019-03-13 LAB — MICROSCOPIC EXAMINATION

## 2019-03-13 LAB — SARS CORONAVIRUS 2 (TAT 6-24 HRS): SARS Coronavirus 2: NEGATIVE

## 2019-03-16 ENCOUNTER — Ambulatory Visit: Payer: Medicaid Other | Admitting: Internal Medicine

## 2019-03-16 ENCOUNTER — Other Ambulatory Visit: Payer: Self-pay

## 2019-03-16 VITALS — BP 132/71 | HR 86 | Temp 97.4°F | Resp 16 | Ht 62.0 in | Wt 206.0 lb

## 2019-03-16 DIAGNOSIS — J3089 Other allergic rhinitis: Secondary | ICD-10-CM

## 2019-03-16 DIAGNOSIS — J301 Allergic rhinitis due to pollen: Secondary | ICD-10-CM | POA: Diagnosis not present

## 2019-03-16 NOTE — Progress Notes (Signed)
Pt was here today for allergy test

## 2019-03-16 NOTE — Procedures (Signed)
    [facility] Allergy MQT Recording Form  [place] [address] [place], [place] [address] Phone [phone] Fax [phone]   Patient Name: Margaret Blackburn Age: 54 y.o. Sex: female Date of Service: @SERVICEDATE @2 /8/21   Performing Provider: Allyne Gee MD [facility]         Battery A Back   Site Antigen [facility] Flare  A1 Positive Control 10 35  A2 Negative Control 5 5  A3 American Elm 0 0  A4 Maple Box Elder 0 0  A5 Grass Mix 0 0  A6 Dock Sorrel Mix 0 0  A7 Russian Thistle 0 0  A8 Ragweed Mix 0 0  A9 English Pantain 0 0  A10 Oak Mix  0 0   Battery B Wheal Flare  B1 Lambs Quarters 0  0  B2 Cottonwood 0 0  B3 Pigweed Mix 9 11  B4 Acacia 0 0  B5 Pine Mix 0 0  B6 Privet 0 0  B7 White/Red Mulberry 0 0  B8 Western Water Hemp 0 0  B9 Guatemala Grass 7 8  B10 Melalucea 8 11   Battery C Wheal Flare  C1 [place] River Birch 0 0  C2 Eastern Sycamore 0 0  C3 Bahai Grass 0 0  C4 American Beech 0 0  C5 Ash Mix 0 0  C6 Black Willow 0 0  C7 Hickory 0 0  C8 Black Walnut 0 0  C9 Red Cedar 0 0  C10 Sweet Gum  0 0   Battery D Wheal Flare  D1 Cultivated Oat 7 9  D2 Dog Fennel 0 0  D3 Common Mugwort 0 0  D4 Marsh Elder 0 0  D5 Johnson 0 0  D6 Hackberry Tree 7 9  D7 Bayberry Tree 0 0  D8 Cypress, Bald Tree 0 0  D9 Aspergillus Fumigatus 0 0  D10 Alternia  0 0   Battery E Wheal Flare  E1 Dreschlere 0 0  E2 Fusarium Mix 0 0  E3 Cladosporum Sph 0 0  E4 Bipolaris 6 7  E5 Penicillin chrys 0 0  E6 Cladosporum Herb 0 0  E7 Candida 0 0  E8 Aureobasidium 0 0  E9 Rhizopus 0 0  E10 Botrytis  0 0   Battery F Wheal Flare  F1 Aspergillus [place] 6 6  F2 Dust Mite Mix 20 45  F3 Cockroach Mix 6 7  F4 Cat Hair 0 0  F5 Dog Mixed breeds 0 0  F6 Feather Mix 0 0

## 2019-03-16 NOTE — Discharge Instructions (Signed)
General Anesthesia, Adult, Care After This sheet gives you information about how to care for yourself after your procedure. Your health care provider may also give you more specific instructions. If you have problems or questions, contact your health care provider. What can I expect after the procedure? After the procedure, the following side effects are common:  Pain or discomfort at the IV site.  Nausea.  Vomiting.  Sore throat.  Trouble concentrating.  Feeling cold or chills.  Weak or tired.  Sleepiness and fatigue.  Soreness and body aches. These side effects can affect parts of the body that were not involved in surgery. Follow these instructions at home:  For at least 24 hours after the procedure:  Have a responsible adult stay with you. It is important to have someone help care for you until you are awake and alert.  Rest as needed.  Do not: ? Participate in activities in which you could fall or become injured. ? Drive. ? Use heavy machinery. ? Drink alcohol. ? Take sleeping pills or medicines that cause drowsiness. ? Make important decisions or sign legal documents. ? Take care of children on your own. Eating and drinking  Follow any instructions from your health care provider about eating or drinking restrictions.  When you feel hungry, start by eating small amounts of foods that are soft and easy to digest (bland), such as toast. Gradually return to your regular diet.  Drink enough fluid to keep your urine pale yellow.  If you vomit, rehydrate by drinking water, juice, or clear broth. General instructions  If you have sleep apnea, surgery and certain medicines can increase your risk for breathing problems. Follow instructions from your health care provider about wearing your sleep device: ? Anytime you are sleeping, including during daytime naps. ? While taking prescription pain medicines, sleeping medicines, or medicines that make you drowsy.  Return to  your normal activities as told by your health care provider. Ask your health care provider what activities are safe for you.  Take over-the-counter and prescription medicines only as told by your health care provider.  If you smoke, do not smoke without supervision.  Keep all follow-up visits as told by your health care provider. This is important. Contact a health care provider if:  You have nausea or vomiting that does not get better with medicine.  You cannot eat or drink without vomiting.  You have pain that does not get better with medicine.  You are unable to pass urine.  You develop a skin rash.  You have a fever.  You have redness around your IV site that gets worse. Get help right away if:  You have difficulty breathing.  You have chest pain.  You have blood in your urine or stool, or you vomit blood. Summary  After the procedure, it is common to have a sore throat or nausea. It is also common to feel tired.  Have a responsible adult stay with you for the first 24 hours after general anesthesia. It is important to have someone help care for you until you are awake and alert.  When you feel hungry, start by eating small amounts of foods that are soft and easy to digest (bland), such as toast. Gradually return to your regular diet.  Drink enough fluid to keep your urine pale yellow.  Return to your normal activities as told by your health care provider. Ask your health care provider what activities are safe for you. This information is not   intended to replace advice given to you by your health care provider. Make sure you discuss any questions you have with your health care provider. Document Revised: 01/25/2017 Document Reviewed: 09/07/2016 Elsevier Patient Education  2020 Elsevier Inc.  

## 2019-03-17 ENCOUNTER — Telehealth: Payer: Self-pay

## 2019-03-17 ENCOUNTER — Other Ambulatory Visit: Payer: Self-pay

## 2019-03-17 ENCOUNTER — Encounter: Admission: RE | Payer: Self-pay | Source: Home / Self Care

## 2019-03-17 ENCOUNTER — Ambulatory Visit: Admission: RE | Admit: 2019-03-17 | Payer: Medicaid Other | Source: Home / Self Care | Admitting: Gastroenterology

## 2019-03-17 DIAGNOSIS — Z1211 Encounter for screening for malignant neoplasm of colon: Secondary | ICD-10-CM

## 2019-03-17 HISTORY — DX: Prediabetes: R73.03

## 2019-03-17 SURGERY — COLONOSCOPY WITH PROPOFOL
Anesthesia: General

## 2019-03-17 SURGERY — COLONOSCOPY WITH PROPOFOL
Anesthesia: Choice

## 2019-03-17 NOTE — Telephone Encounter (Signed)
Colonoscopy has been rescheduled to Monday 04/06/19 with Dr. Vicente Males at [facility].  Patient has been advised to have her COVID test on 04/02/19 at [place] on [address] between 10:30am and 12:30pm.  Thanks [employer]

## 2019-03-18 ENCOUNTER — Telehealth: Payer: Self-pay

## 2019-03-18 ENCOUNTER — Other Ambulatory Visit: Payer: Self-pay

## 2019-03-18 DIAGNOSIS — Z9109 Other allergy status, other than to drugs and biological substances: Secondary | ICD-10-CM

## 2019-03-18 MED ORDER — MONTELUKAST SODIUM 10 MG PO TABS: 10.0000 mg | ORAL_TABLET | Freq: Every day | ORAL | 3 refills | Status: DC

## 2019-03-18 NOTE — Telephone Encounter (Signed)
PT CALLED TO STATE THAT SHE WOULD NOT LIKE TO GET ALLERGY INJECTIONS AND PREFERS JUST TO CONTINUE ON HER SINGULAIR THAT WAS GIVEN TO HER.

## 2019-03-23 ENCOUNTER — Ambulatory Visit: Payer: Medicaid Other | Admitting: Nurse Practitioner

## 2019-03-23 ENCOUNTER — Telehealth: Payer: Self-pay

## 2019-03-23 ENCOUNTER — Other Ambulatory Visit: Payer: Self-pay

## 2019-03-23 ENCOUNTER — Encounter: Payer: Self-pay | Admitting: Nurse Practitioner

## 2019-03-23 VITALS — Temp 99.1°F | Ht 62.0 in | Wt 208.0 lb

## 2019-03-23 DIAGNOSIS — U071 COVID-19: Secondary | ICD-10-CM | POA: Diagnosis not present

## 2019-03-23 DIAGNOSIS — J988 Other specified respiratory disorders: Secondary | ICD-10-CM | POA: Diagnosis not present

## 2019-03-23 DIAGNOSIS — R05 Cough: Secondary | ICD-10-CM | POA: Diagnosis not present

## 2019-03-23 DIAGNOSIS — R059 Cough, unspecified: Secondary | ICD-10-CM

## 2019-03-23 MED ORDER — AZITHROMYCIN 250 MG PO TABS: ORAL_TABLET | ORAL | 0 refills | Status: DC

## 2019-03-23 MED ORDER — BENZONATATE 200 MG PO CAPS: 200.0000 mg | ORAL_CAPSULE | Freq: Three times a day (TID) | ORAL | 0 refills | Status: DC | PRN

## 2019-03-23 NOTE — Telephone Encounter (Signed)
Rescheduled appointment on 03/25/2019 to 04/08/2019 patient is covid positive. klh

## 2019-03-23 NOTE — Progress Notes (Signed)
[facility] [name], [place] [address]  Internal MEDICINE  Telephone Visit  Patient Name: Margaret Blackburn  [id number]  [id number]  Date of Service: 03/27/2019  I connected with the patient at 1:57pm by webcam and verified the patients identity using two identifiers.   I discussed the limitations, risks, security and privacy concerns of performing an evaluation and management service by webcam and the availability of in person appointments. I also discussed with the patient that there may be a patient responsible charge related to the service.  The patient expressed understanding and agrees to proceed.    Chief Complaint  Patient presents with  . Telephone Assessment    covid positive  . Telephone Screen  . Nasal Congestion  . Headache  . Cough    chest congestion   . Fever    havent ran a fever until today and took tylenol, her temp currently is after tylenol  . lost of taste and smell    The patient has been contacted via webcam for follow up visit due to concerns for spread of novel coronavirus. The patient presents for acute visit. She states that she started with symptoms on Saturday. She lost sens e of taste and smell yesterday. She went to walk-in clinic. She had rapid test done and was positive for COVID 19. She has low-grade fever, cough, and some wheezing. She was discharged from urgent care with prednisone. She is to take four tablets daily for the next 5 days.       Current Medication: Outpatient Encounter Medications as of 03/23/2019  Medication Sig  . acetaminophen (TYLENOL) 500 MG tablet Take 500 mg by mouth every 6 (six) hours as needed.  Marland Kitchen albuterol (VENTOLIN HFA) 108 (90 Base) MCG/ACT inhaler Inhale 2 puffs into the lungs every 6 (six) hours as needed for wheezing or shortness of breath.  . ALPRAZolam (XANAX) 0.25 MG tablet Take 1 tablet (0.25 mg total) by mouth 2 (two) times daily as needed for anxiety.  Marland Kitchen anastrozole (ARIMIDEX) 1 MG  tablet TAKE 1 TABLET BY MOUTH ONCE DAILY  . budesonide-formoterol (SYMBICORT) 160-4.5 MCG/ACT inhaler Inhale 2 puffs into the lungs 2 (two) times daily.  . Calcium 600-400 MG-UNIT CHEW Chew 1 Dose by mouth 2 (two) times daily.  . celecoxib (CELEBREX) 100 MG capsule Take 1 capsule (100 mg total) by mouth 2 (two) times daily.  . montelukast (SINGULAIR) 10 MG tablet Take 1 tablet (10 mg total) by mouth at bedtime.  . Multiple Vitamins-Minerals (ALIVE WOMENS ENERGY PO) Take 1 Dose by mouth daily.  . predniSONE (STERAPRED UNI-PAK 21 TAB) 10 MG (21) TBPK tablet Take by mouth daily.  . sertraline (ZOLOFT) 50 MG tablet TAKE 1 TABLET BY MOUTH ONCE DAILY  . varenicline (CHANTIX CONTINUING MONTH PAK) 1 MG tablet Take 1 tablet (1 mg total) by mouth 2 (two) times daily.  Marland Kitchen azithromycin (ZITHROMAX) 250 MG tablet z-pack - take as directed for 5 days for URI caused by COVID 19 infection.  . benzonatate (TESSALON) 200 MG capsule Take 1 capsule (200 mg total) by mouth 3 (three) times daily as needed for cough.   No facility-administered encounter medications on file as of 03/23/2019.    Surgical History: Past Surgical History:  Procedure Laterality Date  . ABDOMINAL HYSTERECTOMY    . BACK SURGERY    . BREAST BIOPSY Right 12/25/2017   affirm bx , x clip,TUBULAR  CARCINOMA  . 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  BREAST LUMPECTOMY WITH NEEDLE LOCALIZATION Right 03/03/2018   Procedure: BREAST LUMPECTOMY WITH NEEDLE LOCALIZATION;  Surgeon: Vickie Epley, MD;  Location: [facility] ORS;  Service: General;  Laterality: Right;  . SENTINEL NODE BIOPSY Right 03/21/2018   Procedure: RIGHT SENTINEL LYMPH  NODE BIOPSY;  Surgeon: Vickie Epley, MD;  Location: [facility] ORS;  Service: General;  Laterality: Right;    Medical History: Past Medical History:  Diagnosis Date  . Allergy   . Arthritis   . Breast cancer ([place]) 12/2017   right breast  . COPD (chronic obstructive pulmonary disease) ([place])   .  Depression   . Personal history of radiation therapy   . Prediabetes   . Sleep apnea    supposed to get CPAP 03/25/19    Family History: Family History  Problem Relation Age of Onset  . Breast cancer Mother 11  . Lung cancer Mother   . Breast cancer Maternal Grandmother   . Thyroid cancer Father     Social History   Socioeconomic History  . Marital status: Single    Spouse name: Not on file  . Number of children: 2  . Years of education: Not on file  . Highest education level: Not on file  Occupational History  . Not on file  Tobacco Use  . Smoking status: Current Every Day Smoker    Packs/day: 1.00    Years: 30.00    Pack years: 30.00    Types: Cigarettes  . Smokeless tobacco: Never Used  . Tobacco comment: in the process of trying to quit-chantix  Substance and Sexual Activity  . Alcohol use: No  . Drug use: Yes    Types: Marijuana    Comment: occ  . Sexual activity: Not Currently  Other Topics Concern  . Not on file  Social History Narrative  . Not on file   Social Determinants of Health   Financial Resource Strain:   . Difficulty of Paying Living Expenses: Not on file  Food Insecurity:   . Worried About Charity fundraiser in the Last Year: Not on file  . Ran Out of Food in the Last Year: Not on file  Transportation Needs:   . Lack of Transportation (Medical): Not on file  . Lack of Transportation (Non-Medical): Not on file  Physical Activity:   . Days of Exercise per Week: Not on file  . Minutes of Exercise per Session: Not on file  Stress:   . Feeling of Stress : Not on file  Social Connections:   . Frequency of Communication with Friends and Family: Not on file  . Frequency of Social Gatherings with Friends and Family: Not on file  . Attends Religious Services: Not on file  . Active Member of Clubs or Organizations: Not on file  . Attends Archivist Meetings: Not on file  . Marital Status: Not on file  Intimate Partner Violence:   .  Fear of Current or Ex-Partner: Not on file  . Emotionally Abused: Not on file  . Physically Abused: Not on file  . Sexually Abused: Not on file      Review of Systems  Constitutional: Positive for activity change, chills, fatigue and fever.  HENT: Positive for congestion, postnasal drip, rhinorrhea and sore throat.   Respiratory: Positive for cough. Negative for wheezing.   Cardiovascular: Negative for chest pain and palpitations.  Gastrointestinal: Positive for nausea.  Musculoskeletal: Positive for arthralgias and myalgias.  Allergic/Immunologic: Positive for environmental allergies.  Neurological: Positive  for headaches.  Hematological: Negative for adenopathy.  Psychiatric/Behavioral: Negative.     Today's Vitals   03/23/19 1347  Temp: 99.1 F (37.3 C)  Weight: 208 lb (94.3 kg)  Height: 5\' 2"  (1.575 m)   Body mass index is 38.04 kg/m.  Observation/Objective:   The patient is alert and oriented. She is pleasant and answers all questions appropriately. Breathing is non-labored. She is in no acute distress at this time. The patient is nasally congested and appears to feel poorly.    Assessment/Plan:  1. Respiratory tract infection due to COVID-19 virus Start z-pack. Take as directed for 5 days. Rest and increase fluids. Take OTC medication as needed and as indicated to improve acute symptoms. discussed self-isolation precautions. Patient voiced understanding.  - azithromycin (ZITHROMAX) 250 MG tablet; z-pack - take as directed for 5 days for URI caused by COVID 19 infection.  Dispense: 6 tablet; Refill: 0  2. Cough Tessalon perls may be taken up to three times daily as needed for cough.  - benzonatate (TESSALON) 200 MG capsule; Take 1 capsule (200 mg total) by mouth 3 (three) times daily as needed for cough.  Dispense: 30 capsule; Refill: 0  General Counseling: Artemis verbalizes understanding of the findings of today's phone visit and agrees with plan of treatment. I  have discussed any further diagnostic evaluation that may be needed or ordered today. We also reviewed her medications today. she has been encouraged to call the office with any questions or concerns that should arise related to todays visit.     Person Under Monitoring Name: UZOAMAKA HAGAMAN  Location: [address] [place] [address]   Infection Prevention Recommendations for Individuals Confirmed to have, or Being Evaluated for, 2019 Novel Coronavirus (COVID-19) Infection Who Receive Care at Home  Individuals who are confirmed to have, or are being evaluated for, COVID-19 should follow the prevention steps below until a healthcare provider or local or state health department says they can return to normal activities.  Stay home except to get medical care You should restrict activities outside your home, except for getting medical care. Do not go to work, school, or public areas, and do not use public transportation or taxis.  Call ahead before visiting your doctor Before your medical appointment, call the healthcare provider and tell them that you have, or are being evaluated for, COVID-19 infection. This will help the healthcare provider's office take steps to keep other people from getting infected. Ask your healthcare provider to call the local or state health department.  Monitor your symptoms Seek prompt medical attention if your illness is worsening (e.g., difficulty breathing). Before going to your medical appointment, call the healthcare provider and tell them that you have, or are being evaluated for, COVID-19 infection. Ask your healthcare provider to call the local or state health department.  Wear a facemask You should wear a facemask that covers your nose and mouth when you are in the same room with other people and when you visit a healthcare provider. People who live with or visit you should also wear a facemask while they are in the same room with  you.  Separate yourself from other people in your home As much as possible, you should stay in a different room from other people in your home. Also, you should use a separate bathroom, if available.  Avoid sharing household items You should not share dishes, drinking glasses, cups, eating utensils, towels, bedding, or other items with other people in your  home. After using these items, you should wash them thoroughly with soap and water.  Cover your coughs and sneezes Cover your mouth and nose with a tissue when you cough or sneeze, or you can cough or sneeze into your sleeve. Throw used tissues in a lined trash can, and immediately wash your hands with soap and water for at least 20 seconds or use an alcohol-based hand rub.  Wash your [facility] your hands often and thoroughly with soap and water for at least 20 seconds. You can use an alcohol-based hand sanitizer if soap and water are not available and if your hands are not visibly dirty. Avoid touching your eyes, nose, and mouth with unwashed hands.   Prevention Steps for Caregivers and Household Members of Individuals Confirmed to have, or Being Evaluated for, COVID-19 Infection Being Cared for in the Home  If you live with, or provide care at home for, a person confirmed to have, or being evaluated for, COVID-19 infection please follow these guidelines to prevent infection:  Follow healthcare provider's instructions Make sure that you understand and can help the patient follow any healthcare provider instructions for all care.  Provide for the patient's basic needs You should help the patient with basic needs in the home and provide support for getting groceries, prescriptions, and other personal needs.  Monitor the patient's symptoms If they are getting sicker, call his or her medical provider and tell them that the patient has, or is being evaluated for, COVID-19 infection. This will help the healthcare provider's office  take steps to keep other people from getting infected. Ask the healthcare provider to call the local or state health department.  Limit the number of people who have contact with the patient  If possible, have only one caregiver for the patient.  Other household members should stay in another home or place of residence. If this is not possible, they should stay  in another room, or be separated from the patient as much as possible. Use a separate bathroom, if available.  Restrict visitors who do not have an essential need to be in the home.  Keep older adults, very young children, and other sick people away from the patient Keep older adults, very young children, and those who have compromised immune systems or chronic health conditions away from the patient. This includes people with chronic heart, lung, or kidney conditions, diabetes, and cancer.  Ensure good ventilation Make sure that shared spaces in the home have good air flow, such as from an air conditioner or an opened window, weather permitting.  Wash your hands often  Wash your hands often and thoroughly with soap and water for at least 20 seconds. You can use an alcohol based hand sanitizer if soap and water are not available and if your hands are not visibly dirty.  Avoid touching your eyes, nose, and mouth with unwashed hands.  Use disposable paper towels to dry your hands. If not available, use dedicated cloth towels and replace them when they become wet.  Wear a facemask and gloves  Wear a disposable facemask at all times in the room and gloves when you touch or have contact with the patient's blood, body fluids, and/or secretions or excretions, such as sweat, saliva, sputum, nasal mucus, vomit, urine, or feces.  Ensure the mask fits over your nose and mouth tightly, and do not touch it during use.  Throw out disposable facemasks and gloves after using them. Do not reuse.  Wash your  hands immediately after removing  your facemask and gloves.  If your personal clothing becomes contaminated, carefully remove clothing and launder. Wash your hands after handling contaminated clothing.  Place all used disposable facemasks, gloves, and other waste in a lined container before disposing them with other household waste.  Remove gloves and wash your hands immediately after handling these items.  Do not share dishes, glasses, or other household items with the patient  Avoid sharing household items. You should not share dishes, drinking glasses, cups, eating utensils, towels, bedding, or other items with a patient who is confirmed to have, or being evaluated for, COVID-19 infection.  After the person uses these items, you should wash them thoroughly with soap and water.  Wash laundry thoroughly  Immediately remove and wash clothes or bedding that have blood, body fluids, and/or secretions or excretions, such as sweat, saliva, sputum, nasal mucus, vomit, urine, or feces, on them.  Wear gloves when handling laundry from the patient.  Read and follow directions on labels of laundry or clothing items and detergent. In general, wash and dry with the warmest temperatures recommended on the label.  Clean all areas the individual has used often  Clean all touchable surfaces, such as counters, tabletops, doorknobs, bathroom fixtures, toilets, phones, keyboards, tablets, and bedside tables, every day. Also, clean any surfaces that may have blood, body fluids, and/or secretions or excretions on them.  Wear gloves when cleaning surfaces the patient has come in contact with.  Use a diluted bleach solution (e.g., dilute bleach with 1 part bleach and 10 parts water) or a household disinfectant with a label that says EPA-registered for coronaviruses. To make a bleach solution at home, add 1 tablespoon of bleach to 1 quart (4 cups) of water. For a larger supply, add  cup of bleach to 1 gallon (16 cups) of water.  Read labels  of cleaning products and follow recommendations provided on product labels. Labels contain instructions for safe and effective use of the cleaning product including precautions you should take when applying the product, such as wearing gloves or eye protection and making sure you have good ventilation during use of the product.  Remove gloves and wash hands immediately after cleaning.  Monitor yourself for signs and symptoms of illness Caregivers and household members are considered close contacts, should monitor their health, and will be asked to limit movement outside of the home to the extent possible. Follow the monitoring steps for close contacts listed on the symptom monitoring form.   ? If you have additional questions, contact your local health department or call the epidemiologist on call at [phone] (available 24/7). ? This guidance is subject to change. For the most up-to-date guidance from [facility], please refer to their website: YouBlogs.pl  This patient was seen by Leretha Pol FNP Collaboration with Dr Lavera Guise as a part of collaborative care agreement   Meds ordered this encounter  Medications  . azithromycin (ZITHROMAX) 250 MG tablet    Sig: z-pack - take as directed for 5 days for URI caused by COVID 19 infection.    Dispense:  6 tablet    Refill:  0    Order Specific Question:   Supervising Provider    Answer:   Lavera Guise [id number]  . benzonatate (TESSALON) 200 MG capsule    Sig: Take 1 capsule (200 mg total) by mouth 3 (three) times daily as needed for cough.    Dispense:  30 capsule    Refill:  0    Order Specific Question:   Supervising Provider    Answer:   Lavera Guise [id number]    Time spent: 23 Minutes    Dr Lavera Guise Internal medicine

## 2019-03-24 ENCOUNTER — Telehealth: Payer: Self-pay | Admitting: Student

## 2019-03-24 ENCOUNTER — Other Ambulatory Visit: Payer: Self-pay | Admitting: Internal Medicine

## 2019-03-24 DIAGNOSIS — Z9109 Other allergy status, other than to drugs and biological substances: Secondary | ICD-10-CM

## 2019-03-24 DIAGNOSIS — J301 Allergic rhinitis due to pollen: Secondary | ICD-10-CM

## 2019-03-24 NOTE — Telephone Encounter (Signed)
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 Appt rschd by Susan/Rad Sch[facility].  Merleen Nicely.

## 2019-03-24 NOTE — Progress Notes (Signed)
Allergy test ordered

## 2019-03-25 ENCOUNTER — Ambulatory Visit: Payer: Medicaid Other

## 2019-03-26 ENCOUNTER — Ambulatory Visit: Payer: Medicaid Other

## 2019-03-27 DIAGNOSIS — R059 Cough, unspecified: Secondary | ICD-10-CM | POA: Insufficient documentation

## 2019-03-27 DIAGNOSIS — R05 Cough: Secondary | ICD-10-CM | POA: Insufficient documentation

## 2019-03-27 DIAGNOSIS — U071 COVID-19: Secondary | ICD-10-CM | POA: Insufficient documentation

## 2019-03-27 DIAGNOSIS — J988 Other specified respiratory disorders: Secondary | ICD-10-CM | POA: Insufficient documentation

## 2019-04-02 ENCOUNTER — Other Ambulatory Visit: Payer: Self-pay

## 2019-04-02 ENCOUNTER — Other Ambulatory Visit: Payer: Medicaid Other | Attending: Gastroenterology

## 2019-04-02 DIAGNOSIS — U071 COVID-19: Secondary | ICD-10-CM

## 2019-04-02 DIAGNOSIS — J988 Other specified respiratory disorders: Secondary | ICD-10-CM

## 2019-04-02 DIAGNOSIS — F411 Generalized anxiety disorder: Secondary | ICD-10-CM

## 2019-04-02 MED ORDER — AZITHROMYCIN 250 MG PO TABS: ORAL_TABLET | ORAL | 0 refills | Status: DC

## 2019-04-02 NOTE — Telephone Encounter (Signed)
Pt called th at she still having sinusitis and sore throat as per adam send zpak and if she not feeling better we can see her as virtual visit

## 2019-04-03 ENCOUNTER — Telehealth: Payer: Self-pay | Admitting: Gastroenterology

## 2019-04-03 NOTE — Telephone Encounter (Signed)
Patients colonoscopy scheduled for 04/06/19 has been canceled.  Chart reviewed she had COVID 03/23/19.  LVM to let her know her colonoscopy has been canceled.  Thanks,  [place], [place]

## 2019-04-03 NOTE — Telephone Encounter (Signed)
Patient is cancelling colonoscopy for 04-06-2019

## 2019-04-06 ENCOUNTER — Ambulatory Visit: Admission: RE | Admit: 2019-04-06 | Payer: Medicaid Other | Source: Home / Self Care | Admitting: Gastroenterology

## 2019-04-06 ENCOUNTER — Encounter: Admission: RE | Payer: Self-pay | Source: Home / Self Care

## 2019-04-06 SURGERY — COLONOSCOPY WITH PROPOFOL
Anesthesia: General

## 2019-04-08 ENCOUNTER — Ambulatory Visit: Payer: Medicaid Other

## 2019-04-17 ENCOUNTER — Other Ambulatory Visit: Payer: Self-pay

## 2019-04-17 ENCOUNTER — Ambulatory Visit: Payer: Medicaid Other

## 2019-04-17 ENCOUNTER — Ambulatory Visit
Admission: RE | Admit: 2019-04-17 | Discharge: 2019-04-17 | Disposition: A | Discharge status: Home / Self Care | Payer: Medicaid Other | Source: Ambulatory Visit | Attending: Student | Admitting: Student

## 2019-04-17 DIAGNOSIS — M5441 Lumbago with sciatica, right side: Secondary | ICD-10-CM | POA: Diagnosis present

## 2019-04-17 DIAGNOSIS — M5136 Other intervertebral disc degeneration, lumbar region: Secondary | ICD-10-CM | POA: Insufficient documentation

## 2019-04-17 DIAGNOSIS — M5442 Lumbago with sciatica, left side: Secondary | ICD-10-CM | POA: Insufficient documentation

## 2019-04-17 DIAGNOSIS — M545 Low back pain, unspecified: Secondary | ICD-10-CM

## 2019-04-17 DIAGNOSIS — G8929 Other chronic pain: Secondary | ICD-10-CM | POA: Insufficient documentation

## 2019-04-20 ENCOUNTER — Telehealth: Payer: Self-pay

## 2019-04-20 NOTE — Telephone Encounter (Signed)
Confirmed and screened for appt for 04/22/19 

## 2019-04-22 ENCOUNTER — Ambulatory Visit: Payer: Medicaid Other

## 2019-04-22 ENCOUNTER — Other Ambulatory Visit: Payer: Self-pay

## 2019-04-22 DIAGNOSIS — G4733 Obstructive sleep apnea (adult) (pediatric): Secondary | ICD-10-CM

## 2019-04-22 NOTE — Progress Notes (Signed)
Pt seen in clinic for pap. Pt s/u with s10 autoset at a pressure of 12. Pt fitted with small Simplus FFM. Pt oriented to machine and policies.

## 2019-05-20 ENCOUNTER — Telehealth: Payer: Self-pay

## 2019-05-20 NOTE — Telephone Encounter (Signed)
CMN signed and placed in [facility] folder.

## 2019-05-25 ENCOUNTER — Encounter: Payer: Self-pay | Admitting: Nurse Practitioner

## 2019-05-27 ENCOUNTER — Ambulatory Visit: Payer: Medicaid Other

## 2019-06-05 ENCOUNTER — Telehealth: Payer: Self-pay

## 2019-06-05 NOTE — Telephone Encounter (Signed)
Called lmom informing patient of appointment on 06/09/2019 and 06/10/2019. klh

## 2019-06-09 ENCOUNTER — Other Ambulatory Visit: Payer: Self-pay

## 2019-06-09 ENCOUNTER — Other Ambulatory Visit: Payer: Self-pay | Admitting: Adult Health

## 2019-06-09 ENCOUNTER — Encounter: Payer: Self-pay | Admitting: Adult Health

## 2019-06-09 ENCOUNTER — Ambulatory Visit: Payer: Medicaid Other | Admitting: Adult Health

## 2019-06-09 VITALS — BP 103/51 | HR 107 | Temp 97.8°F | Resp 16 | Ht 62.0 in | Wt 196.0 lb

## 2019-06-09 DIAGNOSIS — M1990 Unspecified osteoarthritis, unspecified site: Secondary | ICD-10-CM

## 2019-06-09 DIAGNOSIS — M62838 Other muscle spasm: Secondary | ICD-10-CM

## 2019-06-09 DIAGNOSIS — Z9109 Other allergy status, other than to drugs and biological substances: Secondary | ICD-10-CM | POA: Diagnosis not present

## 2019-06-09 DIAGNOSIS — R059 Cough, unspecified: Secondary | ICD-10-CM

## 2019-06-09 DIAGNOSIS — R05 Cough: Secondary | ICD-10-CM

## 2019-06-09 DIAGNOSIS — F17200 Nicotine dependence, unspecified, uncomplicated: Secondary | ICD-10-CM

## 2019-06-09 DIAGNOSIS — G4733 Obstructive sleep apnea (adult) (pediatric): Secondary | ICD-10-CM

## 2019-06-09 DIAGNOSIS — F321 Major depressive disorder, single episode, moderate: Secondary | ICD-10-CM

## 2019-06-09 DIAGNOSIS — M5136 Other intervertebral disc degeneration, lumbar region: Secondary | ICD-10-CM

## 2019-06-09 DIAGNOSIS — F411 Generalized anxiety disorder: Secondary | ICD-10-CM

## 2019-06-09 MED ORDER — TIZANIDINE HCL 2 MG PO CAPS: 2.0000 mg | ORAL_CAPSULE | Freq: Three times a day (TID) | ORAL | 0 refills | Status: DC | PRN

## 2019-06-09 MED ORDER — PREDNISONE 10 MG PO TABS: ORAL_TABLET | ORAL | 0 refills | Status: DC

## 2019-06-09 MED ORDER — MELOXICAM 7.5 MG PO TABS: 7.5000 mg | ORAL_TABLET | Freq: Every day | ORAL | 0 refills | Status: DC

## 2019-06-09 MED ORDER — ALPRAZOLAM 0.25 MG PO TABS: 0.2500 mg | ORAL_TABLET | Freq: Two times a day (BID) | ORAL | 0 refills | Status: DC | PRN

## 2019-06-09 NOTE — Progress Notes (Signed)
[facility] [place], [place] [address]  Internal MEDICINE  Office Visit Note  Patient Name: Margaret Blackburn  [id number]  [id number]  Date of Service: 06/09/2019  Chief Complaint  Patient presents with  . Follow-up    feet and back still hurts  . Depression  . Sleep Apnea    waking up every 2 hours every now in then when turn on right side have very bad pains    HPI  Pt is here for follow up on depression and osa.  She reports her depression is well controlled at this time.  She has not been wearing her cpap in the last 2 weeks, due to excessive allergy symptoms. Today, she complains of   Right side back tightness that radiates around her chest.  This is resolved with hold a breath. It feels like a muscle spasm.  It seems to happen when she twists or turns.     Current Medication: Outpatient Encounter Medications as of 06/09/2019  Medication Sig  . acetaminophen (TYLENOL) 500 MG tablet Take 500 mg by mouth every 6 (six) hours as needed.  Marland Kitchen albuterol (VENTOLIN HFA) 108 (90 Base) MCG/ACT inhaler Inhale 2 puffs into the lungs every 6 (six) hours as needed for wheezing or shortness of breath.  . ALPRAZolam (XANAX) 0.25 MG tablet Take 1 tablet (0.25 mg total) by mouth 2 (two) times daily as needed for anxiety.  Marland Kitchen anastrozole (ARIMIDEX) 1 MG tablet TAKE 1 TABLET BY MOUTH ONCE DAILY  . azithromycin (ZITHROMAX) 250 MG tablet z-pack - take as directed for 5 days for  Sinusitis  . benzonatate (TESSALON) 200 MG capsule Take 1 capsule (200 mg total) by mouth 3 (three) times daily as needed for cough.  . budesonide-formoterol (SYMBICORT) 160-4.5 MCG/ACT inhaler Inhale 2 puffs into the lungs 2 (two) times daily.  . Calcium 600-400 MG-UNIT CHEW Chew 1 Dose by mouth 2 (two) times daily.  . montelukast (SINGULAIR) 10 MG tablet Take 1 tablet (10 mg total) by mouth at bedtime.  . Multiple Vitamins-Minerals (ALIVE WOMENS ENERGY PO) Take 1 Dose by mouth daily.  . predniSONE  (STERAPRED UNI-PAK 21 TAB) 10 MG (21) TBPK tablet Take by mouth daily.  . varenicline (CHANTIX CONTINUING MONTH PAK) 1 MG tablet Take 1 tablet (1 mg total) by mouth 2 (two) times daily.  . [DISCONTINUED] ALPRAZolam (XANAX) 0.25 MG tablet Take 1 tablet (0.25 mg total) by mouth 2 (two) times daily as needed for anxiety.  . [DISCONTINUED] celecoxib (CELEBREX) 100 MG capsule Take 1 capsule (100 mg total) by mouth 2 (two) times daily.  . [DISCONTINUED] sertraline (ZOLOFT) 50 MG tablet TAKE 1 TABLET BY MOUTH ONCE DAILY  . meloxicam (MOBIC) 7.5 MG tablet Take 1 tablet (7.5 mg total) by mouth daily.  . predniSONE (DELTASONE) 10 MG tablet Use per dose pack  . tizanidine (ZANAFLEX) 2 MG capsule Take 1 capsule (2 mg total) by mouth 3 (three) times daily as needed for muscle spasms.   No facility-administered encounter medications on file as of 06/09/2019.    Surgical History: Past Surgical History:  Procedure Laterality Date  . ABDOMINAL HYSTERECTOMY    . BACK SURGERY    . BREAST BIOPSY Right 12/25/2017   affirm bx , x clip,TUBULAR  CARCINOMA  . 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 BREAST LUMPECTOMY WITH NEEDLE LOCALIZATION Right 03/03/2018   Procedure: BREAST LUMPECTOMY WITH NEEDLE LOCALIZATION;  Surgeon: Vickie Epley, MD;  Location: [facility] ORS;  Service: General;  Laterality: Right;  . SENTINEL NODE BIOPSY Right 03/21/2018   Procedure: RIGHT SENTINEL LYMPH  NODE BIOPSY;  Surgeon: Vickie Epley, MD;  Location: [facility] ORS;  Service: General;  Laterality: Right;    Medical History: Past Medical History:  Diagnosis Date  . Allergy   . Arthritis   . Breast cancer ([place]) 12/2017   right breast  . COPD (chronic obstructive pulmonary disease) ([place])   . Depression   . Personal history of radiation therapy   . Prediabetes   . Sleep apnea    supposed to get CPAP 03/25/19    Family History: Family History  Problem Relation Age of Onset  . Breast cancer Mother 67  . Lung  cancer Mother   . Breast cancer Maternal Grandmother   . Thyroid cancer Father     Social History   Socioeconomic History  . Marital status: Single    Spouse name: Not on file  . Number of children: 2  . Years of education: Not on file  . Highest education level: Not on file  Occupational History  . Not on file  Tobacco Use  . Smoking status: Current Every Day Smoker    Packs/day: 1.00    Years: 30.00    Pack years: 30.00    Types: Cigarettes  . Smokeless tobacco: Never Used  . Tobacco comment: in the process of trying to quit-chantix  Substance and Sexual Activity  . Alcohol use: No  . Drug use: Yes    Types: Marijuana    Comment: occ  . Sexual activity: Not Currently  Other Topics Concern  . Not on file  Social History Narrative  . Not on file   Social Determinants of Health   Financial Resource Strain:   . Difficulty of Paying Living Expenses:   Food Insecurity:   . Worried About Charity fundraiser in the Last Year:   . Arboriculturist in the Last Year:   Transportation Needs:   . Film/video editor (Medical):   Marland Kitchen Lack of Transportation (Non-Medical):   Physical Activity:   . Days of Exercise per Week:   . Minutes of Exercise per Session:   Stress:   . Feeling of Stress :   Social Connections:   . Frequency of Communication with Friends and Family:   . Frequency of Social Gatherings with Friends and Family:   . Attends Religious Services:   . Active Member of Clubs or Organizations:   . Attends Archivist Meetings:   Marland Kitchen Marital Status:   Intimate Partner Violence:   . Fear of Current or Ex-Partner:   . Emotionally Abused:   Marland Kitchen Physically Abused:   . Sexually Abused:       Review of Systems  Constitutional: Negative for chills, fatigue and unexpected weight change.  HENT: Negative for congestion, rhinorrhea, sneezing and sore throat.   Eyes: Negative for photophobia, pain and redness.  Respiratory: Negative for cough, chest tightness  and shortness of breath.   Cardiovascular: Negative for chest pain and palpitations.  Gastrointestinal: Negative for abdominal pain, constipation, diarrhea, nausea and vomiting.  Endocrine: Negative.   Genitourinary: Negative for dysuria and frequency.  Musculoskeletal: Positive for back pain. Negative for arthralgias, joint swelling and neck pain.       Right side back pain, circumferential to below right rib cage.  Skin: Negative for rash.  Allergic/Immunologic: Negative.   Neurological: Negative for tremors and numbness.  Hematological: Negative for adenopathy.  Does not bruise/bleed easily.  Psychiatric/Behavioral: Negative for behavioral problems and sleep disturbance. The patient is not nervous/anxious.     Vital Signs: BP (!) 103/51   Pulse (!) 107   Temp 97.8 F (36.6 C)   Resp 16   Ht 5\' 2"  (1.575 m)   Wt 196 lb (88.9 kg)   SpO2 96%   BMI 35.85 kg/m    Physical Exam Vitals and nursing note reviewed.  Constitutional:      General: She is not in acute distress.    Appearance: She is well-developed. She is not diaphoretic.  HENT:     Head: Normocephalic and atraumatic.     Mouth/Throat:     Pharynx: No oropharyngeal exudate.  Eyes:     Pupils: Pupils are equal, round, and reactive to light.  Neck:     Thyroid: No thyromegaly.     Vascular: No JVD.     Trachea: No tracheal deviation.  Cardiovascular:     Rate and Rhythm: Normal rate and regular rhythm.     Heart sounds: Normal heart sounds. No murmur. No friction rub. No gallop.   Pulmonary:     Effort: Pulmonary effort is normal. No respiratory distress.     Breath sounds: Normal breath sounds. No wheezing or rales.  Chest:     Chest wall: No tenderness.  Abdominal:     Palpations: Abdomen is soft.     Tenderness: There is no abdominal tenderness. There is no guarding.  Musculoskeletal:        General: Normal range of motion.     Cervical back: Normal range of motion and neck supple.  Lymphadenopathy:      Cervical: No cervical adenopathy.  Skin:    General: Skin is warm and dry.  Neurological:     Mental Status: She is alert and oriented to person, place, and time.     Cranial Nerves: No cranial nerve deficit.  Psychiatric:        Behavior: Behavior normal.        Thought Content: Thought content normal.        Judgment: Judgment normal.    Assessment/Plan: 1. Obstructive sleep apnea Encouraged continued compliance with cpap therapy at this time.   2. Environmental allergies Continue current therapy.    3. Nicotine dependence with current use Smoking cessation counseling: 1. Pt acknowledges the risks of long term smoking, she will try to quite smoking. 2. Options for different medications including nicotine products, chewing gum, patch etc, Wellbutrin and Chantix is discussed 3. Goal and date of compete cessation is discussed 4. Total time spent in smoking cessation is 15 min.  4. Cough Pt is long time smoker, - DG Chest 2 View; Future  5. Muscle spasm Use zanaflex as directed, do not mix with xanax.  - tizanidine (ZANAFLEX) 2 MG capsule; Take 1 capsule (2 mg total) by mouth 3 (three) times daily as needed for muscle spasms.  Dispense: 30 capsule; Refill: 0  6. GAD (generalized anxiety disorder) Reviewed risks and possible side effects associated with taking opiates, benzodiazepines and other CNS depressants. Combination of these could cause dizziness and drowsiness. Advised patient not to drive or operate machinery when taking these medications, as patient's and other's life can be at risk and will have consequences. Patient verbalized understanding in this matter. Dependence and abuse for these drugs will be monitored closely. A Controlled substance policy and procedure is on file which allows [facility] to order a urine drug  screen test at any visit. Patient understands and agrees with the plan - ALPRAZolam (XANAX) 0.25 MG tablet; Take 1 tablet (0.25 mg total) by  mouth 2 (two) times daily as needed for anxiety.  Dispense: 30 tablet; Refill: 0  7. Arthritis Use Mobic as directed for arthritis - meloxicam (MOBIC) 7.5 MG tablet; Take 1 tablet (7.5 mg total) by mouth daily.  Dispense: 30 tablet; Refill: 0  8. DDD (degenerative disc disease), lumbar Trial or prednisone for lower back pain and radiculopathy.  - predniSONE (DELTASONE) 10 MG tablet; Use per dose pack  Dispense: 21 tablet; Refill: 0  General Counseling: Cornesha verbalizes understanding of the findings of todays visit and agrees with plan of treatment. I have discussed any further diagnostic evaluation that may be needed or ordered today. We also reviewed her medications today. she has been encouraged to call the office with any questions or concerns that should arise related to todays visit.    Orders Placed This Encounter  Procedures  . DG Chest 2 View    Meds ordered this encounter  Medications  . tizanidine (ZANAFLEX) 2 MG capsule    Sig: Take 1 capsule (2 mg total) by mouth 3 (three) times daily as needed for muscle spasms.    Dispense:  30 capsule    Refill:  0  . ALPRAZolam (XANAX) 0.25 MG tablet    Sig: Take 1 tablet (0.25 mg total) by mouth 2 (two) times daily as needed for anxiety.    Dispense:  30 tablet    Refill:  0  . meloxicam (MOBIC) 7.5 MG tablet    Sig: Take 1 tablet (7.5 mg total) by mouth daily.    Dispense:  30 tablet    Refill:  0  . predniSONE (DELTASONE) 10 MG tablet    Sig: Use per dose pack    Dispense:  21 tablet    Refill:  0    Time spent: 30 Minutes   This patient was seen by Orson Gear AGNP-C in Collaboration with Dr Lavera Guise as a part of collaborative care agreement     Kendell Bane AGNP-C Internal medicine

## 2019-06-10 ENCOUNTER — Ambulatory Visit: Payer: Medicaid Other

## 2019-06-10 ENCOUNTER — Ambulatory Visit
Admission: RE | Admit: 2019-06-10 | Discharge: 2019-06-10 | Disposition: A | Discharge status: Home / Self Care | Payer: Medicaid Other | Source: Ambulatory Visit | Attending: Adult Health | Admitting: Adult Health

## 2019-06-10 DIAGNOSIS — R05 Cough: Secondary | ICD-10-CM | POA: Diagnosis present

## 2019-06-10 DIAGNOSIS — R059 Cough, unspecified: Secondary | ICD-10-CM

## 2019-06-24 ENCOUNTER — Ambulatory Visit: Payer: Medicaid Other

## 2019-06-24 ENCOUNTER — Other Ambulatory Visit: Payer: Self-pay

## 2019-06-24 DIAGNOSIS — G4733 Obstructive sleep apnea (adult) (pediatric): Secondary | ICD-10-CM | POA: Diagnosis not present

## 2019-06-24 NOTE — Progress Notes (Signed)
95 percentile pressure 12   95th percentile leak 3.6   apnea index 1.0 /hr  apnea-hypopnea index  2.4 /hr   total days used  >4 hr 1 days  total days used <4 hr 0 days  Total compliance 5 percent

## 2019-06-26 ENCOUNTER — Telehealth: Payer: Self-pay

## 2019-06-26 NOTE — Telephone Encounter (Signed)
Confirmed appointment on 06/30/2019 and screened for covid. klh 

## 2019-06-30 ENCOUNTER — Encounter: Payer: Self-pay | Admitting: Adult Health

## 2019-06-30 ENCOUNTER — Ambulatory Visit: Payer: Medicaid Other | Admitting: Adult Health

## 2019-06-30 ENCOUNTER — Other Ambulatory Visit: Payer: Self-pay

## 2019-06-30 VITALS — BP 106/59 | HR 78 | Temp 97.6°F | Resp 16 | Ht 62.0 in | Wt 202.8 lb

## 2019-06-30 DIAGNOSIS — R0600 Dyspnea, unspecified: Secondary | ICD-10-CM

## 2019-06-30 DIAGNOSIS — G4733 Obstructive sleep apnea (adult) (pediatric): Secondary | ICD-10-CM | POA: Diagnosis not present

## 2019-06-30 DIAGNOSIS — F411 Generalized anxiety disorder: Secondary | ICD-10-CM

## 2019-06-30 DIAGNOSIS — F17200 Nicotine dependence, unspecified, uncomplicated: Secondary | ICD-10-CM

## 2019-06-30 DIAGNOSIS — Z9989 Dependence on other enabling machines and devices: Secondary | ICD-10-CM

## 2019-06-30 DIAGNOSIS — Z9109 Other allergy status, other than to drugs and biological substances: Secondary | ICD-10-CM

## 2019-06-30 DIAGNOSIS — R0609 Other forms of dyspnea: Secondary | ICD-10-CM

## 2019-06-30 NOTE — Progress Notes (Signed)
[facility] [place], [place] [address]  Internal MEDICINE  Office Visit Note  Patient Name: Margaret Blackburn  [id number]  [id number]  Date of Service: 06/30/2019  Chief Complaint  Patient presents with  . Follow-up  . Depression    HPI  Pt is here for follow up on back pain, depression, and hip pain.  She was prescribed muscle relaxer at last visit, and she reports excelelnt relief, and has not had any issues with the back pain since.  She continues to have bilateral hip pain, that she was told was arthritis or bursitis.  She also reports that 4 years ago she was seen at [facility] and told that her heart "looked like she had a massive heart attack"  She never followed up with cardiology at that time.  She would like to have her heart checked. At last visit she had not been using her cpap due to allergy symptoms.  She is now back wearing it nightly.      Current Medication: Outpatient Encounter Medications as of 06/30/2019  Medication Sig  . acetaminophen (TYLENOL) 500 MG tablet Take 500 mg by mouth every 6 (six) hours as needed.  Marland Kitchen albuterol (VENTOLIN HFA) 108 (90 Base) MCG/ACT inhaler Inhale 2 puffs into the lungs every 6 (six) hours as needed for wheezing or shortness of breath.  . ALPRAZolam (XANAX) 0.25 MG tablet Take 1 tablet (0.25 mg total) by mouth 2 (two) times daily as needed for anxiety.  Marland Kitchen anastrozole (ARIMIDEX) 1 MG tablet TAKE 1 TABLET BY MOUTH ONCE DAILY  . azithromycin (ZITHROMAX) 250 MG tablet z-pack - take as directed for 5 days for  Sinusitis  . benzonatate (TESSALON) 200 MG capsule Take 1 capsule (200 mg total) by mouth 3 (three) times daily as needed for cough.  . budesonide-formoterol (SYMBICORT) 160-4.5 MCG/ACT inhaler Inhale 2 puffs into the lungs 2 (two) times daily.  . Calcium 600-400 MG-UNIT CHEW Chew 1 Dose by mouth 2 (two) times daily.  . meloxicam (MOBIC) 7.5 MG tablet Take 1 tablet (7.5 mg total) by mouth daily.  . montelukast  (SINGULAIR) 10 MG tablet Take 1 tablet (10 mg total) by mouth at bedtime.  . Multiple Vitamins-Minerals (ALIVE WOMENS ENERGY PO) Take 1 Dose by mouth daily.  . predniSONE (DELTASONE) 10 MG tablet Use per dose pack  . predniSONE (STERAPRED UNI-PAK 21 TAB) 10 MG (21) TBPK tablet Take by mouth daily.  . sertraline (ZOLOFT) 50 MG tablet TAKE 1 TABLET BY MOUTH ONCE DAILY  . tizanidine (ZANAFLEX) 2 MG capsule Take 1 capsule (2 mg total) by mouth 3 (three) times daily as needed for muscle spasms.  . varenicline (CHANTIX CONTINUING MONTH PAK) 1 MG tablet Take 1 tablet (1 mg total) by mouth 2 (two) times daily.   No facility-administered encounter medications on file as of 06/30/2019.    Surgical History: Past Surgical History:  Procedure Laterality Date  . ABDOMINAL HYSTERECTOMY    . BACK SURGERY    . BREAST BIOPSY Right 12/25/2017   affirm bx , x clip,TUBULAR  CARCINOMA  . 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 BREAST LUMPECTOMY WITH NEEDLE LOCALIZATION Right 03/03/2018   Procedure: BREAST LUMPECTOMY WITH NEEDLE LOCALIZATION;  Surgeon: Vickie Epley, MD;  Location: [facility] ORS;  Service: General;  Laterality: Right;  . SENTINEL NODE BIOPSY Right 03/21/2018   Procedure: RIGHT SENTINEL LYMPH  NODE BIOPSY;  Surgeon: Vickie Epley, MD;  Location: [facility] ORS;  Service: General;  Laterality: Right;    Medical History: Past Medical History:  Diagnosis Date  . Allergy   . Arthritis   . Breast cancer ([place]) 12/2017   right breast  . COPD (chronic obstructive pulmonary disease) ([place])   . Depression   . Personal history of radiation therapy   . Prediabetes   . Sleep apnea    supposed to get CPAP 03/25/19    Family History: Family History  Problem Relation Age of Onset  . Breast cancer Mother 69  . Lung cancer Mother   . Breast cancer Maternal Grandmother   . Thyroid cancer Father     Social History   Socioeconomic History  . Marital status: Single    Spouse name:  Not on file  . Number of children: 2  . Years of education: Not on file  . Highest education level: Not on file  Occupational History  . Not on file  Tobacco Use  . Smoking status: Current Every Day Smoker    Packs/day: 1.00    Years: 30.00    Pack years: 30.00    Types: Cigarettes  . Smokeless tobacco: Never Used  . Tobacco comment: in the process of trying to quit-chantix  Substance and Sexual Activity  . Alcohol use: No  . Drug use: Yes    Types: Marijuana    Comment: occ  . Sexual activity: Not Currently  Other Topics Concern  . Not on file  Social History Narrative  . Not on file   Social Determinants of Health   Financial Resource Strain:   . Difficulty of Paying Living Expenses:   Food Insecurity:   . Worried About Charity fundraiser in the Last Year:   . Arboriculturist in the Last Year:   Transportation Needs:   . Film/video editor (Medical):   Marland Kitchen Lack of Transportation (Non-Medical):   Physical Activity:   . Days of Exercise per Week:   . Minutes of Exercise per Session:   Stress:   . Feeling of Stress :   Social Connections:   . Frequency of Communication with Friends and Family:   . Frequency of Social Gatherings with Friends and Family:   . Attends Religious Services:   . Active Member of Clubs or Organizations:   . Attends Archivist Meetings:   Marland Kitchen Marital Status:   Intimate Partner Violence:   . Fear of Current or Ex-Partner:   . Emotionally Abused:   Marland Kitchen Physically Abused:   . Sexually Abused:       Review of Systems  Constitutional: Negative for chills, fatigue and unexpected weight change.  HENT: Negative for congestion, rhinorrhea, sneezing and sore throat.   Eyes: Negative for photophobia, pain and redness.  Respiratory: Negative for cough, chest tightness and shortness of breath.   Cardiovascular: Negative for chest pain and palpitations.  Gastrointestinal: Negative for abdominal pain, constipation, diarrhea, nausea and  vomiting.  Endocrine: Negative.   Genitourinary: Negative for dysuria and frequency.  Musculoskeletal: Negative for arthralgias, back pain, joint swelling and neck pain.  Skin: Negative for rash.  Allergic/Immunologic: Negative.   Neurological: Negative for tremors and numbness.  Hematological: Negative for adenopathy. Does not bruise/bleed easily.  Psychiatric/Behavioral: Negative for behavioral problems and sleep disturbance. The patient is not nervous/anxious.     Vital Signs: BP (!) 106/59   Pulse 78   Temp 97.6 F (36.4 C)   Resp 16   Ht 5\' 2"  (1.575 m)   Wt 202  lb 12.8 oz (92 kg)   SpO2 96%   BMI 37.09 kg/m    Physical Exam Vitals and nursing note reviewed.  Constitutional:      General: She is not in acute distress.    Appearance: She is well-developed. She is not diaphoretic.  HENT:     Head: Normocephalic and atraumatic.     Mouth/Throat:     Pharynx: No oropharyngeal exudate.  Eyes:     Pupils: Pupils are equal, round, and reactive to light.  Neck:     Thyroid: No thyromegaly.     Vascular: No JVD.     Trachea: No tracheal deviation.  Cardiovascular:     Rate and Rhythm: Normal rate and regular rhythm.     Heart sounds: Normal heart sounds. No murmur. No friction rub. No gallop.   Pulmonary:     Effort: Pulmonary effort is normal. No respiratory distress.     Breath sounds: Normal breath sounds. No wheezing or rales.  Chest:     Chest wall: No tenderness.  Abdominal:     Palpations: Abdomen is soft.     Tenderness: There is no abdominal tenderness. There is no guarding.  Musculoskeletal:        General: Normal range of motion.     Cervical back: Normal range of motion and neck supple.  Lymphadenopathy:     Cervical: No cervical adenopathy.  Skin:    General: Skin is warm and dry.  Neurological:     Mental Status: She is alert and oriented to person, place, and time.     Cranial Nerves: No cranial nerve deficit.  Psychiatric:        Behavior:  Behavior normal.        Thought Content: Thought content normal.        Judgment: Judgment normal.    Assessment/Plan: 1. OSA on CPAP Encouraged continued use of cpap while sleeping.   2. Environmental allergies Stable, continue singulair and otc meds as discussed.   3. Nicotine dependence with current use Smoking cessation counseling: 1. Pt acknowledges the risks of long term smoking, she will try to quite smoking. 2. Options for different medications including nicotine products, chewing gum, patch etc, Wellbutrin and Chantix is discussed 3. Goal and date of compete cessation is discussed 4. Total time spent in smoking cessation is 15 min.  4. GAD (generalized anxiety disorder) Stable, continue present management with zoloft and xanax.   5. Dyspnea on exertion - ECHOCARDIOGRAM COMPLETE; Future  General Counseling: Margaret Blackburn verbalizes understanding of the findings of todays visit and agrees with plan of treatment. I have discussed any further diagnostic evaluation that may be needed or ordered today. We also reviewed her medications today. she has been encouraged to call the office with any questions or concerns that should arise related to todays visit.    Orders Placed This Encounter  Procedures  . ECHOCARDIOGRAM COMPLETE    No orders of the defined types were placed in this encounter.   Time spent: 30 Minutes   This patient was seen by Orson Gear AGNP-C in Collaboration with Dr Lavera Guise as a part of collaborative care agreement     Kendell Bane AGNP-C Internal medicine

## 2019-07-10 ENCOUNTER — Other Ambulatory Visit: Payer: Self-pay

## 2019-07-10 ENCOUNTER — Ambulatory Visit: Payer: Medicaid Other

## 2019-07-10 DIAGNOSIS — R0600 Dyspnea, unspecified: Secondary | ICD-10-CM

## 2019-07-10 DIAGNOSIS — R0609 Other forms of dyspnea: Secondary | ICD-10-CM

## 2019-07-10 DIAGNOSIS — R0602 Shortness of breath: Secondary | ICD-10-CM | POA: Diagnosis not present

## 2019-07-16 ENCOUNTER — Other Ambulatory Visit: Payer: Self-pay | Admitting: Oncology

## 2019-07-17 ENCOUNTER — Telehealth: Payer: Self-pay

## 2019-07-17 NOTE — Telephone Encounter (Signed)
Confirmed appointment on 07/21/2019 and screened for covid. klh  

## 2019-07-21 ENCOUNTER — Other Ambulatory Visit: Payer: Self-pay

## 2019-07-21 ENCOUNTER — Encounter: Payer: Self-pay | Admitting: Adult Health

## 2019-07-21 ENCOUNTER — Ambulatory Visit: Payer: Medicaid Other | Admitting: Adult Health

## 2019-07-21 VITALS — BP 110/69 | HR 82 | Temp 97.7°F | Resp 16 | Ht 62.0 in | Wt 200.8 lb

## 2019-07-21 DIAGNOSIS — M62838 Other muscle spasm: Secondary | ICD-10-CM

## 2019-07-21 DIAGNOSIS — G4733 Obstructive sleep apnea (adult) (pediatric): Secondary | ICD-10-CM | POA: Diagnosis not present

## 2019-07-21 DIAGNOSIS — F17200 Nicotine dependence, unspecified, uncomplicated: Secondary | ICD-10-CM

## 2019-07-21 DIAGNOSIS — F411 Generalized anxiety disorder: Secondary | ICD-10-CM

## 2019-07-21 DIAGNOSIS — M1990 Unspecified osteoarthritis, unspecified site: Secondary | ICD-10-CM | POA: Diagnosis not present

## 2019-07-21 DIAGNOSIS — Z9989 Dependence on other enabling machines and devices: Secondary | ICD-10-CM

## 2019-07-21 DIAGNOSIS — M5136 Other intervertebral disc degeneration, lumbar region: Secondary | ICD-10-CM

## 2019-07-21 MED ORDER — TIZANIDINE HCL 2 MG PO CAPS: 2.0000 mg | ORAL_CAPSULE | Freq: Three times a day (TID) | ORAL | 0 refills | Status: DC | PRN

## 2019-07-21 MED ORDER — ALPRAZOLAM 0.25 MG PO TABS: 0.2500 mg | ORAL_TABLET | Freq: Two times a day (BID) | ORAL | 0 refills | Status: DC | PRN

## 2019-07-21 MED ORDER — MELOXICAM 7.5 MG PO TABS: 7.5000 mg | ORAL_TABLET | Freq: Every day | ORAL | 0 refills | Status: DC

## 2019-07-21 NOTE — Progress Notes (Signed)
[facility] [place], [place] [address]  Internal MEDICINE  Office Visit Note  Patient Name: Margaret Blackburn  [id number]  [id number]  Date of Service: 07/21/2019  Chief Complaint  Patient presents with   Follow-up   Depression    HPI  Pt is here for follow up on Echo results, Back pain, OSA. depression and hip pain.  She reports her hip pain and depression are at baseline.  She denies any worsening symptoms.  Her back pain remains present.  She has had good results with muscle relaxer and meloxicam.  She has been trying to wear her cpap more.  She often will fall asleep on the sofa and not put on her cpap.  She is workign on compliance.  Her Echo showed some diastolic dysfunction, which is conssiten with her OSA.  She also has MR, TR, PR, and AR. Her EF is normal at 65%.     Current Medication: Outpatient Encounter Medications as of 07/21/2019  Medication Sig   acetaminophen (TYLENOL) 500 MG tablet Take 500 mg by mouth every 6 (six) hours as needed.   albuterol (VENTOLIN HFA) 108 (90 Base) MCG/ACT inhaler Inhale 2 puffs into the lungs every 6 (six) hours as needed for wheezing or shortness of breath.   anastrozole (ARIMIDEX) 1 MG tablet TAKE 1 TABLET BY MOUTH ONCE DAILY   azithromycin (ZITHROMAX) 250 MG tablet z-pack - take as directed for 5 days for  Sinusitis   benzonatate (TESSALON) 200 MG capsule Take 1 capsule (200 mg total) by mouth 3 (three) times daily as needed for cough.   budesonide-formoterol (SYMBICORT) 160-4.5 MCG/ACT inhaler Inhale 2 puffs into the lungs 2 (two) times daily.   Calcium 600-400 MG-UNIT CHEW Chew 1 Dose by mouth 2 (two) times daily.   montelukast (SINGULAIR) 10 MG tablet Take 1 tablet (10 mg total) by mouth at bedtime.   Multiple Vitamins-Minerals (ALIVE WOMENS ENERGY PO) Take 1 Dose by mouth daily.   predniSONE (DELTASONE) 10 MG tablet Use per dose pack   predniSONE (STERAPRED UNI-PAK 21 TAB) 10 MG (21) TBPK tablet  Take by mouth daily.   sertraline (ZOLOFT) 50 MG tablet TAKE 1 TABLET BY MOUTH ONCE DAILY   varenicline (CHANTIX CONTINUING MONTH PAK) 1 MG tablet Take 1 tablet (1 mg total) by mouth 2 (two) times daily.   [DISCONTINUED] ALPRAZolam (XANAX) 0.25 MG tablet Take 1 tablet (0.25 mg total) by mouth 2 (two) times daily as needed for anxiety.   [DISCONTINUED] meloxicam (MOBIC) 7.5 MG tablet Take 1 tablet (7.5 mg total) by mouth daily.   [DISCONTINUED] tizanidine (ZANAFLEX) 2 MG capsule Take 1 capsule (2 mg total) by mouth 3 (three) times daily as needed for muscle spasms.   ALPRAZolam (XANAX) 0.25 MG tablet Take 1 tablet (0.25 mg total) by mouth 2 (two) times daily as needed for anxiety.   meloxicam (MOBIC) 7.5 MG tablet Take 1 tablet (7.5 mg total) by mouth daily.   tizanidine (ZANAFLEX) 2 MG capsule Take 1 capsule (2 mg total) by mouth 3 (three) times daily as needed for muscle spasms.   No facility-administered encounter medications on file as of 07/21/2019.    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   BREAST LUMPECTOMY WITH NEEDLE LOCALIZATION Right 03/03/2018  Procedure: BREAST LUMPECTOMY WITH NEEDLE LOCALIZATION;  Surgeon: Vickie Epley, MD;  Location: [facility] ORS;  Service: General;  Laterality: Right;   SENTINEL NODE BIOPSY Right 03/21/2018   Procedure: RIGHT SENTINEL LYMPH  NODE BIOPSY;  Surgeon: Vickie Epley, MD;  Location: [facility] ORS;  Service: General;  Laterality: Right;    Medical History: Past Medical History:  Diagnosis Date   Allergy    Arthritis    Breast cancer ([place]) 12/2017   right breast   COPD (chronic obstructive pulmonary disease) ([place])    Depression    Personal history of radiation therapy    Prediabetes    Sleep apnea    supposed to get  CPAP 03/25/19    Family History: Family History  Problem Relation Age of Onset   Breast cancer Mother 79   Lung cancer Mother    Breast cancer Maternal Grandmother    Thyroid cancer Father     Social History   Socioeconomic History   Marital status: Single    Spouse name: Not on file   Number of children: 2   Years of education: Not on file   Highest education level: Not on file  Occupational History   Not on file  Tobacco Use   Smoking status: Current Every Day Smoker    Packs/day: 1.00    Years: 30.00    Pack years: 30.00    Types: Cigarettes   Smokeless tobacco: Never Used   Tobacco comment: in the process of trying to quit-chantix  Vaping Use   Vaping Use: Former   Devices: used  2 weeks per pt  Substance and Sexual Activity   Alcohol use: No   Drug use: Yes    Types: Marijuana    Comment: occ   Sexual activity: Not Currently  Other Topics Concern   Not on file  Social History Narrative   Not on file   Social Determinants of Health   Financial Resource Strain:    Difficulty of Paying Living Expenses:   Food Insecurity:    Worried About Charity fundraiser in the Last Year:    Arboriculturist in the Last Year:   Transportation Needs:    Film/video editor (Medical):    Lack of Transportation (Non-Medical):   Physical Activity:    Days of Exercise per Week:    Minutes of Exercise per Session:   Stress:    Feeling of Stress :   Social Connections:    Frequency of Communication with Friends and Family:    Frequency of Social Gatherings with Friends and Family:    Attends Religious Services:    Active Member of Clubs or Organizations:    Attends Music therapist:    Marital Status:   Intimate Partner Violence:    Fear of Current or Ex-Partner:    Emotionally Abused:    Physically Abused:    Sexually Abused:       Review of Systems  Constitutional: Negative for chills, fatigue and unexpected  weight change.  HENT: Negative for congestion, rhinorrhea, sneezing and sore throat.   Eyes: Negative for photophobia, pain and redness.  Respiratory: Negative for cough, chest tightness and shortness of breath.   Cardiovascular: Negative for chest pain and palpitations.  Gastrointestinal: Negative for abdominal pain, constipation, diarrhea, nausea and vomiting.  Endocrine: Negative.   Genitourinary: Negative for dysuria and frequency.  Musculoskeletal: Negative for arthralgias, back pain, joint swelling and neck pain.  Skin: Negative for rash.  Allergic/Immunologic: Negative.   Neurological: Negative for tremors and numbness.  Hematological: Negative for adenopathy. Does not bruise/bleed easily.  Psychiatric/Behavioral: Negative for behavioral problems and sleep disturbance. The patient is not nervous/anxious.     Vital Signs: BP 110/69    Pulse 82    Temp 97.7 F (36.5 C)    Resp 16    Ht 5\' 2"  (1.575 m)    Wt 200 lb 12.8 oz (91.1 kg)    SpO2 97%    BMI 36.73 kg/m    Physical Exam Vitals and nursing note reviewed.  Constitutional:      General: She is not in acute distress.    Appearance: She is well-developed. She is not diaphoretic.  HENT:     Head: Normocephalic and atraumatic.     Mouth/Throat:     Pharynx: No oropharyngeal exudate.  Eyes:     Pupils: Pupils are equal, round, and reactive to light.  Neck:     Thyroid: No thyromegaly.     Vascular: No JVD.     Trachea: No tracheal deviation.  Cardiovascular:     Rate and Rhythm: Normal rate and regular rhythm.     Heart sounds: Normal heart sounds. No murmur heard.  No friction rub. No gallop.   Pulmonary:     Effort: Pulmonary effort is normal. No respiratory distress.     Breath sounds: Normal breath sounds. No wheezing or rales.  Chest:     Chest wall: No tenderness.  Abdominal:     Palpations: Abdomen is soft.     Tenderness: There is no abdominal tenderness. There is no guarding.  Musculoskeletal:         General: Normal range of motion.     Cervical back: Normal range of motion and neck supple.  Lymphadenopathy:     Cervical: No cervical adenopathy.  Skin:    General: Skin is warm and dry.  Neurological:     Mental Status: She is alert and oriented to person, place, and time.     Cranial Nerves: No cranial nerve deficit.  Psychiatric:        Behavior: Behavior normal.        Thought Content: Thought content normal.        Judgment: Judgment normal.    Assessment/Plan: 1. Muscle spasm Continue Zanaflex as needed.  - tizanidine (ZANAFLEX) 2 MG capsule; Take 1 capsule (2 mg total) by mouth 3 (three) times daily as needed for muscle spasms.  Dispense: 30 capsule; Refill: 0  2. Arthritis Continue Mobic as needed. - meloxicam (MOBIC) 7.5 MG tablet; Take 1 tablet (7.5 mg total) by mouth daily.  Dispense: 30 tablet; Refill: 0  3. OSA on CPAP Stable, continued to encouraged cpap compliance.   4. Nicotine dependence with current use Smoking cessation counseling: 1. Pt acknowledges the risks of long term smoking, she will try to quite smoking. 2. Options for different medications including nicotine products, chewing gum, patch etc, Wellbutrin and Chantix is discussed 3. Goal and date of compete cessation is discussed 4. Total time spent in smoking cessation is 15 min.  5. GAD (generalized anxiety disorder) Reviewed risks and possible side effects associated with taking opiates, benzodiazepines and other CNS depressants. Combination of these could cause dizziness and drowsiness. Advised patient not to drive or operate machinery when taking these medications, as patient's and other's life can be at risk and will have consequences. Patient verbalized understanding in this matter. Dependence and abuse for these drugs will be  monitored closely. A Controlled substance policy and procedure is on file which allows [facility] to order a urine drug screen test at any visit. Patient  understands and agrees with the plan - ALPRAZolam (XANAX) 0.25 MG tablet; Take 1 tablet (0.25 mg total) by mouth 2 (two) times daily as needed for anxiety.  Dispense: 30 tablet; Refill: 0  6. DDD (degenerative disc disease), lumbar Continue to follow up with neuro as discussed.   General Counseling: Corrinne verbalizes understanding of the findings of todays visit and agrees with plan of treatment. I have discussed any further diagnostic evaluation that may be needed or ordered today. We also reviewed her medications today. she has been encouraged to call the office with any questions or concerns that should arise related to todays visit.    No orders of the defined types were placed in this encounter.   Meds ordered this encounter  Medications   tizanidine (ZANAFLEX) 2 MG capsule    Sig: Take 1 capsule (2 mg total) by mouth 3 (three) times daily as needed for muscle spasms.    Dispense:  30 capsule    Refill:  0   meloxicam (MOBIC) 7.5 MG tablet    Sig: Take 1 tablet (7.5 mg total) by mouth daily.    Dispense:  30 tablet    Refill:  0   ALPRAZolam (XANAX) 0.25 MG tablet    Sig: Take 1 tablet (0.25 mg total) by mouth 2 (two) times daily as needed for anxiety.    Dispense:  30 tablet    Refill:  0    Time spent: 30 Minutes   This patient was seen by Orson Gear AGNP-C in Collaboration with Dr Lavera Guise as a part of collaborative care agreement     Kendell Bane AGNP-C Internal medicine

## 2019-07-22 ENCOUNTER — Ambulatory Visit: Payer: Medicaid Other

## 2019-08-13 ENCOUNTER — Other Ambulatory Visit: Payer: Medicaid Other

## 2019-08-14 ENCOUNTER — Inpatient Hospital Stay: Payer: Medicaid Other

## 2019-08-14 ENCOUNTER — Inpatient Hospital Stay: Payer: Medicaid Other | Admitting: Oncology

## 2019-08-18 ENCOUNTER — Ambulatory Visit: Payer: Medicaid Other | Admitting: Internal Medicine

## 2019-08-25 ENCOUNTER — Inpatient Hospital Stay: Payer: Medicaid Other

## 2019-08-25 ENCOUNTER — Telehealth: Payer: Self-pay

## 2019-08-25 ENCOUNTER — Encounter: Payer: Self-pay | Admitting: Oncology

## 2019-08-25 ENCOUNTER — Inpatient Hospital Stay: Payer: Medicaid Other | Attending: Oncology | Admitting: Oncology

## 2019-08-25 ENCOUNTER — Other Ambulatory Visit: Payer: Self-pay

## 2019-08-25 VITALS — BP 111/71 | HR 92 | Temp 98.8°F | Wt 202.0 lb

## 2019-08-25 DIAGNOSIS — C50411 Malignant neoplasm of upper-outer quadrant of right female breast: Secondary | ICD-10-CM | POA: Insufficient documentation

## 2019-08-25 DIAGNOSIS — Z5181 Encounter for therapeutic drug level monitoring: Secondary | ICD-10-CM

## 2019-08-25 DIAGNOSIS — Z79811 Long term (current) use of aromatase inhibitors: Secondary | ICD-10-CM | POA: Insufficient documentation

## 2019-08-25 DIAGNOSIS — Z79899 Other long term (current) drug therapy: Secondary | ICD-10-CM | POA: Diagnosis not present

## 2019-08-25 DIAGNOSIS — Z08 Encounter for follow-up examination after completed treatment for malignant neoplasm: Secondary | ICD-10-CM

## 2019-08-25 DIAGNOSIS — Z7951 Long term (current) use of inhaled steroids: Secondary | ICD-10-CM | POA: Insufficient documentation

## 2019-08-25 DIAGNOSIS — F329 Major depressive disorder, single episode, unspecified: Secondary | ICD-10-CM | POA: Diagnosis not present

## 2019-08-25 DIAGNOSIS — F17210 Nicotine dependence, cigarettes, uncomplicated: Secondary | ICD-10-CM | POA: Insufficient documentation

## 2019-08-25 DIAGNOSIS — Z170 Estrogen receptor positive status [ER+]: Secondary | ICD-10-CM | POA: Diagnosis not present

## 2019-08-25 DIAGNOSIS — Z803 Family history of malignant neoplasm of breast: Secondary | ICD-10-CM | POA: Diagnosis not present

## 2019-08-25 DIAGNOSIS — Z853 Personal history of malignant neoplasm of breast: Secondary | ICD-10-CM | POA: Diagnosis not present

## 2019-08-25 DIAGNOSIS — Z923 Personal history of irradiation: Secondary | ICD-10-CM | POA: Insufficient documentation

## 2019-08-25 DIAGNOSIS — J449 Chronic obstructive pulmonary disease, unspecified: Secondary | ICD-10-CM | POA: Insufficient documentation

## 2019-08-25 DIAGNOSIS — G4730 Sleep apnea, unspecified: Secondary | ICD-10-CM | POA: Insufficient documentation

## 2019-08-25 DIAGNOSIS — Z808 Family history of malignant neoplasm of other organs or systems: Secondary | ICD-10-CM | POA: Diagnosis not present

## 2019-08-25 DIAGNOSIS — Z801 Family history of malignant neoplasm of trachea, bronchus and lung: Secondary | ICD-10-CM | POA: Diagnosis not present

## 2019-08-25 LAB — COMPREHENSIVE METABOLIC PANEL
ALT: 20 U/L (ref 0–44)
AST: 15 U/L (ref 15–41)
Albumin: 4.2 g/dL (ref 3.5–5.0)
Alkaline Phosphatase: 59 U/L (ref 38–126)
Anion gap: 8 (ref 5–15)
BUN: 14 mg/dL (ref 6–20)
CO2: 26 mmol/L (ref 22–32)
Calcium: 8.7 mg/dL — ABNORMAL LOW (ref 8.9–10.3)
Chloride: 105 mmol/L (ref 98–111)
Creatinine, Ser: 0.65 mg/dL (ref 0.44–1.00)
GFR calc Af Amer: >60 mL/min (ref >60)
GFR calc non Af Amer: >60 mL/min (ref >60)
Glucose, Bld: 119 mg/dL — ABNORMAL HIGH (ref 70–99)
Potassium: 4.1 mmol/L (ref 3.5–5.1)
Sodium: 139 mmol/L (ref 135–145)
Total Bilirubin: 0.8 mg/dL (ref 0.3–1.2)
Total Protein: 7.2 g/dL (ref 6.5–8.1)

## 2019-08-25 NOTE — Progress Notes (Signed)
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 She recently underwent a screening mammogram in November 2019 which showed focal architectural distortion in the upper outer portion of the right breast. Ultrasound of the right axilla was negative for adenopathy. Core biopsy showed atypical small glandular proliferation suspicious for tubular carcinoma. 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 7 mm, grade 1 ER greater than 90% positive, PR 51 to 90% positive and HER-2/neu negative.5 sentinel LN negative for malignancy  Patient is status post hysterectomy but ovaries still in situ. Hormone levels were consistent with postmenopausal status. She completed adjuvant radiation and was started on Arimidex in May 2020   Interval history-patient reports having a low sex drive since she has been on Arimidex.  She has chronic joint pain even prior to starting Arimidex which is not particularly Worse.  She continues to smoke she has been following up with neurology as well for her low back pain  ECOG PS- 1 Pain scale- 0   Review of systems- Review of Systems  Constitutional: Negative for chills, fever, malaise/fatigue and weight loss.  HENT: Negative for congestion, ear discharge and nosebleeds.   Eyes: Negative for blurred vision.  Respiratory: Negative for cough, hemoptysis, sputum production,  shortness of breath and wheezing.   Cardiovascular: Negative for chest pain, palpitations, orthopnea and claudication.  Gastrointestinal: Negative for abdominal pain, blood in stool, constipation, diarrhea, heartburn, melena, nausea and vomiting.  Genitourinary: Negative for dysuria, flank pain, frequency, hematuria and urgency.  Musculoskeletal: Positive for back pain and joint pain. Negative for myalgias.  Skin: Negative for rash.  Neurological: Negative for dizziness, tingling, focal weakness, seizures, weakness and headaches.  Endo/Heme/Allergies: Does not bruise/bleed easily.       Low sex drive  Psychiatric/Behavioral: Negative for depression and suicidal ideas. The patient does not have insomnia.        Allergies  Allergen Reactions  . Penicillins Rash and Other (See Comments)    DID THE REACTION INVOLVE: Swelling of the face/tongue/throat, SOB, or low BP? No Sudden or severe rash/hives, skin peeling, or the inside of the mouth or nose? Yes, blisters. Not immediate. Did it require medical treatment? Yes When did it last happen? 40s If all above answers are "NO", may proceed with cephalosporin use.      Past Medical History:  Diagnosis Date  . Allergy   . Arthritis   . Breast cancer ([place]) 12/2017   right breast  . COPD (chronic obstructive pulmonary disease) ([place])   . Depression   . Personal history of radiation therapy   . Prediabetes   . Sleep apnea    supposed to get CPAP 03/25/19     Past Surgical History:  Procedure Laterality Date  . ABDOMINAL HYSTERECTOMY    . BACK SURGERY    . BREAST BIOPSY Right 12/25/2017   affirm bx , x clip,TUBULAR  CARCINOMA  . 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 BREAST LUMPECTOMY  WITH NEEDLE LOCALIZATION Right 03/03/2018   Procedure: BREAST LUMPECTOMY WITH NEEDLE LOCALIZATION;  Surgeon: Vickie Epley, MD;  Location: [facility] ORS;  Service: General;  Laterality: Right;  . SENTINEL NODE BIOPSY Right 03/21/2018    Procedure: RIGHT SENTINEL LYMPH  NODE BIOPSY;  Surgeon: Vickie Epley, MD;  Location: [facility] ORS;  Service: General;  Laterality: Right;    Social History   Socioeconomic History  . Marital status: Single    Spouse name: Not on file  . Number of children: 2  . Years of education: Not on file  . Highest education level: Not on file  Occupational History  . Not on file  Tobacco Use  . Smoking status: Current Every Day Smoker    Packs/day: 1.00    Years: 30.00    Pack years: 30.00    Types: Cigarettes  . Smokeless tobacco: Never Used  . Tobacco comment: in the process of trying to quit-chantix  Vaping Use  . Vaping Use: Former  . Devices: used  2 weeks per pt  Substance and Sexual Activity  . Alcohol use: No  . Drug use: Yes    Types: Marijuana    Comment: occ  . Sexual activity: Yes  Other Topics Concern  . Not on file  Social History Narrative  . Not on file   Social Determinants of Health   Financial Resource Strain:   . Difficulty of Paying Living Expenses:   Food Insecurity:   . Worried About Charity fundraiser in the Last Year:   . Arboriculturist in the Last Year:   Transportation Needs:   . Film/video editor (Medical):   Marland Kitchen Lack of Transportation (Non-Medical):   Physical Activity:   . Days of Exercise per Week:   . Minutes of Exercise per Session:   Stress:   . Feeling of Stress :   Social Connections:   . Frequency of Communication with Friends and Family:   . Frequency of Social Gatherings with Friends and Family:   . Attends Religious Services:   . Active Member of Clubs or Organizations:   . Attends Archivist Meetings:   Marland Kitchen Marital Status:   Intimate Partner Violence:   . Fear of Current or Ex-Partner:   . Emotionally Abused:   Marland Kitchen Physically Abused:   . Sexually Abused:     Family History  Problem Relation Age of Onset  . Breast cancer Mother 66  . Lung cancer Mother   . Breast cancer Maternal Grandmother   . Thyroid  cancer Father      Current Outpatient Medications:  .  acetaminophen (TYLENOL) 500 MG tablet, Take 500 mg by mouth every 6 (six) hours as needed., Disp: , Rfl:  .  albuterol (VENTOLIN HFA) 108 (90 Base) MCG/ACT inhaler, Inhale 2 puffs into the lungs every 6 (six) hours as needed for wheezing or shortness of breath., Disp: 18 g, Rfl: 0 .  ALPRAZolam (XANAX) 0.25 MG tablet, Take 1 tablet (0.25 mg total) by mouth 2 (two) times daily as needed for anxiety., Disp: 30 tablet, Rfl: 0 .  anastrozole (ARIMIDEX) 1 MG tablet, TAKE 1 TABLET BY MOUTH ONCE DAILY, Disp: 30 tablet, Rfl: 3 .  budesonide-formoterol (SYMBICORT) 160-4.5 MCG/ACT inhaler, Inhale 2 puffs into the lungs 2 (two) times daily., Disp: 1 Inhaler, Rfl: 3 .  Calcium Carb-Cholecalciferol (CALCIUM 600+D3 PO), Take 1 tablet by mouth in the morning and at bedtime., Disp: , Rfl:  .  meloxicam (MOBIC)  7.5 MG tablet, Take 1 tablet (7.5 mg total) by mouth daily., Disp: 30 tablet, Rfl: 0 .  montelukast (SINGULAIR) 10 MG tablet, Take 1 tablet (10 mg total) by mouth at bedtime., Disp: 30 tablet, Rfl: 3 .  Multiple Vitamins-Minerals (ALIVE WOMENS ENERGY PO), Take 1 Dose by mouth daily., Disp: , Rfl:  .  sertraline (ZOLOFT) 50 MG tablet, TAKE 1 TABLET BY MOUTH ONCE DAILY, Disp: 30 tablet, Rfl: 3 .  tizanidine (ZANAFLEX) 2 MG capsule, Take 1 capsule (2 mg total) by mouth 3 (three) times daily as needed for muscle spasms., Disp: 30 capsule, Rfl: 0  Physical exam:  Vitals:   08/25/19 1114  BP: 111/71  Pulse: 92  Temp: 98.8 F (37.1 C)  TempSrc: Tympanic  SpO2: 96%  Weight: 202 lb (91.6 kg)   Physical Exam Constitutional:      General: She is not in acute distress. Pulmonary:     Effort: Pulmonary effort is normal.  Skin:    General: Skin is warm and dry.  Neurological:     Mental Status: She is alert and oriented to person, place, and time.    Breast exam was performed in seated and lying down position. Patient is status post left  lumpectomy with a well-healed surgical scar. No evidence of any palpable masses. No evidence of axillary adenopathy. No evidence of any palpable masses or lumps in the right breast. No evidence of right axillary adenopathy   CMP Latest Ref Rng & Units 08/25/2019  Glucose 70 - 99 mg/dL 119(H)  BUN 6 - 20 mg/dL 14  Creatinine 0.44 - 1.00 mg/dL 0.65  Sodium 135 - 145 mmol/L 139  Potassium 3.5 - 5.1 mmol/L 4.1  Chloride 98 - 111 mmol/L 105  CO2 22 - 32 mmol/L 26  Calcium 8.9 - 10.3 mg/dL 8.7(L)  Total Protein 6.5 - 8.1 g/dL 7.2  Total Bilirubin 0.3 - 1.2 mg/dL 0.8  Alkaline Phos 38 - 126 U/L 59  AST 15 - 41 U/L 15  ALT 0 - 44 U/L 20   CBC Latest Ref Rng & Units 03/06/2019  WBC 3.4 - 10.8 x10E3/uL 9.1  Hemoglobin 11.1 - 15.9 g/dL 13.7  Hematocrit 34.0 - 46.6 % 40.9  Platelets 150 - 450 x10E3/uL 320     Assessment and plan- Patient is a 54 y.o. 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  She does have a low sex drive which can happen with Arimidex but is likely to persist even if we change her to an alternative AI.  Recommend trying nonhormonal lubricants during sexual intercourse.  Patient has chronic low back pain as well and underwent MRI lumbar spine in March 2021 which showed lumbar spine spondylosis.  No evidence of malignancy.  She is seeing neurology for this  Patient will be due for mammogram in October 2021 which will be coordinated by Dr. Adora Fridge.  I will see her back in 6 months for a video visit.  No labs   Visit Diagnosis 1. Visit for monitoring Arimidex therapy   2. Encounter for follow-up surveillance of breast cancer      Dr. Randa Evens, MD, MPH [facility] at [facility] [phone] 08/25/2019 12:01 PM

## 2019-08-25 NOTE — Progress Notes (Signed)
Wants to talk to md about low sex drive. Has numbness still under arm from lymph node surgery. Has right leg numbness with knee to hip. Cont. Pain at time with hips, legs, feet- has seen specialist and not sure what is cause

## 2019-08-25 NOTE — Telephone Encounter (Signed)
Left pt VM advising that I wrote a Out of work note and placed at front desk for pt to pick up.  dbs

## 2019-08-26 ENCOUNTER — Telehealth: Payer: Self-pay

## 2019-08-26 ENCOUNTER — Other Ambulatory Visit: Payer: Self-pay

## 2019-08-26 DIAGNOSIS — Z1211 Encounter for screening for malignant neoplasm of colon: Secondary | ICD-10-CM

## 2019-08-26 NOTE — Telephone Encounter (Signed)
Gastroenterology Pre-Procedure Review  Request Date: 08/31/19 Requesting Physician: Dr. Vicente Males  PATIENT REVIEW QUESTIONS: The patient responded to the following health history questions as indicated:    1. Are you having any GI issues? no 2. Do you have a personal history of Polyps? no 3. Do you have a family history of Colon Cancer or Polyps? no 4. Diabetes Mellitus? no 5. Joint replacements in the past 12 months?Breast Cancer Surgery January 2020 6. Major health problems in the past 3 months?no 7. Any artificial heart valves, MVP, or defibrillator?no    MEDICATIONS & ALLERGIES:    Patient reports the following regarding taking any anticoagulation/antiplatelet therapy:   Plavix, Coumadin, Eliquis, Xarelto, Lovenox, Pradaxa, Brilinta, or Effient? no Aspirin? no  Patient confirms/reports the following medications:  Current Outpatient Medications  Medication Sig Dispense Refill  . acetaminophen (TYLENOL) 500 MG tablet Take 500 mg by mouth every 6 (six) hours as needed.    Marland Kitchen albuterol (VENTOLIN HFA) 108 (90 Base) MCG/ACT inhaler Inhale 2 puffs into the lungs every 6 (six) hours as needed for wheezing or shortness of breath. 18 g 0  . ALPRAZolam (XANAX) 0.25 MG tablet Take 1 tablet (0.25 mg total) by mouth 2 (two) times daily as needed for anxiety. 30 tablet 0  . anastrozole (ARIMIDEX) 1 MG tablet TAKE 1 TABLET BY MOUTH ONCE DAILY 30 tablet 3  . budesonide-formoterol (SYMBICORT) 160-4.5 MCG/ACT inhaler Inhale 2 puffs into the lungs 2 (two) times daily. 1 Inhaler 3  . Calcium Carb-Cholecalciferol (CALCIUM 600+D3 PO) Take 1 tablet by mouth in the morning and at bedtime.    . meloxicam (MOBIC) 7.5 MG tablet Take 1 tablet (7.5 mg total) by mouth daily. 30 tablet 0  . montelukast (SINGULAIR) 10 MG tablet Take 1 tablet (10 mg total) by mouth at bedtime. 30 tablet 3  . Multiple Vitamins-Minerals (ALIVE WOMENS ENERGY PO) Take 1 Dose by mouth daily.    . sertraline (ZOLOFT) 50 MG tablet TAKE 1  TABLET BY MOUTH ONCE DAILY 30 tablet 3  . tizanidine (ZANAFLEX) 2 MG capsule Take 1 capsule (2 mg total) by mouth 3 (three) times daily as needed for muscle spasms. 30 capsule 0   No current facility-administered medications for this visit.    Patient confirms/reports the following allergies:  Allergies  Allergen Reactions  . Penicillins Rash and Other (See Comments)    DID THE REACTION INVOLVE: Swelling of the face/tongue/throat, SOB, or low BP? No Sudden or severe rash/hives, skin peeling, or the inside of the mouth or nose? Yes, blisters. Not immediate. Did it require medical treatment? Yes When did it last happen? 40s If all above answers are "NO", may proceed with cephalosporin use.     No orders of the defined types were placed in this encounter.   AUTHORIZATION INFORMATION Primary Insurance: 1D#: Group #:  Secondary Insurance: 1D#: Group #:  SCHEDULE INFORMATION: Date: 08/31/19 Time: Location:[facility]

## 2019-08-27 ENCOUNTER — Other Ambulatory Visit: Payer: Self-pay

## 2019-08-27 ENCOUNTER — Other Ambulatory Visit
Admission: RE | Admit: 2019-08-27 | Discharge: 2019-08-27 | Disposition: A | Discharge status: Home / Self Care | Payer: Medicaid Other | Source: Ambulatory Visit | Attending: Gastroenterology | Admitting: Gastroenterology

## 2019-08-27 DIAGNOSIS — Z20822 Contact with and (suspected) exposure to covid-19: Secondary | ICD-10-CM | POA: Insufficient documentation

## 2019-08-27 DIAGNOSIS — Z01812 Encounter for preprocedural laboratory examination: Secondary | ICD-10-CM | POA: Diagnosis not present

## 2019-08-27 LAB — SARS CORONAVIRUS 2 (TAT 6-24 HRS): SARS Coronavirus 2: NEGATIVE

## 2019-08-28 ENCOUNTER — Other Ambulatory Visit: Payer: Self-pay | Admitting: Nurse Practitioner

## 2019-08-28 ENCOUNTER — Encounter: Payer: Self-pay | Admitting: Gastroenterology

## 2019-08-28 DIAGNOSIS — M4802 Spinal stenosis, cervical region: Secondary | ICD-10-CM

## 2019-08-28 DIAGNOSIS — R200 Anesthesia of skin: Secondary | ICD-10-CM

## 2019-08-31 ENCOUNTER — Ambulatory Visit: Payer: Medicaid Other | Admitting: Certified Registered Nurse Anesthetist

## 2019-08-31 ENCOUNTER — Other Ambulatory Visit: Payer: Self-pay

## 2019-08-31 ENCOUNTER — Ambulatory Visit
Admission: RE | Admit: 2019-08-31 | Discharge: 2019-08-31 | Disposition: A | Discharge status: Home / Self Care | Payer: Medicaid Other | Attending: Gastroenterology | Admitting: Gastroenterology

## 2019-08-31 ENCOUNTER — Encounter
Admission: RE | Disposition: A | Discharge status: Home / Self Care | Payer: Self-pay | Source: Home / Self Care | Attending: Gastroenterology

## 2019-08-31 ENCOUNTER — Encounter: Payer: Self-pay | Admitting: Gastroenterology

## 2019-08-31 DIAGNOSIS — D126 Benign neoplasm of colon, unspecified: Secondary | ICD-10-CM

## 2019-08-31 DIAGNOSIS — D122 Benign neoplasm of ascending colon: Secondary | ICD-10-CM | POA: Diagnosis not present

## 2019-08-31 DIAGNOSIS — M1990 Unspecified osteoarthritis, unspecified site: Secondary | ICD-10-CM | POA: Insufficient documentation

## 2019-08-31 DIAGNOSIS — G4730 Sleep apnea, unspecified: Secondary | ICD-10-CM | POA: Diagnosis not present

## 2019-08-31 DIAGNOSIS — K5730 Diverticulosis of large intestine without perforation or abscess without bleeding: Secondary | ICD-10-CM | POA: Diagnosis not present

## 2019-08-31 DIAGNOSIS — J449 Chronic obstructive pulmonary disease, unspecified: Secondary | ICD-10-CM | POA: Insufficient documentation

## 2019-08-31 DIAGNOSIS — F17210 Nicotine dependence, cigarettes, uncomplicated: Secondary | ICD-10-CM | POA: Diagnosis not present

## 2019-08-31 DIAGNOSIS — Z7951 Long term (current) use of inhaled steroids: Secondary | ICD-10-CM | POA: Insufficient documentation

## 2019-08-31 DIAGNOSIS — Z1211 Encounter for screening for malignant neoplasm of colon: Secondary | ICD-10-CM | POA: Insufficient documentation

## 2019-08-31 DIAGNOSIS — Z79899 Other long term (current) drug therapy: Secondary | ICD-10-CM | POA: Diagnosis not present

## 2019-08-31 DIAGNOSIS — Z791 Long term (current) use of non-steroidal anti-inflammatories (NSAID): Secondary | ICD-10-CM | POA: Diagnosis not present

## 2019-08-31 DIAGNOSIS — F329 Major depressive disorder, single episode, unspecified: Secondary | ICD-10-CM | POA: Insufficient documentation

## 2019-08-31 DIAGNOSIS — Z880 Allergy status to penicillin: Secondary | ICD-10-CM | POA: Insufficient documentation

## 2019-08-31 HISTORY — PX: COLONOSCOPY WITH PROPOFOL: SHX5780

## 2019-08-31 LAB — GLUCOSE, CAPILLARY: Glucose-Capillary: 117 mg/dL — ABNORMAL HIGH (ref 70–99)

## 2019-08-31 SURGERY — COLONOSCOPY WITH PROPOFOL
Anesthesia: General

## 2019-08-31 MED ORDER — IPRATROPIUM-ALBUTEROL 0.5-2.5 (3) MG/3ML IN SOLN
RESPIRATORY_TRACT | Status: AC
  Administered 2019-08-31: 3 mL via RESPIRATORY_TRACT
  Filled 2019-08-31: qty 3

## 2019-08-31 MED ORDER — IPRATROPIUM-ALBUTEROL 0.5-2.5 (3) MG/3ML IN SOLN: 3.0000 mL | Freq: Once | RESPIRATORY_TRACT | Status: AC

## 2019-08-31 MED ORDER — LIDOCAINE HCL (PF) 2 % IJ SOLN
INTRAMUSCULAR | Status: AC
  Filled 2019-08-31: qty 5

## 2019-08-31 MED ORDER — PROPOFOL 10 MG/ML IV BOLUS
INTRAVENOUS | Status: DC | PRN
  Administered 2019-08-31: 30 mg via INTRAVENOUS
  Administered 2019-08-31: 80 mg via INTRAVENOUS
  Administered 2019-08-31: 30 mg via INTRAVENOUS
  Administered 2019-08-31 (×2): 20 mg via INTRAVENOUS

## 2019-08-31 MED ORDER — LIDOCAINE HCL (CARDIAC) PF 100 MG/5ML IV SOSY
PREFILLED_SYRINGE | INTRAVENOUS | Status: DC | PRN
  Administered 2019-08-31: 100 mg via INTRAVENOUS

## 2019-08-31 MED ORDER — PROPOFOL 500 MG/50ML IV EMUL
INTRAVENOUS | Status: DC | PRN
  Administered 2019-08-31: 100 ug/kg/min via INTRAVENOUS

## 2019-08-31 MED ORDER — PROPOFOL 10 MG/ML IV BOLUS
INTRAVENOUS | Status: AC
  Filled 2019-08-31: qty 80

## 2019-08-31 MED ORDER — SODIUM CHLORIDE 0.9 % IV SOLN: INTRAVENOUS | Status: DC

## 2019-08-31 NOTE — Op Note (Signed)
[facility] Gastroenterology Patient Name: Margaret Blackburn Procedure Date: 08/31/2019 9:13 AM MRN: [id number] Account #: [id number] Date of Birth: 09-20-1965 Admit Type: Outpatient Age: 54 Room: [facility] ENDO ROOM 1 Gender: Female Note Status: Finalized Procedure:             Colonoscopy Indications:           Screening for colorectal malignant neoplasm Providers:             Jonathon Bellows MD, MD Referring MD:          No Local Md, MD (Referring MD) Medicines:             Monitored Anesthesia Care Complications:         No immediate complications. Procedure:             Pre-Anesthesia Assessment:                        - Prior to the procedure, a History and Physical was                         performed, and patient medications, allergies and                         sensitivities were reviewed. The patient's tolerance                         of previous anesthesia was reviewed.                        - The risks and benefits of the procedure and the                         sedation options and risks were discussed with the                         patient. All questions were answered and informed                         consent was obtained.                        - ASA Grade Assessment: II - A patient with mild                         systemic disease.                        After obtaining informed consent, the colonoscope was                         passed under direct vision. Throughout the procedure,                         the patient's blood pressure, pulse, and oxygen                         saturations were monitored continuously. The                         Colonoscope was introduced through the anus  and                         advanced to the the cecum, identified by the                         appendiceal orifice. The colonoscopy was performed                         with ease. The patient tolerated the procedure well.                         The quality of  the bowel preparation was excellent. Findings:      The perianal and digital rectal examinations were normal.      Two sessile polyps were found in the ascending colon. The polyps were 3       to 4 mm in size. These polyps were removed with a cold biopsy forceps.       Resection and retrieval were complete.      Multiple small-mouthed diverticula were found in the sigmoid colon.      The exam was otherwise without abnormality on direct and retroflexion       views. Impression:            - Two 3 to 4 mm polyps in the ascending colon, removed                         with a cold biopsy forceps. Resected and retrieved.                        - Diverticulosis in the sigmoid colon.                        - The examination was otherwise normal on direct and                         retroflexion views. Recommendation:        - Discharge patient to home (with escort).                        - Resume previous diet.                        - Continue present medications.                        - Await pathology results.                        - Repeat colonoscopy for surveillance based on                         pathology results. Procedure Code(s):     --- Professional ---                        [phone], Colonoscopy, flexible; with biopsy, single or                         multiple Diagnosis Code(s):     --- Professional ---  Z12.11, Encounter for screening for malignant neoplasm                         of colon                        K63.5, Polyp of colon                        K57.30, Diverticulosis of large intestine without                         perforation or abscess without bleeding CPT copyright 2019 American Medical Association. All rights reserved. The codes documented in this report are preliminary and upon coder review may  be revised to meet current compliance requirements. Jonathon Bellows, MD Jonathon Bellows MD, MD 08/31/2019 9:56:59 AM This report has been signed  electronically. Number of Addenda: 0 Note Initiated On: 08/31/2019 9:13 AM Scope Withdrawal Time: 0 hours 12 minutes 26 seconds  Total Procedure Duration: 0 hours 17 minutes 28 seconds  Estimated Blood Loss:  Estimated blood loss: none.      [facility]

## 2019-08-31 NOTE — Transfer of Care (Signed)
Immediate Anesthesia Transfer of Care Note  Patient: Margaret Blackburn  Procedure(s) Performed: COLONOSCOPY WITH PROPOFOL (N/A )  Patient Location: PACU and Endoscopy Unit  Anesthesia Type:General  Level of Consciousness: awake, oriented and patient cooperative  Airway & Oxygen Therapy: Patient Spontanous Breathing  Post-op Assessment: Report given to RN and Post -op Vital signs reviewed and stable  Post vital signs: Reviewed and stable  Last Vitals:  Vitals Value Taken Time  BP 102/74 08/31/19 1003  Temp 36.8 C 08/31/19 1003  Pulse 80 08/31/19 1007  Resp 13 08/31/19 1007  SpO2 99 % 08/31/19 1007  Vitals shown include unvalidated device data.  Last Pain:  Vitals:   08/31/19 1003  TempSrc: Tympanic  PainSc: 0-No pain         Complications: No complications documented.

## 2019-08-31 NOTE — H&P (Signed)
Jonathon Bellows, MD [address] [place], [place], [place], [address] [address], [place], [place], [place], [address] Phone: [phone]  Fax: [phone]  Primary Care Physician:  Kendell Bane, NP   Pre-Procedure History & Physical: HPI:  Margaret Blackburn is a 54 y.o. female is here for an colonoscopy.   Past Medical History:  Diagnosis Date  . Allergy   . Arthritis   . Breast cancer ([place]) 12/2017   right breast  . COPD (chronic obstructive pulmonary disease) ([place])   . Depression   . Personal history of radiation therapy   . Prediabetes   . Sleep apnea    supposed to get CPAP 03/25/19    Past Surgical History:  Procedure Laterality Date  . ABDOMINAL HYSTERECTOMY    . BACK SURGERY    . BREAST BIOPSY Right 12/25/2017   affirm bx , x clip,TUBULAR  CARCINOMA  . 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 BREAST LUMPECTOMY WITH NEEDLE LOCALIZATION Right 03/03/2018   Procedure: BREAST LUMPECTOMY WITH NEEDLE LOCALIZATION;  Surgeon: Vickie Epley, MD;  Location: [facility] ORS;  Service: General;  Laterality: Right;  . SENTINEL NODE BIOPSY Right 03/21/2018   Procedure: RIGHT SENTINEL LYMPH  NODE BIOPSY;  Surgeon: Vickie Epley, MD;  Location: [facility] ORS;  Service: General;  Laterality: Right;    Prior to Admission medications   Medication Sig Start Date End Date Taking? Authorizing Provider  albuterol (VENTOLIN HFA) 108 (90 Base) MCG/ACT inhaler Inhale 2 puffs into the lungs every 6 (six) hours as needed for wheezing or shortness of breath. 01/06/19  Yes Scarboro, Audie Clear, NP  ALPRAZolam Duanne Moron) 0.25 MG tablet Take 1 tablet (0.25 mg total) by mouth 2 (two) times daily as needed for anxiety. 07/21/19  Yes Scarboro, Audie Clear, NP  budesonide-formoterol (SYMBICORT) 160-4.5 MCG/ACT inhaler Inhale 2 puffs into the lungs 2 (two) times daily. 01/23/19  Yes Scarboro, Audie Clear, NP  meloxicam (MOBIC) 7.5 MG tablet Take 1 tablet (7.5 mg total) by mouth daily. 07/21/19  Yes  Scarboro, Audie Clear, NP  montelukast (SINGULAIR) 10 MG tablet Take 1 tablet (10 mg total) by mouth at bedtime. 03/18/19  Yes Scarboro, Audie Clear, NP  tizanidine (ZANAFLEX) 2 MG capsule Take 1 capsule (2 mg total) by mouth 3 (three) times daily as needed for muscle spasms. 07/21/19  Yes Scarboro, Audie Clear, NP  acetaminophen (TYLENOL) 500 MG tablet Take 500 mg by mouth every 6 (six) hours as needed.    [provider]  anastrozole (ARIMIDEX) 1 MG tablet TAKE 1 TABLET BY MOUTH ONCE DAILY 07/16/19   Sindy Guadeloupe, MD  Calcium Carb-Cholecalciferol (CALCIUM 600+D3 PO) Take 1 tablet by mouth in the morning and at bedtime.    [provider]  Multiple Vitamins-Minerals (ALIVE WOMENS ENERGY PO) Take 1 Dose by mouth daily.    [provider]  sertraline (ZOLOFT) 50 MG tablet TAKE 1 TABLET BY MOUTH ONCE DAILY 06/09/19   Kendell Bane, NP    Allergies as of 08/26/2019 - Review Complete 08/25/2019  Allergen Reaction Noted  . Penicillins Rash and Other (See Comments) 02/21/2016    Family History  Problem Relation Age of Onset  . Breast cancer Mother 48  . Lung cancer Mother   . Breast cancer Maternal Grandmother   . Thyroid cancer Father     Social History   Socioeconomic History  . Marital status: Single    Spouse name: Not on file  .  Number of children: 2  . Years of education: Not on file  . Highest education level: Not on file  Occupational History  . Not on file  Tobacco Use  . Smoking status: Current Every Day Smoker    Packs/day: 1.00    Years: 30.00    Pack years: 30.00    Types: Cigarettes  . Smokeless tobacco: Never Used  . Tobacco comment: in the process of trying to quit-chantix  Vaping Use  . Vaping Use: Former  . Devices: used  2 weeks per pt  Substance and Sexual Activity  . Alcohol use: No  . Drug use: Yes    Types: Marijuana    Comment: occ  . Sexual activity: Yes  Other Topics Concern  . Not on file  Social History Narrative  . Not on file    Social Determinants of Health   Financial Resource Strain:   . Difficulty of Paying Living Expenses:   Food Insecurity:   . Worried About Charity fundraiser in the Last Year:   . Arboriculturist in the Last Year:   Transportation Needs:   . Film/video editor (Medical):   Marland Kitchen Lack of Transportation (Non-Medical):   Physical Activity:   . Days of Exercise per Week:   . Minutes of Exercise per Session:   Stress:   . Feeling of Stress :   Social Connections:   . Frequency of Communication with Friends and Family:   . Frequency of Social Gatherings with Friends and Family:   . Attends Religious Services:   . Active Member of Clubs or Organizations:   . Attends Archivist Meetings:   Marland Kitchen Marital Status:   Intimate Partner Violence:   . Fear of Current or Ex-Partner:   . Emotionally Abused:   Marland Kitchen Physically Abused:   . Sexually Abused:     Review of Systems: See HPI, otherwise negative ROS  Physical Exam: BP 112/77   Pulse 74   Temp (!) 97.3 F (36.3 C) (Temporal)   Resp 16   Ht 5\' 2"  (1.575 m)   Wt 91.6 kg   SpO2 98%   BMI 36.95 kg/m  General:   Alert,  pleasant and cooperative in NAD Head:  Normocephalic and atraumatic. Neck:  Supple; no masses or thyromegaly. Lungs:  Clear throughout to auscultation, normal respiratory effort.    Heart:  +S1, +S2, Regular rate and rhythm, No edema. Abdomen:  Soft, nontender and nondistended. Normal bowel sounds, without guarding, and without rebound.   Neurologic:  Alert and  oriented x4;  grossly normal neurologically.  Impression/Plan: Margaret Blackburn is here for an colonoscopy to be performed for Screening colonoscopy average risk   Risks, benefits, limitations, and alternatives regarding  colonoscopy have been reviewed with the patient.  Questions have been answered.  All parties agreeable.   Jonathon Bellows, MD  08/31/2019, 9:12 AM

## 2019-08-31 NOTE — Anesthesia Preprocedure Evaluation (Addendum)
Anesthesia Evaluation  Patient identified by MRN, date of birth, ID band Patient awake    Reviewed: Allergy & Precautions, H&P , NPO status , Patient's Chart, lab work & pertinent test results  Airway Mallampati: II  TM Distance: >3 FB     Dental   Pulmonary sleep apnea (non-compliant with CPAP) , COPD,  COPD inhaler, Current Smoker,     + wheezing      Cardiovascular (-) angina(-) Past MI and (-) Cardiac Stents negative cardio ROS  (-) dysrhythmias  Rhythm:regular Rate:Normal     Neuro/Psych PSYCHIATRIC DISORDERS Depression negative neurological ROS     GI/Hepatic negative GI ROS, Neg liver ROS,   Endo/Other  negative endocrine ROS  Renal/GU negative Renal ROS  negative genitourinary   Musculoskeletal   Abdominal   Peds  Hematology negative hematology ROS (+)   Anesthesia Other Findings Past Medical History: No date: Allergy No date: Arthritis 12/2017: Breast cancer ([place])     Comment:  right breast No date: COPD (chronic obstructive pulmonary disease) ([facility]) No date: Depression No date: Personal history of radiation therapy No date: Prediabetes No date: Sleep apnea     Comment:  supposed to get CPAP 03/25/19  Past Surgical History: No date: ABDOMINAL HYSTERECTOMY No date: BACK SURGERY 12/25/2017: BREAST BIOPSY; Right     Comment:  affirm bx , x clip,TUBULAR  CARCINOMA 03/03/2018: BREAST LUMPECTOMY; Right     Comment:  tubular carcinoma 03/03/2018: BREAST LUMPECTOMY WITH NEEDLE LOCALIZATION; Right     Comment:  Procedure: BREAST LUMPECTOMY WITH NEEDLE LOCALIZATION;                Surgeon: Vickie Epley, MD;  Location: [facility] ORS;                Service: General;  Laterality: Right; 03/21/2018: SENTINEL NODE BIOPSY; Right     Comment:  Procedure: RIGHT SENTINEL LYMPH  NODE BIOPSY;  Surgeon:               Vickie Epley, MD;  Location: [facility] ORS;  Service:               General;  Laterality: Right;  BMI     Body Mass Index: 36.95 kg/m      Reproductive/Obstetrics negative OB ROS                            Anesthesia Physical Anesthesia Plan  ASA: III  Anesthesia Plan: General   Post-op Pain Management:    Induction:   PONV Risk Score and Plan: Propofol infusion and TIVA  Airway Management Planned: Nasal Cannula  Additional Equipment:   Intra-op Plan:   Post-operative Plan:   Informed Consent: I have reviewed the patients History and Physical, chart, labs and discussed the procedure including the risks, benefits and alternatives for the proposed anesthesia with the patient or authorized representative who has indicated his/her understanding and acceptance.     Dental Advisory Given  Plan Discussed with: Anesthesiologist, CRNA and Surgeon  Anesthesia Plan Comments:        Anesthesia Quick Evaluation

## 2019-09-01 ENCOUNTER — Encounter: Payer: Self-pay | Admitting: Gastroenterology

## 2019-09-01 LAB — SURGICAL PATHOLOGY

## 2019-09-01 NOTE — Anesthesia Postprocedure Evaluation (Signed)
Anesthesia Post Note  Patient: Margaret Blackburn  Procedure(s) Performed: COLONOSCOPY WITH PROPOFOL (N/A )  Patient location during evaluation: PACU Anesthesia Type: General Level of consciousness: awake and alert Pain management: pain level controlled Vital Signs Assessment: post-procedure vital signs reviewed and stable Respiratory status: spontaneous breathing, nonlabored ventilation and respiratory function stable Cardiovascular status: blood pressure returned to baseline and stable Postop Assessment: no apparent nausea or vomiting Anesthetic complications: no   No complications documented.   Last Vitals:  Vitals:   08/31/19 1013 08/31/19 1023  BP: 109/68 119/66  Pulse: 72 65  Resp: 13 16  Temp:    SpO2: 100% 100%    Last Pain:  Vitals:   09/01/19 0802  TempSrc:   PainSc: 0-No pain                 Brett Canales Chloe Bluett

## 2019-09-03 ENCOUNTER — Telehealth: Payer: Self-pay

## 2019-09-03 NOTE — Telephone Encounter (Signed)
Confirmed appointment on 09/07/2019 and screened for covid. klh

## 2019-09-07 ENCOUNTER — Ambulatory Visit: Payer: Medicaid Other | Admitting: Internal Medicine

## 2019-09-07 ENCOUNTER — Other Ambulatory Visit: Payer: Self-pay

## 2019-09-07 ENCOUNTER — Encounter: Payer: Self-pay | Admitting: Internal Medicine

## 2019-09-07 VITALS — BP 118/74 | HR 95 | Temp 97.7°F | Resp 16 | Ht 62.0 in | Wt 201.0 lb

## 2019-09-07 DIAGNOSIS — Z9109 Other allergy status, other than to drugs and biological substances: Secondary | ICD-10-CM

## 2019-09-07 DIAGNOSIS — F411 Generalized anxiety disorder: Secondary | ICD-10-CM | POA: Diagnosis not present

## 2019-09-07 DIAGNOSIS — F321 Major depressive disorder, single episode, moderate: Secondary | ICD-10-CM

## 2019-09-07 DIAGNOSIS — G4733 Obstructive sleep apnea (adult) (pediatric): Secondary | ICD-10-CM

## 2019-09-07 DIAGNOSIS — F17200 Nicotine dependence, unspecified, uncomplicated: Secondary | ICD-10-CM | POA: Diagnosis not present

## 2019-09-07 DIAGNOSIS — Z9989 Dependence on other enabling machines and devices: Secondary | ICD-10-CM

## 2019-09-07 MED ORDER — ALPRAZOLAM 0.25 MG PO TABS: 0.2500 mg | ORAL_TABLET | Freq: Two times a day (BID) | ORAL | 0 refills | Status: DC | PRN

## 2019-09-07 MED ORDER — SERTRALINE HCL 100 MG PO TABS: 50.0000 mg | ORAL_TABLET | Freq: Every day | ORAL | 1 refills | Status: DC

## 2019-09-07 NOTE — Progress Notes (Signed)
[facility] [place], [place] [address]  Pulmonary Sleep Medicine   Office Visit Note  Patient Name: Margaret Blackburn DOB: 1965-11-05 MRN [id number]  Date of Service: 09/07/2019  Complaints/HPI: Pt is here for pulm follow up. She reports her anxiety has been worse lately.  She has been taking 50mg  of zoloft.  She occasionally needs to take xanax to help her calm down or sleep.  Her CPAP machine continues to give her trouble with sleeping.  Pt reports good compliance with CPAP therapy. Cleaning machine by hand, and changing filters and tubing as directed. Denies headaches, sinus issues, palpitations, or hemoptysis.     ROS  General: (-) fever, (-) chills, (-) night sweats, (-) weakness Skin: (-) rashes, (-) itching,. Eyes: (-) visual changes, (-) redness, (-) itching. Nose and Sinuses: (-) nasal stuffiness or itchiness, (-) postnasal drip, (-) nosebleeds, (-) sinus trouble. Mouth and Throat: (-) sore throat, (-) hoarseness. Neck: (-) swollen glands, (-) enlarged thyroid, (-) neck pain. Respiratory: - cough, (-) bloody sputum, - shortness of breath, - wheezing. Cardiovascular: - ankle swelling, (-) chest pain. Lymphatic: (-) lymph node enlargement. Neurologic: (-) numbness, (-) tingling. Psychiatric: (-) anxiety, (-) depression   Current Medication: Outpatient Encounter Medications as of 09/07/2019  Medication Sig Note  . acetaminophen (TYLENOL) 500 MG tablet Take 500 mg by mouth every 6 (six) hours as needed.   Marland Kitchen albuterol (VENTOLIN HFA) 108 (90 Base) MCG/ACT inhaler Inhale 2 puffs into the lungs every 6 (six) hours as needed for wheezing or shortness of breath.   . ALPRAZolam (XANAX) 0.25 MG tablet Take 1 tablet (0.25 mg total) by mouth 2 (two) times daily as needed for anxiety.   Marland Kitchen anastrozole (ARIMIDEX) 1 MG tablet TAKE 1 TABLET BY MOUTH ONCE DAILY   . budesonide-formoterol (SYMBICORT) 160-4.5 MCG/ACT inhaler Inhale 2 puffs into the lungs 2 (two) times  daily.   . Calcium Carb-Cholecalciferol (CALCIUM 600+D3 PO) Take 1 tablet by mouth in the morning and at bedtime. 08/25/2019: Pt does not know dose of vit d 3  . meloxicam (MOBIC) 7.5 MG tablet Take 1 tablet (7.5 mg total) by mouth daily.   . montelukast (SINGULAIR) 10 MG tablet Take 1 tablet (10 mg total) by mouth at bedtime.   . Multiple Vitamins-Minerals (ALIVE WOMENS ENERGY PO) Take 1 Dose by mouth daily.   . sertraline (ZOLOFT) 50 MG tablet TAKE 1 TABLET BY MOUTH ONCE DAILY   . tizanidine (ZANAFLEX) 2 MG capsule Take 1 capsule (2 mg total) by mouth 3 (three) times daily as needed for muscle spasms.    No facility-administered encounter medications on file as of 09/07/2019.    Surgical History: Past Surgical History:  Procedure Laterality Date  . ABDOMINAL HYSTERECTOMY    . BACK SURGERY    . BREAST BIOPSY Right 12/25/2017   affirm bx , x clip,TUBULAR  CARCINOMA  . 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 BREAST LUMPECTOMY WITH NEEDLE LOCALIZATION Right 03/03/2018   Procedure: BREAST LUMPECTOMY WITH NEEDLE LOCALIZATION;  Surgeon: Vickie Epley, MD;  Location: [facility] ORS;  Service: General;  Laterality: Right;  . COLONOSCOPY WITH PROPOFOL N/A 08/31/2019   Procedure: COLONOSCOPY WITH PROPOFOL;  Surgeon: Jonathon Bellows, MD;  Location: [facility] ENDOSCOPY;  Service: Gastroenterology;  Laterality: N/A;  . SENTINEL NODE BIOPSY Right 03/21/2018   Procedure: RIGHT SENTINEL LYMPH  NODE BIOPSY;  Surgeon: Vickie Epley, MD;  Location: [facility] ORS;  Service: General;  Laterality: Right;  Medical History: Past Medical History:  Diagnosis Date  . Allergy   . Arthritis   . Breast cancer ([place]) 12/2017   right breast  . COPD (chronic obstructive pulmonary disease) ([place])   . Depression   . Personal history of radiation therapy   . Prediabetes   . Sleep apnea    supposed to get CPAP 03/25/19    Family History: Family History  Problem Relation Age of Onset  . Breast cancer Mother  38  . Lung cancer Mother   . Breast cancer Maternal Grandmother   . Thyroid cancer Father     Social History: Social History   Socioeconomic History  . Marital status: Single    Spouse name: Not on file  . Number of children: 2  . Years of education: Not on file  . Highest education level: Not on file  Occupational History  . Not on file  Tobacco Use  . Smoking status: Current Every Day Smoker    Packs/day: 1.00    Years: 30.00    Pack years: 30.00    Types: Cigarettes  . Smokeless tobacco: Never Used  . Tobacco comment: in the process of trying to quit-chantix  Vaping Use  . Vaping Use: Former  . Devices: used  2 weeks per pt  Substance and Sexual Activity  . Alcohol use: No  . Drug use: Yes    Types: Marijuana    Comment: occ  . Sexual activity: Yes  Other Topics Concern  . Not on file  Social History Narrative  . Not on file   Social Determinants of Health   Financial Resource Strain:   . Difficulty of Paying Living Expenses:   Food Insecurity:   . Worried About Charity fundraiser in the Last Year:   . Arboriculturist in the Last Year:   Transportation Needs:   . Film/video editor (Medical):   Marland Kitchen Lack of Transportation (Non-Medical):   Physical Activity:   . Days of Exercise per Week:   . Minutes of Exercise per Session:   Stress:   . Feeling of Stress :   Social Connections:   . Frequency of Communication with Friends and Family:   . Frequency of Social Gatherings with Friends and Family:   . Attends Religious Services:   . Active Member of Clubs or Organizations:   . Attends Archivist Meetings:   Marland Kitchen Marital Status:   Intimate Partner Violence:   . Fear of Current or Ex-Partner:   . Emotionally Abused:   Marland Kitchen Physically Abused:   . Sexually Abused:     Vital Signs: Blood pressure 118/74, pulse 95, temperature 97.7 F (36.5 C), resp. rate 16, height 5\' 2"  (1.575 m), weight 201 lb (91.2 kg), SpO2 97 %.  Examination: General  Appearance: The patient is well-developed, well-nourished, and in no distress. Skin: Gross inspection of skin unremarkable. Head: normocephalic, no gross deformities. Eyes: no gross deformities noted. ENT: ears appear grossly normal no exudates. Neck: Supple. No thyromegaly. No LAD. Respiratory: clear bilaterally. Cardiovascular: Normal S1 and S2 without murmur or rub. Extremities: No cyanosis. pulses are equal. Neurologic: Alert and oriented. No involuntary movements.  LABS: Recent Results (from the past 2160 hour(s))  Comprehensive metabolic panel     Status: Abnormal   Collection Time: 08/25/19 10:40 AM  Result Value Ref Range   Sodium 139 135 - 145 mmol/L   Potassium 4.1 3.5 - 5.1 mmol/L   Chloride 105 98 - 111  mmol/L   CO2 26 22 - 32 mmol/L   Glucose, Bld 119 (H) 70 - 99 mg/dL    Comment: Glucose reference range applies only to samples taken after fasting for at least 8 hours.   BUN 14 6 - 20 mg/dL   Creatinine, Ser 0.65 0.44 - 1.00 mg/dL   Calcium 8.7 (L) 8.9 - 10.3 mg/dL   Total Protein 7.2 6.5 - 8.1 g/dL   Albumin 4.2 3.5 - 5.0 g/dL   AST 15 15 - 41 U/L   ALT 20 0 - 44 U/L   Alkaline Phosphatase 59 38 - 126 U/L   Total Bilirubin 0.8 0.3 - 1.2 mg/dL   GFR calc non Af Amer >60 >60 mL/min   GFR calc Af Amer >60 >60 mL/min   Anion gap 8 5 - 15    Comment: Performed at [facility], [place], [place] [address]  SARS CORONAVIRUS 2 (TAT 6-24 HRS) Nasopharyngeal Nasopharyngeal Swab     Status: None   Collection Time: 08/27/19 10:01 AM   Specimen: Nasopharyngeal Swab  Result Value Ref Range   SARS Coronavirus 2 NEGATIVE NEGATIVE    Comment: (NOTE) SARS-CoV-2 target nucleic acids are NOT DETECTED.  The SARS-CoV-2 RNA is generally detectable in upper and lower respiratory specimens during the acute phase of infection. Negative results do not preclude SARS-CoV-2 infection, do not rule out co-infections with other pathogens, and should not be used as  the sole basis for treatment or other patient management decisions. Negative results must be combined with clinical observations, patient history, and epidemiological information. The expected result is Negative.  Fact Sheet for Patients: SugarRoll.be  Fact Sheet for Healthcare Providers: https://www.woods-mathews.com/  This test is not yet approved or cleared by the [place] FDA and  has been authorized for detection and/or diagnosis of SARS-CoV-2 by FDA under an Emergency Use Authorization (EUA). This EUA will remain  in effect (meaning this test can be used) for the duration of the COVID-19 declaration under Se ction 564(b)(1) of the Act, 21 U.S.C. section 360bbb-3(b)(1), unless the authorization is terminated or revoked sooner.  Performed at [facility], [place] [address], [place], [place] [address]   Glucose, capillary     Status: Abnormal   Collection Time: 08/31/19  9:03 AM  Result Value Ref Range   Glucose-Capillary 117 (H) 70 - 99 mg/dL    Comment: Glucose reference range applies only to samples taken after fasting for at least 8 hours.  Surgical pathology     Status: None   Collection Time: 08/31/19  9:51 AM  Result Value Ref Range   SURGICAL PATHOLOGY      SURGICAL PATHOLOGY CASE: [phone] PATIENT: [facility] Surgical Pathology Report     Specimen Submitted: A. Colon polyp x2, ascending; cbx  Clinical History: Screening colonoscopy     DIAGNOSIS: A.  COLON POLYP X 2, ASCENDING; COLD BIOPSY: - TUBULAR ADENOMA, NEGATIVE FOR HIGH-GRADE DYSPLASIA AND MALIGNANCY. - TWO MUCOSAL FRAGMENTS, 2-3 MM, WITHOUT PATHOLOGIC CHANGES.  GROSS DESCRIPTION: A. Labeled: C BX ascending colon polyps x2 Received: Formalin Tissue fragment(s): 3 Size: 0.2-0.3 cm Description: Tan soft tissue fragments Entirely submitted in 1 cassette.   Final Diagnosis performed by Bryan Lemma, MD.   Electronically signed 09/01/2019  8:30:21PM The electronic signature indicates that the named Attending Pathologist has evaluated the specimen Technical component performed at [facility], [address], [place], [place] [address] Lab: [phone] Dir: Rush Farmer, MD, MMM  Professional component performed at  [facility], [address] [place], [place], [place] [address] Lab: [phone] Dir: Dellia Nims. Reuel Derby, MD     Radiology: No results found.  No results found.  No results found.    Assessment and Plan: Patient Active Problem List   Diagnosis Date Noted  . Respiratory tract infection due to COVID-19 virus 03/27/2019  . Cough 03/27/2019  . Invasive carcinoma of breast ([place]) 03/24/2018  . Goals of care, counseling/discussion 03/24/2018  . Malignant neoplasm of right breast greater than or equal to 2 cm in greatest dimension ([place])   . Mass of right breast 02/24/2018   1. OSA on CPAP Continue to wear cpap nightly.   2. Depression, major, single episode, moderate ([facility]) Increase dose of zoloft to 100mg  daily. Pt will take 75mg  daily for one week before increasing to 100mg .  - sertraline (ZOLOFT) 100 MG tablet; Take 0.5 tablets (50 mg total) by mouth daily.  Dispense: 30 tablet; Refill: 1  3. GAD (generalized anxiety disorder) Reviewed risks and possible side effects associated with taking opiates, benzodiazepines and other CNS depressants. Combination of these could cause dizziness and drowsiness. Advised patient not to drive or operate machinery when taking these medications, as patient's and other's life can be at risk and will have consequences. Patient verbalized understanding in this matter. Dependence and abuse for these drugs will be monitored closely. A Controlled substance policy and procedure is on file which allows [facility] to order a urine drug screen test at any visit. Patient understands and agrees with the plan - ALPRAZolam (XANAX) 0.25 MG tablet; Take 1 tablet  (0.25 mg total) by mouth 2 (two) times daily as needed for anxiety.  Dispense: 30 tablet; Refill: 0  4. Nicotine dependence with current use Smoking cessation counseling: 1. Pt acknowledges the risks of long term smoking, she will try to quite smoking. 2. Options for different medications including nicotine products, chewing gum, patch etc, Wellbutrin and Chantix is discussed 3. Goal and date of compete cessation is discussed 4. Total time spent in smoking cessation is 15 min.   5. Environmental allergies Overall doing well, continue current management.   General Counseling: I have discussed the findings of the evaluation and examination with [facility].  I have also discussed any further diagnostic evaluation thatmay be needed or ordered today. Trinnity verbalizes understanding of the findings of todays visit. We also reviewed her medications today and discussed drug interactions and side effects including but not limited excessive drowsiness and altered mental states. We also discussed that there is always a risk not just to her but also people around her. she has been encouraged to call the office with any questions or concerns that should arise related to todays visit.  No orders of the defined types were placed in this encounter.    Time spent: 30 This patient was seen by Orson Gear AGNP-C in Collaboration with Dr. Devona Konig as a part of collaborative care agreement.   I have personally obtained a history, examined the patient, evaluated laboratory and imaging results, formulated the assessment and plan and placed orders.    Allyne Gee, MD [facility] Pulmonary and Critical Care Sleep medicine

## 2019-09-10 ENCOUNTER — Ambulatory Visit: Payer: Medicaid Other

## 2019-09-15 ENCOUNTER — Other Ambulatory Visit: Payer: Self-pay | Admitting: Adult Health

## 2019-09-15 DIAGNOSIS — Z9109 Other allergy status, other than to drugs and biological substances: Secondary | ICD-10-CM

## 2019-09-15 DIAGNOSIS — M1990 Unspecified osteoarthritis, unspecified site: Secondary | ICD-10-CM

## 2019-09-23 ENCOUNTER — Ambulatory Visit: Payer: Medicaid Other

## 2019-09-24 ENCOUNTER — Telehealth: Payer: Self-pay

## 2019-09-24 NOTE — Telephone Encounter (Signed)
LMOM for office visit 8/23

## 2019-09-25 ENCOUNTER — Other Ambulatory Visit: Payer: Self-pay

## 2019-09-25 ENCOUNTER — Ambulatory Visit
Admission: RE | Admit: 2019-09-25 | Discharge: 2019-09-25 | Disposition: A | Discharge status: Home / Self Care | Payer: Medicaid Other | Source: Ambulatory Visit | Attending: Nurse Practitioner | Admitting: Nurse Practitioner

## 2019-09-25 DIAGNOSIS — R200 Anesthesia of skin: Secondary | ICD-10-CM | POA: Insufficient documentation

## 2019-09-25 DIAGNOSIS — R202 Paresthesia of skin: Secondary | ICD-10-CM | POA: Insufficient documentation

## 2019-09-25 DIAGNOSIS — M4802 Spinal stenosis, cervical region: Secondary | ICD-10-CM

## 2019-09-28 ENCOUNTER — Other Ambulatory Visit: Payer: Self-pay

## 2019-09-28 ENCOUNTER — Ambulatory Visit: Payer: Medicaid Other | Admitting: Adult Health

## 2019-09-28 ENCOUNTER — Encounter: Payer: Self-pay | Admitting: Adult Health

## 2019-09-28 VITALS — BP 118/80 | HR 75 | Temp 97.4°F | Resp 16 | Ht 62.0 in | Wt 201.8 lb

## 2019-09-28 DIAGNOSIS — F321 Major depressive disorder, single episode, moderate: Secondary | ICD-10-CM | POA: Diagnosis not present

## 2019-09-28 DIAGNOSIS — F17200 Nicotine dependence, unspecified, uncomplicated: Secondary | ICD-10-CM

## 2019-09-28 DIAGNOSIS — F411 Generalized anxiety disorder: Secondary | ICD-10-CM | POA: Diagnosis not present

## 2019-09-28 DIAGNOSIS — M5136 Other intervertebral disc degeneration, lumbar region: Secondary | ICD-10-CM

## 2019-09-28 DIAGNOSIS — Z9109 Other allergy status, other than to drugs and biological substances: Secondary | ICD-10-CM

## 2019-09-28 DIAGNOSIS — M1990 Unspecified osteoarthritis, unspecified site: Secondary | ICD-10-CM

## 2019-09-28 MED ORDER — MELOXICAM 15 MG PO TABS: 15.0000 mg | ORAL_TABLET | Freq: Every day | ORAL | 1 refills | Status: DC

## 2019-09-28 NOTE — Progress Notes (Signed)
[facility] [place], [place] [address]  Internal MEDICINE  Office Visit Note  Patient Name: Margaret Blackburn  [id number]  [id number]  Date of Service: 09/28/2019  Chief Complaint  Patient presents with  . Follow-up    3 week new dose of anxiety medication  . Quality Metric Gaps    HIV screen, Hep C screen, Tdap, Influenza vacc    HPI  Pt is here for 3 week follow up on anxiety.  At last visit her zoloft was increased from 50 to 100mg .  She reports good relief of symptoms. She continues to deny SI or HI.  She is pleasant and cooperative in exam room. She continues to have leg pain, and is seeing ortho for her back and neck.    Current Medication: Outpatient Encounter Medications as of 09/28/2019  Medication Sig Note  . acetaminophen (TYLENOL) 500 MG tablet Take 500 mg by mouth every 6 (six) hours as needed.   Marland Kitchen albuterol (VENTOLIN HFA) 108 (90 Base) MCG/ACT inhaler Inhale 2 puffs into the lungs every 6 (six) hours as needed for wheezing or shortness of breath.   . ALPRAZolam (XANAX) 0.25 MG tablet Take 1 tablet (0.25 mg total) by mouth 2 (two) times daily as needed for anxiety.   Marland Kitchen anastrozole (ARIMIDEX) 1 MG tablet TAKE 1 TABLET BY MOUTH ONCE DAILY   . budesonide-formoterol (SYMBICORT) 160-4.5 MCG/ACT inhaler Inhale 2 puffs into the lungs 2 (two) times daily.   . Calcium Carb-Cholecalciferol (CALCIUM 600+D3 PO) Take 1 tablet by mouth in the morning and at bedtime. 08/25/2019: Pt does not know dose of vit d 3  . meloxicam (MOBIC) 7.5 MG tablet TAKE 1 TABLET BY MOUTH ONCE A DAY   . montelukast (SINGULAIR) 10 MG tablet TAKE ONE TABLET BY MOUTH AT BEDTIME   . Multiple Vitamins-Minerals (ALIVE WOMENS ENERGY PO) Take 1 Dose by mouth daily.   . sertraline (ZOLOFT) 100 MG tablet Take 0.5 tablets (50 mg total) by mouth daily.   . tizanidine (ZANAFLEX) 2 MG capsule Take 1 capsule (2 mg total) by mouth 3 (three) times daily as needed for muscle spasms.    No  facility-administered encounter medications on file as of 09/28/2019.    Surgical History: Past Surgical History:  Procedure Laterality Date  . ABDOMINAL HYSTERECTOMY    . BACK SURGERY    . BREAST BIOPSY Right 12/25/2017   affirm bx , x clip,TUBULAR  CARCINOMA  . 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 BREAST LUMPECTOMY WITH NEEDLE LOCALIZATION Right 03/03/2018   Procedure: BREAST LUMPECTOMY WITH NEEDLE LOCALIZATION;  Surgeon: Vickie Epley, MD;  Location: [facility] ORS;  Service: General;  Laterality: Right;  . COLONOSCOPY WITH PROPOFOL N/A 08/31/2019   Procedure: COLONOSCOPY WITH PROPOFOL;  Surgeon: Jonathon Bellows, MD;  Location: [facility] ENDOSCOPY;  Service: Gastroenterology;  Laterality: N/A;  . SENTINEL NODE BIOPSY Right 03/21/2018   Procedure: RIGHT SENTINEL LYMPH  NODE BIOPSY;  Surgeon: Vickie Epley, MD;  Location: [facility] ORS;  Service: General;  Laterality: Right;    Medical History: Past Medical History:  Diagnosis Date  . Allergy   . Arthritis   . Breast cancer ([place]) 12/2017   right breast  . COPD (chronic obstructive pulmonary disease) ([place])   . Depression   . Personal history of radiation therapy   . Prediabetes   . Sleep apnea    supposed to get CPAP 03/25/19    Family History: Family History  Problem Relation Age  of Onset  . Breast cancer Mother 26  . Lung cancer Mother   . Breast cancer Maternal Grandmother   . Thyroid cancer Father     Social History   Socioeconomic History  . Marital status: Single    Spouse name: Not on file  . Number of children: 2  . Years of education: Not on file  . Highest education level: Not on file  Occupational History  . Not on file  Tobacco Use  . Smoking status: Current Every Day Smoker    Packs/day: 1.00    Years: 30.00    Pack years: 30.00    Types: Cigarettes  . Smokeless tobacco: Never Used  . Tobacco comment: in the process of trying to quit-chantix  Vaping Use  . Vaping Use: Former  . Devices:  used  2 weeks per pt  Substance and Sexual Activity  . Alcohol use: No  . Drug use: Yes    Types: Marijuana    Comment: occ  . Sexual activity: Yes  Other Topics Concern  . Not on file  Social History Narrative  . Not on file   Social Determinants of Health   Financial Resource Strain:   . Difficulty of Paying Living Expenses: Not on file  Food Insecurity:   . Worried About Charity fundraiser in the Last Year: Not on file  . Ran Out of Food in the Last Year: Not on file  Transportation Needs:   . Lack of Transportation (Medical): Not on file  . Lack of Transportation (Non-Medical): Not on file  Physical Activity:   . Days of Exercise per Week: Not on file  . Minutes of Exercise per Session: Not on file  Stress:   . Feeling of Stress : Not on file  Social Connections:   . Frequency of Communication with Friends and Family: Not on file  . Frequency of Social Gatherings with Friends and Family: Not on file  . Attends Religious Services: Not on file  . Active Member of Clubs or Organizations: Not on file  . Attends Archivist Meetings: Not on file  . Marital Status: Not on file  Intimate Partner Violence:   . Fear of Current or Ex-Partner: Not on file  . Emotionally Abused: Not on file  . Physically Abused: Not on file  . Sexually Abused: Not on file      Review of Systems  Constitutional: Negative for chills, fatigue and unexpected weight change.  HENT: Negative for congestion, rhinorrhea, sneezing and sore throat.   Eyes: Negative for photophobia, pain and redness.  Respiratory: Negative for cough, chest tightness and shortness of breath.   Cardiovascular: Negative for chest pain and palpitations.  Gastrointestinal: Negative for abdominal pain, constipation, diarrhea, nausea and vomiting.  Endocrine: Negative.   Genitourinary: Negative for dysuria and frequency.  Musculoskeletal: Negative for arthralgias, back pain, joint swelling and neck pain.  Skin:  Negative for rash.  Allergic/Immunologic: Negative.   Neurological: Negative for tremors and numbness.  Hematological: Negative for adenopathy. Does not bruise/bleed easily.  Psychiatric/Behavioral: Negative for behavioral problems and sleep disturbance. The patient is not nervous/anxious.     Vital Signs: BP 118/80   Pulse 75   Temp (!) 97.4 F (36.3 C)   Resp 16   Ht 5\' 2"  (1.575 m)   Wt 201 lb 12.8 oz (91.5 kg)   SpO2 97%   BMI 36.91 kg/m    Physical Exam Vitals and nursing note reviewed.  Constitutional:  General: She is not in acute distress.    Appearance: She is well-developed. She is not diaphoretic.  HENT:     Head: Normocephalic and atraumatic.     Mouth/Throat:     Pharynx: No oropharyngeal exudate.  Eyes:     Pupils: Pupils are equal, round, and reactive to light.  Neck:     Thyroid: No thyromegaly.     Vascular: No JVD.     Trachea: No tracheal deviation.  Cardiovascular:     Rate and Rhythm: Normal rate and regular rhythm.     Heart sounds: Normal heart sounds. No murmur heard.  No friction rub. No gallop.   Pulmonary:     Effort: Pulmonary effort is normal. No respiratory distress.     Breath sounds: Normal breath sounds. No wheezing or rales.  Chest:     Chest wall: No tenderness.  Abdominal:     Palpations: Abdomen is soft.     Tenderness: There is no abdominal tenderness. There is no guarding.  Musculoskeletal:        General: Normal range of motion.     Cervical back: Normal range of motion and neck supple.  Lymphadenopathy:     Cervical: No cervical adenopathy.  Skin:    General: Skin is warm and dry.  Neurological:     Mental Status: She is alert and oriented to person, place, and time.     Cranial Nerves: No cranial nerve deficit.  Psychiatric:        Behavior: Behavior normal.        Thought Content: Thought content normal.        Judgment: Judgment normal.    Assessment/Plan: 1. GAD (generalized anxiety disorder) Doing  better since increase in zoloft.  Continue with zoloft at 100mg  daily.   2. Depression, major, single episode, moderate ([place]) Well controlled on new dose of zoloft.   3. Environmental allergies Currently controlled.  4. Nicotine dependence with current use Smoking cessation counseling: 1. Pt acknowledges the risks of long term smoking, she will try to quite smoking. 2. Options for different medications including nicotine products, chewing gum, patch etc, Wellbutrin and Chantix is discussed 3. Goal and date of compete cessation is discussed 4. Total time spent in smoking cessation is 15 min.  5. DDD (degenerative disc disease), lumbar Continue to follow up with Ortho as before.   General Counseling: Margaret Blackburn verbalizes understanding of the findings of todays visit and agrees with plan of treatment. I have discussed any further diagnostic evaluation that may be needed or ordered today. We also reviewed her medications today. she has been encouraged to call the office with any questions or concerns that should arise related to todays visit.    No orders of the defined types were placed in this encounter.   No orders of the defined types were placed in this encounter.   Time spent: 30 Minutes   This patient was seen by Orson Gear AGNP-C in Collaboration with Dr Lavera Guise as a part of collaborative care agreement     Kendell Bane AGNP-C Internal medicine

## 2019-10-06 ENCOUNTER — Other Ambulatory Visit (HOSPITAL_COMMUNITY): Payer: Self-pay | Admitting: Nurse Practitioner

## 2019-10-06 ENCOUNTER — Other Ambulatory Visit: Payer: Self-pay | Admitting: Nurse Practitioner

## 2019-10-06 DIAGNOSIS — M549 Dorsalgia, unspecified: Secondary | ICD-10-CM

## 2019-10-26 ENCOUNTER — Ambulatory Visit: Payer: Medicaid Other | Admitting: Adult Health

## 2019-10-26 ENCOUNTER — Ambulatory Visit
Admission: RE | Admit: 2019-10-26 | Discharge: 2019-10-26 | Disposition: A | Discharge status: Home / Self Care | Payer: Medicaid Other | Source: Ambulatory Visit | Attending: Nurse Practitioner | Admitting: Nurse Practitioner

## 2019-10-26 ENCOUNTER — Other Ambulatory Visit: Payer: Self-pay

## 2019-10-26 DIAGNOSIS — M549 Dorsalgia, unspecified: Secondary | ICD-10-CM | POA: Insufficient documentation

## 2019-10-26 DIAGNOSIS — G8929 Other chronic pain: Secondary | ICD-10-CM | POA: Diagnosis present

## 2019-10-27 ENCOUNTER — Ambulatory Visit
Payer: Medicaid Other | Attending: Student in an Organized Health Care Education/Training Program | Admitting: Student in an Organized Health Care Education/Training Program

## 2019-10-27 ENCOUNTER — Encounter: Payer: Self-pay | Admitting: Student in an Organized Health Care Education/Training Program

## 2019-10-27 VITALS — BP 151/77 | HR 85 | Temp 97.7°F | Resp 16 | Ht 62.0 in | Wt 200.0 lb

## 2019-10-27 DIAGNOSIS — M47816 Spondylosis without myelopathy or radiculopathy, lumbar region: Secondary | ICD-10-CM | POA: Diagnosis present

## 2019-10-27 DIAGNOSIS — Z981 Arthrodesis status: Secondary | ICD-10-CM | POA: Diagnosis present

## 2019-10-27 DIAGNOSIS — M7062 Trochanteric bursitis, left hip: Secondary | ICD-10-CM | POA: Insufficient documentation

## 2019-10-27 DIAGNOSIS — M7061 Trochanteric bursitis, right hip: Secondary | ICD-10-CM | POA: Diagnosis present

## 2019-10-27 DIAGNOSIS — F17200 Nicotine dependence, unspecified, uncomplicated: Secondary | ICD-10-CM | POA: Diagnosis present

## 2019-10-27 DIAGNOSIS — G894 Chronic pain syndrome: Secondary | ICD-10-CM | POA: Insufficient documentation

## 2019-10-27 MED ORDER — AMITRIPTYLINE HCL 25 MG PO TABS: ORAL_TABLET | ORAL | 0 refills | Status: DC

## 2019-10-27 MED ORDER — TIZANIDINE HCL 4 MG PO TABS: 4.0000 mg | ORAL_TABLET | Freq: Two times a day (BID) | ORAL | 0 refills | Status: AC | PRN

## 2019-10-27 NOTE — Progress Notes (Signed)
Patient: Margaret Blackburn  Service Category: E/M  Provider: Gillis Santa, MD  DOB: [birth year]  DOS: 10/27/2019  Referring Provider: Lonell Face, NP  MRN: [id number]  Setting: Ambulatory outpatient  PCP: Kendell Bane, NP  Type: New Patient  Specialty: Interventional Pain Management    Location: Office  Delivery: Face-to-face     Primary Reason(s) for Visit: Encounter for initial evaluation of one or more chronic problems (new to examiner) potentially causing chronic pain, and posing a threat to normal musculoskeletal function. (Level of risk: High) CC: Hip Pain (bilateral), Hand Pain (left), Leg Pain (right), Foot Pain (bilateral), and Back Pain (lower)  HPI  Ms. [name] is a 54 y.o. year old, female patient, who comes for the first time to our practice referred by Lonell Face, NP for our initial evaluation of her chronic pain. She has Mass of right breast; Invasive carcinoma of breast ([place]); Goals of care, counseling/discussion; Malignant neoplasm of right breast greater than or equal to 2 cm in greatest dimension ([place]); Respiratory tract infection due to COVID-19 virus; Cough; 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); Lumbar spondylosis; Trochanteric bursitis of both hips; and Chronic pain syndrome on their problem list. Today she comes in for evaluation of her Hip Pain (bilateral), Hand Pain (left), Leg Pain (right), Foot Pain (bilateral), and Back Pain (lower)  Pain Assessment: Location: Right, Left Hip Radiating: radiates into hips and lower back Onset: More than a month ago Duration: Chronic pain Quality: Burning, Numbness Severity: 10-Worst pain ever/10 (subjective, self-reported pain score)  Effect on ADL: unable to walk or stand prolonged Timing: Intermittent Modifying factors: nothing BP: (!) 151/77  HR: 85  Onset and Duration: Date of onset: 8 years ago Cause of pain: Unknown Severity: Getting worse, NAS-11 at its worse: 10/10, NAS-11 at its best: 7/10, NAS-11 now:  10/10 and NAS-11 on the average: 9/10 Timing: Morning and Night Aggravating Factors: Bending, Intercourse (sex), Kneeling, Lifiting, Prolonged sitting, Prolonged standing, Squatting, Stooping , Twisting, Walking, Walking uphill and Walking downhill Alleviating Factors: Lying down and Sitting Associated Problems: Depression, Fatigue, Numbness, Sadness, Spasms, Tingling and Pain that wakes patient up Quality of Pain: Aching, Agonizing, Burning, Constant, Deep, Disabling, Horrible, Pressure-like, Pulsating, Sharp, Shooting, Stabbing, Throbbing, Tingling and Uncomfortable Previous Examinations or Tests: EMG/PNCV, MRI scan, X-rays, Nerve conduction test and Neurological evaluation Previous Treatments: Steroid treatments by mouth   [name] is a pleasant 54 year old female who presents with a chief complaint of primarily low back pain that radiates into bilateral hips, right anterior thigh that is worse with weightbearing and lumbar extension.  Patient endorses numbness and tingling in her right anterior thigh.  She also endorses pain in her left hand as well as bilateral foot pain.  Of note patient has a history of T5-L1 fusion for significant scoliosis.  This was done when she was a teenager.  She had a significant Cobb angle.  She denies any respiratory issues as a child.  Patient takes Mobic and ibuprofen together.  She takes a dose of ibuprofen as high as 1200 mg daily.  She has tried gabapentin in the past without any significant benefit.  She denies having tried Lyrica, TCA, Cymbalta.  She has also tried tizanidine at 2 mg in the past.  Patient has a history of obstructive sleep apnea and has tried CPAP in the past was not helpful.  She does have Harrington rods in place for her T4-L1 fusion.  She has tried Xanax in the past nightly to help with  sleep but states that it was not effective.  She is currently on melatonin for insomnia.  Patient is a smoker, 1 pack/day.  She has had an EMG study with neurology  that showed no lumbar radiculopathy or large fiber neuropathy.  Her lumbar MRI does show lumbar spondylosis and facet arthropathy.  She is being referred here by neurosurgery to consider injections and medication therapy.  She is currently not on opioid analgesics.  She has had prior urine toxicology screens positive for THC.  Historic Controlled Substance Pharmacotherapy Review  Historical Monitoring: The patient  reports current drug use. Drug: Marijuana. List of all UDS Test(s): Lab Results  Component Value Date   MDMA NONE DETECTED 03/21/2018   MDMA NONE DETECTED 03/03/2018   COCAINSCRNUR NONE DETECTED 03/21/2018   COCAINSCRNUR NONE DETECTED 03/03/2018   PCPSCRNUR NONE DETECTED 03/21/2018   PCPSCRNUR NONE DETECTED 03/03/2018   THCU POSITIVE (A) 03/21/2018   THCU POSITIVE (A) 03/03/2018   List of other Serum/Urine Drug Screening Test(s):  Lab Results  Component Value Date   COCAINSCRNUR NONE DETECTED 03/21/2018   COCAINSCRNUR NONE DETECTED 03/03/2018   THCU POSITIVE (A) 03/21/2018   THCU POSITIVE (A) 03/03/2018   Historical Background Evaluation: [place] PMP: PDMP reviewed during this encounter. Online review of the past 41-month period conducted.             Onset Department of public safety, offender search: Editor, commissioning Information) Non-contributory Risk Assessment Profile: Aberrant behavior: None observed or detected today Risk factors for fatal opioid overdose: None identified today Fatal overdose hazard ratio (HR): Calculation deferred Non-fatal overdose hazard ratio (HR): Calculation deferred Risk of opioid abuse or dependence: 0.7-3.0% with doses ? 36 MME/day and 6.1-26% with doses ? 120 MME/day. Substance use disorder (SUD) risk level: See below Personal History of Substance Abuse (SUD-Substance use disorder):  Alcohol: Negative  Illegal Drugs: Positive Female or Female (marijuana)  Rx Drugs: Negative  ORT Risk Level calculation: High Risk  Opioid Risk Tool - 10/27/19 0907       Family History of Substance Abuse   Alcohol Positive Female    Illegal Drugs Negative    Rx Drugs Negative      Personal History of Substance Abuse   Alcohol Negative    Illegal Drugs Positive Female or Female   marijuana   Rx Drugs Negative      Age   Age between 27-45 years  No      History of Preadolescent Sexual Abuse   History of Preadolescent Sexual Abuse Negative or Female      Psychological Disease   Psychological Disease Negative    Depression Positive      Total Score   Opioid Risk Tool Scoring 8    Opioid Risk Interpretation High Risk          ORT Scoring interpretation table:  Score <3 = Low Risk for SUD  Score between 4-7 = Moderate Risk for SUD  Score >8 = High Risk for Opioid Abuse   PHQ-2 Depression Scale:  Total score: 0  PHQ-2 Scoring interpretation table: (Score and probability of major depressive disorder)  Score 0 = No depression  Score 1 = 15.4% Probability  Score 2 = 21.1% Probability  Score 3 = 38.4% Probability  Score 4 = 45.5% Probability  Score 5 = 56.4% Probability  Score 6 = 78.6% Probability   PHQ-9 Depression Scale:  Total score: 0  PHQ-9 Scoring interpretation table:  Score 0-4 = No depression  Score 5-9 =  Mild depression  Score 10-14 = Moderate depression  Score 15-19 = Moderately severe depression  Score 20-27 = Severe depression (2.4 times higher risk of SUD and 2.89 times higher risk of overuse)   Pharmacologic Plan: Ms. Nied indicated having a preference to stay away from opioid analgesics.            Initial impression: The patient indicated having no interest, at this time.  Meds   Current Outpatient Medications:  .  acetaminophen (TYLENOL) 500 MG tablet, Take 500 mg by mouth every 6 (six) hours as needed., Disp: , Rfl:  .  albuterol (VENTOLIN HFA) 108 (90 Base) MCG/ACT inhaler, Inhale 2 puffs into the lungs every 6 (six) hours as needed for wheezing or shortness of breath., Disp: 18 g, Rfl: 0 .  ALPRAZolam (XANAX) 0.25 MG  tablet, Take 1 tablet (0.25 mg total) by mouth 2 (two) times daily as needed for anxiety., Disp: 30 tablet, Rfl: 0 .  anastrozole (ARIMIDEX) 1 MG tablet, TAKE 1 TABLET BY MOUTH ONCE DAILY, Disp: 30 tablet, Rfl: 3 .  aspirin 81 MG chewable tablet, Chew 81 mg by mouth daily., Disp: , Rfl:  .  budesonide-formoterol (SYMBICORT) 160-4.5 MCG/ACT inhaler, Inhale 2 puffs into the lungs 2 (two) times daily., Disp: 1 Inhaler, Rfl: 3 .  Calcium Carb-Cholecalciferol (CALCIUM 600+D3 PO), Take 1 tablet by mouth in the morning and at bedtime., Disp: , Rfl:  .  ibuprofen (ADVIL) 800 MG tablet, Take 800 mg by mouth every 8 (eight) hours as needed for moderate pain. Patient states that when pain is severe she will take 800 - 1200 mg depending on how bad the pain is.  Sometimes it helps and makes the pain more bareable., Disp: , Rfl:  .  Melatonin 1 MG CAPS, Take 1 mg by mouth at bedtime as needed., Disp: , Rfl:  .  meloxicam (MOBIC) 15 MG tablet, Take 1 tablet (15 mg total) by mouth daily., Disp: 30 tablet, Rfl: 1 .  montelukast (SINGULAIR) 10 MG tablet, TAKE ONE TABLET BY MOUTH AT BEDTIME, Disp: 30 tablet, Rfl: 3 .  Multiple Vitamins-Minerals (ALIVE WOMENS ENERGY PO), Take 1 Dose by mouth daily., Disp: , Rfl:  .  sertraline (ZOLOFT) 100 MG tablet, Take 0.5 tablets (50 mg total) by mouth daily. (Patient taking differently: Take 100 mg by mouth daily. ), Disp: 30 tablet, Rfl: 1 .  amitriptyline (ELAVIL) 25 MG tablet, Take 1 tablet (25 mg total) by mouth at bedtime for 20 days, THEN 2 tablets (50 mg total) at bedtime for 20 days., Disp: 60 tablet, Rfl: 0 .  tiZANidine (ZANAFLEX) 4 MG tablet, Take 1 tablet (4 mg total) by mouth 2 (two) times daily as needed for muscle spasms., Disp: 60 tablet, Rfl: 0  Imaging Review  Cervical Imaging: Cervical MR wo contrast: Results for orders placed during the hospital encounter of 09/25/19  MR CERVICAL SPINE WO CONTRAST  Narrative CLINICAL DATA:  Chronic neck pain with bilateral  shoulder pain. History of scoliosis surgery as a child. Breast cancer  EXAM: MRI CERVICAL SPINE WITHOUT CONTRAST  TECHNIQUE: Multiplanar, multisequence MR imaging of the cervical spine was performed. No intravenous contrast was administered.  COMPARISON:  Thoracic spine radiograph 02/20/2019. No prior cervical spine imaging.  FINDINGS: Alignment: Normal cervical alignment.  There is cervical kyphosis  There is anterolisthesis at T2-3 approximately 1.5 mm.  Vertebrae: Normal bone marrow.  Negative for fracture or mass.  Cord: Normal spinal cord signal.  No cord lesion.  Posterior  Fossa, vertebral arteries, paraspinal tissues: Negative  Disc levels:  C2-3: Negative  C3-4: Mild facet degeneration on the right. Negative for spinal or foraminal stenosis  C4-5: Negative  C5-6: Mild disc space narrowing. Mild uncinate spurring. No significant spinal or foraminal stenosis. Mild facet degeneration.  C6-7: Small right paracentral disc protrusion without significant spinal or foraminal stenosis.  C7-T1: Negative  T1-2: Negative  T2-3: Sagittal images only. 1.5 mm anterolisthesis. Right paracentral disc protrusion. Bilateral facet degeneration. No apparent spinal or foraminal stenosis.  IMPRESSION: Mild cervical spine degenerative change without neural impingement or stenosis  Degenerative listhesis T2-3 with right paracentral disc protrusion and bilateral facet degeneration. No apparent stenosis.   Electronically Signed By: Marlan Palau M.D. On: 09/25/2019 11:44 Thoracic Imaging: Thoracic MR wo contrast: Results for orders placed during the hospital encounter of 10/26/19  MR THORACIC SPINE WO CONTRAST  Narrative CLINICAL DATA:  Pain.  Scoliosis.  EXAM: MRI THORACIC SPINE WITHOUT CONTRAST  TECHNIQUE: Multiplanar, multisequence MR imaging of the thoracic spine was performed. No intravenous contrast was administered.  COMPARISON:  Radiographs  02/20/2019  FINDINGS: Alignment: No substantial subluxation. Dextrose curvature of the thoracic spine, better characterized on prior radiographs.  Vertebrae: Limited evaluation given artifact from posterior stabilization rods. Within this limitation, no evidence of abnormal bone marrow signal or vertebral body height loss.  Cord: The spinal cord is partially obscured in the upper and lower thoracic spine. Visualized portions of the cord are unremarkable without abnormal cord signal.  Paraspinal and other soft tissues: Negative.  Disc levels:  Significantly limited evaluation given extensive metallic artifact from the patient's posterior spinal fusion hardware.  Within this limitation, no evidence of high-grade canal stenosis.  There are multiple levels of which the foramina are obscured (including the left at T3-T4 and T4-T5 in the right at multiple levels in the lower thoracic spine). Visualized levels are without advanced foraminal stenosis. Small right eccentric disc bulge contacts the right eccentric cord at T2-T3 without significant canal stenosis.  IMPRESSION: Significantly limited evaluation given extensive metallic artifact from the patient's posterior stabilization rods. Within this limitation, no evidence of significant canal stenosis in the thoracic spine. Evaluation of the foramina is particularly limited with multiple levels obscured, but visualized levels demonstrate no significant stenosis. CT myelogram could provide further evaluation if clinically indicated.   Electronically Signed By: Feliberto Harts MD On: 10/26/2019 16:02  DG Thoracic Spine 2 View  Narrative CLINICAL DATA:  Chronic low back pain. Patient now with pain radiating into right leg down to knee x1 year. Patient states that at times she will experience numbness from right hip to right knee. Patient states no known recent injuries to back. Hx of back surgery at age 72 for scoliosis,  breast cancer.  EXAM: THORACIC SPINE 2 VIEWS  COMPARISON:  Chest radiograph, 02/21/2016.  FINDINGS: No fracture, bone lesion or spondylolisthesis.  Spine stabilization rods extend from the T4-T5 level to L1. Rods appear well positioned and intact.  Moderate dextroscoliosis of the thoracic spine, apex at T8-T9.  Discs are well maintained in height. No degenerative change. No bone lesion.  Soft tissues are unremarkable.  IMPRESSION: 1. No fracture, spondylolisthesis or acute finding. 2. Well-positioned spine stabilization rods. 3. Moderate dextroscoliosis.   Electronically Signed By: Amie Portland M.D. On: 02/20/2019 10:34  Lumbosacral Imaging: Lumbar MR wo contrast: Results for orders placed during the hospital encounter of 04/17/19  MR LUMBAR SPINE WO CONTRAST  Narrative CLINICAL DATA:  Surgery 40 years ago lower back  pain bilateral hip and leg pain, history of breast cancer  EXAM: MRI LUMBAR SPINE WITHOUT CONTRAST  TECHNIQUE: Multiplanar, multisequence MR imaging of the lumbar spine was performed. No intravenous contrast was administered.  COMPARISON:  None.  FINDINGS: Segmentation: There are 5 non-rib bearing lumbar type vertebral bodies with the last intervertebral disc space labeled as L5-S1.  Alignment: There is a slight leftward curvature of the lumbar spine.  Vertebrae: The vertebral body heights are well maintained. No fracture, marrow edema,or pathologic marrow infiltration. Mildly increased STIR signal seen at the endplates of W4-O9 with fatty endplate changes. Prior posterior thoracolumbar spine fixation is seen spanning to L1.  Conus medullaris and cauda equina: There is limited visualization of the conus due to overlying metallic artifact, however likely terminates at the L1 level. Conus and cauda equina appear normal.  Paraspinal and other soft tissues: The paraspinal soft tissues and visualized retroperitoneal structures are  unremarkable. The sacroiliac joints are intact.  Disc levels:  T12-L1:  No significant canal or neural foraminal narrowing.  L1-L2:   No significant canal or neural foraminal narrowing.  L2-L3:   No significant canal or neural foraminal narrowing.  L3-L4: There is a minimal broad-based disc bulge which causes mild left neural foraminal narrowing.  L4-L5: There is a broad-based disc bulge which is asymmetric to the left with a annular fissure. Moderate left and mild right neural foraminal narrowing is seen. There is mild effacement anterior thecal sac.  L5-S1: There is a broad-based disc bulge with a left lateral recess disc protrusion which contacts the descending left S1 nerve root without impingement. There is moderate left and mild right neural foraminal narrowing.  IMPRESSION: Mild leftward curvature of the lumbar spine.  Lumbar spine spondylosis most notable at L4-L5 with a asymmetric left disc bulge with annular fissure and moderate left neural foraminal narrowing. Also at L5-S1 with a left lateral recess disc protrusion which contacts the descending left S1 nerve root without impingement. There is also moderate left neural foraminal narrowing.   Electronically Signed By: Prudencio Pair M.D. On: 04/17/2019 11:26 DG Lumbar Spine Complete  Narrative CLINICAL DATA:  Chronic low back pain. Patient now with pain radiating into right leg down to knee x1 year. Patient states that at times she will experience numbness from right hip to right knee. Patient states no known recent injuries to back. Hx of back surgery at age 70 for scoliosis, breast cancer.  EXAM: LUMBAR SPINE - COMPLETE 4+ VIEW  COMPARISON:  None.  FINDINGS: No fracture or bone lesion.  No spondylolisthesis.  Moderate loss of disc height with mild endplate sclerosis and endplate spurring at B3-Z3. Remaining lumbar discs are well preserved. Facet joints are well preserved.  Mild curvature of the  lumbar spine, apex at L4, convex to the left.  Lower aspect of spine stabilization rods extend to the L1 level.  Soft tissues are unremarkable.  IMPRESSION: 1. No fracture or acute finding. 2. Moderate disc degenerative changes at L5-S1. 3. Mild lower lumbar spine levoscoliosis. 4. Partly imaged changes from prior spine stabilization surgery.   Electronically Signed By: Lajean Manes M.D. DG Knee Complete 4 Views Right  Narrative CLINICAL DATA:  Acute medial knee pain.  No injury.  EXAM: RIGHT KNEE - COMPLETE 4+ VIEW  COMPARISON:  None.  FINDINGS: No evidence of fracture, dislocation, or joint effusion. No evidence of arthropathy or other focal bone abnormality. Soft tissues are unremarkable.  IMPRESSION: Negative.   Electronically Signed By: Titus Dubin  M.D. On: 11/11/2017 12:02    Complexity Note: Imaging results reviewed. Results shared with Ms. Manter, using Layman's terms.                         ROS  Cardiovascular: Daily Aspirin intake Pulmonary or Respiratory: Lung problems, Wheezing and difficulty taking a deep full breath (Asthma), Shortness of breath, Smoking, Snoring , Coughing up mucus (Bronchitis) and Temporary stoppage of breathing during sleep Neurological: Curved spine Psychological-Psychiatric: Anxiousness, Depressed and Prone to panicking Gastrointestinal: No reported gastrointestinal signs or symptoms such as vomiting or evacuating blood, reflux, heartburn, alternating episodes of diarrhea and constipation, inflamed or scarred liver, or pancreas or irrregular and/or infrequent bowel movements Genitourinary: No reported renal or genitourinary signs or symptoms such as difficulty voiding or producing urine, peeing blood, non-functioning kidney, kidney stones, difficulty emptying the bladder, difficulty controlling the flow of urine, or chronic kidney disease Hematological: No reported hematological signs or symptoms such as prolonged bleeding,  low or poor functioning platelets, bruising or bleeding easily, hereditary bleeding problems, low energy levels due to low hemoglobin or being anemic Endocrine: No reported endocrine signs or symptoms such as high or low blood sugar, rapid heart rate due to high thyroid levels, obesity or weight gain due to slow thyroid or thyroid disease Rheumatologic: No reported rheumatological signs and symptoms such as fatigue, joint pain, tenderness, swelling, redness, heat, stiffness, decreased range of motion, with or without associated rash Musculoskeletal: Negative for myasthenia gravis, muscular dystrophy, multiple sclerosis or malignant hyperthermia Work History: Quit going to work on his/her own  Allergies  Ms. Chieffo is allergic to penicillins.  Laboratory Chemistry Profile   Renal Lab Results  Component Value Date   BUN 14 08/25/2019   CREATININE 0.65 08/25/2019   BCR 20 03/06/2019   GFRAA >60 08/25/2019   GFRNONAA >60 08/25/2019   SPECGRAV 1.021 03/12/2019   PHUR 5.5 03/12/2019   PROTEINUR Negative 03/12/2019     Electrolytes Lab Results  Component Value Date   NA 139 08/25/2019   K 4.1 08/25/2019   CL 105 08/25/2019   CALCIUM 8.7 (L) 08/25/2019     Hepatic Lab Results  Component Value Date   AST 15 08/25/2019   ALT 20 08/25/2019   ALBUMIN 4.2 08/25/2019   ALKPHOS 59 08/25/2019     ID Lab Results  Component Value Date   SARSCOV2NAA NEGATIVE 08/27/2019     Bone Lab Results  Component Value Date   VD25OH 20.3 (L) 03/06/2019   VD125OH2TOT 26 04/22/2018   [id number] 24 04/22/2018   [id number] <10 04/22/2018     Endocrine Lab Results  Component Value Date   GLUCOSE 119 (H) 08/25/2019   GLUCOSEU Negative 03/12/2019   HGBA1C 5.8 (H) 03/06/2019   TSH 0.525 03/06/2019   FREET4 1.10 03/06/2019     Neuropathy Lab Results  Component Value Date   VITAMINB12 347 03/06/2019   FOLATE 8.8 03/06/2019   HGBA1C 5.8 (H) 03/06/2019     CNS No results found for: COLORCSF,  APPEARCSF, RBCCOUNTCSF, WBCCSF, POLYSCSF, LYMPHSCSF, EOSCSF, PROTEINCSF, GLUCCSF, JCVIRUS, CSFOLI, IGGCSF, LABACHR, ACETBL, LABACHR, ACETBL   Inflammation (CRP: Acute  ESR: Chronic) No results found for: CRP, ESRSEDRATE, LATICACIDVEN   Rheumatology No results found for: RF, ANA, LABURIC, URICUR, LYMEIGGIGMAB, LYMEABIGMQN, HLAB27   Coagulation Lab Results  Component Value Date   PLT 320 03/06/2019     Cardiovascular Lab Results  Component Value Date   TROPONINI <0.03 02/24/2016   HGB  13.7 03/06/2019   HCT 40.9 03/06/2019     Screening Lab Results  Component Value Date   SARSCOV2NAA NEGATIVE 08/27/2019     Cancer No results found for: CEA, CA125, LABCA2   Allergens No results found for: ALMOND, APPLE, ASPARAGUS, AVOCADO, BANANA, BARLEY, BASIL, BAYLEAF, GREENBEAN, LIMABEAN, WHITEBEAN, BEEFIGE, REDBEET, BLUEBERRY, BROCCOLI, CABBAGE, MELON, CARROT, CASEIN, CASHEWNUT, CAULIFLOWER, CELERY     Note: Lab results reviewed.  [place]  Drug: Ms. Durfey  reports current drug use. Drug: Marijuana. Alcohol:  reports no history of alcohol use. Tobacco:  reports that she has been smoking cigarettes. She has a 30.00 pack-year smoking history. She has never used smokeless tobacco. Medical:  has a past medical history of Allergy, Arthritis, Breast cancer ([place]) (12/2017), COPD (chronic obstructive pulmonary disease) ([place]), Depression, Personal history of radiation therapy, Prediabetes, and Sleep apnea. Family: family history includes Breast cancer in her maternal grandmother; Breast cancer (age of onset: 42) in her mother; Lung cancer in her mother; Thyroid cancer in her father.  Past Surgical History:  Procedure Laterality Date  . ABDOMINAL HYSTERECTOMY    . BACK SURGERY    . BREAST BIOPSY Right 12/25/2017   affirm bx , x clip,TUBULAR  CARCINOMA  . 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 BREAST LUMPECTOMY WITH NEEDLE LOCALIZATION Right 03/03/2018   Procedure: BREAST  LUMPECTOMY WITH NEEDLE LOCALIZATION;  Surgeon: Vickie Epley, MD;  Location: [facility] ORS;  Service: General;  Laterality: Right;  . COLONOSCOPY WITH PROPOFOL N/A 08/31/2019   Procedure: COLONOSCOPY WITH PROPOFOL;  Surgeon: Jonathon Bellows, MD;  Location: [facility] ENDOSCOPY;  Service: Gastroenterology;  Laterality: N/A;  . SENTINEL NODE BIOPSY Right 03/21/2018   Procedure: RIGHT SENTINEL LYMPH  NODE BIOPSY;  Surgeon: Vickie Epley, MD;  Location: [facility] ORS;  Service: General;  Laterality: Right;   Active Ambulatory Problems    Diagnosis Date Noted  . Mass of right breast 02/24/2018  . Invasive carcinoma of breast ([place]) 03/24/2018  . Goals of care, counseling/discussion 03/24/2018  . Malignant neoplasm of right breast greater than or equal to 2 cm in greatest dimension ([place])   . Respiratory tract infection due to COVID-19 virus 03/27/2019  . Cough 03/27/2019  . 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) 10/27/2019  . Lumbar spondylosis 10/27/2019  . Trochanteric bursitis of both hips 10/27/2019  . Chronic pain syndrome 10/27/2019   Resolved Ambulatory Problems    Diagnosis Date Noted  . No Resolved Ambulatory Problems   Past Medical History:  Diagnosis Date  . Allergy   . Arthritis   . Breast cancer ([place]) 12/2017  . COPD (chronic obstructive pulmonary disease) ([place])   . Depression   . Personal history of radiation therapy   . Prediabetes   . Sleep apnea    Constitutional Exam  General appearance: Well nourished, well developed, and well hydrated. In no apparent acute distress Vitals:   10/27/19 0902  BP: (!) 151/77  Pulse: 85  Resp: 16  Temp: 97.7 F (36.5 C)  TempSrc: Temporal  SpO2: 100%  Weight: 200 lb (90.7 kg)  Height: $Remove'5\' 2"'KqvVxjt$  (1.575 m)   BMI Assessment: Estimated body mass index is 36.58 kg/m as calculated from the following:   Height as of this encounter: $RemoveBeforeD'5\' 2"'BdnyUKmmpjwUxp$  (1.575 m).   Weight as of this encounter: 200 lb (90.7 kg).  BMI interpretation table: BMI level  Category Range association with higher incidence of chronic pain  <18 kg/m2 Underweight   18.5-24.9 kg/m2 Ideal body  weight   25-29.9 kg/m2 Overweight Increased incidence by 20%  30-34.9 kg/m2 Obese (Class I) Increased incidence by 68%  35-39.9 kg/m2 Severe obesity (Class II) Increased incidence by 136%  >40 kg/m2 Extreme obesity (Class III) Increased incidence by 254%   Patient's current BMI Ideal Body weight  Body mass index is 36.58 kg/m. Ideal body weight: 50.1 kg (110 lb 7.2 oz) Adjusted ideal body weight: 66.3 kg (146 lb 4.3 oz)   BMI Readings from Last 4 Encounters:  10/27/19 36.58 kg/m  09/28/19 36.91 kg/m  09/07/19 36.76 kg/m  08/31/19 36.95 kg/m   Wt Readings from Last 4 Encounters:  10/27/19 200 lb (90.7 kg)  09/28/19 201 lb 12.8 oz (91.5 kg)  09/07/19 201 lb (91.2 kg)  08/31/19 202 lb (91.6 kg)    Psych/Mental status: Alert, oriented x 3 (person, place, & time)       Eyes: PERLA Respiratory: No evidence of acute respiratory distress  Cervical Spine Exam  Skin & Axial Inspection: No masses, redness, edema, swelling, or associated skin lesions Alignment: Symmetrical Functional ROM: Unrestricted ROM      Stability: No instability detected Muscle Tone/Strength: Functionally intact. No obvious neuro-muscular anomalies detected. Sensory (Neurological): Unimpaired Palpation: No palpable anomalies              Upper Extremity (UE) Exam    Side: Right upper extremity  Side: Left upper extremity  Skin & Extremity Inspection: Skin color, temperature, and hair growth are WNL. No peripheral edema or cyanosis. No masses, redness, swelling, asymmetry, or associated skin lesions. No contractures.  Skin & Extremity Inspection: Skin color, temperature, and hair growth are WNL. No peripheral edema or cyanosis. No masses, redness, swelling, asymmetry, or associated skin lesions. No contractures.  Functional ROM: Unrestricted ROM          Functional ROM: Unrestricted ROM           Muscle Tone/Strength: Functionally intact. No obvious neuro-muscular anomalies detected.   Muscle Tone/Strength: Functionally intact. No obvious neuro-muscular anomalies detected.  Sensory (Neurological): Musculoskeletal pain pattern hand          Sensory (Neurological): Musculoskeletal pain pattern hand          Palpation: No palpable anomalies              Palpation: No palpable anomalies              Provocative Test(s):  Phalen's test: deferred Tinel's test: deferred Apley's scratch test (touch opposite shoulder):  Action 1 (Across chest): Decreased ROM Action 2 (Overhead): Decreased ROM Action 3 (LB reach): Decreased ROM   Provocative Test(s):  Phalen's test: deferred Tinel's test: deferred Apley's scratch test (touch opposite shoulder):  Action 1 (Across chest): Decreased ROM Action 2 (Overhead): Decreased ROM Action 3 (LB reach): Decreased ROM    Thoracic Spine Area Exam  Skin & Axial Inspection: Well healed scar from previous spine surgery detected T4-L1 fusion Alignment: Symmetrical Functional ROM: Pain restricted ROM Stability: No instability detected Muscle Tone/Strength: Functionally intact. No obvious neuro-muscular anomalies detected. Sensory (Neurological): Musculoskeletal pain pattern Muscle strength & Tone: No palpable anomalies  Lumbar Exam  Skin & Axial Inspection: Well healed scar from previous spine surgery detected Alignment: Symmetrical Functional ROM: Pain restricted ROM affecting both sides Stability: No instability detected Muscle Tone/Strength: Functionally intact. No obvious neuro-muscular anomalies detected. Sensory (Neurological): Musculoskeletal pain pattern Palpation: No palpable anomalies       Provocative Tests: Hyperextension/rotation test: (+) bilaterally for facet joint pain. Lumbar quadrant test (  Kemp's test): (+) bilaterally for facet joint pain. Lateral bending test: (+) due to fusion restriction. Patrick's Maneuver: (+) for bilateral  S-I arthralgia             FABER* test: deferred today                   S-I anterior distraction/compression test: deferred today         S-I lateral compression test: deferred today         S-I Thigh-thrust test: deferred today         S-I Gaenslen's test: deferred today         *(Flexion, ABduction and External Rotation)  Gait & Posture Assessment  Ambulation: Unassisted Gait: Relatively normal for age and body habitus Posture: WNL   Lower Extremity Exam    Side: Right lower extremity  Side: Left lower extremity  Stability: No instability observed          Stability: No instability observed          Skin & Extremity Inspection: Skin color, temperature, and hair growth are WNL. No peripheral edema or cyanosis. No masses, redness, swelling, asymmetry, or associated skin lesions. No contractures.  Skin & Extremity Inspection: Skin color, temperature, and hair growth are WNL. No peripheral edema or cyanosis. No masses, redness, swelling, asymmetry, or associated skin lesions. No contractures.  Functional ROM: Pain restricted ROM for hip and knee joints          Functional ROM: Pain restricted ROM for hip and knee joints          Muscle Tone/Strength: Functionally intact. No obvious neuro-muscular anomalies detected.  Muscle Tone/Strength: Functionally intact. No obvious neuro-muscular anomalies detected.  Sensory (Neurological): Musculoskeletal pain pattern        Sensory (Neurological): Musculoskeletal pain pattern        DTR: Patellar: deferred today Achilles: deferred today Plantar: deferred today  DTR: Patellar: deferred today Achilles: deferred today Plantar: deferred today  Palpation: No palpable anomalies  Palpation: No palpable anomalies   Assessment  Primary Diagnosis & Pertinent Problem List: The primary encounter diagnosis was 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). Diagnoses of Lumbar facet arthropathy, Lumbar spondylosis, Trochanteric bursitis of both hips, and  Chronic pain syndrome were also pertinent to this visit.  Visit Diagnosis (New problems to examiner): 1. 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)   2. Lumbar facet arthropathy   3. Lumbar spondylosis   4. Trochanteric bursitis of both hips   5. Chronic pain syndrome    Plan of Care (Initial workup plan)  Note: Ms. Albright was reminded that as per protocol, today's visit has been an evaluation only. We have not taken over the patient's controlled substance management.  Patient is low back pain with radiation into bilateral hips seems to be related to lumbar facet arthropathy and spondylosis.  MRI evidence for facet joint disease at L4-L5, L5-S1.  Patient has not tried physical therapy in over 2 years.  Recommend physical therapy regimen that patient can continue at home.  For her insomnia and chronic pain syndrome, recommend amitriptyline 25 mg nightly and then titrate to 50 mg at night.  Patient's previous EKG in 2020 showed QTC less than 450 ms.  For her musculoskeletal spasms that do radiate from her lumbar spine anteriorly to her abdomen.  Patient can take Zanaflex 4 mg as needed twice daily.  Avoid opioid therapy at this time given use of marijuana and as needed  Xanax.  Future considerations could include diagnostic lumbar facet medial branch nerve blocks, lumbar RFA, Sprint peripheral medial branch nerve stimulation  Pharmacotherapy (current): Medications ordered:  Meds ordered this encounter  Medications  . amitriptyline (ELAVIL) 25 MG tablet    Sig: Take 1 tablet (25 mg total) by mouth at bedtime for 20 days, THEN 2 tablets (50 mg total) at bedtime for 20 days.    Dispense:  60 tablet    Refill:  0  . tiZANidine (ZANAFLEX) 4 MG tablet    Sig: Take 1 tablet (4 mg total) by mouth 2 (two) times daily as needed for muscle spasms.    Dispense:  60 tablet    Refill:  0    Do not place this medication, or any other prescription from our practice, on "Automatic Refill". Patient may have  prescription filled one day early if pharmacy is closed on scheduled refill date.   Medications administered during this visit: Shardee L. Tuccillo had no medications administered during this visit.   Pharmacological management options:  Opioid Analgesics: Avoid, patient utilizes THC  Membrane stabilizer: Tried and failed gabapentin.  Trial of amitriptyline.  Future considerations include Lyrica, Cymbalta  Muscle relaxant: Trial of tizanidine.  Consider Robaxin, Flexeril in future  NSAID: Encourage patient to only utilize mono NSAID therapy, ibuprofen or Mobic.  Patient prefers to incorporate ibuprofen as a part of her multimodal med regimen.  Other analgesic(s): Consider TENS unit, topicals   Interventional management options: Ms. Higley was informed that there is no guarantee that she would be a candidate for interventional therapies. The decision will be based on the results of diagnostic studies, as well as Ms. Essman's risk profile.  Procedure(s) under consideration:  Lumbar facet medial branch nerve block, lumbar radiofrequency ablation, Sprint peripheral nerve stimulation of lumbar medial branch   Provider-requested follow-up: Return in about 6 weeks (around 12/08/2019) for Medication Management, in person.  Future Appointments  Date Time Provider [place]  11/09/2019 11:15 AM Allyne Gee, MD [facility] None  12/08/2019  9:40 AM Gillis Santa, MD [facility] None  02/25/2020  2:30 PM Sindy Guadeloupe, MD [facility] None  03/14/2020  8:30 AM Kendell Bane, NP [facility] None    Note by: Gillis Santa, MD Date: 10/27/2019; Time: 10:03 AM

## 2019-10-27 NOTE — Progress Notes (Signed)
Safety precautions to be maintained throughout the outpatient stay will include: orient to surroundings, keep bed in low position, maintain call bell within reach at all times, provide assistance with transfer out of bed and ambulation.  

## 2019-10-29 ENCOUNTER — Other Ambulatory Visit: Payer: Self-pay

## 2019-10-29 ENCOUNTER — Telehealth: Payer: Self-pay

## 2019-10-29 ENCOUNTER — Encounter: Payer: Self-pay | Admitting: Oncology

## 2019-10-29 DIAGNOSIS — Z0000 Encounter for general adult medical examination without abnormal findings: Secondary | ICD-10-CM

## 2019-10-29 NOTE — Telephone Encounter (Signed)
Called patient to let her know her orders for her mammogram is in and she can call to scheduled for October 21

## 2019-11-09 ENCOUNTER — Ambulatory Visit: Payer: Medicaid Other | Admitting: Internal Medicine

## 2019-11-11 ENCOUNTER — Other Ambulatory Visit: Payer: Self-pay | Admitting: Oncology

## 2019-11-12 ENCOUNTER — Ambulatory Visit: Payer: Medicaid Other | Admitting: Internal Medicine

## 2019-11-25 ENCOUNTER — Ambulatory Visit: Payer: Medicaid Other | Admitting: Physical Therapy

## 2019-12-03 ENCOUNTER — Other Ambulatory Visit: Payer: Self-pay

## 2019-12-03 ENCOUNTER — Other Ambulatory Visit: Payer: Self-pay | Admitting: Student in an Organized Health Care Education/Training Program

## 2019-12-03 ENCOUNTER — Encounter: Payer: Self-pay | Admitting: Internal Medicine

## 2019-12-03 ENCOUNTER — Ambulatory Visit (INDEPENDENT_AMBULATORY_CARE_PROVIDER_SITE_OTHER): Payer: Medicaid Other | Admitting: Internal Medicine

## 2019-12-03 ENCOUNTER — Ambulatory Visit: Payer: Medicaid Other | Admitting: Internal Medicine

## 2019-12-03 ENCOUNTER — Telehealth: Payer: Self-pay

## 2019-12-03 DIAGNOSIS — Z9989 Dependence on other enabling machines and devices: Secondary | ICD-10-CM | POA: Diagnosis not present

## 2019-12-03 DIAGNOSIS — G4733 Obstructive sleep apnea (adult) (pediatric): Secondary | ICD-10-CM | POA: Diagnosis not present

## 2019-12-03 DIAGNOSIS — R062 Wheezing: Secondary | ICD-10-CM

## 2019-12-03 DIAGNOSIS — J449 Chronic obstructive pulmonary disease, unspecified: Secondary | ICD-10-CM

## 2019-12-03 DIAGNOSIS — Z9109 Other allergy status, other than to drugs and biological substances: Secondary | ICD-10-CM | POA: Diagnosis not present

## 2019-12-03 MED ORDER — BUDESONIDE-FORMOTEROL FUMARATE 160-4.5 MCG/ACT IN AERO: 2.0000 | INHALATION_SPRAY | Freq: Two times a day (BID) | RESPIRATORY_TRACT | 3 refills | Status: DC

## 2019-12-03 MED ORDER — ALBUTEROL SULFATE HFA 108 (90 BASE) MCG/ACT IN AERS: 2.0000 | INHALATION_SPRAY | Freq: Four times a day (QID) | RESPIRATORY_TRACT | 0 refills | Status: DC | PRN

## 2019-12-03 MED ORDER — AZITHROMYCIN 250 MG PO TABS: ORAL_TABLET | ORAL | 0 refills | Status: DC

## 2019-12-03 MED ORDER — MONTELUKAST SODIUM 10 MG PO TABS: 10.0000 mg | ORAL_TABLET | Freq: Every day | ORAL | 3 refills | Status: DC

## 2019-12-03 NOTE — Progress Notes (Signed)
[facility] [place], [place] [address]  Internal MEDICINE  Telephone Visit  Patient Name: Margaret Blackburn  [id number]  [id number]  Date of Service: 12/07/2019  I connected with the patient at 1159 by telephone and verified the patients identity using two identifiers.   I discussed the limitations, risks, security and privacy concerns of performing an evaluation and management service by telephone and the availability of in person appointments. I also discussed with the patient that there may be a patient responsible charge related to the service.  The patient expressed understanding and agrees to proceed.    Chief Complaint  Patient presents with  . Telephone Assessment    [id number]  . Telephone Screen  . COPD  . Sleep Apnea  . Cough    HPI Patient is here for routine pulmonary follow-up Unable to wear CPAP--having issues with wearing mask and having leaks Followed for allergies as well as COPD Complains today of 3 days of congestion, sinus pain and pressure as well as shortness of breath--has remained afebrile Also requesting refills of her inhalers today   Current Medication: Outpatient Encounter Medications as of 12/03/2019  Medication Sig Note  . acetaminophen (TYLENOL) 500 MG tablet Take 500 mg by mouth every 6 (six) hours as needed.   Marland Kitchen albuterol (VENTOLIN HFA) 108 (90 Base) MCG/ACT inhaler Inhale 2 puffs into the lungs every 6 (six) hours as needed for wheezing or shortness of breath.   . ALPRAZolam (XANAX) 0.25 MG tablet Take 1 tablet (0.25 mg total) by mouth 2 (two) times daily as needed for anxiety.   Marland Kitchen amitriptyline (ELAVIL) 25 MG tablet Take 1 tablet (25 mg total) by mouth at bedtime for 20 days, THEN 2 tablets (50 mg total) at bedtime for 20 days.   Marland Kitchen anastrozole (ARIMIDEX) 1 MG tablet TAKE 1 TABLET BY MOUTH ONCE DAILY   . aspirin 81 MG chewable tablet Chew 81 mg by mouth daily.   . budesonide-formoterol (SYMBICORT) 160-4.5 MCG/ACT  inhaler Inhale 2 puffs into the lungs 2 (two) times daily.   . Calcium Carb-Cholecalciferol (CALCIUM 600+D3 PO) Take 1 tablet by mouth in the morning and at bedtime. 08/25/2019: Pt does not know dose of vit d 3  . ibuprofen (ADVIL) 800 MG tablet Take 800 mg by mouth every 8 (eight) hours as needed for moderate pain. Patient states that when pain is severe she will take 800 - 1200 mg depending on how bad the pain is.  Sometimes it helps and makes the pain more bareable.   . Melatonin 1 MG CAPS Take 1 mg by mouth at bedtime as needed.   . meloxicam (MOBIC) 15 MG tablet Take 1 tablet (15 mg total) by mouth daily.   . montelukast (SINGULAIR) 10 MG tablet Take 1 tablet (10 mg total) by mouth at bedtime.   . Multiple Vitamins-Minerals (ALIVE WOMENS ENERGY PO) Take 1 Dose by mouth daily.   . sertraline (ZOLOFT) 100 MG tablet Take 0.5 tablets (50 mg total) by mouth daily. (Patient taking differently: Take 100 mg by mouth daily. )   . [DISCONTINUED] albuterol (VENTOLIN HFA) 108 (90 Base) MCG/ACT inhaler Inhale 2 puffs into the lungs every 6 (six) hours as needed for wheezing or shortness of breath.   . [DISCONTINUED] budesonide-formoterol (SYMBICORT) 160-4.5 MCG/ACT inhaler Inhale 2 puffs into the lungs 2 (two) times daily.   . [DISCONTINUED] montelukast (SINGULAIR) 10 MG tablet TAKE ONE TABLET BY MOUTH AT BEDTIME   . azithromycin (ZITHROMAX Z-PAK) 250  MG tablet Take two 250 mg tablets on day one followed by one 250 mg tablet each day for four days.    No facility-administered encounter medications on file as of 12/03/2019.    Surgical History: Past Surgical History:  Procedure Laterality Date  . ABDOMINAL HYSTERECTOMY    . BACK SURGERY    . BREAST BIOPSY Right 12/25/2017   affirm bx , x clip,TUBULAR  CARCINOMA  . 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 BREAST LUMPECTOMY WITH NEEDLE LOCALIZATION Right 03/03/2018   Procedure: BREAST LUMPECTOMY WITH NEEDLE LOCALIZATION;  Surgeon:  Vickie Epley, MD;  Location: [facility] OR[facility];  Service: General;  Laterality: Right;  . COLONOSCOPY WITH PROPOFOL N/A 08/31/2019   Procedure: COLONOSCOPY WITH PROPOFOL;  Surgeon: Jonathon Bellows, MD;  Location: [facility] ENDOSCOPY;  Service: Gastroenterology;  Laterality: N/A;  . SENTINEL NODE BIOPSY Right 03/21/2018   Procedure: RIGHT SENTINEL LYMPH  NODE BIOPSY;  Surgeon: Vickie Epley, MD;  Location: [facility] ORS;  Service: General;  Laterality: Right;    Medical History: Past Medical History:  Diagnosis Date  . Allergy   . Arthritis   . Breast cancer ([place]) 12/2017   right breast  . COPD (chronic obstructive pulmonary disease) ([place])   . Depression   . Personal history of radiation therapy   . Prediabetes   . Sleep apnea    supposed to get CPAP 03/25/19    Family History: Family History  Problem Relation Age of Onset  . Breast cancer Mother 24  . Lung cancer Mother   . Breast cancer Maternal Grandmother   . Thyroid cancer Father     Social History   Socioeconomic History  . Marital status: Single    Spouse name: Not on file  . Number of children: 2  . Years of education: Not on file  . Highest education level: Not on file  Occupational History  . Not on file  Tobacco Use  . Smoking status: Current Every Day Smoker    Packs/day: 1.00    Years: 30.00    Pack years: 30.00    Types: Cigarettes  . Smokeless tobacco: Never Used  . Tobacco comment: in the process of trying to quit-chantix  Vaping Use  . Vaping Use: Former  . Devices: used  2 weeks per pt  Substance and Sexual Activity  . Alcohol use: No  . Drug use: Yes    Types: Marijuana    Comment: occ  . Sexual activity: Yes  Other Topics Concern  . Not on file  Social History Narrative  . Not on file   Social Determinants of Health   Financial Resource Strain:   . Difficulty of Paying Living Expenses: Not on file  Food Insecurity:   . Worried About Charity fundraiser in the Last Year: Not on file  . Ran Out of  Food in the Last Year: Not on file  Transportation Needs:   . Lack of Transportation (Medical): Not on file  . Lack of Transportation (Non-Medical): Not on file  Physical Activity:   . Days of Exercise per Week: Not on file  . Minutes of Exercise per Session: Not on file  Stress:   . Feeling of Stress : Not on file  Social Connections:   . Frequency of Communication with Friends and Family: Not on file  . Frequency of Social Gatherings with Friends and Family: Not on file  . Attends Religious Services: Not on file  . Active Member  of Clubs or Organizations: Not on file  . Attends Archivist Meetings: Not on file  . Marital Status: Not on file  Intimate Partner Violence:   . Fear of Current or Ex-Partner: Not on file  . Emotionally Abused: Not on file  . Physically Abused: Not on file  . Sexually Abused: Not on file   Review of Systems  Constitutional: Negative for chills, diaphoresis and fatigue.  HENT: Positive for congestion, sinus pressure and sinus pain. Negative for ear pain and postnasal drip.   Eyes: Negative for photophobia, discharge, redness, itching and visual disturbance.  Respiratory: Positive for cough and shortness of breath. Negative for wheezing.   Cardiovascular: Negative for chest pain, palpitations and leg swelling.  Gastrointestinal: Negative for abdominal pain, constipation, diarrhea, nausea and vomiting.  Genitourinary: Negative for dysuria and flank pain.  Musculoskeletal: Negative for arthralgias, back pain, gait problem and neck pain.  Skin: Negative for color change.  Allergic/Immunologic: Negative for environmental allergies and food allergies.  Neurological: Negative for dizziness and headaches.  Hematological: Does not bruise/bleed easily.  Psychiatric/Behavioral: Negative for agitation, behavioral problems (depression) and hallucinations.    Vital Signs: Ht 5\' 2"  (1.575 m)   Wt 200 lb (90.7 kg)   BMI 36.58 kg/m     Observation/Objective: Coughing, no acute distress noted  Assessment/Plan: 1. Chronic obstructive pulmonary disease, unspecified COPD type ([place]) Will treat mild, acute exacerbation with ZPack--advised to contact office if symptoms worsen or have not resolved in 10 days Requesting refills of inhalers today Will get updated PFT after acute illness resolved - albuterol (VENTOLIN HFA) 108 (90 Base) MCG/ACT inhaler; Inhale 2 puffs into the lungs every 6 (six) hours as needed for wheezing or shortness of breath.  Dispense: 18 g; Refill: 0 - budesonide-formoterol (SYMBICORT) 160-4.5 MCG/ACT inhaler; Inhale 2 puffs into the lungs 2 (two) times daily.  Dispense: 6 g; Refill: 3 - azithromycin (ZITHROMAX Z-PAK) 250 MG tablet; Take two 250 mg tablets on day one followed by one 250 mg tablet each day for four days.  Dispense: 6 each; Refill: 0 - Pulmonary function test; Future  2. Environmental allergies Stable at this time--requesting refills today - montelukast (SINGULAIR) 10 MG tablet; Take 1 tablet (10 mg total) by mouth at bedtime.  Dispense: 30 tablet; Refill: 3  3. OSA on CPAP Will need to set up for download and assessment of mask and leak pressures Advised to continue with wearing CPAP as tolerated  General Counseling: Larene Beach verbalizes understanding of the findings of today's phone visit and agrees with plan of treatment. I have discussed any further diagnostic evaluation that may be needed or ordered today. We also reviewed her medications today. she has been encouraged to call the office with any questions or concerns that should arise related to todays visit.    Orders Placed This Encounter  Procedures  . Pulmonary function test    Meds ordered this encounter  Medications  . albuterol (VENTOLIN HFA) 108 (90 Base) MCG/ACT inhaler    Sig: Inhale 2 puffs into the lungs every 6 (six) hours as needed for wheezing or shortness of breath.    Dispense:  18 g    Refill:  0  .  budesonide-formoterol (SYMBICORT) 160-4.5 MCG/ACT inhaler    Sig: Inhale 2 puffs into the lungs 2 (two) times daily.    Dispense:  6 g    Refill:  3  . montelukast (SINGULAIR) 10 MG tablet    Sig: Take 1 tablet (10 mg total)  by mouth at bedtime.    Dispense:  30 tablet    Refill:  3  . azithromycin (ZITHROMAX Z-PAK) 250 MG tablet    Sig: Take two 250 mg tablets on day one followed by one 250 mg tablet each day for four days.    Dispense:  6 each    Refill:  0     Time spent: [place]. Levie Wages AGNP-C Internal medicine

## 2019-12-03 NOTE — Telephone Encounter (Signed)
Mailed pt after summary visit with future appointments. Margaret Blackburn

## 2019-12-07 ENCOUNTER — Encounter: Payer: Self-pay | Admitting: Internal Medicine

## 2019-12-07 ENCOUNTER — Encounter: Payer: Medicaid Other | Admitting: Physical Therapy

## 2019-12-07 NOTE — Patient Instructions (Signed)

## 2019-12-08 ENCOUNTER — Encounter: Payer: Self-pay | Admitting: Student in an Organized Health Care Education/Training Program

## 2019-12-08 ENCOUNTER — Ambulatory Visit
Payer: Medicaid Other | Attending: Student in an Organized Health Care Education/Training Program | Admitting: Student in an Organized Health Care Education/Training Program

## 2019-12-08 ENCOUNTER — Other Ambulatory Visit: Payer: Self-pay

## 2019-12-08 VITALS — BP 146/77 | HR 110 | Temp 97.2°F | Resp 22 | Ht 62.0 in | Wt 200.0 lb

## 2019-12-08 DIAGNOSIS — M7061 Trochanteric bursitis, right hip: Secondary | ICD-10-CM | POA: Diagnosis not present

## 2019-12-08 DIAGNOSIS — M5416 Radiculopathy, lumbar region: Secondary | ICD-10-CM | POA: Insufficient documentation

## 2019-12-08 DIAGNOSIS — Z981 Arthrodesis status: Secondary | ICD-10-CM | POA: Diagnosis present

## 2019-12-08 DIAGNOSIS — M48061 Spinal stenosis, lumbar region without neurogenic claudication: Secondary | ICD-10-CM | POA: Insufficient documentation

## 2019-12-08 DIAGNOSIS — G894 Chronic pain syndrome: Secondary | ICD-10-CM | POA: Diagnosis not present

## 2019-12-08 DIAGNOSIS — M47816 Spondylosis without myelopathy or radiculopathy, lumbar region: Secondary | ICD-10-CM | POA: Insufficient documentation

## 2019-12-08 DIAGNOSIS — M7062 Trochanteric bursitis, left hip: Secondary | ICD-10-CM | POA: Insufficient documentation

## 2019-12-08 MED ORDER — CELECOXIB 100 MG PO CAPS
100.0000 mg | ORAL_CAPSULE | Freq: Two times a day (BID) | ORAL | 2 refills | Status: AC
Start: 2019-12-08 — End: 2020-03-07

## 2019-12-08 NOTE — Progress Notes (Signed)
Safety precautions to be maintained throughout the outpatient stay will include: orient to surroundings, keep bed in low position, maintain call bell within reach at all times, provide assistance with transfer out of bed and ambulation.  

## 2019-12-08 NOTE — Patient Instructions (Signed)
Preparing for your procedure (without sedation) Instructions: . Oral Intake: Do not eat or drink anything for at least 3 hours prior to your procedure. . Transportation: Unless otherwise stated by your physician, you may drive yourself after the procedure. . Blood Pressure Medicine: Take your blood pressure medicine with a sip of water the morning of the procedure. . Insulin: Take only  of your normal insulin dose. . Preventing infections: Shower with an antibacterial soap the morning of your procedure. . Build-up your immune system: Take 1000 mg of Vitamin C with every meal (3 times a day) the day prior to your procedure. . Pregnancy: If you are pregnant, call and cancel the procedure. . Sickness: If you have a cold, fever, or any active infections, call and cancel the procedure. . Arrival: You must be in the facility at least 30 minutes prior to your scheduled procedure. . Children: Do not bring any children with you. . Dress appropriately: Bring dark clothing that you would not mind if they get stained. . Valuables: Do not bring any jewelry or valuables. Procedure appointments are reserved for interventional treatments only. . No Prescription Refills. . No medication changes will be discussed during procedure appointments. No disability issues will be discussed.Epidural Steroid Injection Patient Information  Description: The epidural space surrounds the nerves as they exit the spinal cord.  In some patients, the nerves can be compressed and inflamed by a bulging disc or a tight spinal canal (spinal stenosis).  By injecting steroids into the epidural space, we can bring irritated nerves into direct contact with a potentially helpful medication.  These steroids act directly on the irritated nerves and can reduce swelling and inflammation which often leads to decreased pain.  Epidural steroids may be injected anywhere along the spine and from the neck to the low back depending upon the location  of your pain.   After numbing the skin with local anesthetic (like Novocaine), a small needle is passed into the epidural space slowly.  You may experience a sensation of pressure while this is being done.  The entire block usually last less than 10 minutes.  Conditions which may be treated by epidural steroids:  Low back and leg pain Neck and arm pain Spinal stenosis Post-laminectomy syndrome Herpes zoster (shingles) pain Pain from compression fractures  Preparation for the injection:  Do not eat any solid food or dairy products within 8 hours of your appointment.  You may drink clear liquids up to 3 hours before appointment.  Clear liquids include water, black coffee, juice or soda.  No milk or cream please. You may take your regular medication, including pain medications, with a sip of water before your appointment  Diabetics should hold regular insulin (if taken separately) and take 1/2 normal NPH dos the morning of the procedure.  Carry some sugar containing items with you to your appointment. A driver must accompany you and be prepared to drive you home after your procedure.  Bring all your current medications with your. An IV may be inserted and sedation may be given at the discretion of the physician.   A blood pressure cuff, EKG and other monitors will often be applied during the procedure.  Some patients may need to have extra oxygen administered for a short period. You will be asked to provide medical information, including your allergies, prior to the procedure.  We must know immediately if you are taking blood thinners (like Coumadin/Warfarin)  Or if you are allergic to IV iodine contrast (  dye). We must know if you could possible be pregnant.  Possible side-effects: Bleeding from needle site Infection (rare, may require surgery) Nerve injury (rare) Numbness & tingling (temporary) Difficulty urinating (rare, temporary) Spinal headache ( a headache worse with upright  posture) Light -headedness (temporary) Pain at injection site (several days) Decreased blood pressure (temporary) Weakness in arm/leg (temporary) Pressure sensation in back/neck (temporary)  Call if you experience: Fever/chills associated with headache or increased back/neck pain. Headache worsened by an upright position. New onset weakness or numbness of an extremity below the injection site Hives or difficulty breathing (go to the emergency room) Inflammation or drainage at the infection site Severe back/neck pain Any new symptoms which are concerning to you  Please note:  Although the local anesthetic injected can often make your back or neck feel good for several hours after the injection, the pain will likely return.  It takes 3-7 days for steroids to work in the epidural space.  You may not notice any pain relief for at least that one week.  If effective, we will often do a series of three injections spaced 3-6 weeks apart to maximally decrease your pain.  After the initial series, we generally will wait several months before considering a repeat injection of the same type.  If you have any questions, please call [phone] . [facility] 

## 2019-12-08 NOTE — Progress Notes (Signed)
PROVIDER NOTE: Information contained herein reflects review and annotations entered in association with encounter. Interpretation of such information and data should be left to medically-trained personnel. Information provided to patient can be located elsewhere in the medical record under "Patient Instructions". Document created using STT-dictation technology, any transcriptional errors that may result from process are unintentional.    Patient: Margaret Blackburn  Service Category: E/M  Provider: Gillis Santa, MD  DOB: 11-Feb-1965  DOS: 12/08/2019  Specialty: Interventional Pain Management  MRN: [id number]  Setting: Ambulatory outpatient  PCP: Kendell Bane, NP  Type: Established Patient    Referring Provider: Kendell Bane, NP  Location: Office  Delivery: Face-to-face     HPI  Ms. Margaret Blackburn, a 54 y.o. year old female, is here today because of her Lumbar radicular pain [M54.16]. Ms. Slaugh primary complain today is Back Pain (low) and Hip Pain (bilateral) Last encounter: My last encounter with her was on 12/03/2019. Pertinent problems: Ms. Colantuono does not have any pertinent problems on file. Pain Assessment: Severity of Chronic pain is reported as a 7 /10. Location: Back Lower/radiates down mainly right leg, numbness in right thigh. Onset: More than a month ago. Quality: Numbness, Tightness, Aching. Timing: Constant. Modifying factor(s): denies. Vitals:  height is '5\' 2"'  (1.575 m) and weight is 200 lb (90.7 kg). Her temperature is 97.2 F (36.2 C) (abnormal). Her blood pressure is 146/77 (abnormal) and her pulse is 110 (abnormal). Her respiration is 22 (abnormal) and oxygen saturation is 100%.   Reason for encounter: follow-up evaluation   Patient presents for her second patient visit.  Her initial visit with me was on 10/27/2019.    See H&P from initial clinic visit on 10/27/2019: Chabely is a pleasant 54 year old female who presents with a chief complaint of primarily low back pain that  radiates into bilateral hips, right anterior thigh that is worse with weightbearing and lumbar extension.  Patient endorses numbness and tingling in her right anterior thigh.  She also endorses pain in her left hand as well as bilateral foot pain.  Of note patient has a history of T5-L1 fusion for significant scoliosis.  This was done when she was a teenager.  She had a significant Cobb angle.  She denies any respiratory issues as a child.  Patient takes Mobic and ibuprofen together.  She takes a dose of ibuprofen as high as 1200 mg daily.  She has tried gabapentin in the past without any significant benefit.  She denies having tried Lyrica, TCA, Cymbalta.  She has also tried tizanidine at 2 mg in the past.  Patient has a history of obstructive sleep apnea and has tried CPAP in the past was not helpful.  She does have Harrington rods in place for her T4-L1 fusion.  She has tried Xanax in the past nightly to help with sleep but states that it was not effective.  She is currently on melatonin for insomnia.  Patient is a smoker, 1 pack/day.  She has had an EMG study with neurology that showed no lumbar radiculopathy or large fiber neuropathy.  Her lumbar MRI does show lumbar spondylosis and facet arthropathy.  She is being referred here by neurosurgery to consider injections and medication therapy.  She is currently not on opioid analgesics.  She has had prior urine toxicology screens positive for THC.  At that visit, her MRI was reviewed which shows facet joint disease at L4-L5 and L5-S1 as well as disc herniation at these respective levels that  are causing lateral recess and neuroforaminal stenosis.  Physical therapy was recommended at that time and she was started on amitriptyline.  She was also started on tizanidine for musculoskeletal spasms and cramps.  Opioid therapy is not an option given regular use of marijuana and as needed use of Xanax for anxiety.  She is supposed to start physical therapy tomorrow.   She does not find any analgesic benefit with amitriptyline at 50 mg nightly or tizanidine.  ROS  Constitutional: Denies any fever or chills Gastrointestinal: No reported hemesis, hematochezia, vomiting, or acute GI distress Musculoskeletal: Denies any acute onset joint swelling, redness, loss of ROM, or weakness Neurological: No reported episodes of acute onset apraxia, aphasia, dysarthria, agnosia, amnesia, paralysis, loss of coordination, or loss of consciousness  Medication Review  ALPRAZolam, Calcium Carb-Cholecalciferol, Melatonin, Multiple Vitamins-Minerals, acetaminophen, albuterol, anastrozole, aspirin, azithromycin, budesonide-formoterol, celecoxib, montelukast, and sertraline  History Review  Allergy: Ms. Netzley is allergic to penicillins. Drug: Ms. Thammavong  reports current drug use. Drug: Marijuana. Alcohol:  reports no history of alcohol use. Tobacco:  reports that she has been smoking cigarettes. She has a 30.00 pack-year smoking history. She has never used smokeless tobacco. Social: Ms. Vazquez  reports that she has been smoking cigarettes. She has a 30.00 pack-year smoking history. She has never used smokeless tobacco. She reports current drug use. Drug: Marijuana. She reports that she does not drink alcohol. Medical:  has a past medical history of Allergy, Arthritis, Breast cancer ([place]) (12/2017), COPD (chronic obstructive pulmonary disease) ([place]), Depression, Personal history of radiation therapy, Prediabetes, and Sleep apnea. Surgical: Ms. Sykora  has a past surgical history that includes Back surgery; Abdominal hysterectomy; Breast lumpectomy (Right, 03/03/2018); Breast lumpectomy with needle localization (Right, 03/03/2018); Sentinel node biopsy (Right, 03/21/2018); Breast biopsy (Right, 12/25/2017); and Colonoscopy with propofol (N/A, 08/31/2019). Family: family history includes Breast cancer in her maternal grandmother; Breast cancer (age of onset: 43) in her mother; Lung cancer in  her mother; Thyroid cancer in her father.  Laboratory Chemistry Profile   Renal Lab Results  Component Value Date   BUN 14 08/25/2019   CREATININE 0.65 08/25/2019   BCR 20 03/06/2019   GFRAA >60 08/25/2019   GFRNONAA >60 08/25/2019     Hepatic Lab Results  Component Value Date   AST 15 08/25/2019   ALT 20 08/25/2019   ALBUMIN 4.2 08/25/2019   ALKPHOS 59 08/25/2019     Electrolytes Lab Results  Component Value Date   NA 139 08/25/2019   K 4.1 08/25/2019   CL 105 08/25/2019   CALCIUM 8.7 (L) 08/25/2019     Bone Lab Results  Component Value Date   VD25OH 20.3 (L) 03/06/2019   [id number] 26 04/22/2018   [id number] 24 04/22/2018   [id number] <10 04/22/2018     Inflammation (CRP: Acute Phase) (ESR: Chronic Phase) No results found for: CRP, ESRSEDRATE, LATICACIDVEN     Note: Above Lab results reviewed.  Recent Imaging Review  MR THORACIC SPINE WO CONTRAST CLINICAL DATA:  Pain.  Scoliosis.  EXAM: MRI THORACIC SPINE WITHOUT CONTRAST  TECHNIQUE: Multiplanar, multisequence MR imaging of the thoracic spine was performed. No intravenous contrast was administered.  COMPARISON:  Radiographs 02/20/2019  FINDINGS: Alignment: No substantial subluxation. Dextrose curvature of the thoracic spine, better characterized on prior radiographs.  Vertebrae: Limited evaluation given artifact from posterior stabilization rods. Within this limitation, no evidence of abnormal bone marrow signal or vertebral body height loss.  Cord: The spinal cord is partially obscured in the upper  and lower thoracic spine. Visualized portions of the cord are unremarkable without abnormal cord signal.  Paraspinal and other soft tissues: Negative.  Disc levels:  Significantly limited evaluation given extensive metallic artifact from the patient's posterior spinal fusion hardware.  Within this limitation, no evidence of high-grade canal stenosis.  There are multiple levels of which the  foramina are obscured (including the left at T3-T4 and T4-T5 in the right at multiple levels in the lower thoracic spine). Visualized levels are without advanced foraminal stenosis. Small right eccentric disc bulge contacts the right eccentric cord at T2-T3 without significant canal stenosis.  IMPRESSION: Significantly limited evaluation given extensive metallic artifact from the patient's posterior stabilization rods. Within this limitation, no evidence of significant canal stenosis in the thoracic spine. Evaluation of the foramina is particularly limited with multiple levels obscured, but visualized levels demonstrate no significant stenosis. CT myelogram could provide further evaluation if clinically indicated.  Electronically Signed   By: Margaretha Sheffield MD   On: 10/26/2019 16:02 Note: Reviewed        Physical Exam  General appearance: Well nourished, well developed, and well hydrated. In no apparent acute distress Mental status: Alert, oriented x 3 (person, place, & time)       Respiratory: No evidence of acute respiratory distress Eyes: PERLA Vitals: BP (!) 146/77    Pulse (!) 110    Temp (!) 97.2 F (36.2 C)    Resp (!) 22    Ht '5\' 2"'  (1.575 m)    Wt 200 lb (90.7 kg)    SpO2 100%    BMI 36.58 kg/m  BMI: Estimated body mass index is 36.58 kg/m as calculated from the following:   Height as of this encounter: '5\' 2"'  (1.575 m).   Weight as of this encounter: 200 lb (90.7 kg). Ideal: Ideal body weight: 50.1 kg (110 lb 7.2 oz) Adjusted ideal body weight: 66.3 kg (146 lb 4.3 oz)  Cervical Spine Exam  Skin & Axial Inspection: No masses, redness, edema, swelling, or associated skin lesions Alignment: Symmetrical Functional ROM: Unrestricted ROM      Stability: No instability detected Muscle Tone/Strength: Functionally intact. No obvious neuro-muscular anomalies detected. Sensory (Neurological): Unimpaired Palpation: No palpable anomalies                    Upper  Extremity (UE) Exam    Side: Right upper extremity  Side: Left upper extremity   Skin & Extremity Inspection: Skin color, temperature, and hair growth are WNL. No peripheral edema or cyanosis. No masses, redness, swelling, asymmetry, or associated skin lesions. No contractures.  Skin & Extremity Inspection: Skin color, temperature, and hair growth are WNL. No peripheral edema or cyanosis. No masses, redness, swelling, asymmetry, or associated skin lesions. No contractures.   Functional ROM: Unrestricted ROM          Functional ROM: Unrestricted ROM           Muscle Tone/Strength: Functionally intact. No obvious neuro-muscular anomalies detected.   Muscle Tone/Strength: Functionally intact. No obvious neuro-muscular anomalies detected.   Sensory (Neurological): Musculoskeletal pain pattern hand          Sensory (Neurological): Musculoskeletal pain pattern hand           Palpation: No palpable anomalies              Palpation: No palpable anomalies               Provocative Test(s):  Phalen's test: deferred Tinel's  test: deferred Apley's scratch test (touch opposite shoulder):  Action 1 (Across chest): Decreased ROM Action 2 (Overhead): Decreased ROM Action 3 (LB reach): Decreased ROM   Provocative Test(s):  Phalen's test: deferred Tinel's test: deferred Apley's scratch test (touch opposite shoulder):  Action 1 (Across chest): Decreased ROM Action 2 (Overhead): Decreased ROM Action 3 (LB reach): Decreased ROM     Thoracic Spine Area Exam  Skin & Axial Inspection: Well healed scar from previous spine surgery detected T4-L1 fusion Alignment: Symmetrical Functional ROM: Pain restricted ROM Stability: No instability detected Muscle Tone/Strength: Functionally intact. No obvious neuro-muscular anomalies detected. Sensory (Neurological): Musculoskeletal pain pattern Muscle strength & Tone: No palpable anomalies  Lumbar Exam  Skin & Axial Inspection: Well healed scar from  previous spine surgery detected Alignment: Symmetrical Functional ROM: Pain restricted ROM affecting both sides Stability: No instability detected Muscle Tone/Strength: Functionally intact. No obvious neuro-muscular anomalies detected. Sensory (Neurological): Musculoskeletal pain pattern, dermatomal pain pattern Palpation: No palpable anomalies       Provocative Tests: Hyperextension/rotation test: (+) bilaterally for facet joint pain.,  Dermatomal pain pattern Lumbar quadrant test (Kemp's test): (+) bilaterally for facet joint pain. Lateral bending test: (+) due to fusion restriction. Patrick's Maneuver: (+) for bilateral S-I arthralgia             FABER* test: deferred today                   S-I anterior distraction/compression test: deferred today         S-I lateral compression test: deferred today         S-I Thigh-thrust test: deferred today         S-I Gaenslen's test: deferred today         *(Flexion, ABduction and External Rotation)  Gait & Posture Assessment  Ambulation: Unassisted Gait: Relatively normal for age and body habitus Posture: WNL   Lower Extremity Exam    Side: Right lower extremity  Side: Left lower extremity  Stability: No instability observed          Stability: No instability observed          Skin & Extremity Inspection: Skin color, temperature, and hair growth are WNL. No peripheral edema or cyanosis. No masses, redness, swelling, asymmetry, or associated skin lesions. No contractures.  Skin & Extremity Inspection: Skin color, temperature, and hair growth are WNL. No peripheral edema or cyanosis. No masses, redness, swelling, asymmetry, or associated skin lesions. No contractures.  Functional ROM: Pain restricted ROM for hip and knee joints          Functional ROM: Pain restricted ROM for hip and knee joints          Muscle Tone/Strength: Functionally intact. No obvious neuro-muscular anomalies detected.  Muscle Tone/Strength: Functionally intact.  No obvious neuro-muscular anomalies detected.  Sensory (Neurological): Musculoskeletal pain pattern        Sensory (Neurological): Musculoskeletal pain pattern        DTR: Patellar: deferred today Achilles: deferred today Plantar: deferred today  DTR: Patellar: deferred today Achilles: deferred today Plantar: deferred today  Palpation: No palpable anomalies  Palpation: No palpable anomalies     Assessment   Status Diagnosis  Controlled Controlled Controlled 1. Lumbar radicular pain   2. Neuroforaminal stenosis of lumbar spine   3. Lumbar facet arthropathy   4. Lumbar spondylosis   5. Trochanteric bursitis of both hips   6. 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)   7. Chronic  pain syndrome      Updated Problems: Problem  Lumbar Radicular Pain  Lumbar Facet Arthropathy    Plan of Care   Ms. Margaret Blackburn has a current medication list which includes the following long-term medication(s): albuterol, budesonide-formoterol, montelukast, and sertraline.   1.  Continue with physical therapy 2.  Avoid opioid analgesics given THC use and daily use of Xanax. 3.  Celebrex as below. 4.  Based upon lumbar MRI findings and clinical exam, recommend diagnostic lumbar epidural steroid injection at L4-L5 for chronic lumbar radicular pain.  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  Pharmacotherapy (Medications Ordered): Meds ordered this encounter  Medications   celecoxib (CELEBREX) 100 MG capsule    Sig: Take 1 capsule (100 mg total) by mouth 2 (two) times daily.    Dispense:  60 capsule    Refill:  2   Orders:  Orders Placed This Encounter  Procedures   Lumbar Epidural Injection    Standing Status:   Future    Standing Expiration Date:   01/07/2020    Scheduling Instructions:     Procedure: Interlaminar Lumbar Epidural Steroid injection (LESI)            Laterality: Midline     Sedation: without      Timeframe: ASAA    Order Specific Question:   Where will this procedure be performed?    Answer:   [facility]   Pharmacological management options:  Opioid Analgesics: Avoid, patient utilizes THC  Membrane stabilizer: Tried and failed gabapentin.  Trial of amitriptyline.  Future considerations include Lyrica, Cymbalta  Muscle relaxant: Trial of tizanidine.  Consider Robaxin, Flexeril in future  NSAID: Encourage patient to only utilize mono NSAID therapy, ibuprofen or Mobic.  Patient prefers to incorporate ibuprofen as a part of her multimodal med regimen.  Other analgesic(s): Consider TENS unit, topicals   Interventional management options: Ms. Speranza was informed that there is no guarantee that she would be a candidate for interventional therapies. The decision will be based on the results of diagnostic studies, as well as Ms. Folino's risk profile.  Procedure(s) under consideration:  Lumbar facet medial branch nerve block, lumbar radiofrequency ablation, Sprint peripheral nerve stimulation of lumbar medial branch     Follow-up plan:   Return in about 2 weeks (around 12/22/2019) for L-ESI (L4/5), without sedation.   Recent Visits Date Type Provider Dept  10/27/19 Office Visit Gillis Santa, MD [facility]  Showing recent visits within past 90 days and meeting all other requirements Today's Visits Date Type Provider Dept  12/08/19 Office Visit Gillis Santa, MD [facility]  Showing today's visits and meeting all other requirements Future Appointments No visits were found meeting these conditions. Showing future appointments within next 90 days and meeting all other requirements  I discussed the assessment and treatment plan with the patient. The patient was provided an opportunity to ask questions and all were answered. The patient agreed with the plan and demonstrated an understanding of the instructions.  Patient advised to call back or seek an  in-person evaluation if the symptoms or condition worsens.  Duration of encounter: 30 minutes.  Note by: Gillis Santa, MD Date: 12/08/2019; Time: 10:10 AM

## 2019-12-09 ENCOUNTER — Encounter: Payer: Medicaid Other | Admitting: Physical Therapy

## 2019-12-14 ENCOUNTER — Encounter: Payer: Medicaid Other | Admitting: Physical Therapy

## 2019-12-16 ENCOUNTER — Encounter: Payer: Medicaid Other | Admitting: Physical Therapy

## 2019-12-21 ENCOUNTER — Encounter: Payer: Medicaid Other | Admitting: Physical Therapy

## 2019-12-22 ENCOUNTER — Other Ambulatory Visit: Payer: Self-pay

## 2019-12-22 ENCOUNTER — Ambulatory Visit (INDEPENDENT_AMBULATORY_CARE_PROVIDER_SITE_OTHER): Payer: Medicaid Other | Admitting: Internal Medicine

## 2019-12-22 ENCOUNTER — Encounter: Payer: Self-pay | Admitting: Internal Medicine

## 2019-12-22 DIAGNOSIS — C50911 Malignant neoplasm of unspecified site of right female breast: Secondary | ICD-10-CM

## 2019-12-22 DIAGNOSIS — Z79899 Other long term (current) drug therapy: Secondary | ICD-10-CM | POA: Diagnosis not present

## 2019-12-22 DIAGNOSIS — J209 Acute bronchitis, unspecified: Secondary | ICD-10-CM

## 2019-12-22 DIAGNOSIS — J440 Chronic obstructive pulmonary disease with acute lower respiratory infection: Secondary | ICD-10-CM

## 2019-12-22 DIAGNOSIS — F411 Generalized anxiety disorder: Secondary | ICD-10-CM

## 2019-12-22 DIAGNOSIS — F321 Major depressive disorder, single episode, moderate: Secondary | ICD-10-CM | POA: Diagnosis not present

## 2019-12-22 LAB — POCT URINE DRUG SCREEN
POC Amphetamine UR: NOT DETECTED
POC BENZODIAZEPINES UR: NOT DETECTED
POC Barbiturate UR: NOT DETECTED
POC Cocaine UR: NOT DETECTED
POC Ecstasy UR: NOT DETECTED
POC Marijuana UR: POSITIVE — AB
POC Methadone UR: NOT DETECTED
POC Methamphetamine UR: NOT DETECTED
POC Opiate Ur: NOT DETECTED
POC Oxycodone UR: NOT DETECTED
POC PHENCYCLIDINE UR: NOT DETECTED
POC TRICYCLICS UR: POSITIVE — AB

## 2019-12-22 MED ORDER — SPIRIVA HANDIHALER 18 MCG IN CAPS: 18.0000 ug | ORAL_CAPSULE | Freq: Every day | RESPIRATORY_TRACT | 3 refills | Status: DC

## 2019-12-22 MED ORDER — SERTRALINE HCL 100 MG PO TABS: 100.0000 mg | ORAL_TABLET | Freq: Every day | ORAL | 1 refills | Status: DC

## 2019-12-22 MED ORDER — LEVOFLOXACIN 500 MG PO TABS: 500.0000 mg | ORAL_TABLET | Freq: Every day | ORAL | 0 refills | Status: DC

## 2019-12-22 MED ORDER — ALPRAZOLAM 0.25 MG PO TABS: 0.2500 mg | ORAL_TABLET | Freq: Two times a day (BID) | ORAL | 0 refills | Status: DC | PRN

## 2019-12-22 NOTE — Progress Notes (Signed)
[facility] [place], [place] [address]  Internal MEDICINE  Office Visit Note  Patient Name: Margaret Blackburn  [id number]  [id number]  Date of Service: 12/23/2019  Chief Complaint  Patient presents with  . Acute Visit    refill request, right ear pain  . Back Pain    upper back pain, x3 days   . Cough  . policy update form    received  . COPD  . Depression  . Sleep Apnea    HPI  Pt is here for sick and acute visit. 1. Cough with c/p and sob, cough is productive. Continues to be a smoker, last cxr in 5/21 showed chronic bronchitis. No CT chest on file.  2. Chronic back pain due to spinal fusion surgery. See pain management for epidural injections  3. Breast cancer , stage I ER right breast on Arimidex. 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 7 mm, grade 1 ER greater than 90% positive, PR 51 to 90% positive and HER-2/neu negative.5 sentinel LN negative for malignancy 4. Depression is better with Zoloft, takes Xanax prn   Current Medication: Outpatient Encounter Medications as of 12/22/2019  Medication Sig Note  . acetaminophen (TYLENOL) 500 MG tablet Take 500 mg by mouth every 6 (six) hours as needed.   Marland Kitchen albuterol (VENTOLIN HFA) 108 (90 Base) MCG/ACT inhaler Inhale 2 puffs into the lungs every 6 (six) hours as needed for wheezing or shortness of breath.   . ALPRAZolam (XANAX) 0.25 MG tablet Take 1 tablet (0.25 mg total) by mouth 2 (two) times daily as needed for anxiety.   Marland Kitchen anastrozole (ARIMIDEX) 1 MG tablet TAKE 1 TABLET BY MOUTH ONCE DAILY   . aspirin 81 MG chewable tablet Chew 81 mg by mouth daily.   Marland Kitchen azithromycin (ZITHROMAX Z-PAK) 250 MG tablet Take two 250 mg tablets on day one followed by one 250 mg tablet each day for four days.   . budesonide-formoterol (SYMBICORT) 160-4.5 MCG/ACT inhaler Inhale 2 puffs into the lungs 2 (two) times daily.    . Calcium Carb-Cholecalciferol (CALCIUM 600+D3 PO) Take 1 tablet by mouth in the morning and at bedtime. 08/25/2019: Pt does not know dose of vit d 3  . celecoxib (CELEBREX) 100 MG capsule Take 1 capsule (100 mg total) by mouth 2 (two) times daily.   Marland Kitchen levofloxacin (LEVAQUIN) 500 MG tablet Take 1 tablet (500 mg total) by mouth daily.   . Melatonin 1 MG CAPS Take 1 mg by mouth at bedtime as needed.   . montelukast (SINGULAIR) 10 MG tablet Take 1 tablet (10 mg total) by mouth at bedtime.   . Multiple Vitamins-Minerals (ALIVE WOMENS ENERGY PO) Take 1 Dose by mouth daily.   . sertraline (ZOLOFT) 100 MG tablet Take 1 tablet (100 mg total) by mouth daily.   Marland Kitchen tiotropium (SPIRIVA HANDIHALER) 18 MCG inhalation capsule Place 1 capsule (18 mcg total) into inhaler and inhale daily.   . [DISCONTINUED] ALPRAZolam (XANAX) 0.25 MG tablet Take 1 tablet (0.25 mg total) by mouth 2 (two) times daily as needed for anxiety.   . [DISCONTINUED] sertraline (ZOLOFT) 100 MG tablet Take 0.5 tablets (50 mg total) by mouth daily. (Patient taking differently: Take 100 mg by mouth daily. )    No facility-administered encounter medications on file as of 12/22/2019.    Surgical History: Past Surgical History:  Procedure Laterality Date  . ABDOMINAL HYSTERECTOMY    .  BACK SURGERY    . BREAST BIOPSY Right 12/25/2017   affirm bx , x clip,TUBULAR  CARCINOMA  . 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 BREAST LUMPECTOMY WITH NEEDLE LOCALIZATION Right 03/03/2018   Procedure: BREAST LUMPECTOMY WITH NEEDLE LOCALIZATION;  Surgeon: Vickie Epley, MD;  Location: [facility] ORS;  Service: General;  Laterality: Right;  . COLONOSCOPY WITH PROPOFOL N/A 08/31/2019   Procedure: COLONOSCOPY WITH PROPOFOL;  Surgeon: Jonathon Bellows, MD;  Location: [facility] ENDOSCOPY;  Service: Gastroenterology;  Laterality: N/A;  . SENTINEL NODE BIOPSY Right 03/21/2018   Procedure: RIGHT SENTINEL LYMPH  NODE BIOPSY;  Surgeon: Vickie Epley, MD;   Location: [facility] ORS;  Service: General;  Laterality: Right;    Medical History: Past Medical History:  Diagnosis Date  . Allergy   . Arthritis   . Breast cancer ([place]) 12/2017   right breast  . COPD (chronic obstructive pulmonary disease) ([place])   . Depression   . Personal history of radiation therapy   . Prediabetes   . Sleep apnea    supposed to get CPAP 03/25/19    Family History: Family History  Problem Relation Age of Onset  . Breast cancer Mother 62  . Lung cancer Mother   . Breast cancer Maternal Grandmother   . Thyroid cancer Father     Social History   Socioeconomic History  . Marital status: Single    Spouse name: Not on file  . Number of children: 2  . Years of education: Not on file  . Highest education level: Not on file  Occupational History  . Not on file  Tobacco Use  . Smoking status: Current Every Day Smoker    Packs/day: 1.00    Years: 30.00    Pack years: 30.00    Types: Cigarettes  . Smokeless tobacco: Never Used  . Tobacco comment: in the process of trying to quit-chantix  Vaping Use  . Vaping Use: Former  . Devices: used  2 weeks per pt  Substance and Sexual Activity  . Alcohol use: No  . Drug use: Yes    Types: Marijuana    Comment: occ  . Sexual activity: Yes  Other Topics Concern  . Not on file  Social History Narrative  . Not on file   Social Determinants of Health   Financial Resource Strain:   . Difficulty of Paying Living Expenses: Not on file  Food Insecurity:   . Worried About Charity fundraiser in the Last Year: Not on file  . Ran Out of Food in the Last Year: Not on file  Transportation Needs:   . Lack of Transportation (Medical): Not on file  . Lack of Transportation (Non-Medical): Not on file  Physical Activity:   . Days of Exercise per Week: Not on file  . Minutes of Exercise per Session: Not on file  Stress:   . Feeling of Stress : Not on file  Social Connections:   . Frequency of Communication with Friends  and Family: Not on file  . Frequency of Social Gatherings with Friends and Family: Not on file  . Attends Religious Services: Not on file  . Active Member of Clubs or Organizations: Not on file  . Attends Archivist Meetings: Not on file  . Marital Status: Not on file  Intimate Partner Violence:   . Fear of Current or Ex-Partner: Not on file  . Emotionally Abused: Not on file  . Physically Abused: Not on file  .  Sexually Abused: Not on file      Review of Systems  Constitutional: Negative for chills, diaphoresis and fatigue.  HENT: Negative for ear pain, postnasal drip and sinus pressure.   Eyes: Negative for photophobia, discharge, redness, itching and visual disturbance.  Respiratory: Positive for cough. Negative for shortness of breath and wheezing.   Cardiovascular: Negative for chest pain, palpitations and leg swelling.  Gastrointestinal: Negative for abdominal pain, constipation, diarrhea, nausea and vomiting.  Genitourinary: Negative for dysuria and flank pain.  Musculoskeletal: Positive for back pain. Negative for arthralgias, gait problem and neck pain.  Skin: Negative for color change.  Allergic/Immunologic: Negative for environmental allergies and food allergies.  Neurological: Negative for dizziness and headaches.  Hematological: Does not bruise/bleed easily.  Psychiatric/Behavioral: Negative for agitation, behavioral problems (depression) and hallucinations.    Vital Signs: BP 126/78   Pulse (!) 105   Temp 97.9 F (36.6 C)   Resp 16   Ht _0  (1.575 m)   Wt 203 lb 9.6 oz (92.4 kg)   SpO2 98%   BMI 37.24 kg/m    Physical Exam Constitutional:      General: She is not in acute distress.    Appearance: She is well-developed. She is not diaphoretic.  HENT:     Head: Normocephalic and atraumatic.     Mouth/Throat:     Pharynx: No oropharyngeal exudate.  Eyes:     Pupils: Pupils are equal, round, and reactive to light.  Neck:     Thyroid: No  thyromegaly.     Vascular: No JVD.     Trachea: No tracheal deviation.  Cardiovascular:     Rate and Rhythm: Normal rate and regular rhythm.     Heart sounds: Normal heart sounds. No murmur heard.  No friction rub. No gallop.   Pulmonary:     Effort: Pulmonary effort is normal. No respiratory distress.     Breath sounds: Decreased air movement present. Examination of the right-upper field reveals rales. Examination of the left-upper field reveals rales. Rales present. No wheezing.  Chest:     Chest wall: No tenderness.  Abdominal:     General: Bowel sounds are normal.     Palpations: Abdomen is soft.  Musculoskeletal:        General: Normal range of motion.     Cervical back: Normal range of motion and neck supple.  Lymphadenopathy:     Cervical: No cervical adenopathy.  Skin:    General: Skin is warm and dry.  Neurological:     Mental Status: She is alert and oriented to person, place, and time.     Cranial Nerves: No cranial nerve deficit.  Psychiatric:        Behavior: Behavior normal.        Thought Content: Thought content normal.        Judgment: Judgment normal.     Assessment/Plan: 1. Acute bronchitis with COPD ([place]) Will start Levaquin and add Spiriva, if no improvement seen will need CT chest  - levofloxacin (LEVAQUIN) 500 MG tablet; Take 1 tablet (500 mg total) by mouth daily.  Dispense: 10 tablet; Refill: 0 - tiotropium (SPIRIVA HANDIHALER) 18 MCG inhalation capsule; Place 1 capsule (18 mcg total) into inhaler and inhale daily.  Dispense: 30 capsule; Refill: 3 - Pulmonary Function Test; Future  2. Depression, major, single episode, moderate (HCC) Continue Zoloft  - sertraline (ZOLOFT) 100 MG tablet; Take 1 tablet (100 mg total) by mouth daily.  Dispense: 90 tablet; Refill:  1  3. GAD (generalized anxiety disorder) Continue low dose Xanax prn only.  - ALPRAZolam (XANAX) 0.25 MG tablet; Take 1 tablet (0.25 mg total) by mouth 2 (two) times daily as needed for  anxiety.  Dispense: 30 tablet; Refill: 0  4. Encounter for long-term (current) use of high-risk medication - POCT Urine Drug Screen  5. Malignant neoplasm of right breast greater than or equal to 2 cm in greatest dimension [facility]) Followed by Oncology, on Arimidex   General Counseling: Lakie verbalizes understanding of the findings of todays visit and agrees with plan of treatment. I have discussed any further diagnostic evaluation that may be needed or ordered today. We also reviewed her medications today. she has been encouraged to call the office with any questions or concerns that should arise related to todays visit.    Orders Placed This Encounter  Procedures  . POCT Urine Drug Screen  . Pulmonary Function Test    Meds ordered this encounter  Medications  . sertraline (ZOLOFT) 100 MG tablet    Sig: Take 1 tablet (100 mg total) by mouth daily.    Dispense:  90 tablet    Refill:  1  . levofloxacin (LEVAQUIN) 500 MG tablet    Sig: Take 1 tablet (500 mg total) by mouth daily.    Dispense:  10 tablet    Refill:  0  . tiotropium (SPIRIVA HANDIHALER) 18 MCG inhalation capsule    Sig: Place 1 capsule (18 mcg total) into inhaler and inhale daily.    Dispense:  30 capsule    Refill:  3  . ALPRAZolam (XANAX) 0.25 MG tablet    Sig: Take 1 tablet (0.25 mg total) by mouth 2 (two) times daily as needed for anxiety.    Dispense:  30 tablet    Refill:  0    Total time spent:35 Minutes Time spent includes review of chart, medications, test results, and follow up plan with the patient.      Dr Lavera Guise Internal medicine

## 2019-12-23 ENCOUNTER — Encounter: Payer: Medicaid Other | Admitting: Physical Therapy

## 2019-12-23 ENCOUNTER — Ambulatory Visit
Payer: Medicaid Other | Attending: Student in an Organized Health Care Education/Training Program | Admitting: Physical Therapy

## 2019-12-23 ENCOUNTER — Ambulatory Visit: Payer: Medicaid Other

## 2019-12-23 ENCOUNTER — Encounter: Payer: Self-pay | Admitting: Physical Therapy

## 2019-12-23 DIAGNOSIS — G8929 Other chronic pain: Secondary | ICD-10-CM | POA: Insufficient documentation

## 2019-12-23 DIAGNOSIS — G4733 Obstructive sleep apnea (adult) (pediatric): Secondary | ICD-10-CM | POA: Diagnosis not present

## 2019-12-23 DIAGNOSIS — R2689 Other abnormalities of gait and mobility: Secondary | ICD-10-CM | POA: Diagnosis present

## 2019-12-23 DIAGNOSIS — M545 Low back pain, unspecified: Secondary | ICD-10-CM | POA: Insufficient documentation

## 2019-12-23 DIAGNOSIS — M6281 Muscle weakness (generalized): Secondary | ICD-10-CM | POA: Insufficient documentation

## 2019-12-23 NOTE — Therapy (Signed)
[facility] [address], [place], [address] Phone: [phone]   Fax:  [phone]  Physical Therapy Evaluation  Patient Details  Name: Margaret Blackburn MRN: [id number] Date of Birth: Jul 02, 1965 No data recorded  Encounter Date: 12/23/2019   PT End of Session - 12/23/19 1010    Visit Number 1    Number of Visits 17    Date for PT Re-Evaluation 01/20/20    PT Start Time 0810    PT Stop Time 0857    PT Time Calculation (min) 47 min    Activity Tolerance Patient tolerated treatment well    Behavior During Therapy [facility] for tasks assessed/performed           Past Medical History:  Diagnosis Date  . Allergy   . Arthritis   . Breast cancer ([place]) 12/2017   right breast  . COPD (chronic obstructive pulmonary disease) ([place])   . Depression   . Personal history of radiation therapy   . Prediabetes   . Sleep apnea    supposed to get CPAP 03/25/19    Past Surgical History:  Procedure Laterality Date  . ABDOMINAL HYSTERECTOMY    . BACK SURGERY    . BREAST BIOPSY Right 12/25/2017   affirm bx , x clip,TUBULAR  CARCINOMA  . 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 BREAST LUMPECTOMY WITH NEEDLE LOCALIZATION Right 03/03/2018   Procedure: BREAST LUMPECTOMY WITH NEEDLE LOCALIZATION;  Surgeon: Vickie Epley, MD;  Location: [facility] ORS;  Service: General;  Laterality: Right;  . COLONOSCOPY WITH PROPOFOL N/A 08/31/2019   Procedure: COLONOSCOPY WITH PROPOFOL;  Surgeon: Jonathon Bellows, MD;  Location[facility] ENDOSCOPY;  Service: Gastroenterology;  Laterality: N/A;  . SENTINEL NODE BIOPSY Right 03/21/2018   Procedure: RIGHT SENTINEL LYMPH  NODE BIOPSY;  Surgeon: Vickie Epley, MD;  Location: [facility] ORS;  Service: General;  Laterality: Right;    There were no vitals filed for this visit.   Subjective Assessment - 12/23/19 1003    Pertinent History Pt is a 54 year old female presenting with LBP radiating  into her BLE, with occassionally numbess in RLE that does not go past knee. Pt has had back surgery to correct scoliosis when she was 54y/o and has been dealing with LBP for many years. Pt reports she is sleeping on recliner because lying on her back or side is difficult and painful. Pt is unable to stand or walk for progloned periods and rates her worst pain a 10/10 and best a 3/10 on the NPRS. Pt denies any N/V, B&B loss, saddle paresthesia, fever, unexplained weight loss.    Limitations Walking;House hold activities;Standing    How long can you sit comfortably? unlimited    How long can you stand comfortably? <62mins    How long can you walk comfortably? 78mins    Patient Stated Goals decrease pain    Currently in Pain? Yes    Pain Score 5     Pain Location Back    Pain Orientation Lower    Pain Descriptors / Indicators Aching;Tender;Sore;Tightness;Constant;Radiating;Numbness    Pain Type Chronic pain    Pain Radiating Towards radiated down BLE, numbness in RLE    Pain Onset More than a month ago    Pain Frequency Constant    Aggravating Factors  walking, standing for too long, squatting    Pain Relieving Factors rest    Effect of Pain on Daily Activities limits ADLs,  housework         OBJECTIVE    SENSATION: Grossly intact BLE     Posture Lumbar lordosis: decreased curvature  Iliac crest height: equal bilat    Palpation TTP on R QL, CPA painful from L1-L5 and sacrum grade 1-2   AROM (degrees) R/L  Lumbar- limited d/t pain  Flexion- bilat midsfhat tibia, with min pain  Extension- more painful than flexion  SBing- equally bilat, min pain in hip  Rotation- pain turning R and L    Knee full ROM, pain with WBing activities   Hip PROM- full flexion, limited ER/IR d/t pain and tightness     Repeated Movements Extension x10 reps pain  Flexion x10 pain  Subjectively flexion is better, objective both are difficult d/t high pain levels    Strength (out of 5) R/L  Hip  flexion 3+/3+ painful  Hip ER 4/4  Hip IR 3+/3+ pain   Hip abduction 5 pain/ 4+ with pain  Hip adduction 3 with pain/ 3 with pain in hip  Hip extension 4/4 with pain   Knee extension 4+/4+ Knee flexion 5/5   Ankle dorsiflexion 5/5  Ankle plantarflexion  5/5     Muscle Length Hamstrings: positive [place]: positive     Passive Accessory Intervertebral Motion (PAIVM) Pain with CPA from L1-L5 and sacrum     SPECIAL TESTS Lumbar Radiculopathy: Centralization and Peripheralization: negative  Slump: negative  SLR: negative Crossed SLR: negative  SLR only causes pain in low back   Hip: FABER: postive bilat     Functional Tasks Squat:pain in hips and low back, increased forefoot WBing and trunk flexion  SLS: 2sec bilat with pain  10MWT: self selected 0.65m/s, fastest 0.71m/s 5TSTS: 27.92     Ther-Ex PT reviewed the following HEP with patient with patient able to demonstrate a set of the following with min cuing for correction needed. PT educated patient on parameters of therex (how/when to inc/decrease intensity, frequency, rep/set range, stretch hold time, and purpose of therex) with verbalized understanding.  Supine Bridge 1xday 6-10reps 2-3sets Hooklying SKTC 2xday 30sec holds  Lower Trunk Rotation 20reps 2-3sec holds   Objective measurements completed on examination: See above findings.                 PT Education - 12/23/19 1009    Education Details anatomy, diagnosis, prognosis, therex form/technique, benefits of PT, HEP    Person(s) Educated Patient    Methods Explanation;Demonstration;Verbal cues    Comprehension Verbalized understanding;Returned demonstration;Verbal cues required            PT Short Term Goals - 12/23/19 1142      PT SHORT TERM GOAL #1   Title Pt will increase gait speed to 0.8m/s to demonstrate safe house ambulation speed    Baseline 12/23/19 0.58m/s, fastest 0.51m/s    Time 4    Period Weeks    Status New    Target Date  01/20/20      PT SHORT TERM GOAL #2   Title Pt will independent with HEP to decrease LBP pain and increase function to be able to complete household chores.    Baseline 12/23/19 HEP given    Time 4    Period Weeks    Status New    Target Date 01/20/20      PT SHORT TERM GOAL #3   Title Pt will report 2 point difference on NPRS at worst to demonstrate clinically signficant reduction in low back pain.  Baseline 12/23/19 10/10 at worst    Time 4    Period Weeks    Status New    Target Date 01/20/20             PT Long Term Goals - 12/23/19 1038      PT LONG TERM GOAL #1   Title Pt will increase gait speed to 1.63m/s to demonstrate safe community ambulation speed    Baseline 12/23/19 self selected 0.30m/s, fastest 0.17m/s    Time 8    Period Weeks    Status New    Target Date 02/17/20      PT LONG TERM GOAL #2   Title Pt will decrease 5TSTS by 10secs to demonstrate clinically proven improvement in BLE strength and be able to complete transfers with decreased difficulty.    Baseline 12/23/19 27.92sec    Time 8    Period Weeks    Status New    Target Date 02/17/20      PT LONG TERM GOAL #3   Title Pt will increase bilateral hip MMT strength to at least 4/4 to decrease pain and increase standing tolerance >5 mins to be able to wash dishes.    Baseline 12/23/19 flexion 3+/3+, IR 3+/3+, add 3/3    Time 8    Period Weeks    Status New    Target Date 02/17/20                  Plan - 12/23/19 1030    Clinical Impression Statement Pt is a 54 year old female presenting with chronic low back pain radiating into BLE without sciatica. Pt has impairments in lumbar ROM, muscle weakness, postural deficits, decreased functional mobility, decreased neuromuscular control. Pt has limitations with bending, sleeping, standing, and walking that limit her ability to complete ADLs and household chores without pain and difficulty. Pt will benefit from skilled PT to address  aforementioned deficits to be able to complete ADLs and housework without pain.    Personal Factors and Comorbidities Age;Comorbidity 1;Comorbidity 2;Time since onset of injury/illness/exacerbation;Past/Current Experience;Fitness;Sex    Comorbidities COPD, arthiritis, depression/anxiety    Examination-Activity Limitations Bed Mobility;Bend;Locomotion Level;Lift;Sleep;Squat;Transfers;Stand;Stairs    Examination-Participation Restrictions Cleaning;Laundry;Shop;Community Activity    Stability/Clinical Decision Making Evolving/Moderate complexity    Clinical Decision Making Moderate    Rehab Potential Good    PT Frequency 2x / week    PT Duration 8 weeks    PT Treatment/Interventions ADLs/Self Care Home Management;Electrical Stimulation;Moist Heat;Gait training;Stair training;Functional mobility training;Therapeutic activities;Therapeutic exercise;Balance training;Neuromuscular re-education;Manual techniques;Passive range of motion;Dry needling    PT Next Visit Plan review HEP, lumbar and BLE strengthening    PT Home Exercise Plan lumbar rotations, SKTC, bridges    Consulted and Agree with Plan of Care Patient           Patient will benefit from skilled therapeutic intervention in order to improve the following deficits and impairments:  Abnormal gait, Decreased activity tolerance, Decreased endurance, Decreased strength, Decreased range of motion, Hypomobility, Increased fascial restricitons, Impaired perceived functional ability, Improper body mechanics, Pain, Decreased balance, Decreased coordination, Decreased mobility, Difficulty walking  Visit Diagnosis: Chronic low back pain without sciatica, unspecified back pain laterality  Muscle weakness (generalized)  Other abnormalities of gait and mobility     Problem List Patient Active Problem List   Diagnosis Date Noted  . Lumbar radicular pain 12/08/2019  . 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) 10/27/2019  . Lumbar facet  arthropathy 10/27/2019  . Trochanteric bursitis  of both hips 10/27/2019  . Chronic pain syndrome 10/27/2019  . Respiratory tract infection due to COVID-19 virus 03/27/2019  . Cough 03/27/2019  . Invasive carcinoma of breast ([place]) 03/24/2018  . Goals of care, counseling/discussion 03/24/2018  . Malignant neoplasm of right breast greater than or equal to 2 cm in greatest dimension ([place])   . Mass of right breast 02/24/2018    Durwin Reges DPT [facility], SPT Durwin Reges 12/23/2019, 12:46 PM  [place] [facility] [address], [place], [address] Phone: [phone]   Fax:  [phone]  Name: KENASIA SCHELLER MRN: [id number] Date of Birth: September 23, 1965

## 2019-12-28 ENCOUNTER — Encounter: Payer: Medicaid Other | Admitting: Physical Therapy

## 2019-12-30 ENCOUNTER — Ambulatory Visit: Payer: Medicaid Other | Admitting: Physical Therapy

## 2019-12-30 ENCOUNTER — Encounter: Payer: Medicaid Other | Admitting: Physical Therapy

## 2019-12-30 NOTE — Progress Notes (Signed)
95 percentile pressure 12   95th percentile leak 6   apnea index 0.7 /hr  apnea-hypopnea index  1.9 /hr   total days used  >4 hr 53 days  total days used <4 hr 17 days  Total compliance 39 percent

## 2020-01-04 ENCOUNTER — Encounter: Payer: Medicaid Other | Admitting: Physical Therapy

## 2020-01-04 ENCOUNTER — Ambulatory Visit: Payer: Medicaid Other | Admitting: Physical Therapy

## 2020-01-04 ENCOUNTER — Ambulatory Visit
Payer: Medicaid Other | Attending: Student in an Organized Health Care Education/Training Program | Admitting: Student in an Organized Health Care Education/Training Program

## 2020-01-06 ENCOUNTER — Ambulatory Visit
Payer: Medicaid Other | Attending: Student in an Organized Health Care Education/Training Program | Admitting: Physical Therapy

## 2020-01-11 ENCOUNTER — Ambulatory Visit: Payer: Medicaid Other | Admitting: Physical Therapy

## 2020-01-13 ENCOUNTER — Ambulatory Visit: Payer: Medicaid Other | Admitting: Internal Medicine

## 2020-01-13 ENCOUNTER — Other Ambulatory Visit: Payer: Self-pay

## 2020-01-13 ENCOUNTER — Ambulatory Visit: Payer: Medicaid Other | Admitting: Physical Therapy

## 2020-01-13 DIAGNOSIS — J449 Chronic obstructive pulmonary disease, unspecified: Secondary | ICD-10-CM

## 2020-01-13 DIAGNOSIS — R0602 Shortness of breath: Secondary | ICD-10-CM | POA: Diagnosis not present

## 2020-01-13 LAB — PULMONARY FUNCTION TEST

## 2020-01-18 ENCOUNTER — Encounter: Payer: Medicaid Other | Admitting: Physical Therapy

## 2020-01-20 ENCOUNTER — Encounter: Payer: Medicaid Other | Admitting: Physical Therapy

## 2020-01-21 NOTE — Procedures (Signed)
[facility] [place], [address]  DATE OF SERVICE: January 13, 2020  Complete Pulmonary Function Testing Interpretation:  FINDINGS:  Forced vital capacity is normal.  FEV1 is 1.87 L which is 74% predicted and is mildly decreased.  Postbronchodilator no significant change in FEV1.  Total lung capacity is normal residual volume is increased residual until infatuation is increased FRC is normal.  DLCO is mildly decreased  IMPRESSION:  This pulmonary function study is consistent with mild obstructive lung disease.  There did not seem to be improvement after bronchodilation  Allyne Gee, MD [facility] Pulmonary Critical Care Medicine Sleep Medicine

## 2020-01-25 ENCOUNTER — Encounter: Payer: Medicaid Other | Admitting: Physical Therapy

## 2020-01-27 ENCOUNTER — Encounter: Payer: Medicaid Other | Admitting: Physical Therapy

## 2020-01-30 IMAGING — MG DIGITAL DIAGNOSTIC UNILATERAL RIGHT MAMMOGRAM WITH TOMO AND CAD
6 series · 6 of 18 positions shown · non-contrast
Comparison: 11/27/2017.

CLINICAL DATA: 52-year-old patient recalled from recent screening
mammogram for evaluation of possible architectural distortion in the
right breast.

EXAM:
DIGITAL DIAGNOSTIC RIGHT MAMMOGRAM WITH TOMO
ULTRASOUND RIGHT BREAST

[R CC synth-2D]
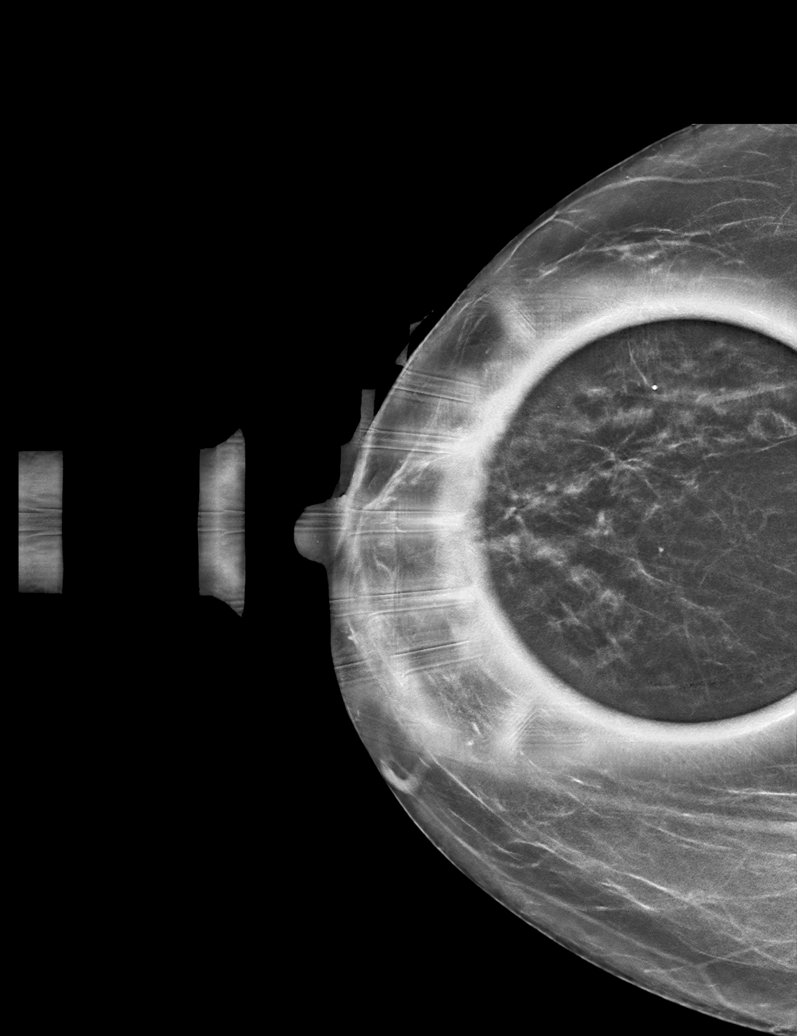

[R MLO synth-2D]
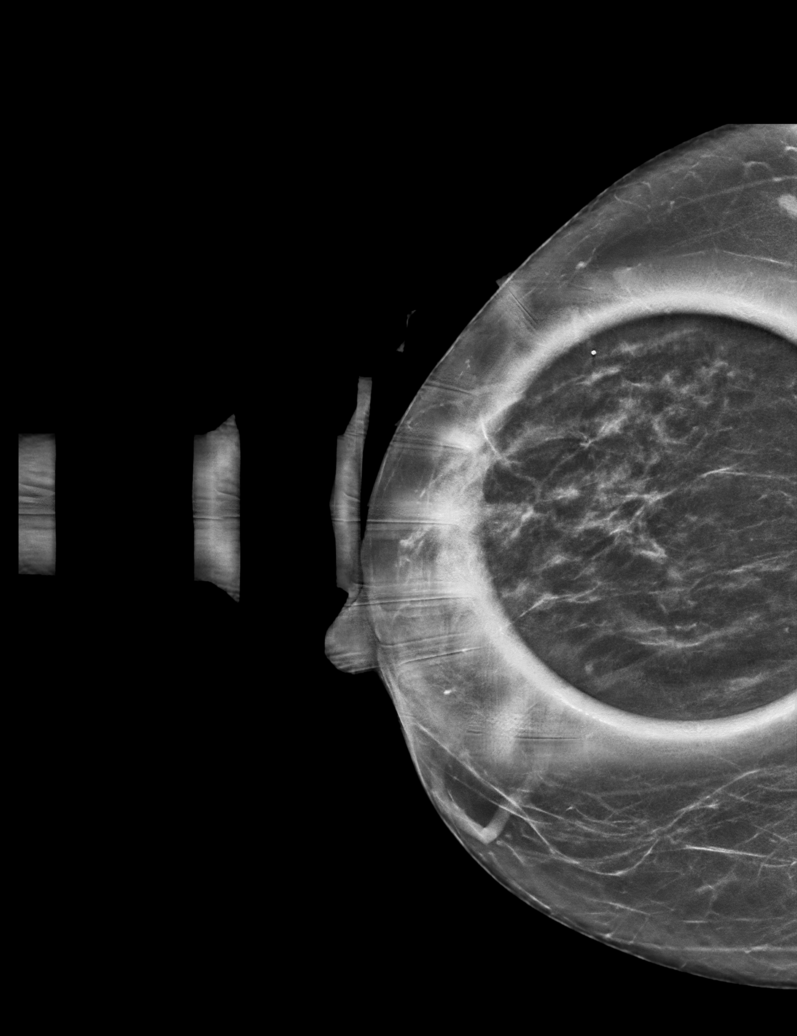

[R ML synth-2D]
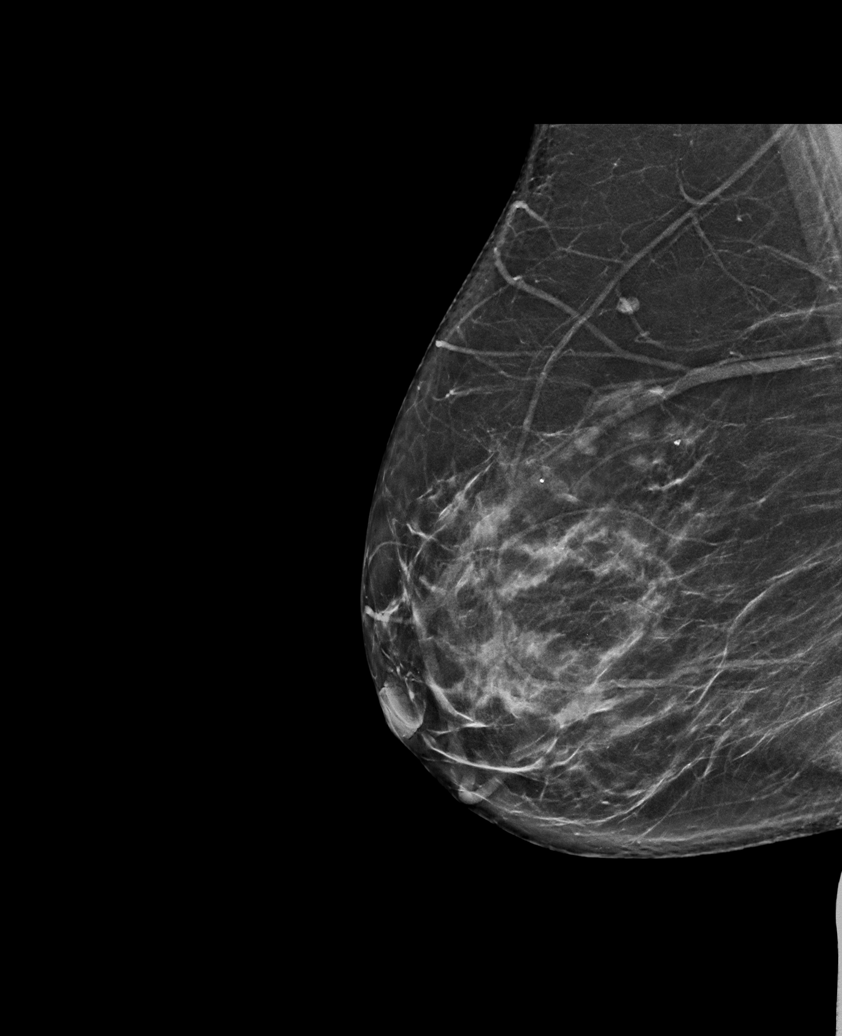

[R CC tomo · tomo slice 28/55.0]
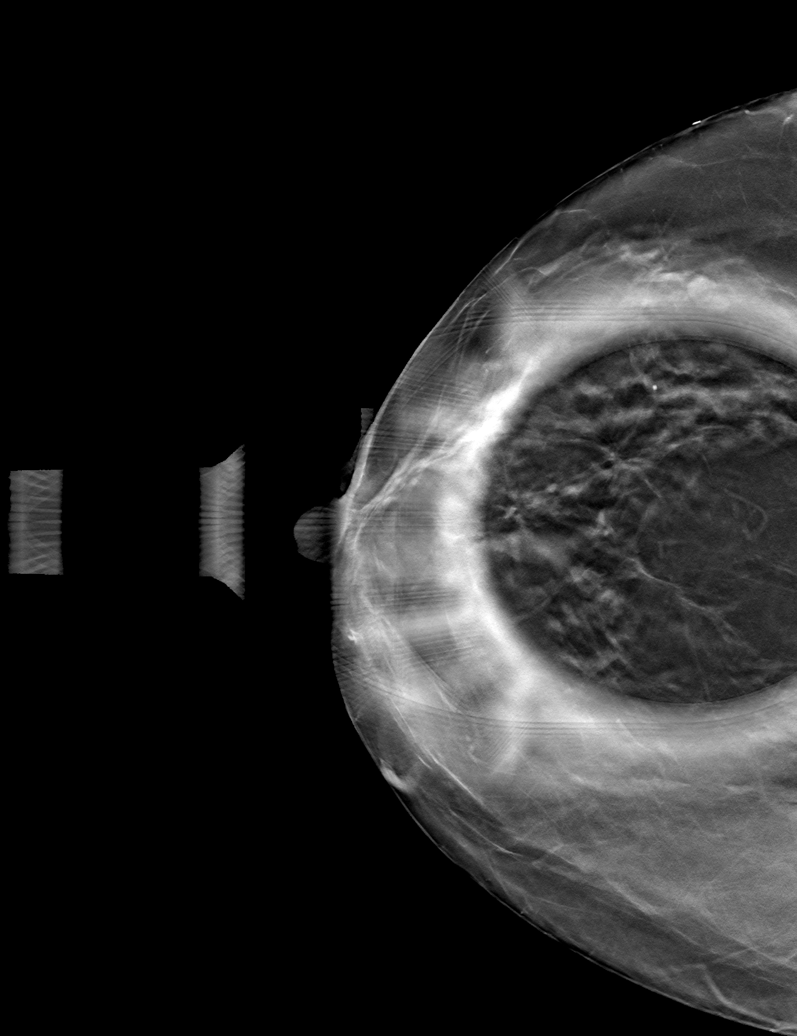

[R ML tomo · tomo slice 38/75.0]
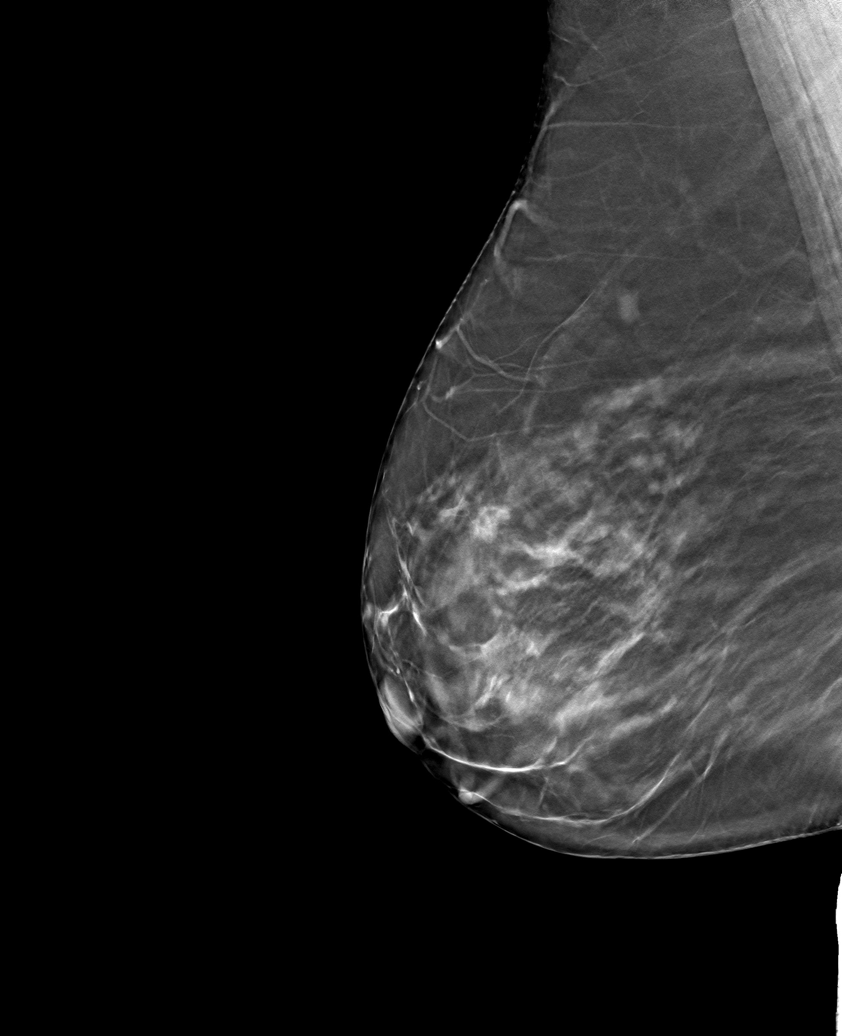

[R MLO tomo · tomo slice 31/60.0]
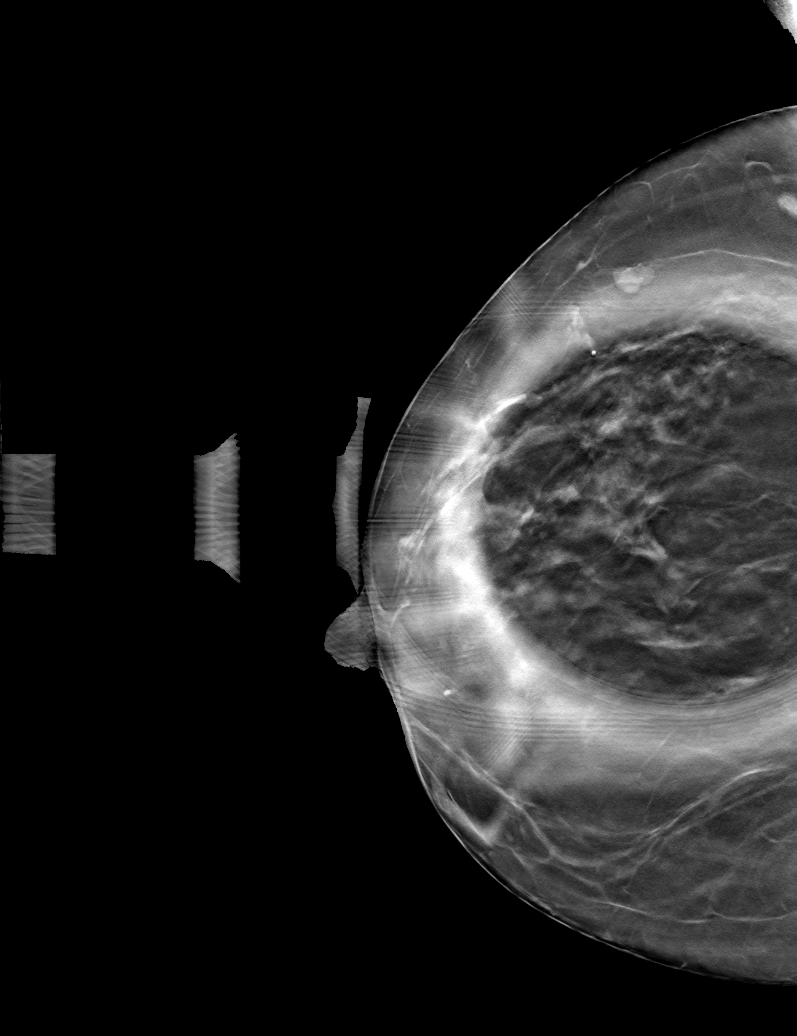

[6 of 18 positions shown; findings below may reference images not displayed]

ACR Breast Density Category b: There are scattered areas of
fibroglandular density.
FINDINGS: Focal architectural distortion persists in the upper outer right
breast seen on slice 34/60 of the spot compression MLO view and
slice 28/55 on the spot compression CC view. This distortion spans
approximately 1 cm. On a 90 degree lateral view, this distortion is
subtle but suspected to be on slice 30/75.

Targeted ultrasound is performed, showing no visible architectural
distortion or mass in the upper-outer right breast to correspond to
the small area of architectural distortion seen on the mammogram.

Ultrasound of the right axilla is negative for lymphadenopathy.
IMPRESSION: Focal architectural distortion in the upper outer right breast
without sonographic correlate.

RECOMMENDATION:
Stereotactic right breast biopsy is recommended.

I have discussed the findings and recommendations with the patient.
Results were also provided in writing at the conclusion of the
visit. If applicable, a reminder letter will be sent to the patient
regarding the next appointment.

BI-RADS CATEGORY  4: Suspicious.

## 2020-02-01 ENCOUNTER — Encounter: Payer: Medicaid Other | Admitting: Physical Therapy

## 2020-02-03 ENCOUNTER — Encounter: Payer: Medicaid Other | Admitting: Physical Therapy

## 2020-02-08 ENCOUNTER — Encounter: Payer: Medicaid Other | Admitting: Physical Therapy

## 2020-02-10 ENCOUNTER — Encounter: Payer: Medicaid Other | Admitting: Physical Therapy

## 2020-02-15 ENCOUNTER — Encounter: Payer: Medicaid Other | Admitting: Physical Therapy

## 2020-02-17 ENCOUNTER — Encounter: Payer: Medicaid Other | Admitting: Physical Therapy

## 2020-02-25 ENCOUNTER — Inpatient Hospital Stay: Payer: Medicaid Other | Attending: Oncology | Admitting: Oncology

## 2020-02-25 ENCOUNTER — Telehealth: Payer: Self-pay | Admitting: Oncology

## 2020-02-25 NOTE — Telephone Encounter (Signed)
Call placed to pt in regards to missed appointment, left VM for pt to return call to reschedule.

## 2020-02-25 NOTE — Progress Notes (Unsigned)
Called pt and got her voicemail and asked her to call me back to check the elctronic chart for updates and left my number

## 2020-02-26 ENCOUNTER — Telehealth: Payer: Self-pay | Admitting: Oncology

## 2020-02-26 NOTE — Telephone Encounter (Signed)
Call placed to pt to reschedule missed appointment, unable to reach pt. Left VM to reschedule.

## 2020-03-14 ENCOUNTER — Ambulatory Visit (INDEPENDENT_AMBULATORY_CARE_PROVIDER_SITE_OTHER): Payer: Medicaid Other | Admitting: Hospice and Palliative Medicine

## 2020-03-14 ENCOUNTER — Encounter: Payer: Self-pay | Admitting: Hospice and Palliative Medicine

## 2020-03-14 VITALS — BP 110/71 | HR 109 | Temp 97.4°F | Resp 16 | Ht 62.0 in | Wt 210.8 lb

## 2020-03-14 DIAGNOSIS — J449 Chronic obstructive pulmonary disease, unspecified: Secondary | ICD-10-CM

## 2020-03-14 DIAGNOSIS — M5441 Lumbago with sciatica, right side: Secondary | ICD-10-CM

## 2020-03-14 DIAGNOSIS — Z122 Encounter for screening for malignant neoplasm of respiratory organs: Secondary | ICD-10-CM

## 2020-03-14 DIAGNOSIS — R5383 Other fatigue: Secondary | ICD-10-CM

## 2020-03-14 DIAGNOSIS — F411 Generalized anxiety disorder: Secondary | ICD-10-CM | POA: Diagnosis not present

## 2020-03-14 DIAGNOSIS — G8929 Other chronic pain: Secondary | ICD-10-CM

## 2020-03-14 DIAGNOSIS — M5442 Lumbago with sciatica, left side: Secondary | ICD-10-CM

## 2020-03-14 DIAGNOSIS — Z8639 Personal history of other endocrine, nutritional and metabolic disease: Secondary | ICD-10-CM

## 2020-03-14 DIAGNOSIS — Z1231 Encounter for screening mammogram for malignant neoplasm of breast: Secondary | ICD-10-CM

## 2020-03-14 DIAGNOSIS — R300 Dysuria: Secondary | ICD-10-CM

## 2020-03-14 DIAGNOSIS — Z0001 Encounter for general adult medical examination with abnormal findings: Secondary | ICD-10-CM | POA: Diagnosis not present

## 2020-03-14 MED ORDER — AZITHROMYCIN 250 MG PO TABS: ORAL_TABLET | ORAL | 0 refills | Status: DC

## 2020-03-14 MED ORDER — ALPRAZOLAM 0.25 MG PO TABS: 0.2500 mg | ORAL_TABLET | Freq: Two times a day (BID) | ORAL | 0 refills | Status: DC | PRN

## 2020-03-14 MED ORDER — TIZANIDINE HCL 4 MG PO TABS: 4.0000 mg | ORAL_TABLET | Freq: Four times a day (QID) | ORAL | 0 refills | Status: DC | PRN

## 2020-03-14 NOTE — Progress Notes (Signed)
[facility] [place],  [id number]  Internal MEDICINE  Office Visit Note  Patient Name: Margaret Blackburn  [id number]  [id number]  Date of Service: 03/15/2020  Chief Complaint  Patient presents with  . Annual Exam  . Depression     HPI Pt is here for routine health maintenance examination -Continues to have ongoing chest congestion, coughing and wheezing Smoking about a pack per day -Chronic back pain--not interested in going back to pain management as she prefer to not take narcotic pain medications--has had multiple MRI scans in the past, also multiple back surgeries with metal placement Last MRI imaging September 202- thoracic spine-unable to visualize due to metallic artifact, cervical spine-degenerative change without impingement, degenerative listhesis T2-3 with right paracentral disc protrusion and bilateral facet degeneration, lumbar spine-mild left forward curvature, spondylosis notable L4-5, asymmetric left disc bulge with annular fissure and moderate left neural foraminal narrowing, also L5-S1 with left lateral recess disc protrusion which contacts the descending left S1 nerve root without impingement -History of breast CA currently taking Arimidex  BMD scan 2020-osteopenic Needs updated mammogram   Current Medication: Outpatient Encounter Medications as of 03/14/2020  Medication Sig Note  . azithromycin (ZITHROMAX) 250 MG tablet Take one tablet daily for 14 days.   . celecoxib (CELEBREX) 100 MG capsule Take 100 mg by mouth 2 (two) times daily.   Marland Kitchen tiZANidine (ZANAFLEX) 4 MG tablet Take 1 tablet (4 mg total) by mouth every 6 (six) hours as needed for muscle spasms.   Marland Kitchen acetaminophen (TYLENOL) 500 MG tablet Take 500 mg by mouth every 6 (six) hours as needed.   Marland Kitchen albuterol (VENTOLIN HFA) 108 (90 Base) MCG/ACT inhaler Inhale 2 puffs into the lungs every 6 (six) hours as needed for wheezing or shortness of breath.   . ALPRAZolam (XANAX) 0.25 MG  tablet Take 1 tablet (0.25 mg total) by mouth 2 (two) times daily as needed for anxiety.   Marland Kitchen anastrozole (ARIMIDEX) 1 MG tablet TAKE 1 TABLET BY MOUTH ONCE DAILY   . aspirin 81 MG chewable tablet Chew 81 mg by mouth daily.   . budesonide-formoterol (SYMBICORT) 160-4.5 MCG/ACT inhaler Inhale 2 puffs into the lungs 2 (two) times daily.   . Calcium Carb-Cholecalciferol (CALCIUM 600+D3 PO) Take 1 tablet by mouth in the morning and at bedtime. 08/25/2019: Pt does not know dose of vit d 3  . Melatonin 1 MG CAPS Take 1 mg by mouth at bedtime as needed.   . montelukast (SINGULAIR) 10 MG tablet Take 1 tablet (10 mg total) by mouth at bedtime.   . Multiple Vitamins-Minerals (ALIVE WOMENS ENERGY PO) Take 1 Dose by mouth daily.   . sertraline (ZOLOFT) 100 MG tablet Take 1 tablet (100 mg total) by mouth daily.   Marland Kitchen tiotropium (SPIRIVA HANDIHALER) 18 MCG inhalation capsule Place 1 capsule (18 mcg total) into inhaler and inhale daily.   . [DISCONTINUED] ALPRAZolam (XANAX) 0.25 MG tablet Take 1 tablet (0.25 mg total) by mouth 2 (two) times daily as needed for anxiety.   . [DISCONTINUED] azithromycin (ZITHROMAX Z-PAK) 250 MG tablet Take two 250 mg tablets on day one followed by one 250 mg tablet each day for four days.   . [DISCONTINUED] levofloxacin (LEVAQUIN) 500 MG tablet Take 1 tablet (500 mg total) by mouth daily.    No facility-administered encounter medications on file as of 03/14/2020.    Surgical History: Past Surgical History:  Procedure Laterality Date  . ABDOMINAL HYSTERECTOMY    . BACK  SURGERY    . BREAST BIOPSY Right 12/25/2017   affirm bx , x clip,TUBULAR  CARCINOMA  . 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 BREAST LUMPECTOMY WITH NEEDLE LOCALIZATION Right 03/03/2018   Procedure: BREAST LUMPECTOMY WITH NEEDLE LOCALIZATION;  Surgeon: Vickie Epley, MD;  Location: [facility] ORS;  Service: General;  Laterality: Right;  . COLONOSCOPY WITH PROPOFOL N/A 08/31/2019   Procedure:  COLONOSCOPY WITH PROPOFOL;  Surgeon: Jonathon Bellows, MD;  Location: [facility] ENDOSCOPY;  Service: Gastroenterology;  Laterality: N/A;  . SENTINEL NODE BIOPSY Right 03/21/2018   Procedure: RIGHT SENTINEL LYMPH  NODE BIOPSY;  Surgeon: Vickie Epley, MD;  Location: [facility] ORS;  Service: General;  Laterality: Right;    Medical History: Past Medical History:  Diagnosis Date  . Allergy   . Arthritis   . Breast cancer ([place]) 12/2017   right breast  . COPD (chronic obstructive pulmonary disease) ([place])   . Depression   . Personal history of radiation therapy   . Prediabetes   . Sleep apnea    supposed to get CPAP 03/25/19    Family History: Family History  Problem Relation Age of Onset  . Breast cancer Mother 17  . Lung cancer Mother   . Breast cancer Maternal Grandmother   . Thyroid cancer Father       Review of Systems  Constitutional: Negative for chills, diaphoresis and fatigue.  HENT: Positive for congestion. Negative for ear pain, postnasal drip and sinus pressure.   Eyes: Negative for photophobia, discharge, redness, itching and visual disturbance.  Respiratory: Positive for cough, shortness of breath and wheezing.   Cardiovascular: Negative for chest pain, palpitations and leg swelling.  Gastrointestinal: Negative for abdominal pain, constipation, diarrhea, nausea and vomiting.  Genitourinary: Negative for dysuria and flank pain.  Musculoskeletal: Positive for arthralgias and back pain. Negative for gait problem and neck pain.  Skin: Negative for color change.  Allergic/Immunologic: Negative for environmental allergies and food allergies.  Neurological: Negative for dizziness and headaches.  Hematological: Does not bruise/bleed easily.  Psychiatric/Behavioral: Negative for agitation, behavioral problems (depression) and hallucinations. The patient is nervous/anxious.      Vital Signs: BP 110/71   Pulse (!) 109   Temp (!) 97.4 F (36.3 C)   Resp 16   Ht 5\' 2"  (1.575 m)    Wt 210 lb 12.8 oz (95.6 kg)   SpO2 97%   BMI 38.56 kg/m    Physical Exam Vitals reviewed.  Constitutional:      Appearance: Normal appearance. She is obese.  Cardiovascular:     Rate and Rhythm: Normal rate and regular rhythm.     Pulses: Normal pulses.     Heart sounds: Normal heart sounds.  Pulmonary:     Breath sounds: Examination of the right-upper field reveals wheezing. Examination of the left-upper field reveals wheezing. Examination of the right-lower field reveals wheezing. Examination of the left-lower field reveals wheezing. Wheezing present.  Abdominal:     General: Abdomen is flat.     Palpations: Abdomen is soft.  Musculoskeletal:        General: Normal range of motion.     Cervical back: Normal range of motion.  Skin:    General: Skin is warm.  Neurological:     General: No focal deficit present.     Mental Status: She is alert and oriented to person, place, and time. Mental status is at baseline.  Psychiatric:        Mood and Affect:  Mood normal.        Behavior: Behavior normal.        Thought Content: Thought content normal.        Judgment: Judgment normal.      LABS: Recent Results (from the past 2160 hour(s))  POCT Urine Drug Screen     Status: Abnormal   Collection Time: 12/22/19  4:28 PM  Result Value Ref Range   POC Methamphetamine UR None Detected None Detected   POC Opiate Ur None Detected None Detected   POC Barbiturate UR None Detected None Detected   POC Amphetamine UR None Detected None Detected   POC Oxycodone UR None Detected None Detected   POC Cocaine UR None Detected None Detected   POC Ecstasy UR None Detected None Detected   POC TRICYCLICS UR Positive (A) None Detected   POC PHENCYCLIDINE UR None Detected None Detected   POC Marijuana UR Positive (A) None Detected   POC Methadone UR None Detected None Detected   POC BENZODIAZEPINES UR None Detected None Detected   URINE TEMPERATURE     POC DRUG SCREEN OXIDANTS URINE     POC  SPECIFIC GRAVITY URINE     POC PH URINE     Methylenedioxyamphetamine    Pulmonary function test     Status: None   Collection Time: 01/13/20 12:00 AM  Result Value Ref Range   FEV1     FVC     FEV1/FVC     TLC     DLCO    UA/M w/rflx Culture, Routine     Status: Abnormal (Preliminary result)   Collection Time: 03/14/20  8:37 AM   Specimen: Urine   Urine  Result Value Ref Range   Specific Gravity, UA 1.022 1.005 - 1.030   pH, UA 5.5 5.0 - 7.5   Color, UA Yellow Yellow   Appearance Ur Turbid (A) Clear   Leukocytes,UA Negative Negative   Protein,UA Negative Negative/Trace   Glucose, UA Negative Negative   Ketones, UA Negative Negative   RBC, UA Negative Negative   Bilirubin, UA Negative Negative   Urobilinogen, Ur 0.2 0.2 - 1.0 mg/dL   Nitrite, UA Negative Negative   Microscopic Examination Comment     Comment: Microscopic follows if indicated.   Microscopic Examination See below:     Comment: Microscopic was indicated and was performed.   Urinalysis Reflex Comment     Comment: This specimen has reflexed to a Urine Culture.  Microscopic Examination     Status: Abnormal   Collection Time: 03/14/20  8:37 AM   Urine  Result Value Ref Range   WBC, UA 0-5 0 - 5 /hpf   RBC None seen 0 - 2 /hpf   Epithelial Cells (non renal) >10 (A) 0 - 10 /hpf   Casts Present (A) None seen /lpf   Cast Type Epithelial cell casts (A) N/A   Bacteria, UA Moderate (A) None seen/Few  Urine Culture, Reflex     Status: None (Preliminary result)   Collection Time: 03/14/20  8:37 AM   Urine  Result Value Ref Range   Urine Culture, Routine WILL FOLLOW    Assessment/Plan: 1. Encounter for routine adult health examination with abnormal findings Well appearing 55 year old female Will review routine labs and adjust therapy as needed Will updated PHM - CBC w/Diff/Platelet - Comprehensive Metabolic Panel (CMET) - Lipid Panel With LDL/HDL Ratio - TSH + free T4 - DG Bone Density; Future  2.  Encounter for screening  mammogram for malignant neoplasm of breast - MM Digital Screening; Future  3. GAD (generalized anxiety disorder) Continue with Zoloft and alprazolam as needed - ALPRAZolam (XANAX) 0.25 MG tablet; Take 1 tablet (0.25 mg total) by mouth 2 (two) times daily as needed for anxiety.  Dispense: 30 tablet; Refill: 0   4. Encounter for screening for lung cancer - Ambulatory Referral for Lung Cancer Scre  5. Chronic obstructive pulmonary disease, unspecified COPD type ([place]) Acute exacerbation, treat with extended course of azithromycin Will need updated PFT - azithromycin (ZITHROMAX) 250 MG tablet; Take one tablet daily for 14 days.  Dispense: 14 tablet; Refill: 0  6. Chronic midline low back pain with bilateral sciatica Continue with Celebrex, add tizanidine as needed for ongoing pain - tiZANidine (ZANAFLEX) 4 MG tablet; Take 1 tablet (4 mg total) by mouth every 6 (six) hours as needed for muscle spasms.  Dispense: 30 tablet; Refill: 0 - celecoxib (CELEBREX) 100 MG capsule; Take 100 mg by mouth 2 (two) times daily.  7. History of primary ovarian failure Updated BMD due to history of osteopenia, add therapy if disease progressed - DG Bone Density; Future  8. Dysuria - UA/M w/rflx Culture, Routine - Microscopic Examination - Urine Culture, Reflex  9. Other fatigue - CBC w/Diff/Platelet - Comprehensive Metabolic Panel (CMET) - Lipid Panel With LDL/HDL Ratio - TSH + free T4  General Counseling: Margaret Blackburn verbalizes understanding of the findings of todays visit and agrees with plan of treatment. I have discussed any further diagnostic evaluation that may be needed or ordered today. We also reviewed her medications today. she has been encouraged to call the office with any questions or concerns that should arise related to todays visit.    Counseling: [place] Controlled Substance Database was reviewed by me for overdose risk score (ORS) Reviewed risks and possible side  effects associated with taking opiates, benzodiazepines and other CNS depressants. Combination of these could cause dizziness and drowsiness. Advised patient not to drive or operate machinery when taking these medications, as patient's and other's life can be at risk and will have consequences. Patient verbalized understanding in this matter. Dependence and abuse for these drugs will be monitored closely. A Controlled substance policy and procedure is on file which allows [facility] to order a urine drug screen test at any visit. Patient understands and agrees with the plan   Orders Placed This Encounter  Procedures  . Microscopic Examination  . Urine Culture, Reflex  . MM Digital Screening  . DG Bone Density  . UA/M w/rflx Culture, Routine  . CBC w/Diff/Platelet  . Comprehensive Metabolic Panel (CMET)  . Lipid Panel With LDL/HDL Ratio  . TSH + free T4  . Ambulatory Referral for Lung Cancer Scre    Meds ordered this encounter  Medications  . ALPRAZolam (XANAX) 0.25 MG tablet    Sig: Take 1 tablet (0.25 mg total) by mouth 2 (two) times daily as needed for anxiety.    Dispense:  30 tablet    Refill:  0  . tiZANidine (ZANAFLEX) 4 MG tablet    Sig: Take 1 tablet (4 mg total) by mouth every 6 (six) hours as needed for muscle spasms.    Dispense:  30 tablet    Refill:  0  . azithromycin (ZITHROMAX) 250 MG tablet    Sig: Take one tablet daily for 14 days.    Dispense:  14 tablet    Refill:  0    Total time spent: 35 Minutes  Time spent includes review of chart, medications, test results, and follow up plan with the patient.   This patient was seen by Theodoro Grist AGNP-C Collaboration with Dr Lavera Guise as a part of collaborative care agreement   Margaret Blackburn. [facility] Internal Medicine

## 2020-03-15 ENCOUNTER — Encounter: Payer: Self-pay | Admitting: Hospice and Palliative Medicine

## 2020-03-15 ENCOUNTER — Telehealth: Payer: Self-pay | Admitting: *Deleted

## 2020-03-15 DIAGNOSIS — Z87891 Personal history of nicotine dependence: Secondary | ICD-10-CM

## 2020-03-15 DIAGNOSIS — F17200 Nicotine dependence, unspecified, uncomplicated: Secondary | ICD-10-CM

## 2020-03-15 DIAGNOSIS — Z122 Encounter for screening for malignant neoplasm of respiratory organs: Secondary | ICD-10-CM

## 2020-03-15 NOTE — Telephone Encounter (Signed)
Received referral for initial lung cancer screening scan. Contacted patient and obtained smoking history,(current, 78 pack year) as well as answering questions related to screening process. Patient denies signs of lung cancer such as weight loss or hemoptysis. Patient denies comorbidity that would prevent curative treatment if lung cancer were found. Patient is scheduled for shared decision making visit and CT scan on 03/30/20 at 10am.

## 2020-03-18 ENCOUNTER — Other Ambulatory Visit: Payer: Self-pay | Admitting: Hospice and Palliative Medicine

## 2020-03-18 ENCOUNTER — Telehealth: Payer: Self-pay

## 2020-03-18 LAB — UA/M W/RFLX CULTURE, ROUTINE
Bilirubin, UA: NEGATIVE
Glucose, UA: NEGATIVE
Ketones, UA: NEGATIVE
Leukocytes,UA: NEGATIVE
Nitrite, UA: NEGATIVE
Protein,UA: NEGATIVE
RBC, UA: NEGATIVE
Specific Gravity, UA: 1.022 (ref 1.005–1.030)
Urobilinogen, Ur: 0.2 mg/dL (ref 0.2–1.0)
pH, UA: 5.5 (ref 5.0–7.5)

## 2020-03-18 LAB — URINE CULTURE, REFLEX

## 2020-03-18 LAB — MICROSCOPIC EXAMINATION
Epithelial Cells (non renal): >10 /hpf — AB (ref 0–10)
RBC, Urine: NONE SEEN /hpf (ref 0–2)

## 2020-03-18 MED ORDER — SULFAMETHOXAZOLE-TRIMETHOPRIM 800-160 MG PO TABS: 1.0000 | ORAL_TABLET | Freq: Two times a day (BID) | ORAL | 0 refills | Status: DC

## 2020-03-18 NOTE — Telephone Encounter (Signed)
-----   Message from Luiz Ochoa, NP sent at 03/18/2020  2:23 PM EST ----- Please call and inform patient that she has evidence of bacterial growth on her urine culture. Bactrim sent to her pharmacy.

## 2020-03-18 NOTE — Progress Notes (Signed)
Please call and inform patient that she has evidence of bacterial growth on her urine culture. Bactrim sent to her pharmacy.

## 2020-03-18 NOTE — Telephone Encounter (Signed)
PT notified

## 2020-03-23 LAB — COMPREHENSIVE METABOLIC PANEL
ALT: 23 IU/L (ref 0–32)
AST: 16 IU/L (ref 0–40)
Albumin/Globulin Ratio: 1.7 (ref 1.2–2.2)
Albumin: 4.5 g/dL (ref 3.8–4.9)
Alkaline Phosphatase: 87 IU/L (ref 44–121)
BUN/Creatinine Ratio: 13 (ref 9–23)
BUN: 11 mg/dL (ref 6–24)
Bilirubin Total: 0.3 mg/dL (ref 0.0–1.2)
CO2: 23 mmol/L (ref 20–29)
Calcium: 9.6 mg/dL (ref 8.7–10.2)
Chloride: 100 mmol/L (ref 96–106)
Creatinine, Ser: 0.88 mg/dL (ref 0.57–1.00)
GFR calc Af Amer: 87 mL/min/{1.73_m2} (ref >59)
GFR calc non Af Amer: 75 mL/min/{1.73_m2} (ref >59)
Globulin, Total: 2.7 g/dL (ref 1.5–4.5)
Glucose: 86 mg/dL (ref 65–99)
Potassium: 5.1 mmol/L (ref 3.5–5.2)
Sodium: 138 mmol/L (ref 134–144)
Total Protein: 7.2 g/dL (ref 6.0–8.5)

## 2020-03-23 LAB — CBC WITH DIFFERENTIAL/PLATELET
Basophils Absolute: 0.1 10*3/uL (ref 0.0–0.2)
Basos: 1 %
EOS (ABSOLUTE): 0.3 10*3/uL (ref 0.0–0.4)
Eos: 3 %
Hematocrit: 43.4 % (ref 34.0–46.6)
Hemoglobin: 14.2 g/dL (ref 11.1–15.9)
Immature Grans (Abs): 0 10*3/uL (ref 0.0–0.1)
Immature Granulocytes: 1 %
Lymphocytes Absolute: 1.9 10*3/uL (ref 0.7–3.1)
Lymphs: 22 %
MCH: 29.8 pg (ref 26.6–33.0)
MCHC: 32.7 g/dL (ref 31.5–35.7)
MCV: 91 fL (ref 79–97)
Monocytes Absolute: 0.6 10*3/uL (ref 0.1–0.9)
Monocytes: 7 %
Neutrophils Absolute: 5.8 10*3/uL (ref 1.4–7.0)
Neutrophils: 66 %
Platelets: 309 10*3/uL (ref 150–450)
RBC: 4.77 x10E6/uL (ref 3.77–5.28)
RDW: 13.1 % (ref 11.7–15.4)
WBC: 8.6 10*3/uL (ref 3.4–10.8)

## 2020-03-23 LAB — TSH+FREE T4
Free T4: 1.1 ng/dL (ref 0.82–1.77)
TSH: 0.934 u[IU]/mL (ref 0.450–4.500)

## 2020-03-23 LAB — LIPID PANEL WITH LDL/HDL RATIO
Cholesterol, Total: 210 mg/dL — ABNORMAL HIGH (ref 100–199)
HDL: 54 mg/dL (ref >39)
LDL Chol Calc (NIH): 113 mg/dL — ABNORMAL HIGH (ref 0–99)
LDL/HDL Ratio: 2.1 ratio (ref 0.0–3.2)
Triglycerides: 251 mg/dL — ABNORMAL HIGH (ref 0–149)
VLDL Cholesterol Cal: 43 mg/dL — ABNORMAL HIGH (ref 5–40)

## 2020-03-23 NOTE — Progress Notes (Signed)
Labs reviewed, will discuss at upcoming visit.

## 2020-03-24 ENCOUNTER — Ambulatory Visit: Payer: Medicaid Other | Admitting: Hospice and Palliative Medicine

## 2020-03-28 ENCOUNTER — Encounter: Payer: Self-pay | Admitting: *Deleted

## 2020-03-30 ENCOUNTER — Inpatient Hospital Stay: Payer: Medicaid Other | Admitting: Oncology

## 2020-03-30 ENCOUNTER — Ambulatory Visit: Payer: Medicaid Other

## 2020-04-04 ENCOUNTER — Telehealth: Payer: Self-pay | Admitting: Oncology

## 2020-04-04 NOTE — Telephone Encounter (Signed)
Left VM with patient to update her on status of Mammo/US scheduled for 3/2 that is still awaiting insurance approval.

## 2020-04-06 ENCOUNTER — Other Ambulatory Visit: Payer: Medicaid Other

## 2020-04-06 ENCOUNTER — Other Ambulatory Visit: Payer: Self-pay

## 2020-04-06 ENCOUNTER — Other Ambulatory Visit: Payer: Self-pay | Admitting: Hospice and Palliative Medicine

## 2020-04-06 DIAGNOSIS — J449 Chronic obstructive pulmonary disease, unspecified: Secondary | ICD-10-CM

## 2020-04-06 DIAGNOSIS — Z9109 Other allergy status, other than to drugs and biological substances: Secondary | ICD-10-CM

## 2020-04-06 MED ORDER — MONTELUKAST SODIUM 10 MG PO TABS: 10.0000 mg | ORAL_TABLET | Freq: Every day | ORAL | 3 refills | Status: DC

## 2020-04-13 ENCOUNTER — Encounter: Payer: Self-pay | Admitting: Oncology

## 2020-04-13 ENCOUNTER — Other Ambulatory Visit: Payer: Self-pay

## 2020-04-13 ENCOUNTER — Ambulatory Visit
Admission: RE | Admit: 2020-04-13 | Discharge: 2020-04-13 | Disposition: A | Discharge status: Home / Self Care | Payer: Medicaid Other | Source: Ambulatory Visit | Attending: Oncology | Admitting: Oncology

## 2020-04-13 ENCOUNTER — Inpatient Hospital Stay: Payer: Medicaid Other | Attending: Oncology | Admitting: Oncology

## 2020-04-13 DIAGNOSIS — F17210 Nicotine dependence, cigarettes, uncomplicated: Secondary | ICD-10-CM

## 2020-04-13 DIAGNOSIS — Z87891 Personal history of nicotine dependence: Secondary | ICD-10-CM | POA: Diagnosis not present

## 2020-04-13 DIAGNOSIS — F17200 Nicotine dependence, unspecified, uncomplicated: Secondary | ICD-10-CM | POA: Diagnosis present

## 2020-04-13 DIAGNOSIS — Z720 Tobacco use: Secondary | ICD-10-CM

## 2020-04-13 DIAGNOSIS — Z122 Encounter for screening for malignant neoplasm of respiratory organs: Secondary | ICD-10-CM | POA: Diagnosis present

## 2020-04-13 DIAGNOSIS — Z01818 Encounter for other preprocedural examination: Secondary | ICD-10-CM

## 2020-04-13 NOTE — Progress Notes (Signed)
Virtual Visit via Video Note  I connected with Mrs. Cremeans  on 04/13/20 at  9:15 AM EST by a video enabled telemedicine application and verified that I am speaking with the correct person using two identifiers.  Location: Patient: [facility] Provider: Clinic   I discussed the limitations of evaluation and management by telemedicine and the availability of in person appointments. The patient expressed understanding and agreed to proceed.  I discussed the assessment and treatment plan with the patient. The patient was provided an opportunity to ask questions and all were answered. The patient agreed with the plan and demonstrated an understanding of the instructions.   The patient was advised to call back or seek an in-person evaluation if the symptoms worsen or if the condition fails to improve as anticipated.   In accordance with CMS guidelines, patient has met eligibility criteria including age, absence of signs or symptoms of lung cancer.  Social History   Tobacco Use  . Smoking status: Current Every Day Smoker    Packs/day: 1.00    Years: 30.00    Pack years: 30.00    Types: Cigarettes  . Smokeless tobacco: Never Used  . Tobacco comment: in the process of trying to quit-chantix  Vaping Use  . Vaping Use: Former  . Devices: used  2 weeks per pt  Substance Use Topics  . Alcohol use: No  . Drug use: Yes    Types: Marijuana    Comment: occ      A shared decision-making session was conducted prior to the performance of CT scan. This includes one or more decision aids, includes benefits and harms of screening, follow-up diagnostic testing, over-diagnosis, false positive rate, and total radiation exposure.   Counseling on the importance of adherence to annual lung cancer LDCT screening, impact of co-morbidities, and ability or willingness to undergo diagnosis and treatment is imperative for compliance of the program.   Counseling on the importance of continued smoking cessation for  former smokers; the importance of smoking cessation for current smokers, and information about tobacco cessation interventions have been given to patient including [place] and [employer] quit [place] programs.   Written order for lung cancer screening with LDCT has been given to the patient and any and all questions have been answered to the best of my abilities.    Yearly follow up will be coordinated by Burgess Estelle, Thoracic Navigator.  I provided 15 minutes of face-to-face video visit time during this encounter, and > 50% was spent counseling as documented under my assessment & plan.   Jacquelin Hawking, NP

## 2020-04-14 ENCOUNTER — Encounter: Payer: Self-pay | Admitting: Hospice and Palliative Medicine

## 2020-04-14 ENCOUNTER — Telehealth: Payer: Self-pay

## 2020-04-14 ENCOUNTER — Ambulatory Visit (INDEPENDENT_AMBULATORY_CARE_PROVIDER_SITE_OTHER): Payer: Medicaid Other | Admitting: Hospice and Palliative Medicine

## 2020-04-14 VITALS — BP 120/72 | HR 95 | Temp 97.8°F | Ht 62.0 in | Wt 210.2 lb

## 2020-04-14 DIAGNOSIS — E782 Mixed hyperlipidemia: Secondary | ICD-10-CM

## 2020-04-14 DIAGNOSIS — J449 Chronic obstructive pulmonary disease, unspecified: Secondary | ICD-10-CM

## 2020-04-14 DIAGNOSIS — M5441 Lumbago with sciatica, right side: Secondary | ICD-10-CM

## 2020-04-14 DIAGNOSIS — F411 Generalized anxiety disorder: Secondary | ICD-10-CM | POA: Diagnosis not present

## 2020-04-14 DIAGNOSIS — G8929 Other chronic pain: Secondary | ICD-10-CM

## 2020-04-14 DIAGNOSIS — F17200 Nicotine dependence, unspecified, uncomplicated: Secondary | ICD-10-CM | POA: Diagnosis not present

## 2020-04-14 DIAGNOSIS — Z79899 Other long term (current) drug therapy: Secondary | ICD-10-CM

## 2020-04-14 DIAGNOSIS — Z6838 Body mass index (BMI) 38.0-38.9, adult: Secondary | ICD-10-CM | POA: Diagnosis not present

## 2020-04-14 DIAGNOSIS — G4709 Other insomnia: Secondary | ICD-10-CM

## 2020-04-14 DIAGNOSIS — M5442 Lumbago with sciatica, left side: Secondary | ICD-10-CM

## 2020-04-14 MED ORDER — TRAZODONE HCL 50 MG PO TABS
25.0000 mg | ORAL_TABLET | Freq: Every evening | ORAL | 0 refills | Status: DC | PRN
Start: 2020-04-14 — End: 2020-05-17

## 2020-04-14 MED ORDER — PREGABALIN 25 MG PO CAPS: 25.0000 mg | ORAL_CAPSULE | Freq: Two times a day (BID) | ORAL | 0 refills | Status: DC

## 2020-04-14 MED ORDER — ALPRAZOLAM 0.25 MG PO TABS: 0.2500 mg | ORAL_TABLET | Freq: Two times a day (BID) | ORAL | 0 refills | Status: DC | PRN

## 2020-04-14 MED ORDER — PREDNISONE 10 MG PO TABS
ORAL_TABLET | ORAL | 0 refills | Status: DC
Start: 2020-04-14 — End: 2020-05-13

## 2020-04-14 MED ORDER — LEVOFLOXACIN 500 MG PO TABS: 500.0000 mg | ORAL_TABLET | Freq: Every day | ORAL | 0 refills | Status: DC

## 2020-04-14 MED ORDER — BUSPIRONE HCL 5 MG PO TABS: 5.0000 mg | ORAL_TABLET | Freq: Three times a day (TID) | ORAL | 0 refills | Status: DC

## 2020-04-14 MED ORDER — ROSUVASTATIN CALCIUM 5 MG PO TABS: 5.0000 mg | ORAL_TABLET | Freq: Every day | ORAL | 3 refills | Status: DC

## 2020-04-14 NOTE — Telephone Encounter (Signed)
Gave [employer] orders for pulse oximetry, overnight JS

## 2020-04-14 NOTE — Progress Notes (Signed)
[facility] [place], [place] [address]  Internal MEDICINE  Office Visit Note  Patient Name: Margaret Blackburn  [id number]  [id number]  Date of Service: 04/16/2020  Chief Complaint  Patient presents with  . Follow-up    Review lab results   . Ear Pain    Right ear has been bothering her; especially after coughing, right ear has a thumping sound.    HPI Patient is here for routine follow-up -SHOB, congestion, wheezing, coughing--treated at last visit with azithromycin, completed course of antibiotics but has yet to notice an improvement in her symptoms Coughing and wheezing seem to be worsening Continues to smoke about 1-1.5 packs of cigarettes per day  PFT needs updating Had low dose chest CT--awaiting results  -Back pain, chronic pain, wanting to avoid pain clinic and narcotic pain medications, added tizanidine at last visit without any improvement in her symptoms Has tried gabapentin in the past but was unable to tolerate as it caused confusion and drowsiness  -Anxiety and insomnia--has used alprazolam as needed for many years to help control symptoms, cannot recall trying alternative therapies to help with insomnia Has been on Zoloft for many years that does help control symptoms--anxiety and panic attacks are being exacerbated by [facility] and wheezing  Mammogram and BMD scheduled for later this months  Reviewed labs ordered at CPE Abnormal lipid panel    Current Medication: Outpatient Encounter Medications as of 04/14/2020  Medication Sig Note  . acetaminophen (TYLENOL) 500 MG tablet Take 500 mg by mouth every 6 (six) hours as needed.   Marland Kitchen albuterol (VENTOLIN HFA) 108 (90 Base) MCG/ACT inhaler Inhale 2 puffs into the lungs every 6 (six) hours as needed for wheezing or shortness of breath.   . anastrozole (ARIMIDEX) 1 MG tablet TAKE 1 TABLET BY MOUTH ONCE DAILY   . aspirin 81 MG chewable tablet Chew 81 mg by mouth daily.   Marland Kitchen aspirin EC 81 MG tablet  Take 81 mg by mouth daily. Swallow whole.   . busPIRone (BUSPAR) 5 MG tablet Take 1 tablet (5 mg total) by mouth 3 (three) times daily.   . Calcium Carb-Cholecalciferol (CALCIUM 600+D3 PO) Take 1 tablet by mouth in the morning and at bedtime. 08/25/2019: Pt does not know dose of vit d 3  . celecoxib (CELEBREX) 100 MG capsule Take 100 mg by mouth 2 (two) times daily.   Marland Kitchen levofloxacin (LEVAQUIN) 500 MG tablet Take 1 tablet (500 mg total) by mouth daily.   . Melatonin 1 MG CAPS Take 1 mg by mouth at bedtime as needed.   . montelukast (SINGULAIR) 10 MG tablet Take 1 tablet (10 mg total) by mouth at bedtime.   . Multiple Vitamins-Minerals (ALIVE WOMENS ENERGY PO) Take 1 Dose by mouth daily.   . predniSONE (DELTASONE) 10 MG tablet Take 1 tablet three times a day with a meal for three for three days, take 1 tablet by twice daily with a meal for 3 days, take 1 tablet once daily with a meal for 3 days   . pregabalin (LYRICA) 25 MG capsule Take 1 capsule (25 mg total) by mouth 2 (two) times daily.   . rosuvastatin (CRESTOR) 5 MG tablet Take 1 tablet (5 mg total) by mouth daily.   . sertraline (ZOLOFT) 100 MG tablet Take 1 tablet (100 mg total) by mouth daily.   . SYMBICORT 160-4.5 MCG/ACT inhaler INHALE 2 PUFFS INTO THE LUNGS 2 TIMES DAILY   . tiotropium (SPIRIVA HANDIHALER) 18 MCG  inhalation capsule Place 1 capsule (18 mcg total) into inhaler and inhale daily.   Marland Kitchen tiZANidine (ZANAFLEX) 4 MG tablet Take 1 tablet (4 mg total) by mouth every 6 (six) hours as needed for muscle spasms.   . traZODone (DESYREL) 50 MG tablet Take 0.5-1 tablets (25-50 mg total) by mouth at bedtime as needed for sleep.   . [DISCONTINUED] ALPRAZolam (XANAX) 0.25 MG tablet Take 1 tablet (0.25 mg total) by mouth 2 (two) times daily as needed for anxiety.   . [DISCONTINUED] azithromycin (ZITHROMAX) 250 MG tablet Take one tablet daily for 14 days.   . [DISCONTINUED] sulfamethoxazole-trimethoprim (BACTRIM DS) 800-160 MG tablet Take 1 tablet  by mouth 2 (two) times daily.   Marland Kitchen ALPRAZolam (XANAX) 0.25 MG tablet Take 1 tablet (0.25 mg total) by mouth 2 (two) times daily as needed for anxiety.    No facility-administered encounter medications on file as of 04/14/2020.    Surgical History: Past Surgical History:  Procedure Laterality Date  . ABDOMINAL HYSTERECTOMY    . BACK SURGERY    . BREAST BIOPSY Right 12/25/2017   affirm bx , x clip,TUBULAR  CARCINOMA  . 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 BREAST LUMPECTOMY WITH NEEDLE LOCALIZATION Right 03/03/2018   Procedure: BREAST LUMPECTOMY WITH NEEDLE LOCALIZATION;  Surgeon: Vickie Epley, MD;  Location: [facility] ORS;  Service: General;  Laterality: Right;  . COLONOSCOPY WITH PROPOFOL N/A 08/31/2019   Procedure: COLONOSCOPY WITH PROPOFOL;  Surgeon: Jonathon Bellows, MD;  Location: [facility] ENDOSCOPY;  Service: Gastroenterology;  Laterality: N/A;  . SENTINEL NODE BIOPSY Right 03/21/2018   Procedure: RIGHT SENTINEL LYMPH  NODE BIOPSY;  Surgeon: Vickie Epley, MD;  Location: [facility] ORS;  Service: General;  Laterality: Right;    Medical History: Past Medical History:  Diagnosis Date  . Allergy   . Arthritis   . Breast cancer ([place]) 12/2017   right breast  . COPD (chronic obstructive pulmonary disease) ([place])   . Depression   . Personal history of radiation therapy   . Prediabetes   . Sleep apnea    supposed to get CPAP 03/25/19    Family History: Family History  Problem Relation Age of Onset  . Breast cancer Mother 68  . Lung cancer Mother   . Breast cancer Maternal Grandmother   . Thyroid cancer Father     Social History   Socioeconomic History  . Marital status: Single    Spouse name: Not on file  . Number of children: 2  . Years of education: Not on file  . Highest education level: Not on file  Occupational History  . Not on file  Tobacco Use  . Smoking status: Current Every Day Smoker    Packs/day: 2.00    Years: 39.00    Pack years: 78.00     Types: Cigarettes  . Smokeless tobacco: Never Used  . Tobacco comment: in the process of trying to quit-chantix  Vaping Use  . Vaping Use: Former  . Devices: used  2 weeks per pt  Substance and Sexual Activity  . Alcohol use: No  . Drug use: Yes    Types: Marijuana    Comment: occ  . Sexual activity: Yes  Other Topics Concern  . Not on file  Social History Narrative  . Not on file   Social Determinants of Health   Financial Resource Strain: Not on file  Food Insecurity: Not on file  Transportation Needs: Not on file  Physical Activity: Not  on file  Stress: Not on file  Social Connections: Not on file  Intimate Partner Violence: Not on file      Review of Systems  Constitutional: Negative for chills, diaphoresis and fatigue.  HENT: Positive for congestion. Negative for ear pain, postnasal drip and sinus pressure.   Eyes: Negative for photophobia, discharge, redness, itching and visual disturbance.  Respiratory: Positive for cough, shortness of breath and wheezing.   Cardiovascular: Negative for chest pain, palpitations and leg swelling.  Gastrointestinal: Negative for abdominal pain, constipation, diarrhea, nausea and vomiting.  Genitourinary: Negative for dysuria and flank pain.  Musculoskeletal: Positive for arthralgias and back pain. Negative for gait problem and neck pain.  Skin: Negative for color change.  Allergic/Immunologic: Negative for environmental allergies and food allergies.  Neurological: Negative for dizziness and headaches.  Hematological: Does not bruise/bleed easily.  Psychiatric/Behavioral: Positive for behavioral problems (depression) and sleep disturbance. Negative for agitation and hallucinations. The patient is nervous/anxious.     Vital Signs: BP 120/72   Pulse 95   Temp 97.8 F (36.6 C)   Ht 5\' 2"  (1.575 m)   Wt 210 lb 3.2 oz (95.3 kg)   SpO2 97%   BMI 38.45 kg/m    Physical Exam Vitals reviewed.  Constitutional:      Appearance:  Normal appearance. She is obese.  Cardiovascular:     Rate and Rhythm: Normal rate and regular rhythm.     Pulses: Normal pulses.     Heart sounds: Normal heart sounds.  Pulmonary:     Effort: Pulmonary effort is normal.     Breath sounds: Examination of the right-upper field reveals wheezing. Examination of the left-upper field reveals wheezing. Examination of the right-lower field reveals wheezing. Examination of the left-lower field reveals wheezing. Wheezing present.  Abdominal:     General: Abdomen is flat.     Palpations: Abdomen is soft.  Musculoskeletal:        General: Normal range of motion.     Cervical back: Normal range of motion.  Skin:    General: Skin is warm.  Neurological:     General: No focal deficit present.     Mental Status: She is alert and oriented to person, place, and time. Mental status is at baseline.  Psychiatric:        Mood and Affect: Mood normal.        Behavior: Behavior normal.        Thought Content: Thought content normal.        Judgment: Judgment normal.   Assessment/Plan: 1. Mixed hyperlipidemia Start low dose statin and 81 mg ASA Consider further vascular studies - rosuvastatin (CRESTOR) 5 MG tablet; Take 1 tablet (5 mg total) by mouth daily.  Dispense: 90 tablet; Refill: 3  2. Chronic obstructive pulmonary disease, unspecified COPD type ([place]) Update PFT Treat recurrent exacerbation with levaquin and prednisone taper - Pulmonary function test; Future - predniSONE (DELTASONE) 10 MG tablet; Take 1 tablet three times a day with a meal for three for three days, take 1 tablet by twice daily with a meal for 3 days, take 1 tablet once daily with a meal for 3 days  Dispense: 18 tablet; Refill: 0 - levofloxacin (LEVAQUIN) 500 MG tablet; Take 1 tablet (500 mg total) by mouth daily.  Dispense: 7 tablet; Refill: 0  3. Chronic midline low back pain with bilateral sciatica Add Lyrica, continue with celebrex--unable to tolerate gabapentin in the  past - pregabalin (LYRICA) 25 MG capsule; Take 1  capsule (25 mg total) by mouth 2 (two) times daily.  Dispense: 60 capsule; Refill: 0  4. GAD (generalized anxiety disorder) Add Buspar up to TID in conjunction with Zoloft to help control GAD with goal to wean alprazolam use - ALPRAZolam (XANAX) 0.25 MG tablet; Take 1 tablet (0.25 mg total) by mouth 2 (two) times daily as needed for anxiety.  Dispense: 60 tablet; Refill: 0 - busPIRone (BUSPAR) 5 MG tablet; Take 1 tablet (5 mg total) by mouth 3 (three) times daily.  Dispense: 90 tablet; Refill: 0  5. Other insomnia Add trazodone to help with insomnia--goal to wean alprazolam use [place] Controlled Substance Database was reviewed by me for overdose risk score (ORS) - ALPRAZolam (XANAX) 0.25 MG tablet; Take 1 tablet (0.25 mg total) by mouth 2 (two) times daily as needed for anxiety.  Dispense: 60 tablet; Refill: 0 - traZODone (DESYREL) 50 MG tablet; Take 0.5-1 tablets (25-50 mg total) by mouth at bedtime as needed for sleep.  Dispense: 30 tablet; Refill: 0  6. Class 2 severe obesity due to excess calories with serious comorbidity and body mass index (BMI) of 38.0 to 38.9 in adult (HCC) BMI 38 Obesity Counseling: Risk Assessment: An assessment of behavioral risk factors was made today and includes lack of exercise sedentary lifestyle, lack of portion control and poor dietary habits.  Risk Modification Advice: She was counseled on portion control guidelines. Restricting daily caloric intake to 1800. The detrimental long term effects of obesity on her health and ongoing poor compliance was also discussed with the patient.  7. High risk medication use Monitor for nocturnal hypoxia due to alprazolam and lyrica - Pulse oximetry, overnight; Future   8. Nicotine dependence with current use Smoking cessation counseling: 1. Pt acknowledges the risks of long term smoking, she will try to quite smoking. 2. Options for different medications including nicotine  products, chewing gum, patch etc, Wellbutrin and Chantix is discussed 3. Goal and date of compete cessation is discussed 4. Total time spent in smoking cessation is 15 min.   General Counseling: Margaret Blackburn verbalizes understanding of the findings of todays visit and agrees with plan of treatment. I have discussed any further diagnostic evaluation that may be needed or ordered today. We also reviewed her medications today. she has been encouraged to call the office with any questions or concerns that should arise related to todays visit.    Orders Placed This Encounter  Procedures  . Pulse oximetry, overnight  . Pulmonary function test    Meds ordered this encounter  Medications  . rosuvastatin (CRESTOR) 5 MG tablet    Sig: Take 1 tablet (5 mg total) by mouth daily.    Dispense:  90 tablet    Refill:  3  . pregabalin (LYRICA) 25 MG capsule    Sig: Take 1 capsule (25 mg total) by mouth 2 (two) times daily.    Dispense:  60 capsule    Refill:  0  . ALPRAZolam (XANAX) 0.25 MG tablet    Sig: Take 1 tablet (0.25 mg total) by mouth 2 (two) times daily as needed for anxiety.    Dispense:  60 tablet    Refill:  0  . traZODone (DESYREL) 50 MG tablet    Sig: Take 0.5-1 tablets (25-50 mg total) by mouth at bedtime as needed for sleep.    Dispense:  30 tablet    Refill:  0  . busPIRone (BUSPAR) 5 MG tablet    Sig: Take 1 tablet (5 mg total)  by mouth 3 (three) times daily.    Dispense:  90 tablet    Refill:  0  . predniSONE (DELTASONE) 10 MG tablet    Sig: Take 1 tablet three times a day with a meal for three for three days, take 1 tablet by twice daily with a meal for 3 days, take 1 tablet once daily with a meal for 3 days    Dispense:  18 tablet    Refill:  0  . levofloxacin (LEVAQUIN) 500 MG tablet    Sig: Take 1 tablet (500 mg total) by mouth daily.    Dispense:  7 tablet    Refill:  0    Time spent: 30 Minutes Time spent includes review of chart, medications, test results and  follow-up plan with the patient.  This patient was seen by Theodoro Grist AGNP-C in Collaboration with Dr Lavera Guise as a part of collaborative care agreement     Tanna Furry. Thaila Bottoms AGNP-C Internal medicine

## 2020-04-15 ENCOUNTER — Telehealth: Payer: Self-pay | Admitting: *Deleted

## 2020-04-15 NOTE — Telephone Encounter (Signed)
Notified patient of LDCT lung cancer screening program results with recommendation for 12 month follow up imaging. Also notified of incidental findings noted below and is encouraged to discuss further with PCP who will receive a copy of this note and/or the CT report. Patient verbalizes understanding.   IMPRESSION: 1. Lung-RADS 2, benign appearance or behavior. Continue annual screening with low-dose chest CT without contrast in 12 months. 2. 3.9 cm right adrenal adenoma. 3. Emphysema (ICD10-J43.9) and Aortic Atherosclerosis (ICD10-170.0)

## 2020-04-16 ENCOUNTER — Encounter: Payer: Self-pay | Admitting: Hospice and Palliative Medicine

## 2020-04-18 ENCOUNTER — Other Ambulatory Visit: Payer: Medicaid Other

## 2020-05-09 ENCOUNTER — Ambulatory Visit
Admission: RE | Admit: 2020-05-09 | Discharge: 2020-05-09 | Disposition: A | Discharge status: Home / Self Care | Payer: Medicaid Other | Source: Ambulatory Visit | Attending: Hospice and Palliative Medicine | Admitting: Hospice and Palliative Medicine

## 2020-05-09 ENCOUNTER — Other Ambulatory Visit: Payer: Self-pay

## 2020-05-09 ENCOUNTER — Ambulatory Visit
Admission: RE | Admit: 2020-05-09 | Discharge: 2020-05-09 | Disposition: A | Discharge status: Home / Self Care | Payer: Medicaid Other | Source: Ambulatory Visit | Attending: Oncology | Admitting: Oncology

## 2020-05-09 DIAGNOSIS — Z0001 Encounter for general adult medical examination with abnormal findings: Secondary | ICD-10-CM | POA: Diagnosis not present

## 2020-05-09 DIAGNOSIS — Z8639 Personal history of other endocrine, nutritional and metabolic disease: Secondary | ICD-10-CM | POA: Insufficient documentation

## 2020-05-09 DIAGNOSIS — Z0000 Encounter for general adult medical examination without abnormal findings: Secondary | ICD-10-CM | POA: Diagnosis present

## 2020-05-11 ENCOUNTER — Ambulatory Visit (INDEPENDENT_AMBULATORY_CARE_PROVIDER_SITE_OTHER): Payer: Medicaid Other | Admitting: Internal Medicine

## 2020-05-11 ENCOUNTER — Other Ambulatory Visit: Payer: Self-pay

## 2020-05-11 DIAGNOSIS — R0602 Shortness of breath: Secondary | ICD-10-CM | POA: Diagnosis not present

## 2020-05-11 DIAGNOSIS — J449 Chronic obstructive pulmonary disease, unspecified: Secondary | ICD-10-CM

## 2020-05-11 LAB — PULMONARY FUNCTION TEST

## 2020-05-13 ENCOUNTER — Other Ambulatory Visit: Payer: Self-pay

## 2020-05-13 ENCOUNTER — Ambulatory Visit: Payer: Medicaid Other | Admitting: Hospice and Palliative Medicine

## 2020-05-13 ENCOUNTER — Encounter: Payer: Self-pay | Admitting: Hospice and Palliative Medicine

## 2020-05-13 VITALS — BP 100/76 | HR 108 | Temp 98.4°F | Resp 16 | Ht 62.0 in | Wt 208.2 lb

## 2020-05-13 DIAGNOSIS — D3501 Benign neoplasm of right adrenal gland: Secondary | ICD-10-CM

## 2020-05-13 DIAGNOSIS — F17200 Nicotine dependence, unspecified, uncomplicated: Secondary | ICD-10-CM | POA: Diagnosis not present

## 2020-05-13 DIAGNOSIS — F411 Generalized anxiety disorder: Secondary | ICD-10-CM

## 2020-05-13 DIAGNOSIS — J449 Chronic obstructive pulmonary disease, unspecified: Secondary | ICD-10-CM | POA: Diagnosis not present

## 2020-05-13 DIAGNOSIS — I700 Atherosclerosis of aorta: Secondary | ICD-10-CM

## 2020-05-13 DIAGNOSIS — G4709 Other insomnia: Secondary | ICD-10-CM

## 2020-05-13 MED ORDER — IPRATROPIUM-ALBUTEROL 0.5-2.5 (3) MG/3ML IN SOLN: 3.0000 mL | Freq: Four times a day (QID) | RESPIRATORY_TRACT | 3 refills | Status: DC | PRN

## 2020-05-13 MED ORDER — AZELASTINE HCL 0.1 % NA SOLN: 1.0000 | Freq: Two times a day (BID) | NASAL | 12 refills | Status: DC

## 2020-05-13 MED ORDER — BUPROPION HCL ER (XL) 150 MG PO TB24: 150.0000 mg | ORAL_TABLET | Freq: Every day | ORAL | 1 refills | Status: DC

## 2020-05-13 MED ORDER — ALPRAZOLAM 0.25 MG PO TABS
0.2500 mg | ORAL_TABLET | Freq: Two times a day (BID) | ORAL | 0 refills | Status: DC | PRN
Start: 2020-05-13 — End: 2020-06-27

## 2020-05-13 MED ORDER — LEVOCETIRIZINE DIHYDROCHLORIDE 5 MG PO TABS: 5.0000 mg | ORAL_TABLET | Freq: Every evening | ORAL | 1 refills | Status: DC

## 2020-05-13 NOTE — Progress Notes (Signed)
[facility] [place], [place] [address]  Internal MEDICINE  Office Visit Note  Patient Name: Margaret Blackburn  [id number]  [id number]  Date of Service: 05/15/2020  Chief Complaint  Patient presents with  . Follow-up  . Depression  . Sleep Apnea  . COPD    HPI Patient is here for routine follow-up Has completed course of Levaquin, continues to experience shortness of breath and wheezing PFT results not yet available, completed last week Unfortunately she does continue to smoke about 1.5 ppd Interested in cessation options Low dose chest CT scan IMPRESSION: 1. Lung-RADS 2, benign appearance or behavior. Continue annual screening with low-dose chest CT without contrast in 12 months. 2. 3.9 cm right adrenal adenoma. 3. Emphysema (ICD10-J43.9) and Aortic Atherosclerosis (ICD10-170.0)  ONO results not yet available, will contact company to get copy of results  Started on Lyrica at last visit, has noticed some improvement in her pain, continue with gabapentin  Tolerating low dose statin started at last visit, denies negative side effects  Buspar added at last visit, continues to feel anxiety remains uncontrolled, feels this is related to her breathing, shortness of breath and chest tightness, xanax works well at alleviating anxiety related to shortness of breath    Current Medication: Outpatient Encounter Medications as of 05/13/2020  Medication Sig Note  . azelastine (ASTELIN) 0.1 % nasal spray Place 1 spray into both nostrils 2 (two) times daily. Use in each nostril as directed   . buPROPion (WELLBUTRIN XL) 150 MG 24 hr tablet Take 1 tablet (150 mg total) by mouth daily.   Marland Kitchen levocetirizine (XYZAL) 5 MG tablet Take 1 tablet (5 mg total) by mouth every evening.   . [DISCONTINUED] ipratropium-albuterol (DUONEB) 0.5-2.5 (3) MG/3ML SOLN Take 3 mLs by nebulization every 6 (six) hours as needed.   Marland Kitchen acetaminophen (TYLENOL) 500 MG tablet Take 500 mg by mouth  every 6 (six) hours as needed.   Marland Kitchen albuterol (VENTOLIN HFA) 108 (90 Base) MCG/ACT inhaler Inhale 2 puffs into the lungs every 6 (six) hours as needed for wheezing or shortness of breath.   . ALPRAZolam (XANAX) 0.25 MG tablet Take 1 tablet (0.25 mg total) by mouth 2 (two) times daily as needed for anxiety.   Marland Kitchen anastrozole (ARIMIDEX) 1 MG tablet TAKE 1 TABLET BY MOUTH ONCE DAILY   . aspirin 81 MG chewable tablet Chew 81 mg by mouth daily.   Marland Kitchen aspirin EC 81 MG tablet Take 81 mg by mouth daily. Swallow whole.   . busPIRone (BUSPAR) 5 MG tablet Take 1 tablet (5 mg total) by mouth 3 (three) times daily.   . Calcium Carb-Cholecalciferol (CALCIUM 600+D3 PO) Take 1 tablet by mouth in the morning and at bedtime. 08/25/2019: Pt does not know dose of vit d 3  . celecoxib (CELEBREX) 100 MG capsule Take 100 mg by mouth 2 (two) times daily.   Marland Kitchen ipratropium-albuterol (DUONEB) 0.5-2.5 (3) MG/3ML SOLN Take 3 mLs by nebulization every 6 (six) hours as needed.   . Melatonin 1 MG CAPS Take 1 mg by mouth at bedtime as needed.   . montelukast (SINGULAIR) 10 MG tablet Take 1 tablet (10 mg total) by mouth at bedtime.   . Multiple Vitamins-Minerals (ALIVE WOMENS ENERGY PO) Take 1 Dose by mouth daily.   . pregabalin (LYRICA) 25 MG capsule Take 1 capsule (25 mg total) by mouth 2 (two) times daily.   . rosuvastatin (CRESTOR) 5 MG tablet Take 1 tablet (5 mg total) by mouth  daily.   . sertraline (ZOLOFT) 100 MG tablet Take 1 tablet (100 mg total) by mouth daily.   . SYMBICORT 160-4.5 MCG/ACT inhaler INHALE 2 PUFFS INTO THE LUNGS 2 TIMES DAILY   . tiotropium (SPIRIVA HANDIHALER) 18 MCG inhalation capsule Place 1 capsule (18 mcg total) into inhaler and inhale daily.   Marland Kitchen tiZANidine (ZANAFLEX) 4 MG tablet Take 1 tablet (4 mg total) by mouth every 6 (six) hours as needed for muscle spasms.   . traZODone (DESYREL) 50 MG tablet Take 0.5-1 tablets (25-50 mg total) by mouth at bedtime as needed for sleep.   . [DISCONTINUED] ALPRAZolam  (XANAX) 0.25 MG tablet Take 1 tablet (0.25 mg total) by mouth 2 (two) times daily as needed for anxiety.   . [DISCONTINUED] levofloxacin (LEVAQUIN) 500 MG tablet Take 1 tablet (500 mg total) by mouth daily. (Patient not taking: Reported on 05/13/2020)   . [DISCONTINUED] predniSONE (DELTASONE) 10 MG tablet Take 1 tablet three times a day with a meal for three for three days, take 1 tablet by twice daily with a meal for 3 days, take 1 tablet once daily with a meal for 3 days    No facility-administered encounter medications on file as of 05/13/2020.    Surgical History: Past Surgical History:  Procedure Laterality Date  . ABDOMINAL HYSTERECTOMY    . BACK SURGERY    . BREAST BIOPSY Right 12/25/2017   affirm bx , x clip,TUBULAR  CARCINOMA  . 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 BREAST LUMPECTOMY WITH NEEDLE LOCALIZATION Right 03/03/2018   Procedure: BREAST LUMPECTOMY WITH NEEDLE LOCALIZATION;  Surgeon: Vickie Epley, MD;  Location: [facility] ORS;  Service: General;  Laterality: Right;  . COLONOSCOPY WITH PROPOFOL N/A 08/31/2019   Procedure: COLONOSCOPY WITH PROPOFOL;  Surgeon: Jonathon Bellows, MD;  [facility] ENDOSCOPY;  Service: Gastroenterology;  Laterality: N/A;  . SENTINEL NODE BIOPSY Right 03/21/2018   Procedure: RIGHT SENTINEL LYMPH  NODE BIOPSY;  Surgeon: Vickie Epley, MD;  Location: [facility] ORS;  Service: General;  Laterality: Right;    Medical History: Past Medical History:  Diagnosis Date  . Allergy   . Arthritis   . Breast cancer ([place]) 12/2017   right breast  . COPD (chronic obstructive pulmonary disease) ([place])   . Depression   . Personal history of radiation therapy   . Prediabetes   . Sleep apnea    supposed to get CPAP 03/25/19    Family History: Family History  Problem Relation Age of Onset  . Breast cancer Mother 109  . Lung cancer Mother   . Breast cancer Maternal Grandmother   . Thyroid cancer Father     Social History   Socioeconomic  History  . Marital status: Single    Spouse name: Not on file  . Number of children: 2  . Years of education: Not on file  . Highest education level: Not on file  Occupational History  . Not on file  Tobacco Use  . Smoking status: Current Every Day Smoker    Packs/day: 2.00    Years: 39.00    Pack years: 78.00    Types: Cigarettes  . Smokeless tobacco: Never Used  . Tobacco comment: in the process of trying to quit-chantix  Vaping Use  . Vaping Use: Former  . Devices: used  2 weeks per pt  Substance and Sexual Activity  . Alcohol use: No  . Drug use: Yes    Types: Marijuana    Comment:  occ  . Sexual activity: Yes  Other Topics Concern  . Not on file  Social History Narrative  . Not on file   Social Determinants of Health   Financial Resource Strain: Not on file  Food Insecurity: Not on file  Transportation Needs: Not on file  Physical Activity: Not on file  Stress: Not on file  Social Connections: Not on file  Intimate Partner Violence: Not on file      Review of Systems  Constitutional: Negative for chills, diaphoresis and fatigue.  HENT: Negative for ear pain, postnasal drip and sinus pressure.   Eyes: Negative for photophobia, discharge, redness, itching and visual disturbance.  Respiratory: Positive for chest tightness, shortness of breath and wheezing. Negative for cough.   Cardiovascular: Negative for chest pain, palpitations and leg swelling.  Gastrointestinal: Negative for abdominal pain, constipation, diarrhea, nausea and vomiting.  Genitourinary: Negative for dysuria and flank pain.  Musculoskeletal: Positive for arthralgias and back pain. Negative for gait problem and neck pain.  Skin: Negative for color change.  Allergic/Immunologic: Negative for environmental allergies and food allergies.  Neurological: Negative for dizziness and headaches.  Hematological: Does not bruise/bleed easily.  Psychiatric/Behavioral: Positive for sleep disturbance.  Negative for agitation, behavioral problems (depression) and hallucinations. The patient is nervous/anxious.     Vital Signs: BP 100/76   Pulse (!) 108   Temp 98.4 F (36.9 C)   Resp 16   Ht 5\' 2"  (1.575 m)   Wt 208 lb 3.2 oz (94.4 kg)   SpO2 97%   BMI 38.08 kg/m    Physical Exam Vitals reviewed.  Constitutional:      Appearance: Normal appearance. She is obese.  Cardiovascular:     Rate and Rhythm: Normal rate and regular rhythm.     Pulses: Normal pulses.     Heart sounds: Normal heart sounds.  Pulmonary:     Effort: Pulmonary effort is normal.     Breath sounds: Examination of the right-upper field reveals wheezing. Examination of the left-upper field reveals wheezing. Examination of the right-middle field reveals wheezing. Examination of the left-middle field reveals wheezing. Examination of the right-lower field reveals wheezing. Examination of the left-lower field reveals wheezing. Wheezing present.  Abdominal:     General: Abdomen is flat.     Palpations: Abdomen is soft.  Musculoskeletal:        General: Normal range of motion.     Cervical back: Normal range of motion.  Skin:    General: Skin is warm.  Neurological:     General: No focal deficit present.     Mental Status: She is alert and oriented to person, place, and time. Mental status is at baseline.  Psychiatric:        Mood and Affect: Mood normal.        Behavior: Behavior normal.        Thought Content: Thought content normal.        Judgment: Judgment normal.    Assessment/Plan: 1. Aortic atherosclerosis ([place]) Continue with statin therapy, tolerating well  2. Adenoma of right adrenal gland Labs stable, will monitor annually with CT abdomen  3. GAD (generalized anxiety disorder) Add Wellbutrin, continue with Zoloft and Buspar as needed Alprazolam as needed--continue to work on weaning use [place] Controlled Substance Database was reviewed by me for overdose risk score (ORS) - buPROPion (WELLBUTRIN  XL) 150 MG 24 hr tablet; Take 1 tablet (150 mg total) by mouth daily.  Dispense: 90 tablet; Refill: 1 - ALPRAZolam (  XANAX) 0.25 MG tablet; Take 1 tablet (0.25 mg total) by mouth 2 (two) times daily as needed for anxiety.  Dispense: 60 tablet; Refill: 0  4. Chronic obstructive pulmonary disease, unspecified COPD type ([place]) Add allergy therapy as symptoms likely contributing to exacerbated wheezing Set up for nebulizer, await PFT results, may benefit from triple therapy such as Trelegy - azelastine (ASTELIN) 0.1 % nasal spray; Place 1 spray into both nostrils 2 (two) times daily. Use in each nostril as directed  Dispense: 30 mL; Refill: 12 - levocetirizine (XYZAL) 5 MG tablet; Take 1 tablet (5 mg total) by mouth every evening.  Dispense: 90 tablet; Refill: 1 - ipratropium-albuterol (DUONEB) 0.5-2.5 (3) MG/3ML SOLN; Take 3 mLs by nebulization every 6 (six) hours as needed.  Dispense: 360 mL; Refill: 3 - For home use only DME Nebulizer machine  5. Nicotine dependence with current use Add Wellbutrin for cessation assistance Smoking cessation counseling: 1. Pt acknowledges the risks of long term smoking, she will try to quite smoking. 2. Options for different medications including nicotine products, chewing gum, patch etc, Wellbutrin and Chantix is discussed 3. Goal and date of compete cessation is discussed 4. Total time spent in smoking cessation is 15 min. - buPROPion (WELLBUTRIN XL) 150 MG 24 hr tablet; Take 1 tablet (150 mg total) by mouth daily.  Dispense: 90 tablet; Refill: 1  General Counseling: Carmalita verbalizes understanding of the findings of todays visit and agrees with plan of treatment. I have discussed any further diagnostic evaluation that may be needed or ordered today. We also reviewed her medications today. she has been encouraged to call the office with any questions or concerns that should arise related to todays visit.    Orders Placed This Encounter  Procedures  . For home  use only DME Nebulizer machine    Meds ordered this encounter  Medications  . azelastine (ASTELIN) 0.1 % nasal spray    Sig: Place 1 spray into both nostrils 2 (two) times daily. Use in each nostril as directed    Dispense:  30 mL    Refill:  12  . levocetirizine (XYZAL) 5 MG tablet    Sig: Take 1 tablet (5 mg total) by mouth every evening.    Dispense:  90 tablet    Refill:  1  . buPROPion (WELLBUTRIN XL) 150 MG 24 hr tablet    Sig: Take 1 tablet (150 mg total) by mouth daily.    Dispense:  90 tablet    Refill:  1  . ALPRAZolam (XANAX) 0.25 MG tablet    Sig: Take 1 tablet (0.25 mg total) by mouth 2 (two) times daily as needed for anxiety.    Dispense:  60 tablet    Refill:  0  . ipratropium-albuterol (DUONEB) 0.5-2.5 (3) MG/3ML SOLN    Sig: Take 3 mLs by nebulization every 6 (six) hours as needed.    Dispense:  360 mL    Refill:  3    Time spent: 30 Minutes   This patient was seen by Theodoro Grist AGNP-C in Collaboration with Dr Lavera Guise as a part of collaborative care agreement     Tanna Furry. Renatta Shrieves AGNP-C Internal medicine

## 2020-05-15 ENCOUNTER — Encounter: Payer: Self-pay | Admitting: Hospice and Palliative Medicine

## 2020-05-16 ENCOUNTER — Telehealth: Payer: Self-pay

## 2020-05-16 ENCOUNTER — Other Ambulatory Visit: Payer: Self-pay | Admitting: Hospice and Palliative Medicine

## 2020-05-16 DIAGNOSIS — G4709 Other insomnia: Secondary | ICD-10-CM

## 2020-05-16 DIAGNOSIS — M5442 Lumbago with sciatica, left side: Secondary | ICD-10-CM

## 2020-05-16 DIAGNOSIS — G8929 Other chronic pain: Secondary | ICD-10-CM

## 2020-05-16 NOTE — Telephone Encounter (Signed)
lmom  To call us back for pulse ox result

## 2020-05-17 ENCOUNTER — Telehealth: Payer: Self-pay

## 2020-05-17 NOTE — Telephone Encounter (Signed)
Last appt 05/13/20

## 2020-05-17 NOTE — Telephone Encounter (Signed)
Pt advised about pulse ox result did showed that she need oxygen at night and we already send order to [facility] for oxygen and discuss in detailed at next visit

## 2020-05-17 NOTE — Telephone Encounter (Signed)
Please review and send to pharmacy

## 2020-05-19 ENCOUNTER — Other Ambulatory Visit: Payer: Self-pay | Admitting: Hospice and Palliative Medicine

## 2020-05-19 DIAGNOSIS — F411 Generalized anxiety disorder: Secondary | ICD-10-CM

## 2020-05-25 ENCOUNTER — Ambulatory Visit: Payer: Medicaid Other

## 2020-05-25 NOTE — Procedures (Signed)
[facility] [address] [place], [address]    Complete Pulmonary Function Testing Interpretation:  FINDINGS:  Forced vital capacity is mildly decreased.  FEV1 is 1.73 L which is 69% of predicted and is mildly decreased.  FEV1 FVC ratio is mildly decreased.  Total lung capacity is normal residual volume is normal residual internal capacity ratio is increased FRC is mildly decreased.  Postbronchodilator there is no significant change in FEV1.  DLCO was mildly decreased.  IMPRESSION:  This pulmonary function study is consistent with mild obstructive lung disease.  There is also reduction in the [place], MD [facility] Pulmonary Critical Care Medicine Sleep Medicine

## 2020-06-02 ENCOUNTER — Encounter: Payer: Self-pay | Admitting: Physician Assistant

## 2020-06-02 ENCOUNTER — Ambulatory Visit: Payer: Medicaid Other | Admitting: Physician Assistant

## 2020-06-02 ENCOUNTER — Other Ambulatory Visit: Payer: Self-pay

## 2020-06-02 DIAGNOSIS — G4734 Idiopathic sleep related nonobstructive alveolar hypoventilation: Secondary | ICD-10-CM | POA: Diagnosis not present

## 2020-06-02 DIAGNOSIS — G4733 Obstructive sleep apnea (adult) (pediatric): Secondary | ICD-10-CM | POA: Diagnosis not present

## 2020-06-02 DIAGNOSIS — J449 Chronic obstructive pulmonary disease, unspecified: Secondary | ICD-10-CM | POA: Diagnosis not present

## 2020-06-02 DIAGNOSIS — F17200 Nicotine dependence, unspecified, uncomplicated: Secondary | ICD-10-CM

## 2020-06-02 DIAGNOSIS — F411 Generalized anxiety disorder: Secondary | ICD-10-CM

## 2020-06-02 NOTE — Progress Notes (Signed)
[facility] [place], [place] [address]  Pulmonary Sleep Medicine   Office Visit Note  Patient Name: Margaret Blackburn DOB: September 29, 1965 MRN [id number]  Date of Service: 06/05/2020  Complaints/HPI: Patient is here for routine pulmonary follow-up. Tried CPAP for a few months with full face mask and did not like it, so stopped using it. Open to restarting CPAP with a nasal mask instead.  Patient aware that she may have to retest in order to restart on CPAP therapy.  Patient is willing to try again after discussion regarding consequences of untreated sleep apnea especially given recent overnight pulse oximetry readings.  Patient is currently using oxygen nightly.  She uses her nebulizer bid, symbicort bid and spiriva once per day. She also uses a nasal spray and singulair.  Recent PFT showed FEV1 of 1.73 L, 69%-mild obstruction.  This is slightly reduced from PFT previously with FEV1 of 1.87 L, 74% predicted.  Patient is still smoking a pack per day, but is working on quitting and recently started Wellbutrin.  ROS  General: (-) fever, (-) chills, (-) night sweats, (-) weakness Skin: (-) rashes, (-) itching,. Eyes: (-) visual changes, (-) redness, (-) itching. Nose and Sinuses: (-) nasal stuffiness or itchiness, (-) postnasal drip, (-) nosebleeds, (-) sinus trouble. Mouth and Throat: (-) sore throat, (-) hoarseness. Neck: (-) swollen glands, (-) enlarged thyroid, (-) neck pain. Respiratory: - cough, (-) bloody sputum, - shortness of breath, + wheezing. Cardiovascular: - ankle swelling, (-) chest pain. Lymphatic: (-) lymph node enlargement. Neurologic: (-) numbness, (-) tingling. Psychiatric: (-) anxiety, (-) depression   Current Medication: Outpatient Encounter Medications as of 06/02/2020  Medication Sig Note  . acetaminophen (TYLENOL) 500 MG tablet Take 500 mg by mouth every 6 (six) hours as needed.   Marland Kitchen albuterol (VENTOLIN HFA) 108 (90 Base) MCG/ACT inhaler Inhale  2 puffs into the lungs every 6 (six) hours as needed for wheezing or shortness of breath.   . ALPRAZolam (XANAX) 0.25 MG tablet Take 1 tablet (0.25 mg total) by mouth 2 (two) times daily as needed for anxiety.   Marland Kitchen anastrozole (ARIMIDEX) 1 MG tablet TAKE 1 TABLET BY MOUTH ONCE DAILY   . aspirin 81 MG chewable tablet Chew 81 mg by mouth daily.   Marland Kitchen aspirin EC 81 MG tablet Take 81 mg by mouth daily. Swallow whole.   Marland Kitchen azelastine (ASTELIN) 0.1 % nasal spray Place 1 spray into both nostrils 2 (two) times daily. Use in each nostril as directed   . buPROPion (WELLBUTRIN XL) 150 MG 24 hr tablet Take 1 tablet (150 mg total) by mouth daily.   . busPIRone (BUSPAR) 5 MG tablet TAKE 1 TABLET 3 TIMES A DAY   . Calcium Carb-Cholecalciferol (CALCIUM 600+D3 PO) Take 1 tablet by mouth in the morning and at bedtime. 08/25/2019: Pt does not know dose of vit d 3  . celecoxib (CELEBREX) 100 MG capsule Take 100 mg by mouth 2 (two) times daily.   Marland Kitchen ipratropium-albuterol (DUONEB) 0.5-2.5 (3) MG/3ML SOLN Take 3 mLs by nebulization every 6 (six) hours as needed.   Marland Kitchen levocetirizine (XYZAL) 5 MG tablet Take 1 tablet (5 mg total) by mouth every evening.   . Melatonin 1 MG CAPS Take 1 mg by mouth at bedtime as needed.   . montelukast (SINGULAIR) 10 MG tablet Take 1 tablet (10 mg total) by mouth at bedtime.   . Multiple Vitamins-Minerals (ALIVE WOMENS ENERGY PO) Take 1 Dose by mouth daily.   Marland Kitchen  pregabalin (LYRICA) 25 MG capsule TAKE 1 CAPSULE BY MOUTH TWICE DAILY   . rosuvastatin (CRESTOR) 5 MG tablet Take 1 tablet (5 mg total) by mouth daily.   . sertraline (ZOLOFT) 100 MG tablet Take 1 tablet (100 mg total) by mouth daily.   . SYMBICORT 160-4.5 MCG/ACT inhaler INHALE 2 PUFFS INTO THE LUNGS 2 TIMES DAILY   . tiotropium (SPIRIVA HANDIHALER) 18 MCG inhalation capsule Place 1 capsule (18 mcg total) into inhaler and inhale daily.   Marland Kitchen tiZANidine (ZANAFLEX) 4 MG tablet Take 1 tablet (4 mg total) by mouth every 6 (six) hours as needed  for muscle spasms.   . traZODone (DESYREL) 50 MG tablet TAKE 1/2 TO 1 TABLET (25-50MG TOTAL) BY MOUTH AT BEDTIME AS NEEDED FOR SLEEP    No facility-administered encounter medications on file as of 06/02/2020.    Surgical History: Past Surgical History:  Procedure Laterality Date  . ABDOMINAL HYSTERECTOMY    . BACK SURGERY    . BREAST BIOPSY Right 12/25/2017   affirm bx , x clip,TUBULAR  CARCINOMA  . 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 BREAST LUMPECTOMY WITH NEEDLE LOCALIZATION Right 03/03/2018   Procedure: BREAST LUMPECTOMY WITH NEEDLE LOCALIZATION;  Surgeon: Vickie Epley, MD;  Location: [facility] ORS;  Service: General;  Laterality: Right;  . COLONOSCOPY WITH PROPOFOL N/A 08/31/2019   Procedure: COLONOSCOPY WITH PROPOFOL;  Surgeon: Jonathon Bellows, MD;  Location: [facility] ENDOSCOPY;  Service: Gastroenterology;  Laterality: N/A;  . SENTINEL NODE BIOPSY Right 03/21/2018   Procedure: RIGHT SENTINEL LYMPH  NODE BIOPSY;  Surgeon: Vickie Epley, MD;  Location: [facility] ORS;  Service: General;  Laterality: Right;    Medical History: Past Medical History:  Diagnosis Date  . Allergy   . Arthritis   . Breast cancer ([place]) 12/2017   right breast  . COPD (chronic obstructive pulmonary disease) ([place])   . Depression   . Personal history of radiation therapy   . Prediabetes   . Sleep apnea    supposed to get CPAP 03/25/19    Family History: Family History  Problem Relation Age of Onset  . Breast cancer Mother 38  . Lung cancer Mother   . Breast cancer Maternal Grandmother   . Thyroid cancer Father     Social History: Social History   Socioeconomic History  . Marital status: Single    Spouse name: Not on file  . Number of children: 2  . Years of education: Not on file  . Highest education level: Not on file  Occupational History  . Not on file  Tobacco Use  . Smoking status: Current Every Day Smoker    Packs/day: 2.00    Years: 39.00    Pack years: 78.00     Types: Cigarettes  . Smokeless tobacco: Never Used  . Tobacco comment: in the process of trying to quit-chantix  Vaping Use  . Vaping Use: Former  . Devices: used  2 weeks per pt  Substance and Sexual Activity  . Alcohol use: No  . Drug use: Yes    Types: Marijuana    Comment: occ  . Sexual activity: Yes  Other Topics Concern  . Not on file  Social History Narrative  . Not on file   Social Determinants of Health   Financial Resource Strain: Not on file  Food Insecurity: Not on file  Transportation Needs: Not on file  Physical Activity: Not on file  Stress: Not on file  Social Connections: Not  on file  Intimate Partner Violence: Not on file    Vital Signs: Blood pressure 100/80, pulse (!) 115, temperature (!) 97.5 F (36.4 C), resp. rate 16, height '5\' 2"'  (1.575 m), weight 208 lb 6.4 oz (94.5 kg), SpO2 98 %.  Examination: General Appearance: The patient is well-developed, well-nourished, and in no distress. Skin: Gross inspection of skin unremarkable. Head: normocephalic, no gross deformities. Eyes: no gross deformities noted. ENT: ears appear grossly normal no exudates. Neck: Supple. No thyromegaly. No LAD. Respiratory: Some wheezing present on exam, no rhonchi or rales.  No increased work of breathing. Cardiovascular: Normal S1 and S2 without murmur or rub. Extremities: No cyanosis. pulses are equal. Neurologic: Alert and oriented. No involuntary movements.  LABS: Recent Results (from the past 2160 hour(s))  UA/M w/rflx Culture, Routine     Status: Abnormal   Collection Time: 03/14/20  8:37 AM   Specimen: Urine   Urine  Result Value Ref Range   Specific Gravity, UA 1.022 1.005 - 1.030   pH, UA 5.5 5.0 - 7.5   Color, UA Yellow Yellow   Appearance Ur Turbid (A) Clear   Leukocytes,UA Negative Negative   Protein,UA Negative Negative/Trace   Glucose, UA Negative Negative   Ketones, UA Negative Negative   RBC, UA Negative Negative   Bilirubin, UA Negative  Negative   Urobilinogen, Ur 0.2 0.2 - 1.0 mg/dL   Nitrite, UA Negative Negative   Microscopic Examination Comment     Comment: Microscopic follows if indicated.   Microscopic Examination See below:     Comment: Microscopic was indicated and was performed.   Urinalysis Reflex Comment     Comment: This specimen has reflexed to a Urine Culture.  Microscopic Examination     Status: Abnormal   Collection Time: 03/14/20  8:37 AM   Urine  Result Value Ref Range   WBC, UA 0-5 0 - 5 /hpf   RBC None seen 0 - 2 /hpf   Epithelial Cells (non renal) >10 (A) 0 - 10 /hpf   Casts Present (A) None seen /lpf   Cast Type Epithelial cell casts (A) N/A   Bacteria, UA Moderate (A) None seen/Few  Urine Culture, Reflex     Status: Abnormal   Collection Time: 03/14/20  8:37 AM   Urine  Result Value Ref Range   Urine Culture, Routine Final report (A)    Organism ID, Bacteria Serratia marcescens (A)     Comment: 25,000-50,000 colony forming units per mL   Antimicrobial Susceptibility Comment     Comment:       ** S = Susceptible; I = Intermediate; R = Resistant **                    P = Positive; N = Negative             MICS are expressed in micrograms per mL    Antibiotic                 RSLT#1    RSLT#2    RSLT#3    RSLT#4 Amoxicillin/Clavulanic Acid    R Cefazolin                      R Cefepime                       S Ceftriaxone  S Cefuroxime                     R Ciprofloxacin                  S Ertapenem                      S Gentamicin                     S Levofloxacin                   S Meropenem                      S Nitrofurantoin                 R Tetracycline                   R Tobramycin                     I Trimethoprim/Sulfa             S   CBC w/Diff/Platelet     Status: None   Collection Time: 03/22/20  1:12 PM  Result Value Ref Range   WBC 8.6 3.4 - 10.8 x10E3/uL   RBC 4.77 3.77 - 5.28 x10E6/uL   Hemoglobin 14.2 11.1 - 15.9 g/dL   Hematocrit 43.4  34.0 - 46.6 %   MCV 91 79 - 97 fL   MCH 29.8 26.6 - 33.0 pg   MCHC 32.7 31.5 - 35.7 g/dL   RDW 13.1 11.7 - 15.4 %   Platelets 309 150 - 450 x10E3/uL   Neutrophils 66 Not Estab. %   Lymphs 22 Not Estab. %   Monocytes 7 Not Estab. %   Eos 3 Not Estab. %   Basos 1 Not Estab. %   Neutrophils Absolute 5.8 1.4 - 7.0 x10E3/uL   Lymphocytes Absolute 1.9 0.7 - 3.1 x10E3/uL   Monocytes Absolute 0.6 0.1 - 0.9 x10E3/uL   EOS (ABSOLUTE) 0.3 0.0 - 0.4 x10E3/uL   Basophils Absolute 0.1 0.0 - 0.2 x10E3/uL   Immature Granulocytes 1 Not Estab. %   Immature Grans (Abs) 0.0 0.0 - 0.1 x10E3/uL  Comprehensive Metabolic Panel (CMET)     Status: None   Collection Time: 03/22/20  1:12 PM  Result Value Ref Range   Glucose 86 65 - 99 mg/dL   BUN 11 6 - 24 mg/dL   Creatinine, Ser 0.88 0.57 - 1.00 mg/dL   GFR calc non Af Amer 75 >59 mL/min/1.73   GFR calc Af Amer 87 >59 mL/min/1.73    Comment: **In accordance with recommendations from the NKF-ASN Task force,**   [facility] is in the process of updating its eGFR calculation to the   2021 CKD-EPI creatinine equation that estimates kidney function   without a race variable.    BUN/Creatinine Ratio 13 9 - 23   Sodium 138 134 - 144 mmol/L   Potassium 5.1 3.5 - 5.2 mmol/L   Chloride 100 96 - 106 mmol/L   CO2 23 20 - 29 mmol/L   Calcium 9.6 8.7 - 10.2 mg/dL   Total Protein 7.2 6.0 - 8.5 g/dL   Albumin 4.5 3.8 - 4.9 g/dL   Globulin, Total 2.7 1.5 - 4.5 g/dL   Albumin/Globulin Ratio 1.7 1.2 - 2.2   Bilirubin Total 0.3 0.0 - 1.2  mg/dL   Alkaline Phosphatase 87 44 - 121 IU/L   AST 16 0 - 40 IU/L   ALT 23 0 - 32 IU/L  Lipid Panel With LDL/HDL Ratio     Status: Abnormal   Collection Time: 03/22/20  1:12 PM  Result Value Ref Range   Cholesterol, Total 210 (H) 100 - 199 mg/dL   Triglycerides 251 (H) 0 - 149 mg/dL   HDL 54 >39 mg/dL   VLDL Cholesterol Cal 43 (H) 5 - 40 mg/dL   LDL Chol Calc (NIH) 113 (H) 0 - 99 mg/dL   LDL/HDL Ratio 2.1 0.0 - 3.2 ratio     Comment:                                     LDL/HDL Ratio                                             Men  Women                               1/2 Avg.Risk  1.0    1.5                                   Avg.Risk  3.6    3.2                                2X Avg.Risk  6.2    5.0                                3X Avg.Risk  8.0    6.1   TSH + free T4     Status: None   Collection Time: 03/22/20  1:12 PM  Result Value Ref Range   TSH 0.934 0.450 - 4.500 uIU/mL   Free T4 1.10 0.82 - 1.77 ng/dL    Radiology: DG Bone Density  Result Date: 05/09/2020 EXAM: DUAL X-RAY ABSORPTIOMETRY (DXA) FOR BONE MINERAL DENSITY IMPRESSION: Dear Dr Kenton Kingfisher, Your patient Karletta Millay completed a BMD test on 05/09/2020 using the [employer] (software version: 14.10) manufactured by [employer]. The following summarizes the results of our evaluation. Technologist: MTB PATIENT BIOGRAPHICAL: Name: Deliyah, Muckle Patient ID: [id number] Birth Date: [birth year] Height: 62.0 in. Gender: Female Exam Date: 05/09/2020 Weight: 207.0 lbs. Indications: Tobacco User, Postmenopausal, Caucasian, History of Breast Cancer, Hysterectomy, History of Radiation, History of Spinal Surgery, Tobacco User (Current Smoker) Fractures: Treatments: Anastrozole, Calcium, Vitamin D DENSITOMETRY RESULTS: Site         Region     Measured Date Measured Age WHO Classification Young Adult T-score BMD         %Change vs. Previous Significant Change (*) DualFemur Neck Left 05/09/2020 53.4 Osteopenia -1.5 0.830 g/cm2 -0.6% - DualFemur Neck Left 08/01/2018 51.6 Osteopenia -1.5 0.835 g/cm2 - - DualFemur Total Mean 05/09/2020 53.4 Normal -0.8 0.901 g/cm2 -3.0% Yes DualFemur Total Mean 08/01/2018 51.6 Normal -0.6 0.929 g/cm2 - - Left Forearm Radius 33% 05/09/2020 53.4 Normal 0.0 0.877 g/cm2  1.4% - Left Forearm Radius 33% 08/01/2018 51.6 Normal -0.1 0.865 g/cm2 - - ASSESSMENT: The BMD measured at Femur Neck Left is 0.830 g/cm2 with a T-score of  -1.5. This patient is considered OSTEOPENIC according to [employer]) criteria. The scan quality is good. Lumbar spine was not utilized due to surgical hardware. Compared with prior study, there has been significant decrease in the total hip. World Pharmacologist [facility]) criteria for post-menopausal, Caucasian Women: Normal:                   T-score at or above -1 SD Osteopenia/low bone mass: T-score between -1 and -2.5 SD Osteoporosis:             T-score at or below -2.5 SD RECOMMENDATIONS: 1. All patients should optimize calcium and vitamin D intake. 2. Consider FDA-approved medical therapies in postmenopausal women and men aged 24 years and older, based on the following: a. A hip or vertebral(clinical or morphometric) fracture b. T-score < -2.5 at the femoral neck or spine after appropriate evaluation to exclude secondary causes c. Low bone mass (T-score between -1.0 and -2.5 at the femoral neck or spine) and a 10-year probability of a hip fracture > 3% or a 10-year probability of a major osteoporosis-related fracture > 20% based on the [place]-adapted WHO algorithm 3. Clinician judgment and/or patient preferences may indicate treatment for people with 10-year fracture probabilities above or below these levels FOLLOW-UP: People with diagnosed cases of osteoporosis or at high risk for fracture should have regular bone mineral density tests. For patients eligible for Medicare, routine testing is allowed once every 2 years. The testing frequency can be increased to one year for patients who have rapidly progressing disease, those who are receiving or discontinuing medical therapy to restore bone mass, or have additional risk factors. I have reviewed this report, and agree with the above findings. Mark A. Thornton Papas, M.D. [facility] Your patient AILED DEFIBAUGH completed a FRAX assessment on 05/09/2020 using the [employer] (analysis version: 14.10) manufactured by [employer]. The following summarizes the results of our evaluation. PATIENT BIOGRAPHICAL: Name: Emmery, Seiler Patient ID: [id number] Birth Date: [birth year] Height:    62.0 in. Gender:     Female    Age:        54.4       Weight:    207.0 lbs. Ethnicity:  White                            Exam Date: 05/09/2020 FRAX* RESULTS:  (version: 3.5) 10-year Probability of Fracture1 Major Osteoporotic Fracture2 Hip Fracture 5.5% 0.7% Population: [place] (Caucasian) Risk Factors: Tobacco User (Current Smoker) Based on DualFemur (Left) Neck BMD 1 -The 10-year probability of fracture may be lower than reported if the patient has received treatment. 2 -Major Osteoporotic Fracture: Clinical Spine, Forearm, Hip or Shoulder *FRAX is a Materials engineer of the [employer] for Metabolic Bone Disease, a [place] (WHO) [facility]. ASSESSMENT: The probability of a major osteoporotic fracture is 5.5 within the next ten years. The probability of a hip fracture is 0.7 within the next ten years. I have reviewed this report and agree with the above findings. Mark A. Thornton Papas, M.D. [facility] Electronically Signed   By: Lavonia Dana M.D.   On: 05/09/2020 16:51   MM DIAG BREAST TOMO BILATERAL  Result Date: 05/09/2020 CLINICAL DATA:  History of right breast cancer status post lumpectomy in 2020. EXAM: DIGITAL DIAGNOSTIC BILATERAL MAMMOGRAM WITH TOMOSYNTHESIS AND CAD TECHNIQUE: Bilateral digital diagnostic mammography and breast tomosynthesis was performed. The images were evaluated with computer-aided detection. COMPARISON:  Previous exam(s). ACR Breast Density Category b: There are scattered areas of fibroglandular density. FINDINGS: Stable lumpectomy changes are seen in the right breast. No suspicious mass or malignant type microcalcifications identified in either breast. IMPRESSION: No evidence of malignancy in either breast. RECOMMENDATION: Bilateral diagnostic mammogram in 1 year  is recommended. I have discussed the findings and recommendations with the patient. If applicable, a reminder letter will be sent to the patient regarding the next appointment. BI-RADS CATEGORY  2: Benign. Electronically Signed   By: Lillia Mountain M.D.   On: 05/09/2020 13:44    No results found.  DG Bone Density  Result Date: 05/09/2020 EXAM: DUAL X-RAY ABSORPTIOMETRY (DXA) FOR BONE MINERAL DENSITY IMPRESSION: Dear Dr Kenton Kingfisher, Your patient Raeya Merritts completed a BMD test on 05/09/2020 using the Green (software version: 14.10) manufactured by [employer]. The following summarizes the results of our evaluation. Technologist: MTB PATIENT BIOGRAPHICAL: Name: Kailany, Dinunzio Patient ID: [id number] Birth Date: 04/30/1965 Height: 62.0 in. Gender: Female Exam Date: 05/09/2020 Weight: 207.0 lbs. Indications: Tobacco User, Postmenopausal, Caucasian, History of Breast Cancer, Hysterectomy, History of Radiation, History of Spinal Surgery, Tobacco User (Current Smoker) Fractures: Treatments: Anastrozole, Calcium, Vitamin D DENSITOMETRY RESULTS: Site         Region     Measured Date Measured Age WHO Classification Young Adult T-score BMD         %Change vs. Previous Significant Change (*) DualFemur Neck Left 05/09/2020 53.4 Osteopenia -1.5 0.830 g/cm2 -0.6% - DualFemur Neck Left 08/01/2018 51.6 Osteopenia -1.5 0.835 g/cm2 - - DualFemur Total Mean 05/09/2020 53.4 Normal -0.8 0.901 g/cm2 -3.0% Yes DualFemur Total Mean 08/01/2018 51.6 Normal -0.6 0.929 g/cm2 - - Left Forearm Radius 33% 05/09/2020 53.4 Normal 0.0 0.877 g/cm2 1.4% - Left Forearm Radius 33% 08/01/2018 51.6 Normal -0.1 0.865 g/cm2 - - ASSESSMENT: The BMD measured at Femur Neck Left is 0.830 g/cm2 with a T-score of -1.5. This patient is considered OSTEOPENIC according to [facility]) criteria. The scan quality is good. Lumbar spine was not utilized due to surgical hardware. Compared with prior study, there has been  significant decrease in the total hip. World Pharmacologist [facility]) criteria for post-menopausal, Caucasian Women: Normal:                   T-score at or above -1 SD Osteopenia/low bone mass: T-score between -1 and -2.5 SD Osteoporosis:             T-score at or below -2.5 SD RECOMMENDATIONS: 1. All patients should optimize calcium and vitamin D intake. 2. Consider FDA-approved medical therapies in postmenopausal women and men aged 44 years and older, based on the following: a. A hip or vertebral(clinical or morphometric) fracture b. T-score < -2.5 at the femoral neck or spine after appropriate evaluation to exclude secondary causes c. Low bone mass (T-score between -1.0 and -2.5 at the femoral neck or spine) and a 10-year probability of a hip fracture > 3% or a 10-year probability of a major osteoporosis-related fracture > 20% based on the [place]-adapted WHO algorithm 3. Clinician judgment and/or patient preferences may indicate treatment for people with 10-year fracture probabilities above or below these levels FOLLOW-UP: People with diagnosed cases of osteoporosis or at high  risk for fracture should have regular bone mineral density tests. For patients eligible for Medicare, routine testing is allowed once every 2 years. The testing frequency can be increased to one year for patients who have rapidly progressing disease, those who are receiving or discontinuing medical therapy to restore bone mass, or have additional risk factors. I have reviewed this report, and agree with the above findings. Mark A. Thornton Papas, M.D. [facility] Your patient SAYLER MICKIEWICZ completed a FRAX assessment on 05/09/2020 using the [place] (analysis version: 14.10) manufactured by [employer]. The following summarizes the results of our evaluation. PATIENT BIOGRAPHICAL: Name: Alencia, Gordon Patient ID: [id number] Birth Date: 11/17/1965 Height:    62.0 in. Gender:     Female    Age:        54.4       Weight:     207.0 lbs. Ethnicity:  White                            Exam Date: 05/09/2020 FRAX* RESULTS:  (version: 3.5) 10-year Probability of Fracture1 Major Osteoporotic Fracture2 Hip Fracture 5.5% 0.7% Population: [place] (Caucasian) Risk Factors: Tobacco User (Current Smoker) Based on DualFemur (Left) Neck BMD 1 -The 10-year probability of fracture may be lower than reported if the patient has received treatment. 2 -Major Osteoporotic Fracture: Clinical Spine, Forearm, Hip or Shoulder *FRAX is a Materials engineer of the [employer] for Metabolic Bone Disease, a [place] (WHO) [facility]. ASSESSMENT: The probability of a major osteoporotic fracture is 5.5 within the next ten years. The probability of a hip fracture is 0.7 within the next ten years. I have reviewed this report and agree with the above findings. Mark A. Thornton Papas, M.D. [facility] Electronically Signed   By: Lavonia Dana M.D.   On: 05/09/2020 16:51   MM DIAG BREAST TOMO BILATERAL  Result Date: 05/09/2020 CLINICAL DATA:  History of right breast cancer status post lumpectomy in 2020. EXAM: DIGITAL DIAGNOSTIC BILATERAL MAMMOGRAM WITH TOMOSYNTHESIS AND CAD TECHNIQUE: Bilateral digital diagnostic mammography and breast tomosynthesis was performed. The images were evaluated with computer-aided detection. COMPARISON:  Previous exam(s). ACR Breast Density Category b: There are scattered areas of fibroglandular density. FINDINGS: Stable lumpectomy changes are seen in the right breast. No suspicious mass or malignant type microcalcifications identified in either breast. IMPRESSION: No evidence of malignancy in either breast. RECOMMENDATION: Bilateral diagnostic mammogram in 1 year is recommended. I have discussed the findings and recommendations with the patient. If applicable, a reminder letter will be sent to the patient regarding the next appointment. BI-RADS CATEGORY  2: Benign. Electronically Signed   By:  Lillia Mountain M.D.   On: 05/09/2020 13:44      Assessment and Plan: Patient Active Problem List   Diagnosis Date Noted  . Lumbar radicular pain 12/08/2019  . 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) 10/27/2019  . Lumbar facet arthropathy 10/27/2019  . Trochanteric bursitis of both hips 10/27/2019  . Chronic pain syndrome 10/27/2019  . Respiratory tract infection due to COVID-19 virus 03/27/2019  . Cough 03/27/2019  . Invasive carcinoma of breast ([place]) 03/24/2018  . Goals of care, counseling/discussion 03/24/2018  . Malignant neoplasm of right breast greater than or equal to 2 cm in greatest dimension ([place])   . Mass of right breast 02/24/2018    1. Chronic obstructive pulmonary disease, unspecified COPD type ([place]) Recent PFT  showed FEV1 1.73 L, 69%.  Continue current inhalers as prescribed.  2. Nicotine dependence with current use Patient recently started on Wellbutrin and is actively cutting down on number of cigarettes per day.  Educated on the importance of smoking cessation and patient is interested in quitting.  3. Nocturnal hypoxia Continue nightly oxygen use  4. Obstructive sleep apnea syndrome Patient is willing to retry CPAP with alternate mask after recent overnight oximetry results and discussion of previous OSA severity results.  Will likely need to do a full restart with new testing-will look into this and order new PSG/CPAP as needed.  5. GAD (generalized anxiety disorder) Heart rate elevated initially likely secondary to anxiety and possibly medication (albuterol) heart rate improved on exam.  Continue anxiety medications as prescribed and follow-up with PCP.   General Counseling: I have discussed the findings of the evaluation and examination with [facility].  I have also discussed any further diagnostic evaluation thatmay be needed or ordered today. Tinley verbalizes understanding of the findings of todays visit. We also reviewed her medications today and  discussed drug interactions and side effects including but not limited excessive drowsiness and altered mental states. We also discussed that there is always a risk not just to her but also people around her. she has been encouraged to call the office with any questions or concerns that should arise related to todays visit.  No orders of the defined types were placed in this encounter.    Time spent: 30  I have personally obtained a history, examined the patient, evaluated laboratory and imaging results, formulated the assessment and plan and placed orders. This patient was seen by Drema Dallas, PA-C in collaboration with Dr. Devona Konig as a part of collaborative care agreement.     Allyne Gee, MD [facility] Sleep medicine

## 2020-06-05 NOTE — Patient Instructions (Signed)
Chronic Obstructive Pulmonary Disease  Chronic obstructive pulmonary disease (COPD) is a long-term (chronic) lung problem. When you have COPD, it is hard for air to get in and out of your lungs. Usually the condition gets worse over time, and your lungs will never return to normal. There are things you can do to keep yourself as healthy as possible. What are the causes?  Smoking. This is the most common cause.  Certain genes passed from parent to child (inherited). What increases the risk?  Being exposed to secondhand smoke from cigarettes, pipes, or cigars.  Being exposed to chemicals and other irritants, such as fumes and dust in the work environment.  Having chronic lung conditions or infections. What are the signs or symptoms?  Shortness of breath, especially during physical activity.  A long-term cough with a large amount of thick mucus. Sometimes, the cough may not have any mucus (dry cough).  Wheezing.  Breathing quickly.  Skin that looks gray or blue, especially in the fingers, toes, or lips.  Feeling tired (fatigue).  Weight loss.  Chest tightness.  Having infections often.  Episodes when breathing symptoms become much worse (exacerbations). At the later stages of this disease, you may have swelling in the ankles, feet, or legs. How is this treated?  Taking medicines.  Quitting smoking, if you smoke.  Rehabilitation. This includes steps to make your body work better. It may involve a team of specialists.  Doing exercises.  Making changes to your diet.  Using oxygen.  Lung surgery.  Lung transplant.  Comfort measures (palliative care). Follow these instructions at home: Medicines  Take over-the-counter and prescription medicines only as told by your doctor.  Talk to your doctor before taking any cough or allergy medicines. You may need to avoid medicines that cause your lungs to be dry. Lifestyle  If you smoke, stop smoking. Smoking makes the  problem worse.  Do not smoke or use any products that contain nicotine or tobacco. If you need help quitting, ask your doctor.  Avoid being around things that make your breathing worse. This may include smoke, chemicals, and fumes.  Stay active, but remember to rest as well.  Learn and use tips on how to manage stress and control your breathing.  Make sure you get enough sleep. Most adults need at least 7 hours of sleep every night.  Eat healthy foods. Eat smaller meals more often. Rest before meals. Controlled breathing Learn and use tips on how to control your breathing as told by your doctor. Try:  Breathing in (inhaling) through your nose for 1 second. Then, pucker your lips and breath out (exhale) through your lips for 2 seconds.  Putting one hand on your belly (abdomen). Breathe in slowly through your nose for 1 second. Your hand on your belly should move out. Pucker your lips and breathe out slowly through your lips. Your hand on your belly should move in as you breathe out.   Controlled coughing Learn and use controlled coughing to clear mucus from your lungs. Follow these steps: 1. Lean your head a little forward. 2. Breathe in deeply. 3. Try to hold your breath for 3 seconds. 4. Keep your mouth slightly open while coughing 2 times. 5. Spit any mucus out into a tissue. 6. Rest and do the steps again 1 or 2 times as needed. General instructions  Make sure you get all the shots (vaccines) that your doctor recommends. Ask your doctor about a flu shot and a pneumonia shot.    Use oxygen therapy and pulmonary rehabilitation if told by your doctor. If you need home oxygen therapy, ask your doctor if you should buy a tool to measure your oxygen level (oximeter).  Make a COPD action plan with your doctor. This helps you to know what to do if you feel worse than usual.  Manage any other conditions you have as told by your doctor.  Avoid going outside when it is very hot, cold, or  humid.  Avoid people who have a sickness you can catch (contagious).  Keep all follow-up visits. Contact a doctor if:  You cough up more mucus than usual.  There is a change in the color or thickness of the mucus.  It is harder to breathe than usual.  Your breathing is faster than usual.  You have trouble sleeping.  You need to use your medicines more often than usual.  You have trouble doing your normal activities such as getting dressed or walking around the house. Get help right away if:  You have shortness of breath while resting.  You have shortness of breath that stops you from: ? Being able to talk. ? Doing normal activities.  Your chest hurts for longer than 5 minutes.  Your skin color is more blue than usual.  Your pulse oximeter shows that you have low oxygen for longer than 5 minutes.  You have a fever.  You feel too tired to breathe normally. These symptoms may represent a serious problem that is an emergency. Do not wait to see if the symptoms will go away. Get medical help right away. Call your local emergency services (911 in the [place] Do not drive yourself to the hospital. Summary  Chronic obstructive pulmonary disease (COPD) is a long-term lung problem.  The way your lungs work will never return to normal. Usually the condition gets worse over time. There are things you can do to keep yourself as healthy as possible.  Take over-the-counter and prescription medicines only as told by your doctor.  If you smoke, stop. Smoking makes the problem worse. This information is not intended to replace advice given to you by your health care provider. Make sure you discuss any questions you have with your health care provider. Document Revised: 12/01/2019 Document Reviewed: 12/01/2019 Elsevier Patient Education  2021 Elsevier Inc.   

## 2020-06-06 ENCOUNTER — Other Ambulatory Visit: Payer: Self-pay | Admitting: Hospice and Palliative Medicine

## 2020-06-06 ENCOUNTER — Other Ambulatory Visit: Payer: Self-pay | Admitting: Student in an Organized Health Care Education/Training Program

## 2020-06-06 DIAGNOSIS — M5441 Lumbago with sciatica, right side: Secondary | ICD-10-CM

## 2020-06-06 DIAGNOSIS — G8929 Other chronic pain: Secondary | ICD-10-CM

## 2020-06-10 ENCOUNTER — Other Ambulatory Visit: Payer: Self-pay | Admitting: Physician Assistant

## 2020-06-10 DIAGNOSIS — G4734 Idiopathic sleep related nonobstructive alveolar hypoventilation: Secondary | ICD-10-CM

## 2020-06-10 DIAGNOSIS — G4733 Obstructive sleep apnea (adult) (pediatric): Secondary | ICD-10-CM

## 2020-06-15 ENCOUNTER — Other Ambulatory Visit: Payer: Self-pay | Admitting: Hospice and Palliative Medicine

## 2020-06-15 DIAGNOSIS — F411 Generalized anxiety disorder: Secondary | ICD-10-CM

## 2020-06-27 ENCOUNTER — Other Ambulatory Visit: Payer: Self-pay | Admitting: Internal Medicine

## 2020-06-27 DIAGNOSIS — F411 Generalized anxiety disorder: Secondary | ICD-10-CM

## 2020-06-27 MED ORDER — ALPRAZOLAM 0.25 MG PO TABS: 0.2500 mg | ORAL_TABLET | Freq: Two times a day (BID) | ORAL | 0 refills | Status: DC | PRN

## 2020-07-19 ENCOUNTER — Other Ambulatory Visit: Payer: Self-pay | Admitting: Internal Medicine

## 2020-07-19 DIAGNOSIS — G4709 Other insomnia: Secondary | ICD-10-CM

## 2020-07-19 DIAGNOSIS — F411 Generalized anxiety disorder: Secondary | ICD-10-CM

## 2020-07-26 ENCOUNTER — Telehealth: Payer: Self-pay

## 2020-07-26 NOTE — Telephone Encounter (Signed)
Completed medical records for [facility] Mailed to [id number] [place], [place], [address]

## 2020-07-27 ENCOUNTER — Other Ambulatory Visit: Payer: Self-pay

## 2020-07-27 DIAGNOSIS — Z9109 Other allergy status, other than to drugs and biological substances: Secondary | ICD-10-CM

## 2020-07-27 MED ORDER — MONTELUKAST SODIUM 10 MG PO TABS: 10.0000 mg | ORAL_TABLET | Freq: Every day | ORAL | 3 refills | Status: DC

## 2020-08-04 ENCOUNTER — Other Ambulatory Visit: Payer: Self-pay | Admitting: Oncology

## 2020-08-04 ENCOUNTER — Other Ambulatory Visit: Payer: Self-pay | Admitting: Internal Medicine

## 2020-08-04 DIAGNOSIS — G4709 Other insomnia: Secondary | ICD-10-CM

## 2020-08-04 DIAGNOSIS — G8929 Other chronic pain: Secondary | ICD-10-CM

## 2020-08-04 MED ORDER — TRAZODONE HCL 50 MG PO TABS: ORAL_TABLET | ORAL | 0 refills | Status: DC

## 2020-08-10 ENCOUNTER — Other Ambulatory Visit: Payer: Self-pay

## 2020-08-10 ENCOUNTER — Encounter: Payer: Self-pay | Admitting: Nurse Practitioner

## 2020-08-10 ENCOUNTER — Ambulatory Visit: Payer: Medicaid Other | Admitting: Nurse Practitioner

## 2020-08-10 VITALS — BP 111/78 | HR 98 | Temp 98.6°F | Resp 16 | Ht 62.0 in | Wt 213.4 lb

## 2020-08-10 DIAGNOSIS — J449 Chronic obstructive pulmonary disease, unspecified: Secondary | ICD-10-CM

## 2020-08-10 DIAGNOSIS — F411 Generalized anxiety disorder: Secondary | ICD-10-CM

## 2020-08-10 DIAGNOSIS — Z9109 Other allergy status, other than to drugs and biological substances: Secondary | ICD-10-CM

## 2020-08-10 DIAGNOSIS — E782 Mixed hyperlipidemia: Secondary | ICD-10-CM

## 2020-08-10 DIAGNOSIS — F17200 Nicotine dependence, unspecified, uncomplicated: Secondary | ICD-10-CM

## 2020-08-10 DIAGNOSIS — R7303 Prediabetes: Secondary | ICD-10-CM

## 2020-08-10 DIAGNOSIS — G4709 Other insomnia: Secondary | ICD-10-CM

## 2020-08-10 DIAGNOSIS — M5442 Lumbago with sciatica, left side: Secondary | ICD-10-CM

## 2020-08-10 DIAGNOSIS — G8929 Other chronic pain: Secondary | ICD-10-CM

## 2020-08-10 DIAGNOSIS — M5441 Lumbago with sciatica, right side: Secondary | ICD-10-CM

## 2020-08-10 DIAGNOSIS — F321 Major depressive disorder, single episode, moderate: Secondary | ICD-10-CM

## 2020-08-10 LAB — POCT GLYCOSYLATED HEMOGLOBIN (HGB A1C): Hemoglobin A1C: 6.1 % — AB (ref 4.0–5.6)

## 2020-08-10 MED ORDER — TRAZODONE HCL 50 MG PO TABS: 50.0000 mg | ORAL_TABLET | Freq: Every evening | ORAL | 2 refills | Status: DC | PRN

## 2020-08-10 MED ORDER — BUPROPION HCL ER (XL) 150 MG PO TB24: 150.0000 mg | ORAL_TABLET | Freq: Every day | ORAL | 1 refills | Status: DC

## 2020-08-10 MED ORDER — BUSPIRONE HCL 15 MG PO TABS: 15.0000 mg | ORAL_TABLET | Freq: Two times a day (BID) | ORAL | 1 refills | Status: DC

## 2020-08-10 MED ORDER — AZELASTINE HCL 0.1 % NA SOLN: 1.0000 | Freq: Two times a day (BID) | NASAL | 12 refills | Status: DC

## 2020-08-10 MED ORDER — ROSUVASTATIN CALCIUM 5 MG PO TABS
5.0000 mg | ORAL_TABLET | Freq: Every day | ORAL | 3 refills | Status: DC
Start: 2020-08-10 — End: 2021-07-19

## 2020-08-10 MED ORDER — SPIRIVA HANDIHALER 18 MCG IN CAPS: 18.0000 ug | ORAL_CAPSULE | Freq: Every day | RESPIRATORY_TRACT | 3 refills | Status: DC

## 2020-08-10 MED ORDER — TIZANIDINE HCL 4 MG PO TABS: 4.0000 mg | ORAL_TABLET | Freq: Four times a day (QID) | ORAL | 0 refills | Status: DC | PRN

## 2020-08-10 MED ORDER — MONTELUKAST SODIUM 10 MG PO TABS: 10.0000 mg | ORAL_TABLET | Freq: Every day | ORAL | 3 refills | Status: DC

## 2020-08-10 MED ORDER — SYMBICORT 160-4.5 MCG/ACT IN AERO: 2.0000 | INHALATION_SPRAY | Freq: Two times a day (BID) | RESPIRATORY_TRACT | 3 refills | Status: DC

## 2020-08-10 MED ORDER — IPRATROPIUM-ALBUTEROL 0.5-2.5 (3) MG/3ML IN SOLN
3.0000 mL | Freq: Four times a day (QID) | RESPIRATORY_TRACT | 3 refills | Status: DC | PRN
Start: 2020-08-10 — End: 2021-10-16

## 2020-08-10 MED ORDER — LEVOCETIRIZINE DIHYDROCHLORIDE 5 MG PO TABS: 5.0000 mg | ORAL_TABLET | Freq: Every evening | ORAL | 1 refills | Status: DC

## 2020-08-10 MED ORDER — CELECOXIB 100 MG PO CAPS
100.0000 mg | ORAL_CAPSULE | Freq: Two times a day (BID) | ORAL | 1 refills | Status: DC
Start: 2020-08-10 — End: 2021-04-14

## 2020-08-10 MED ORDER — SERTRALINE HCL 100 MG PO TABS: 150.0000 mg | ORAL_TABLET | Freq: Every day | ORAL | 1 refills | Status: DC

## 2020-08-10 MED ORDER — ALBUTEROL SULFATE HFA 108 (90 BASE) MCG/ACT IN AERS: 2.0000 | INHALATION_SPRAY | Freq: Four times a day (QID) | RESPIRATORY_TRACT | 0 refills | Status: DC | PRN

## 2020-08-10 MED ORDER — METFORMIN HCL 500 MG PO TABS: 500.0000 mg | ORAL_TABLET | Freq: Two times a day (BID) | ORAL | 3 refills | Status: DC

## 2020-08-10 NOTE — Progress Notes (Signed)
[facility] [place], [place] [address]  Internal MEDICINE  Office Visit Note  Patient Name: Margaret Blackburn  [id number]  [id number]  Date of Service: 08/10/2020  Chief Complaint  Patient presents with   Follow-up    Med refills   Prediabetes    A1C    HPI Margaret Blackburn presents for a follow up visit for medication refills, prediabetes and to check her A1C. She has a history of environmental allergies, arthritis, COPD, depression, and sleep apnea. She had carcinoma of the left breast, s/p radiation therapy, and then started on anastrozole in 07-04-2018. Margaret Blackburn's father died recently and her anxiety has gotten worse. She is also having trouble sleeping.  -Margaret Blackburn has prediabetes, A1C is 6.1 today, which is up from 5.8 a year ago.  Margaret Blackburn has significant anxiety and takes buspirone and sertraline. It is not adequately controlled on current dose. She is requesting refills of several medications.   Current Medication: Outpatient Encounter Medications as of 08/10/2020  Medication Sig Note   anastrozole (ARIMIDEX) 1 MG tablet TAKE 1 TABLET BY MOUTH ONCE DAILY    aspirin EC 81 MG tablet Take 81 mg by mouth daily. Swallow whole.    busPIRone (BUSPAR) 15 MG tablet Take 1 tablet (15 mg total) by mouth 2 (two) times daily.    Calcium Carb-Cholecalciferol (CALCIUM 600+D3 PO) Take 1 tablet by mouth in the morning and at bedtime. 08/25/2019: Pt does not know dose of vit d 3   metFORMIN (GLUCOPHAGE) 500 MG tablet Take 1 tablet (500 mg total) by mouth 2 (two) times daily with a meal.    Multiple Vitamins-Minerals (ALIVE WOMENS ENERGY PO) Take 1 Dose by mouth daily.    pregabalin (LYRICA) 25 MG capsule TAKE 1 CAPSULE BY MOUTH TWICE DAILY    [DISCONTINUED] albuterol (VENTOLIN HFA) 108 (90 Base) MCG/ACT inhaler Inhale 2 puffs into the lungs every 6 (six) hours as needed for wheezing or shortness of breath.    [DISCONTINUED] ALPRAZolam (XANAX) 0.25 MG tablet Take 1 tablet (0.25 mg  total) by mouth 2 (two) times daily as needed for anxiety.    [DISCONTINUED] azelastine (ASTELIN) 0.1 % nasal spray Place 1 spray into both nostrils 2 (two) times daily. Use in each nostril as directed    [DISCONTINUED] buPROPion (WELLBUTRIN XL) 150 MG 24 hr tablet Take 1 tablet (150 mg total) by mouth daily.    [DISCONTINUED] busPIRone (BUSPAR) 5 MG tablet TAKE 1 TABLET 3 TIMES A DAY    [DISCONTINUED] celecoxib (CELEBREX) 100 MG capsule Take 100 mg by mouth 2 (two) times daily.    [DISCONTINUED] ipratropium-albuterol (DUONEB) 0.5-2.5 (3) MG/3ML SOLN Take 3 mLs by nebulization every 6 (six) hours as needed.    [DISCONTINUED] levocetirizine (XYZAL) 5 MG tablet Take 1 tablet (5 mg total) by mouth every evening.    [DISCONTINUED] montelukast (SINGULAIR) 10 MG tablet Take 1 tablet (10 mg total) by mouth at bedtime.    [DISCONTINUED] rosuvastatin (CRESTOR) 5 MG tablet Take 1 tablet (5 mg total) by mouth daily.    [DISCONTINUED] sertraline (ZOLOFT) 100 MG tablet Take 1 tablet (100 mg total) by mouth daily.    [DISCONTINUED] SYMBICORT 160-4.5 MCG/ACT inhaler INHALE 2 PUFFS INTO THE LUNGS 2 TIMES DAILY    [DISCONTINUED] tiotropium (SPIRIVA HANDIHALER) 18 MCG inhalation capsule Place 1 capsule (18 mcg total) into inhaler and inhale daily.    [DISCONTINUED] tiZANidine (ZANAFLEX) 4 MG tablet TAKE 1 TABLET BY MOUTH EVERY 6 HOURS AS NEEDED FOR MUSCLE  SPASMS    [DISCONTINUED] traZODone (DESYREL) 50 MG tablet TAKE 1/2 TO 1 TABLET (25-50MG  TOTAL) BY MOUTH AT BEDTIME AS NEEDED FOR SLEEP    albuterol (VENTOLIN HFA) 108 (90 Base) MCG/ACT inhaler Inhale 2 puffs into the lungs every 6 (six) hours as needed for wheezing or shortness of breath.    azelastine (ASTELIN) 0.1 % nasal spray Place 1 spray into both nostrils 2 (two) times daily. Use in each nostril as directed    buPROPion (WELLBUTRIN XL) 150 MG 24 hr tablet Take 1 tablet (150 mg total) by mouth daily.    celecoxib (CELEBREX) 100 MG capsule Take 1 capsule (100  mg total) by mouth 2 (two) times daily.    ipratropium-albuterol (DUONEB) 0.5-2.5 (3) MG/3ML SOLN Take 3 mLs by nebulization every 6 (six) hours as needed.    levocetirizine (XYZAL) 5 MG tablet Take 1 tablet (5 mg total) by mouth every evening.    Melatonin 1 MG CAPS Take 1 mg by mouth at bedtime as needed. (Patient not taking: Reported on 08/10/2020)    montelukast (SINGULAIR) 10 MG tablet Take 1 tablet (10 mg total) by mouth at bedtime.    rosuvastatin (CRESTOR) 5 MG tablet Take 1 tablet (5 mg total) by mouth daily.    sertraline (ZOLOFT) 100 MG tablet Take 1.5 tablets (150 mg total) by mouth daily.    SYMBICORT 160-4.5 MCG/ACT inhaler Inhale 2 puffs into the lungs 2 (two) times daily.    tiotropium (SPIRIVA HANDIHALER) 18 MCG inhalation capsule Place 1 capsule (18 mcg total) into inhaler and inhale daily.    tiZANidine (ZANAFLEX) 4 MG tablet Take 1 tablet (4 mg total) by mouth every 6 (six) hours as needed for muscle spasms.    [DISCONTINUED] acetaminophen (TYLENOL) 500 MG tablet Take 500 mg by mouth every 6 (six) hours as needed. (Patient not taking: Reported on 08/10/2020)    [DISCONTINUED] aspirin 81 MG chewable tablet Chew 81 mg by mouth daily. (Patient not taking: Reported on 08/10/2020)    [DISCONTINUED] traZODone (DESYREL) 50 MG tablet Take 1-2 tablets (50-100 mg total) by mouth at bedtime as needed for sleep. TAKE 1/2 TO 1 TABLET (25-50MG  TOTAL) BY MOUTH AT BEDTIME AS NEEDED FOR SLEEP    No facility-administered encounter medications on file as of 08/10/2020.    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   BREAST LUMPECTOMY WITH NEEDLE LOCALIZATION Right 03/03/2018   Procedure: BREAST LUMPECTOMY WITH NEEDLE LOCALIZATION;  Surgeon: Vickie Epley, MD;  Location: [facility] ORS;  Service: General;   Laterality: Right;   COLONOSCOPY WITH PROPOFOL N/A 08/31/2019   Procedure: COLONOSCOPY WITH PROPOFOL;  Surgeon: Jonathon Bellows, MD;  Location: [facility] ENDOSCOPY;  Service: Gastroenterology;  Laterality: N/A;   SENTINEL NODE BIOPSY Right 03/21/2018   Procedure: RIGHT SENTINEL LYMPH  NODE BIOPSY;  Surgeon: Vickie Epley, MD;  Location: [facility] ORS;  Service: General;  Laterality: Right;    Medical History: Past Medical History:  Diagnosis Date   Allergy    Arthritis    Breast cancer ([place]) 12/2017   right breast   COPD (chronic obstructive pulmonary disease) ([place])    Depression    Personal history of radiation therapy    Prediabetes    Sleep apnea    supposed to get CPAP  03/25/19    Family History: Family History  Problem Relation Age of Onset   Breast cancer Mother 71   Lung cancer Mother    Breast cancer Maternal Grandmother    Thyroid cancer Father     Social History   Socioeconomic History   Marital status: Single    Spouse name: Not on file   Number of children: 2   Years of education: Not on file   Highest education level: Not on file  Occupational History   Not on file  Tobacco Use   Smoking status: Every Day    Packs/day: 2.00    Years: 39.00    Pack years: 78.00    Types: Cigarettes   Smokeless tobacco: Never   Tobacco comments:    in the process of trying to quit-chantix  Vaping Use   Vaping Use: Former   Devices: used  2 weeks per pt  Substance and Sexual Activity   Alcohol use: No   Drug use: Yes    Types: Marijuana    Comment: occ   Sexual activity: Yes  Other Topics Concern   Not on file  Social History Narrative   Not on file   Social Determinants of Health   Financial Resource Strain: Not on file  Food Insecurity: Not on file  Transportation Needs: Not on file  Physical Activity: Not on file  Stress: Not on file  Social Connections: Not on file  Intimate Partner Violence: Not on file      Review of Systems  Constitutional:  Positive  for fatigue. Negative for appetite change, chills, fever and unexpected weight change.  HENT: Negative.  Negative for congestion.   Respiratory:  Negative for cough, chest tightness, shortness of breath and wheezing.   Gastrointestinal:  Negative for abdominal pain, constipation, diarrhea, nausea and vomiting.  Musculoskeletal:  Positive for arthralgias and back pain.  Allergic/Immunologic: Positive for environmental allergies.  Neurological:  Negative for dizziness, light-headedness and headaches.  Psychiatric/Behavioral:  Positive for behavioral problems (depression) and sleep disturbance. Negative for self-injury and suicidal ideas. The patient is nervous/anxious.    Vital Signs: BP 111/78   Pulse 98   Temp 98.6 F (37 C)   Resp 16   Ht 5\' 2"  (1.575 m)   Wt 213 lb 6.4 oz (96.8 kg)   SpO2 99%   BMI 39.03 kg/m    Physical Exam Vitals reviewed.  Constitutional:      General: She is not in acute distress.    Appearance: Normal appearance. She is obese. She is not ill-appearing.  HENT:     Head: Normocephalic and atraumatic.  Cardiovascular:     Rate and Rhythm: Normal rate and regular rhythm.     Pulses: Normal pulses.     Heart sounds: Normal heart sounds. No murmur heard. Pulmonary:     Effort: Pulmonary effort is normal. No respiratory distress.     Breath sounds: Normal breath sounds. No wheezing.  Skin:    General: Skin is warm and dry.     Capillary Refill: Capillary refill takes less than 2 seconds.  Neurological:     Mental Status: She is alert and oriented to person, place, and time.  Psychiatric:        Mood and Affect: Mood is anxious. Affect is tearful.        Behavior: Behavior normal. Behavior is cooperative.    Assessment/Plan: 1. Prediabetes A1C is 6.1 which has increased by 0.3, continue metformin as prescribed, refills ordered.  -  POCT glycosylated hemoglobin (Hb A1C) - metFORMIN (GLUCOPHAGE) 500 MG tablet; Take 1 tablet (500 mg total) by mouth 2  (two) times daily with a meal.  Dispense: 180 tablet; Refill: 3  2. Chronic obstructive pulmonary disease, unspecified COPD type ([place]) Stable with current medications, continue as prescribed.  - ipratropium-albuterol (DUONEB) 0.5-2.5 (3) MG/3ML SOLN; Take 3 mLs by nebulization every 6 (six) hours as needed.  Dispense: 360 mL; Refill: 3 - albuterol (VENTOLIN HFA) 108 (90 Base) MCG/ACT inhaler; Inhale 2 puffs into the lungs every 6 (six) hours as needed for wheezing or shortness of breath.  Dispense: 18 g; Refill: 0 - tiotropium (SPIRIVA HANDIHALER) 18 MCG inhalation capsule; Place 1 capsule (18 mcg total) into inhaler and inhale daily.  Dispense: 30 capsule; Refill: 3 - SYMBICORT 160-4.5 MCG/ACT inhaler; Inhale 2 puffs into the lungs 2 (two) times daily.  Dispense: 10.2 g; Refill: 3  3. GAD (generalized anxiety disorder) Anxiety not well controlled at this time, sertraline dose increased to 150 mg daily and buspirone dose increased to 15 mg twice daily. Refills ordered.  - buPROPion (WELLBUTRIN XL) 150 MG 24 hr tablet; Take 1 tablet (150 mg total) by mouth daily.  Dispense: 90 tablet; Refill: 1 - sertraline (ZOLOFT) 100 MG tablet; Take 1.5 tablets (150 mg total) by mouth daily.  Dispense: 135 tablet; Refill: 1 - busPIRone (BUSPAR) 15 MG tablet; Take 1 tablet (15 mg total) by mouth 2 (two) times daily.  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  Dispense: 180 capsule; Refill: 1 - tiZANidine (ZANAFLEX) 4 MG tablet; Take 1 tablet (4 mg total) by mouth every 6 (six) hours as needed for muscle spasms.  Dispense: 30 tablet; Refill: 0  5. Depression, major, single episode, moderate ([facility]) Taking bupropion and sertraline for depression. - buPROPion (WELLBUTRIN XL) 150 MG 24 hr tablet; Take 1 tablet (150 mg total) by mouth daily.  Dispense: 90 tablet; Refill:  1 - sertraline (ZOLOFT) 100 MG tablet; Take 1.5 tablets (150 mg total) by mouth daily.  Dispense: 135 tablet; Refill: 1  6. Nicotine dependence with current use Bupropion helps with her nicotine craving, and she is working on smoking cessation.  - buPROPion (WELLBUTRIN XL) 150 MG 24 hr tablet; Take 1 tablet (150 mg total) by mouth daily.  Dispense: 90 tablet; Refill: 1  7. Other insomnia Stable, taking trazodone.   8. Mixed hyperlipidemia Stable with current medication, refill ordered.  - rosuvastatin (CRESTOR) 5 MG tablet; Take 1 tablet (5 mg total) by mouth daily.  Dispense: 90 tablet; Refill: 3  9. Environmental allergies Nasal spray and levoceterizine refill ordered.  - levocetirizine (XYZAL) 5 MG tablet; Take 1 tablet (5 mg total) by mouth every evening.  Dispense: 90 tablet; Refill: 1 - azelastine (ASTELIN) 0.1 % nasal spray; Place 1 spray into both nostrils 2 (two) times daily. Use in each nostril as directed  Dispense: 30 mL; Refill: 12 - montelukast (SINGULAIR) 10 MG tablet; Take 1 tablet (10 mg total) by mouth at bedtime.  Dispense: 30 tablet; Refill: 3   General Counseling: Margaret Blackburn verbalizes understanding of the findings of todays visit and agrees with plan of treatment. I have discussed any further diagnostic evaluation that may be needed or ordered today. We also reviewed her medications today. she has been encouraged to call the office with any questions or concerns that  should arise related to todays visit.    Orders Placed This Encounter  Procedures   POCT glycosylated hemoglobin (Hb A1C)    Meds ordered this encounter  Medications   metFORMIN (GLUCOPHAGE) 500 MG tablet    Sig: Take 1 tablet (500 mg total) by mouth 2 (two) times daily with a meal.    Dispense:  180 tablet    Refill:  3   levocetirizine (XYZAL) 5 MG tablet    Sig: Take 1 tablet (5 mg total) by mouth every evening.    Dispense:  90 tablet    Refill:  1   rosuvastatin (CRESTOR) 5 MG tablet     Sig: Take 1 tablet (5 mg total) by mouth daily.    Dispense:  90 tablet    Refill:  3   ipratropium-albuterol (DUONEB) 0.5-2.5 (3) MG/3ML SOLN    Sig: Take 3 mLs by nebulization every 6 (six) hours as needed.    Dispense:  360 mL    Refill:  3   albuterol (VENTOLIN HFA) 108 (90 Base) MCG/ACT inhaler    Sig: Inhale 2 puffs into the lungs every 6 (six) hours as needed for wheezing or shortness of breath.    Dispense:  18 g    Refill:  0   tiotropium (SPIRIVA HANDIHALER) 18 MCG inhalation capsule    Sig: Place 1 capsule (18 mcg total) into inhaler and inhale daily.    Dispense:  30 capsule    Refill:  3   buPROPion (WELLBUTRIN XL) 150 MG 24 hr tablet    Sig: Take 1 tablet (150 mg total) by mouth daily.    Dispense:  90 tablet    Refill:  1   sertraline (ZOLOFT) 100 MG tablet    Sig: Take 1.5 tablets (150 mg total) by mouth daily.    Dispense:  135 tablet    Refill:  1   celecoxib (CELEBREX) 100 MG capsule    Sig: Take 1 capsule (100 mg total) by mouth 2 (two) times daily.    Dispense:  180 capsule    Refill:  1   azelastine (ASTELIN) 0.1 % nasal spray    Sig: Place 1 spray into both nostrils 2 (two) times daily. Use in each nostril as directed    Dispense:  30 mL    Refill:  12   SYMBICORT 160-4.5 MCG/ACT inhaler    Sig: Inhale 2 puffs into the lungs 2 (two) times daily.    Dispense:  10.2 g    Refill:  3   busPIRone (BUSPAR) 15 MG tablet    Sig: Take 1 tablet (15 mg total) by mouth 2 (two) times daily.    Dispense:  180 tablet    Refill:  1   tiZANidine (ZANAFLEX) 4 MG tablet    Sig: Take 1 tablet (4 mg total) by mouth every 6 (six) hours as needed for muscle spasms.    Dispense:  30 tablet    Refill:  0   DISCONTD: traZODone (DESYREL) 50 MG tablet    Sig: Take 1-2 tablets (50-100 mg total) by mouth at bedtime as needed for sleep. TAKE 1/2 TO 1 TABLET (25-50MG  TOTAL) BY MOUTH AT BEDTIME AS NEEDED FOR SLEEP    Dispense:  60 tablet    Refill:  2   montelukast (SINGULAIR) 10  MG tablet    Sig: Take 1 tablet (10 mg total) by mouth at bedtime.    Dispense:  30 tablet    Refill:  3    Return in about 4 weeks (around 09/07/2020) for F/U, Anxiety/depression, Margaret Blackburn PCP or earliest available. .   Total time spent:30 Minutes Time spent includes review of chart, medications, test results, and follow up plan with the patient.   [place] Controlled Substance Database was reviewed by me.  This patient was seen by Jonetta Osgood, FNP-C in collaboration with Dr. Clayborn Bigness as a part of collaborative care agreement.   Shaquisha Wynn R. Valetta Fuller, MSN, FNP-C Internal medicine

## 2020-08-13 ENCOUNTER — Other Ambulatory Visit: Payer: Self-pay | Admitting: Internal Medicine

## 2020-08-13 DIAGNOSIS — G8929 Other chronic pain: Secondary | ICD-10-CM

## 2020-08-15 MED ORDER — ALPRAZOLAM 0.5 MG PO TABS: 0.5000 mg | ORAL_TABLET | Freq: Two times a day (BID) | ORAL | 0 refills | Status: DC | PRN

## 2020-08-15 MED ORDER — PREGABALIN 25 MG PO CAPS: 25.0000 mg | ORAL_CAPSULE | Freq: Two times a day (BID) | ORAL | 2 refills | Status: DC

## 2020-08-15 NOTE — Telephone Encounter (Signed)
Please review and fill if appropriate LOV: 08/10/20 NOV: 09/01/20

## 2020-08-22 ENCOUNTER — Telehealth: Payer: Self-pay | Admitting: Oncology

## 2020-08-22 NOTE — Telephone Encounter (Signed)
Patient called to reschedule her VV with Dr. Janese Banks from January.  Appointment rescheduled however patient states she is out of her Arimidex rx, requesting a refill.  Routing to clinical team for follow up.

## 2020-08-23 ENCOUNTER — Other Ambulatory Visit: Payer: Self-pay | Admitting: *Deleted

## 2020-08-23 MED ORDER — ANASTROZOLE 1 MG PO TABS: 1.0000 mg | ORAL_TABLET | Freq: Every day | ORAL | 0 refills | Status: DC

## 2020-08-23 NOTE — Telephone Encounter (Signed)
Called pt and let her know that we will refill 1 month supply and the pt has r/s appt from Jan. And she has video visit on 7/25. Patient says she will be ready for visit and if all is well then we can send in refills of anastrozole. Pt agreeable. Refill pt in for 1 month of anastrozole

## 2020-08-25 ENCOUNTER — Other Ambulatory Visit: Payer: Self-pay | Admitting: Internal Medicine

## 2020-08-29 ENCOUNTER — Encounter: Payer: Self-pay | Admitting: Oncology

## 2020-08-29 ENCOUNTER — Inpatient Hospital Stay: Payer: Medicaid Other | Attending: Oncology | Admitting: Oncology

## 2020-08-29 DIAGNOSIS — Z08 Encounter for follow-up examination after completed treatment for malignant neoplasm: Secondary | ICD-10-CM

## 2020-08-29 DIAGNOSIS — Z853 Personal history of malignant neoplasm of breast: Secondary | ICD-10-CM | POA: Diagnosis not present

## 2020-08-29 DIAGNOSIS — Z79811 Long term (current) use of aromatase inhibitors: Secondary | ICD-10-CM | POA: Diagnosis not present

## 2020-08-29 DIAGNOSIS — Z5181 Encounter for therapeutic drug level monitoring: Secondary | ICD-10-CM

## 2020-08-29 NOTE — Progress Notes (Signed)
I connected with Margaret Blackburn on 08/29/20 at  2:45 PM EDT by video enabled telemedicine visit and verified that I am speaking with the correct person using two identifiers.   I discussed the limitations, risks, security and privacy concerns of performing an evaluation and management service by telemedicine and the availability of in-person appointments. I also discussed with the patient that there may be a patient responsible charge related to this service. The patient expressed understanding and agreed to proceed.  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  She recently underwent a screening mammogram in November 2019 which showed focal architectural distortion in the upper outer portion of the right breast.  Ultrasound of the right axilla was negative for adenopathy.  Core biopsy showed atypical small glandular proliferation suspicious for tubular carcinoma.  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  7 mm, grade 1 ER greater than 90% positive, PR 51 to 90% positive and HER-2/neu negative. 5 sentinel LN negative for malignancy   Patient is status post hysterectomy but ovaries still in situ.  Hormone levels were consistent with postmenopausal status.  She completed adjuvant radiation and was started on Arimidex in May 2020    Interval history she continues to have low back pain for which she follows up with interventional pain as well.  She is tolerating Arimidex along with calcium and vitamin D well without any significant side effects.  Reports no new breast concerns   Review of Systems  Constitutional:  Negative for chills, fever,  malaise/fatigue and weight loss.  HENT:  Negative for congestion, ear discharge and nosebleeds.   Eyes:  Negative for blurred vision.  Respiratory:  Negative for cough, hemoptysis, sputum production, shortness of breath and wheezing.   Cardiovascular:  Negative for chest pain, palpitations, orthopnea and claudication.  Gastrointestinal:  Negative for abdominal pain, blood in stool, constipation, diarrhea, heartburn, melena, nausea and vomiting.  Genitourinary:  Negative for dysuria, flank pain, frequency, hematuria and urgency.  Musculoskeletal:  Positive for back pain. Negative for joint pain and myalgias.  Skin:  Negative for rash.  Neurological:  Negative for dizziness, tingling, focal weakness, seizures, weakness and headaches.  Endo/Heme/Allergies:  Does not bruise/bleed easily.  Psychiatric/Behavioral:  Negative for depression and suicidal ideas. The patient does not have insomnia.    Allergies  Allergen Reactions   Penicillins Rash and Other (See Comments)    DID THE REACTION INVOLVE: Swelling of the face/tongue/throat, SOB, or low BP? No Sudden or severe rash/hives, skin peeling, or the inside of the mouth or nose? Yes, blisters. Not immediate. Did it require medical treatment? Yes When did it last happen? 40s If all above answers are "NO", may proceed with cephalosporin use.     Past Medical History:  Diagnosis Date   Allergy    Arthritis    Breast cancer ([place]) 12/2017   right breast   COPD (chronic obstructive pulmonary disease) (HCC)    Depression    Personal history of radiation therapy    Prediabetes    Sleep apnea    supposed to get CPAP 03/25/19    Past Surgical History:  Procedure Laterality Date   ABDOMINAL HYSTERECTOMY  BACK SURGERY     BREAST BIOPSY Right 12/25/2017   affirm bx , x clip,TUBULAR  CARCINOMA   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   BREAST LUMPECTOMY WITH NEEDLE LOCALIZATION Right 03/03/2018   Procedure: BREAST  LUMPECTOMY WITH NEEDLE LOCALIZATION;  Surgeon: Vickie Epley, MD;  Location: [facility] ORS;  Service: General;  Laterality: Right;   COLONOSCOPY WITH PROPOFOL N/A 08/31/2019   Procedure: COLONOSCOPY WITH PROPOFOL;  Surgeon: Jonathon Bellows, MD;  Location: [facility] ENDOSCOPY;  Service: Gastroenterology;  Laterality: N/A;   SENTINEL NODE BIOPSY Right 03/21/2018   Procedure: RIGHT SENTINEL LYMPH  NODE BIOPSY;  Surgeon: Vickie Epley, MD;  Location: [facility] ORS;  Service: General;  Laterality: Right;    Social History   Socioeconomic History   Marital status: Single    Spouse name: Not on file   Number of children: 2   Years of education: Not on file   Highest education level: Not on file  Occupational History   Not on file  Tobacco Use   Smoking status: Every Day    Packs/day: 2.00    Years: 39.00    Pack years: 78.00    Types: Cigarettes   Smokeless tobacco: Never   Tobacco comments:    in the process of trying to quit-chantix  Vaping Use   Vaping Use: Former   Devices: used  2 weeks per pt  Substance and Sexual Activity   Alcohol use: No   Drug use: Yes    Types: Marijuana    Comment: occ   Sexual activity: Yes  Other Topics Concern   Not on file  Social History Narrative   Not on file   Social Determinants of Health   Financial Resource Strain: Not on file  Food Insecurity: Not on file  Transportation Needs: Not on file  Physical Activity: Not on file  Stress: Not on file  Social Connections: Not on file  Intimate Partner Violence: Not on file    Family History  Problem Relation Age of Onset   Breast cancer Mother 36   Lung cancer Mother    Breast cancer Maternal Grandmother    Thyroid cancer Father      Current Outpatient Medications:    albuterol (VENTOLIN HFA) 108 (90 Base) MCG/ACT inhaler, Inhale 2 puffs into the lungs every 6 (six) hours as needed for wheezing or shortness of breath., Disp: 18 g, Rfl: 0   ALPRAZolam (XANAX) 0.5 MG tablet, Take 1 tablet (0.5 mg  total) by mouth 2 (two) times daily as needed for anxiety., Disp: 60 tablet, Rfl: 0   anastrozole (ARIMIDEX) 1 MG tablet, Take 1 tablet (1 mg total) by mouth daily., Disp: 30 tablet, Rfl: 0   aspirin EC 81 MG tablet, Take 81 mg by mouth daily. Swallow whole., Disp: , Rfl:    azelastine (ASTELIN) 0.1 % nasal spray, Place 1 spray into both nostrils 2 (two) times daily. Use in each nostril as directed, Disp: 30 mL, Rfl: 12   buPROPion (WELLBUTRIN XL) 150 MG 24 hr tablet, Take 1 tablet (150 mg total) by mouth daily., Disp: 90 tablet, Rfl: 1   busPIRone (BUSPAR) 15 MG tablet, Take 1 tablet (15 mg total) by mouth 2 (two) times daily., Disp: 180 tablet, Rfl: 1   Calcium Carb-Cholecalciferol (CALCIUM 600+D3 PO), Take 1 tablet by mouth in the morning and at bedtime., Disp: , Rfl:    celecoxib (CELEBREX) 100 MG capsule, Take 1 capsule (100 mg total) by mouth 2 (  two) times daily., Disp: 180 capsule, Rfl: 1   ipratropium-albuterol (DUONEB) 0.5-2.5 (3) MG/3ML SOLN, Take 3 mLs by nebulization every 6 (six) hours as needed., Disp: 360 mL, Rfl: 3   levocetirizine (XYZAL) 5 MG tablet, Take 1 tablet (5 mg total) by mouth every evening., Disp: 90 tablet, Rfl: 1   metFORMIN (GLUCOPHAGE) 500 MG tablet, Take 1 tablet (500 mg total) by mouth 2 (two) times daily with a meal., Disp: 180 tablet, Rfl: 3   montelukast (SINGULAIR) 10 MG tablet, Take 1 tablet (10 mg total) by mouth at bedtime., Disp: 30 tablet, Rfl: 3   Multiple Vitamins-Minerals (ALIVE WOMENS ENERGY PO), Take 1 Dose by mouth daily., Disp: , Rfl:    pregabalin (LYRICA) 25 MG capsule, Take 1 capsule (25 mg total) by mouth 2 (two) times daily., Disp: 60 capsule, Rfl: 2   pregabalin (LYRICA) 25 MG capsule, TAKE 1 CAPSULE BY MOUTH TWICE DAILY, Disp: 60 capsule, Rfl: 1   rosuvastatin (CRESTOR) 5 MG tablet, Take 1 tablet (5 mg total) by mouth daily., Disp: 90 tablet, Rfl: 3   sertraline (ZOLOFT) 100 MG tablet, Take 1.5 tablets (150 mg total) by mouth daily., Disp: 135  tablet, Rfl: 1   SYMBICORT 160-4.5 MCG/ACT inhaler, Inhale 2 puffs into the lungs 2 (two) times daily., Disp: 10.2 g, Rfl: 3   tiotropium (SPIRIVA HANDIHALER) 18 MCG inhalation capsule, Place 1 capsule (18 mcg total) into inhaler and inhale daily., Disp: 30 capsule, Rfl: 3   tiZANidine (ZANAFLEX) 4 MG tablet, Take 1 tablet (4 mg total) by mouth every 6 (six) hours as needed for muscle spasms., Disp: 30 tablet, Rfl: 0   traZODone (DESYREL) 50 MG tablet, Take 1-2 tablets (50-100 mg total) by mouth at bedtime as needed for sleep., Disp: 60 tablet, Rfl: 2   busPIRone (BUSPAR) 5 MG tablet, TAKE 1 TABLET 3 TIMES A DAY (Patient not taking: Reported on 08/29/2020), Disp: 90 tablet, Rfl: 0   Melatonin 1 MG CAPS, Take 1 mg by mouth at bedtime as needed. (Patient not taking: No sig reported), Disp: , Rfl:   No results found.  No images are attached to the encounter.   CMP Latest Ref Rng & Units 03/22/2020  Glucose 65 - 99 mg/dL 86  BUN 6 - 24 mg/dL 11  Creatinine 0.57 - 1.00 mg/dL 0.88  Sodium 134 - 144 mmol/L 138  Potassium 3.5 - 5.2 mmol/L 5.1  Chloride 96 - 106 mmol/L 100  CO2 20 - 29 mmol/L 23  Calcium 8.7 - 10.2 mg/dL 9.6  Total Protein 6.0 - 8.5 g/dL 7.2  Total Bilirubin 0.0 - 1.2 mg/dL 0.3  Alkaline Phos 44 - 121 IU/L 87  AST 0 - 40 IU/L 16  ALT 0 - 32 IU/L 23   CBC Latest Ref Rng & Units 03/22/2020  WBC 3.4 - 10.8 x10E3/uL 8.6  Hemoglobin 11.1 - 15.9 g/dL 14.2  Hematocrit 34.0 - 46.6 % 43.4  Platelets 150 - 450 x10E3/uL 309     Observation/objective: Appears in no acute distress over video visit today.  Breathing is nonlabored  Assessment and plan:Patient is a 55 y.o. 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  She is presently on Arimidex and this is a routine follow-up visit  Recent mammogram from April 2022 showed no evidence of  malignancy.  Patient is tolerating Arimidex and calcium with vitamin D well without  any significant side effects.  She does have low back pain from degenerative joint disease for which she is seeing pain clinic.  Patient also had a bone density scan in April 2022 which showed osteopenia in her left hip with a T score of -1.5 mildly worse as compared to bone density scan 2 years ago but still not bad enough to warrant bisphosphonates at this time.  Continue to monitor  Follow-up instructions: I will see her back in 3 months for an in person breast exam  I discussed the assessment and treatment plan with the patient. The patient was provided an opportunity to ask questions and all were answered. The patient agreed with the plan and demonstrated an understanding of the instructions.   The patient was advised to call back or seek an in-person evaluation if the symptoms worsen or if the condition fails to improve as anticipated.    Visit Diagnosis: 1. Encounter for follow-up surveillance of breast cancer   2. Visit for monitoring Arimidex therapy     Dr. Randa Evens, MD, MPH [facility] at [facility] Tel- [phone] 08/29/2020 4:36 PM

## 2020-09-01 ENCOUNTER — Telehealth: Payer: Self-pay

## 2020-09-01 ENCOUNTER — Ambulatory Visit: Payer: Medicaid Other | Admitting: Physician Assistant

## 2020-09-01 NOTE — Telephone Encounter (Signed)
Left vm to reschedule 09/01/20 missed appointment-Toni

## 2020-09-08 ENCOUNTER — Ambulatory Visit: Payer: Medicaid Other | Admitting: Nurse Practitioner

## 2020-09-08 ENCOUNTER — Encounter: Payer: Self-pay | Admitting: Nurse Practitioner

## 2020-09-08 ENCOUNTER — Other Ambulatory Visit: Payer: Self-pay

## 2020-09-08 VITALS — BP 114/72 | HR 90 | Temp 98.0°F | Resp 16 | Ht 62.0 in | Wt 215.6 lb

## 2020-09-08 DIAGNOSIS — F411 Generalized anxiety disorder: Secondary | ICD-10-CM

## 2020-09-08 DIAGNOSIS — F321 Major depressive disorder, single episode, moderate: Secondary | ICD-10-CM

## 2020-09-08 DIAGNOSIS — F17200 Nicotine dependence, unspecified, uncomplicated: Secondary | ICD-10-CM | POA: Diagnosis not present

## 2020-09-08 DIAGNOSIS — Z9109 Other allergy status, other than to drugs and biological substances: Secondary | ICD-10-CM

## 2020-09-08 DIAGNOSIS — G4709 Other insomnia: Secondary | ICD-10-CM

## 2020-09-08 DIAGNOSIS — Z79899 Other long term (current) drug therapy: Secondary | ICD-10-CM

## 2020-09-08 LAB — POCT URINE DRUG SCREEN
POC Amphetamine UR: NOT DETECTED
POC BENZODIAZEPINES UR: POSITIVE — AB
POC Barbiturate UR: NOT DETECTED
POC Cocaine UR: NOT DETECTED
POC Ecstasy UR: NOT DETECTED
POC Marijuana UR: POSITIVE — AB
POC Methadone UR: NOT DETECTED
POC Methamphetamine UR: NOT DETECTED
POC Opiate Ur: NOT DETECTED
POC Oxycodone UR: NOT DETECTED
POC PHENCYCLIDINE UR: NOT DETECTED
POC TRICYCLICS UR: NOT DETECTED

## 2020-09-08 MED ORDER — TRAZODONE HCL 50 MG PO TABS: 50.0000 mg | ORAL_TABLET | Freq: Every evening | ORAL | 2 refills | Status: DC | PRN

## 2020-09-08 MED ORDER — LEVOCETIRIZINE DIHYDROCHLORIDE 5 MG PO TABS: 5.0000 mg | ORAL_TABLET | Freq: Every evening | ORAL | 1 refills | Status: DC

## 2020-09-08 MED ORDER — BUSPIRONE HCL 30 MG PO TABS: 30.0000 mg | ORAL_TABLET | Freq: Two times a day (BID) | ORAL | 2 refills | Status: DC

## 2020-09-08 NOTE — Progress Notes (Signed)
[facility] [place], [place] [address]  Internal MEDICINE  Office Visit Note  Patient Name: Margaret Blackburn  [id number]  [id number]  Date of Service: 09/08/2020  Chief Complaint  Patient presents with   Follow-up    Refill med   Depression   Anxiety    HPI [name] present for a follow up visit for anxiety, depression and medication refills. At her previous office visit, her alprazolam dose was increased, buspirone was increased and her sertraline dose was increased. She is tolerating those medication changes. She continues to take bupropion to decrease nicotine cravings and help her with smoking cessation. Currently, Margaret Blackburn reports she smokes 6-7 cigarettes per day. She reports that her anxiety has improved a little bit but she is still not under optimal control. She is coping better with her father's death compared to her last office visit. She was having trouble sleeping at her last office visit, but that has improved some too.  [name] is prediabetic and was started on metformin 500 mg twice daily with meals at her last office visit. She reports that she is tolerating this well. She is due to have her A1C checked in about 2 months.     Current Medication: Outpatient Encounter Medications as of 09/08/2020  Medication Sig Note   albuterol (VENTOLIN HFA) 108 (90 Base) MCG/ACT inhaler Inhale 2 puffs into the lungs every 6 (six) hours as needed for wheezing or shortness of breath.    ALPRAZolam (XANAX) 0.5 MG tablet Take 1 tablet (0.5 mg total) by mouth 2 (two) times daily as needed for anxiety.    anastrozole (ARIMIDEX) 1 MG tablet Take 1 tablet (1 mg total) by mouth daily.    aspirin EC 81 MG tablet Take 81 mg by mouth daily. Swallow whole.    azelastine (ASTELIN) 0.1 % nasal spray Place 1 spray into both nostrils 2 (two) times daily. Use in each nostril as directed    buPROPion (WELLBUTRIN XL) 150 MG 24 hr tablet Take 1 tablet (150 mg total) by mouth daily.     busPIRone (BUSPAR) 30 MG tablet Take 1 tablet (30 mg total) by mouth in the morning and at bedtime.    Calcium Carb-Cholecalciferol (CALCIUM 600+D3 PO) Take 1 tablet by mouth in the morning and at bedtime. 08/25/2019: Pt does not know dose of vit d 3   celecoxib (CELEBREX) 100 MG capsule Take 1 capsule (100 mg total) by mouth 2 (two) times daily.    ipratropium-albuterol (DUONEB) 0.5-2.5 (3) MG/3ML SOLN Take 3 mLs by nebulization every 6 (six) hours as needed.    metFORMIN (GLUCOPHAGE) 500 MG tablet Take 1 tablet (500 mg total) by mouth 2 (two) times daily with a meal.    montelukast (SINGULAIR) 10 MG tablet Take 1 tablet (10 mg total) by mouth at bedtime.    Multiple Vitamins-Minerals (ALIVE WOMENS ENERGY PO) Take 1 Dose by mouth daily.    pregabalin (LYRICA) 25 MG capsule Take 1 capsule (25 mg total) by mouth 2 (two) times daily.    pregabalin (LYRICA) 25 MG capsule TAKE 1 CAPSULE BY MOUTH TWICE DAILY    rosuvastatin (CRESTOR) 5 MG tablet Take 1 tablet (5 mg total) by mouth daily.    sertraline (ZOLOFT) 100 MG tablet Take 1.5 tablets (150 mg total) by mouth daily.    SYMBICORT 160-4.5 MCG/ACT inhaler Inhale 2 puffs into the lungs 2 (two) times daily.    tiotropium (SPIRIVA HANDIHALER) 18 MCG inhalation capsule Place 1 capsule (18  mcg total) into inhaler and inhale daily.    tiZANidine (ZANAFLEX) 4 MG tablet Take 1 tablet (4 mg total) by mouth every 6 (six) hours as needed for muscle spasms.    [DISCONTINUED] busPIRone (BUSPAR) 15 MG tablet Take 1 tablet (15 mg total) by mouth 2 (two) times daily.    [DISCONTINUED] levocetirizine (XYZAL) 5 MG tablet Take 1 tablet (5 mg total) by mouth every evening.    [DISCONTINUED] traZODone (DESYREL) 50 MG tablet Take 1-2 tablets (50-100 mg total) by mouth at bedtime as needed for sleep.    levocetirizine (XYZAL) 5 MG tablet Take 1 tablet (5 mg total) by mouth every evening.    Melatonin 1 MG CAPS Take 1 mg by mouth at bedtime as needed. (Patient not taking:  No sig reported)    traZODone (DESYREL) 50 MG tablet Take 1-2 tablets (50-100 mg total) by mouth at bedtime as needed for sleep.    [DISCONTINUED] busPIRone (BUSPAR) 5 MG tablet TAKE 1 TABLET 3 TIMES A DAY (Patient not taking: No sig reported)    No facility-administered encounter medications on file as of 09/08/2020.    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   BREAST LUMPECTOMY WITH NEEDLE LOCALIZATION Right 03/03/2018   Procedure: BREAST LUMPECTOMY WITH NEEDLE LOCALIZATION;  Surgeon: Vickie Epley, MD;  Location: [facility] ORS;  Service: General;  Laterality: Right;   COLONOSCOPY WITH PROPOFOL N/A 08/31/2019   Procedure: COLONOSCOPY WITH PROPOFOL;  Surgeon: Jonathon Bellows, MD;  Location: [facility] ENDOSCOPY;  Service: Gastroenterology;  Laterality: N/A;   SENTINEL NODE BIOPSY Right 03/21/2018   Procedure: RIGHT SENTINEL LYMPH  NODE BIOPSY;  Surgeon: Vickie Epley, MD;  Location: [facility] ORS;  Service: General;  Laterality: Right;    Medical History: Past Medical History:  Diagnosis Date   Allergy    Arthritis    Breast cancer ([place]) 12/2017   right breast   COPD (chronic obstructive pulmonary disease) ([place])    Depression    Personal history of radiation therapy    Prediabetes    Sleep apnea    supposed to get CPAP 03/25/19    Family History: Family History  Problem Relation Age of Onset   Breast cancer Mother 68   Lung cancer Mother    Breast cancer Maternal Grandmother    Thyroid cancer Father     Social History   Socioeconomic History   Marital status: Single    Spouse name: Not on file   Number of children: 2   Years of education: Not on file   Highest education level: Not on file  Occupational History   Not on file  Tobacco Use   Smoking status: Every Day     Packs/day: 2.00    Years: 39.00    Pack years: 78.00    Types: Cigarettes   Smokeless tobacco: Never   Tobacco comments:    in the process of trying to quit-chantix  Vaping Use   Vaping Use: Former   Devices: used  2 weeks per pt  Substance and Sexual Activity   Alcohol use: No   Drug use: Yes    Types: Marijuana    Comment: occ   Sexual activity: Yes  Other Topics Concern   Not on file  Social History Narrative  Not on file   Social Determinants of Health   Financial Resource Strain: Not on file  Food Insecurity: Not on file  Transportation Needs: Not on file  Physical Activity: Not on file  Stress: Not on file  Social Connections: Not on file  Intimate Partner Violence: Not on file      Review of Systems  Constitutional:  Negative for chills, fatigue and unexpected weight change.  HENT:  Negative for congestion, rhinorrhea, sinus pressure, sinus pain, sneezing, sore throat and trouble swallowing.   Eyes:  Negative for redness.  Respiratory:  Negative for cough, chest tightness and shortness of breath.   Cardiovascular:  Negative for chest pain and palpitations.  Gastrointestinal:  Negative for abdominal pain, constipation, diarrhea, nausea and vomiting.  Genitourinary:  Negative for dysuria and frequency.  Musculoskeletal:  Negative for arthralgias, back pain, joint swelling and neck pain.  Skin:  Negative for rash.  Neurological: Negative.  Negative for tremors and numbness.  Hematological:  Negative for adenopathy. Does not bruise/bleed easily.  Psychiatric/Behavioral:  Positive for behavioral problems (Depression) and sleep disturbance. Negative for self-injury and suicidal ideas. The patient is nervous/anxious.    Vital Signs: BP 114/72   Pulse 90   Temp 98 F (36.7 C)   Resp 16   Ht '5\' 2"'$  (1.575 m)   Wt 215 lb 9.6 oz (97.8 kg)   SpO2 97%   BMI 39.43 kg/m    Physical Exam Vitals reviewed.  Constitutional:      Appearance: Normal appearance.   HENT:     Head: Normocephalic and atraumatic.  Cardiovascular:     Rate and Rhythm: Normal rate and regular rhythm.  Pulmonary:     Effort: Pulmonary effort is normal.  Skin:    General: Skin is warm and dry.     Capillary Refill: Capillary refill takes less than 2 seconds.  Neurological:     Mental Status: She is alert and oriented to person, place, and time.  Psychiatric:        Mood and Affect: Mood normal.        Behavior: Behavior normal.     Assessment/Plan: 1. GAD (generalized anxiety disorder) Anxiety not under optimal control, buspirone dose increased. Continue sertraline and alprazolam as prescribed - busPIRone (BUSPAR) 30 MG tablet; Take 1 tablet (30 mg total) by mouth in the morning and at bedtime.  Dispense: 60 tablet; Refill: 2  2. Other insomnia Taking trazodone for insomnia, helping some but still not optimal, will address further at follow up visit in 1 month. Refill ordered.  - traZODone (DESYREL) 50 MG tablet; Take 1-2 tablets (50-100 mg total) by mouth at bedtime as needed for sleep.  Dispense: 60 tablet; Refill: 2  3. Nicotine dependence with current use Continue bupropion as prescribed, she has been making progress in smoking cessation.   4. Depression, major, single episode, moderate (HCC) Stable with current medications, sertraline dose is effective.  5. Environmental allergies Stable with current medications, levocetirizine refill ordered - levocetirizine (XYZAL) 5 MG tablet; Take 1 tablet (5 mg total) by mouth every evening.  Dispense: 90 tablet; Refill: 1  6. Encounter for long-term (current) use of high-risk medication Urine drug screen positive for benzodiazepines which is consistent with current prescriptions.  - POCT Urine Drug Screen   General Counseling: Japneet verbalizes understanding of the findings of todays visit and agrees with plan of treatment. I have discussed any further diagnostic evaluation that may be needed or ordered today. We  also reviewed  her medications today. she has been encouraged to call the office with any questions or concerns that should arise related to todays visit.    Orders Placed This Encounter  Procedures   POCT Urine Drug Screen    Meds ordered this encounter  Medications   traZODone (DESYREL) 50 MG tablet    Sig: Take 1-2 tablets (50-100 mg total) by mouth at bedtime as needed for sleep.    Dispense:  60 tablet    Refill:  2   levocetirizine (XYZAL) 5 MG tablet    Sig: Take 1 tablet (5 mg total) by mouth every evening.    Dispense:  90 tablet    Refill:  1   busPIRone (BUSPAR) 30 MG tablet    Sig: Take 1 tablet (30 mg total) by mouth in the morning and at bedtime.    Dispense:  60 tablet    Refill:  2    Return in about 1 month (around 10/09/2020) for F/U, med refill, Anxiety/depression, Taino Maertens PCP.   Total time spent:30 Minutes Time spent includes review of chart, medications, test results, and follow up plan with the patient.   [place] Controlled Substance Database was reviewed by me.  This patient was seen by Jonetta Osgood, FNP-C in collaboration with Dr. Clayborn Bigness as a part of collaborative care agreement.   Ardythe Klute R. Valetta Fuller, MSN, FNP-C Internal medicine

## 2020-09-20 ENCOUNTER — Telehealth: Payer: Self-pay

## 2020-09-20 ENCOUNTER — Other Ambulatory Visit: Payer: Self-pay | Admitting: Oncology

## 2020-09-20 ENCOUNTER — Other Ambulatory Visit: Payer: Self-pay | Admitting: Internal Medicine

## 2020-09-20 NOTE — Telephone Encounter (Signed)
error 

## 2020-09-26 ENCOUNTER — Telehealth: Payer: Self-pay

## 2020-09-26 NOTE — Telephone Encounter (Signed)
Pt had  appt tomorrow  

## 2020-09-26 NOTE — Telephone Encounter (Signed)
Patient called stating her sinuses are acting up. She is requesting zpack and prednisone to be called into [place]. I sent message to [place]

## 2020-09-27 ENCOUNTER — Telehealth: Payer: Medicaid Other | Admitting: Nurse Practitioner

## 2020-09-27 ENCOUNTER — Other Ambulatory Visit: Payer: Self-pay

## 2020-09-27 ENCOUNTER — Encounter: Payer: Self-pay | Admitting: Nurse Practitioner

## 2020-09-27 VITALS — Temp 97.5°F | Ht 62.0 in | Wt 213.0 lb

## 2020-09-27 DIAGNOSIS — J018 Other acute sinusitis: Secondary | ICD-10-CM | POA: Diagnosis not present

## 2020-09-27 DIAGNOSIS — R059 Cough, unspecified: Secondary | ICD-10-CM | POA: Diagnosis not present

## 2020-09-27 MED ORDER — PREDNISONE 10 MG PO TABS: ORAL_TABLET | ORAL | 0 refills | Status: DC

## 2020-09-27 MED ORDER — AZITHROMYCIN 250 MG PO TABS: ORAL_TABLET | ORAL | 0 refills | Status: DC

## 2020-09-27 MED ORDER — BENZONATATE 100 MG PO CAPS: 100.0000 mg | ORAL_CAPSULE | Freq: Two times a day (BID) | ORAL | 0 refills | Status: DC | PRN

## 2020-09-27 NOTE — Progress Notes (Signed)
[facility] [place], [place] [address]  Internal MEDICINE  Telephone Visit  Patient Name: Margaret Blackburn  [id number]  [id number]  Date of Service: 09/27/2020  I connected with the patient at 11:30 AM by telephone and verified the patients identity using two identifiers.   I discussed the limitations, risks, security and privacy concerns of performing an evaluation and management service by telephone and the availability of in person appointments. I also discussed with the patient that there may be a patient responsible charge related to the service.  The patient expressed understanding and agrees to proceed.    Chief Complaint  Patient presents with   Telephone Assessment    [id number]   Telephone Screen    Covid test is negative yesterday    Sinusitis   Cough    HPI Venola presents for a telehealth virtual visit for cough and symptoms of sinusitis. Her symptoms started 6 days ago. She reports a cough , sinus pressure/pain, ear fullness, ears ringing, nasal congestion, runny nose, sinus drainage, headache. She denies fever, chills, fatigue, and body aches. She tested negative for COVID using the home COVID rapid test.    Current Medication: Outpatient Encounter Medications as of 09/27/2020  Medication Sig Note   albuterol (VENTOLIN HFA) 108 (90 Base) MCG/ACT inhaler Inhale 2 puffs into the lungs every 6 (six) hours as needed for wheezing or shortness of breath.    ALPRAZolam (XANAX) 0.5 MG tablet Take 1 tablet (0.5 mg total) by mouth 2 (two) times daily as needed for anxiety.    anastrozole (ARIMIDEX) 1 MG tablet TAKE 1 TABLET BY MOUTH ONCE A DAY    aspirin EC 81 MG tablet Take 81 mg by mouth daily. Swallow whole.    azelastine (ASTELIN) 0.1 % nasal spray Place 1 spray into both nostrils 2 (two) times daily. Use in each nostril as directed    benzonatate (TESSALON) 100 MG capsule Take 1 capsule (100 mg total) by mouth 2 (two) times daily as needed for  cough.    buPROPion (WELLBUTRIN XL) 150 MG 24 hr tablet Take 1 tablet (150 mg total) by mouth daily.    busPIRone (BUSPAR) 30 MG tablet Take 1 tablet (30 mg total) by mouth in the morning and at bedtime.    Calcium Carb-Cholecalciferol (CALCIUM 600+D3 PO) Take 1 tablet by mouth in the morning and at bedtime. 08/25/2019: Pt does not know dose of vit d 3   celecoxib (CELEBREX) 100 MG capsule Take 1 capsule (100 mg total) by mouth 2 (two) times daily.    ipratropium-albuterol (DUONEB) 0.5-2.5 (3) MG/3ML SOLN Take 3 mLs by nebulization every 6 (six) hours as needed.    levocetirizine (XYZAL) 5 MG tablet Take 1 tablet (5 mg total) by mouth every evening.    metFORMIN (GLUCOPHAGE) 500 MG tablet Take 1 tablet (500 mg total) by mouth 2 (two) times daily with a meal.    montelukast (SINGULAIR) 10 MG tablet Take 1 tablet (10 mg total) by mouth at bedtime.    Multiple Vitamins-Minerals (ALIVE WOMENS ENERGY PO) Take 1 Dose by mouth daily.    pregabalin (LYRICA) 25 MG capsule Take 1 capsule (25 mg total) by mouth 2 (two) times daily.    pregabalin (LYRICA) 25 MG capsule TAKE 1 CAPSULE BY MOUTH TWICE DAILY    rosuvastatin (CRESTOR) 5 MG tablet Take 1 tablet (5 mg total) by mouth daily.    sertraline (ZOLOFT) 100 MG tablet Take 1.5 tablets (150 mg total) by  mouth daily.    SYMBICORT 160-4.5 MCG/ACT inhaler Inhale 2 puffs into the lungs 2 (two) times daily.    tiotropium (SPIRIVA HANDIHALER) 18 MCG inhalation capsule Place 1 capsule (18 mcg total) into inhaler and inhale daily.    tiZANidine (ZANAFLEX) 4 MG tablet Take 1 tablet (4 mg total) by mouth every 6 (six) hours as needed for muscle spasms.    traZODone (DESYREL) 50 MG tablet Take 1-2 tablets (50-100 mg total) by mouth at bedtime as needed for sleep.    [DISCONTINUED] azithromycin (ZITHROMAX) 250 MG tablet Take one tab a day for 10 days for uri    [DISCONTINUED] predniSONE (DELTASONE) 10 MG tablet Take one tab 3 x day for 3 days, then take one tab 2 x a day  for 3 days and then take one tab a day for 3 days for URI    azithromycin (ZITHROMAX) 250 MG tablet Take one tab a day for 10 days for uri    predniSONE (DELTASONE) 10 MG tablet Take one tab 3 x day for 3 days, then take one tab 2 x a day for 3 days and then take one tab a day for 3 days for URI    No facility-administered encounter medications on file as of 09/27/2020.    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   BREAST LUMPECTOMY WITH NEEDLE LOCALIZATION Right 03/03/2018   Procedure: BREAST LUMPECTOMY WITH NEEDLE LOCALIZATION;  Surgeon: Vickie Epley, MD;  Location: [facility] ORS;  Service: General;  Laterality: Right;   COLONOSCOPY WITH PROPOFOL N/A 08/31/2019   Procedure: COLONOSCOPY WITH PROPOFOL;  Surgeon: Jonathon Bellows, MD;  Location: [facility] ENDOSCOPY;  Service: Gastroenterology;  Laterality: N/A;   SENTINEL NODE BIOPSY Right 03/21/2018   Procedure: RIGHT SENTINEL LYMPH  NODE BIOPSY;  Surgeon: Vickie Epley, MD;  Location: [facility] ORS;  Service: General;  Laterality: Right;    Medical History: Past Medical History:  Diagnosis Date   Allergy    Arthritis    Breast cancer ([place]) 12/2017   right breast   COPD (chronic obstructive pulmonary disease) ([place])    Depression    Personal history of radiation therapy    Prediabetes    Sleep apnea    supposed to get CPAP 03/25/19    Family History: Family History  Problem Relation Age of Onset   Breast cancer Mother 32   Lung cancer Mother    Breast cancer Maternal Grandmother    Thyroid cancer Father     Social History   Socioeconomic History   Marital status: Single    Spouse name: Not on file   Number of children: 2   Years of education: Not on file   Highest education level: Not on file  Occupational History   Not on file   Tobacco Use   Smoking status: Every Day    Packs/day: 2.00    Years: 39.00    Pack years: 78.00    Types: Cigarettes, E-cigarettes   Smokeless tobacco: Never   Tobacco comments:    in the process of trying to quit-chantix  Vaping Use   Vaping Use: Former   Devices: used  2 weeks per pt  Substance and Sexual Activity   Alcohol use: No   Drug use: Yes  Types: Marijuana    Comment: occ   Sexual activity: Yes  Other Topics Concern   Not on file  Social History Narrative   Not on file   Social Determinants of Health   Financial Resource Strain: Not on file  Food Insecurity: Not on file  Transportation Needs: Not on file  Physical Activity: Not on file  Stress: Not on file  Social Connections: Not on file  Intimate Partner Violence: Not on file      Review of Systems  Constitutional:  Negative for chills, fatigue and fever.  HENT:  Positive for congestion, ear pain, postnasal drip, rhinorrhea, sinus pressure, sinus pain and tinnitus. Negative for sneezing, sore throat and trouble swallowing.   Respiratory:  Positive for cough.   Gastrointestinal:  Negative for abdominal pain, constipation, diarrhea, nausea and vomiting.  Musculoskeletal:  Negative for arthralgias and myalgias.  Neurological:  Positive for headaches. Negative for facial asymmetry and light-headedness.   Vital Signs: Temp (!) 97.5 F (36.4 C)   Ht '5\' 2"'$  (1.575 m)   Wt 213 lb (96.6 kg)   BMI 38.96 kg/m    Observation/Objective: She is alert and oriented, engages in conversation appropriately. She does not appear to be in any acute distress.     Assessment/Plan: 1. Acute non-recurrent sinusitis of other sinus Empiric treatment of sinusitis and prednisone to decrease inflammation.  - benzonatate (TESSALON) 100 MG capsule; Take 1 capsule (100 mg total) by mouth 2 (two) times daily as needed for cough.  Dispense: 20 capsule; Refill: 0 - azithromycin (ZITHROMAX) 250 MG tablet; Take one tab a day  for 10 days for uri  Dispense: 10 tablet; Refill: 0 - predniSONE (DELTASONE) 10 MG tablet; Take one tab 3 x day for 3 days, then take one tab 2 x a day for 3 days and then take one tab a day for 3 days for URI  Dispense: 18 tablet; Refill: 0  2. Cough Negative home COVID test. Benzonatate prescribed to help with cough.   General Counseling: Chauntel verbalizes understanding of the findings of today's phone visit and agrees with plan of treatment. I have discussed any further diagnostic evaluation that may be needed or ordered today. We also reviewed her medications today. she has been encouraged to call the office with any questions or concerns that should arise related to todays visit.  No follow-ups on file.   No orders of the defined types were placed in this encounter.   Meds ordered this encounter  Medications   DISCONTD: azithromycin (ZITHROMAX) 250 MG tablet    Sig: Take one tab a day for 10 days for uri    Dispense:  10 tablet    Refill:  0   DISCONTD: predniSONE (DELTASONE) 10 MG tablet    Sig: Take one tab 3 x day for 3 days, then take one tab 2 x a day for 3 days and then take one tab a day for 3 days for URI    Dispense:  18 tablet    Refill:  0   benzonatate (TESSALON) 100 MG capsule    Sig: Take 1 capsule (100 mg total) by mouth 2 (two) times daily as needed for cough.    Dispense:  20 capsule    Refill:  0   azithromycin (ZITHROMAX) 250 MG tablet    Sig: Take one tab a day for 10 days for uri    Dispense:  10 tablet    Refill:  0   predniSONE (DELTASONE)  10 MG tablet    Sig: Take one tab 3 x day for 3 days, then take one tab 2 x a day for 3 days and then take one tab a day for 3 days for URI    Dispense:  18 tablet    Refill:  0    Time spent:20 Minutes Time spent with patient included reviewing progress notes, labs, imaging studies, and discussing plan for follow up.  [place] Controlled Substance Database was reviewed by me for overdose risk score (ORS) if  appropriate.  This patient was seen by Jonetta Osgood, FNP-C in collaboration with Dr. Clayborn Bigness as a part of collaborative care agreement.  Paula Zietz R. Valetta Fuller, MSN, FNP-C Internal medicine

## 2020-09-28 ENCOUNTER — Other Ambulatory Visit: Payer: Self-pay | Admitting: Internal Medicine

## 2020-09-28 NOTE — Telephone Encounter (Signed)
Already send rx on 09/08/2020

## 2020-10-06 ENCOUNTER — Other Ambulatory Visit: Payer: Self-pay | Admitting: Internal Medicine

## 2020-10-06 NOTE — Telephone Encounter (Signed)
Please review and refill if appropriate LOV: 09/27/20 NOV: 12/08/20

## 2020-10-07 NOTE — Telephone Encounter (Signed)
Pt will need visit for future refills

## 2020-10-31 ENCOUNTER — Other Ambulatory Visit: Payer: Self-pay | Admitting: Oncology

## 2020-11-08 ENCOUNTER — Ambulatory Visit: Payer: Medicaid Other | Admitting: Oncology

## 2020-11-15 ENCOUNTER — Inpatient Hospital Stay: Payer: Medicaid Other | Attending: Oncology | Admitting: Oncology

## 2020-11-24 ENCOUNTER — Other Ambulatory Visit: Payer: Self-pay | Admitting: Oncology

## 2020-12-07 ENCOUNTER — Other Ambulatory Visit: Payer: Self-pay | Admitting: Internal Medicine

## 2020-12-07 NOTE — Telephone Encounter (Signed)
Pt had appt tomorrow we can fill at visit

## 2020-12-08 ENCOUNTER — Encounter: Payer: Medicaid Other | Admitting: Nurse Practitioner

## 2020-12-09 ENCOUNTER — Other Ambulatory Visit: Payer: Self-pay

## 2020-12-09 ENCOUNTER — Ambulatory Visit: Payer: Medicaid Other | Admitting: Nurse Practitioner

## 2020-12-09 ENCOUNTER — Encounter: Payer: Self-pay | Admitting: Nurse Practitioner

## 2020-12-09 VITALS — BP 102/66 | HR 97 | Temp 98.4°F | Resp 16 | Ht 62.0 in | Wt 212.2 lb

## 2020-12-09 DIAGNOSIS — E559 Vitamin D deficiency, unspecified: Secondary | ICD-10-CM

## 2020-12-09 DIAGNOSIS — I700 Atherosclerosis of aorta: Secondary | ICD-10-CM

## 2020-12-09 DIAGNOSIS — R300 Dysuria: Secondary | ICD-10-CM

## 2020-12-09 DIAGNOSIS — E538 Deficiency of other specified B group vitamins: Secondary | ICD-10-CM | POA: Diagnosis not present

## 2020-12-09 DIAGNOSIS — F411 Generalized anxiety disorder: Secondary | ICD-10-CM

## 2020-12-09 DIAGNOSIS — R7303 Prediabetes: Secondary | ICD-10-CM

## 2020-12-09 DIAGNOSIS — E782 Mixed hyperlipidemia: Secondary | ICD-10-CM

## 2020-12-09 DIAGNOSIS — Z0001 Encounter for general adult medical examination with abnormal findings: Secondary | ICD-10-CM

## 2020-12-09 DIAGNOSIS — F17200 Nicotine dependence, unspecified, uncomplicated: Secondary | ICD-10-CM

## 2020-12-09 MED ORDER — ALPRAZOLAM 0.5 MG PO TABS: 0.5000 mg | ORAL_TABLET | Freq: Three times a day (TID) | ORAL | 2 refills | Status: DC | PRN

## 2020-12-09 NOTE — Progress Notes (Signed)
[facility] [place], [place] [address]  Internal MEDICINE  Office Visit Note  Patient Name: Margaret Blackburn  [id number]  [id number]  Date of Service: 12/09/2020  Chief Complaint  Patient presents with   Annual Exam    Refills, lower back pain and hips have hurt for years, arthritis, off balance, waking up nearly every morning with a headache    Anxiety   Depression    HPI [name] presents for an annual well visit and physical exam.  She has a history of environmental allergies, arthritis, COPD, depression, and sleep apnea. She had carcinoma of the left breast, s/p radiation therapy, and then started on anastrozole in July 09, 2018. Her father died earlier this year, her brother is dying of cirrhosis and everyone in the family moved back home.  [name] has prediabetes and her last A1C was 5.8 in august. She denies any pain. Her pap is due in 2024. She is due for routine labs. Mammogram was done in April this year. She is requesting refill of alprazolam.  She has headaches from time to time, takes OTC medication as needed.  She has trouble sleeping and takes trazodone.    Current Medication: Outpatient Encounter Medications as of 12/09/2020  Medication Sig Note   albuterol (VENTOLIN HFA) 108 (90 Base) MCG/ACT inhaler Inhale 2 puffs into the lungs every 6 (six) hours as needed for wheezing or shortness of breath.    aspirin EC 81 MG tablet Take 81 mg by mouth daily. Swallow whole.    buPROPion (WELLBUTRIN XL) 150 MG 24 hr tablet Take 1 tablet (150 mg total) by mouth daily.    Calcium Carb-Cholecalciferol (CALCIUM 600+D3 PO) Take 1 tablet by mouth in the morning and at bedtime. 08/25/2019: Pt does not know dose of vit d 3   celecoxib (CELEBREX) 100 MG capsule Take 1 capsule (100 mg total) by mouth 2 (two) times daily.    ipratropium-albuterol (DUONEB) 0.5-2.5 (3) MG/3ML SOLN Take 3 mLs by nebulization every 6 (six) hours as needed.    levocetirizine (XYZAL) 5 MG  tablet Take 1 tablet (5 mg total) by mouth every evening.    metFORMIN (GLUCOPHAGE) 500 MG tablet Take 1 tablet (500 mg total) by mouth 2 (two) times daily with a meal.    montelukast (SINGULAIR) 10 MG tablet Take 1 tablet (10 mg total) by mouth at bedtime.    Multiple Vitamins-Minerals (ALIVE WOMENS ENERGY PO) Take 1 Dose by mouth daily.    pregabalin (LYRICA) 25 MG capsule Take 1 capsule (25 mg total) by mouth 2 (two) times daily.    rosuvastatin (CRESTOR) 5 MG tablet Take 1 tablet (5 mg total) by mouth daily.    sertraline (ZOLOFT) 100 MG tablet Take 1.5 tablets (150 mg total) by mouth daily.    SYMBICORT 160-4.5 MCG/ACT inhaler Inhale 2 puffs into the lungs 2 (two) times daily.    tiotropium (SPIRIVA HANDIHALER) 18 MCG inhalation capsule Place 1 capsule (18 mcg total) into inhaler and inhale daily.    traZODone (DESYREL) 50 MG tablet Take 1-2 tablets (50-100 mg total) by mouth at bedtime as needed for sleep.    [DISCONTINUED] ALPRAZolam (XANAX) 0.5 MG tablet TAKE 1 TABLET BY MOUTH TWICE A DAY AS NEEDED FOR ANXIETY    [DISCONTINUED] anastrozole (ARIMIDEX) 1 MG tablet TAKE 1 TABLET BY MOUTH ONCE A DAY    [DISCONTINUED] azelastine (ASTELIN) 0.1 % nasal spray Place 1 spray into both nostrils 2 (two) times daily. Use in each  nostril as directed    [DISCONTINUED] busPIRone (BUSPAR) 30 MG tablet Take 1 tablet (30 mg total) by mouth in the morning and at bedtime.    ALPRAZolam (XANAX) 0.5 MG tablet Take 1 tablet (0.5 mg total) by mouth 3 (three) times daily as needed for anxiety or sleep.    [DISCONTINUED] azithromycin (ZITHROMAX) 250 MG tablet Take one tab a day for 10 days for uri (Patient not taking: Reported on 12/09/2020)    [DISCONTINUED] benzonatate (TESSALON) 100 MG capsule Take 1 capsule (100 mg total) by mouth 2 (two) times daily as needed for cough. (Patient not taking: Reported on 12/09/2020)    [DISCONTINUED] predniSONE (DELTASONE) 10 MG tablet Take one tab 3 x day for 3 days, then take one  tab 2 x a day for 3 days and then take one tab a day for 3 days for URI (Patient not taking: Reported on 12/09/2020)    [DISCONTINUED] pregabalin (LYRICA) 25 MG capsule TAKE 1 CAPSULE BY MOUTH TWICE DAILY (Patient not taking: Reported on 12/09/2020)    [DISCONTINUED] tiZANidine (ZANAFLEX) 4 MG tablet Take 1 tablet (4 mg total) by mouth every 6 (six) hours as needed for muscle spasms. (Patient not taking: Reported on 12/09/2020)    No facility-administered encounter medications on file as of 12/09/2020.    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   BREAST LUMPECTOMY WITH NEEDLE LOCALIZATION Right 03/03/2018   Procedure: BREAST LUMPECTOMY WITH NEEDLE LOCALIZATION;  Surgeon: Vickie Epley, MD;  Location: [facility] ORS;  Service: General;  Laterality: Right;   COLONOSCOPY WITH PROPOFOL N/A 08/31/2019   Procedure: COLONOSCOPY WITH PROPOFOL;  Surgeon: Jonathon Bellows, MD;  Location: [facility] ENDOSCOPY;  Service: Gastroenterology;  Laterality: N/A;   SENTINEL NODE BIOPSY Right 03/21/2018   Procedure: RIGHT SENTINEL LYMPH  NODE BIOPSY;  Surgeon: Vickie Epley, MD;  Location: [facility] ORS;  Service: General;  Laterality: Right;    Medical History: Past Medical History:  Diagnosis Date   Allergy    Arthritis    Breast cancer ([place]) 12/2017   right breast   COPD (chronic obstructive pulmonary disease) ([place])    Depression    Personal history of radiation therapy    Prediabetes    Sleep apnea    supposed to get CPAP 03/25/19    Family History: Family History  Problem Relation Age of Onset   Breast cancer Mother 76   Lung cancer Mother    Breast cancer Maternal Grandmother    Thyroid cancer Father     Social History   Socioeconomic History   Marital status: Single    Spouse name: Not on file    Number of children: 2   Years of education: Not on file   Highest education level: Not on file  Occupational History   Not on file  Tobacco Use   Smoking status: Every Day    Packs/day: 2.00    Years: 39.00    Pack years: 78.00    Types: Cigarettes, E-cigarettes   Smokeless tobacco: Never   Tobacco comments:    in the process of trying to quit-chantix  Vaping Use   Vaping Use: Former   Devices: used  2 weeks per pt  Substance and Sexual Activity   Alcohol use: No  Drug use: Yes    Types: Marijuana    Comment: occ   Sexual activity: Yes  Other Topics Concern   Not on file  Social History Narrative   Not on file   Social Determinants of Health   Financial Resource Strain: Not on file  Food Insecurity: Not on file  Transportation Needs: Not on file  Physical Activity: Not on file  Stress: Not on file  Social Connections: Not on file  Intimate Partner Violence: Not on file      Review of Systems  Constitutional:  Negative for activity change, appetite change, chills, fatigue, fever and unexpected weight change.  HENT: Negative.  Negative for congestion, ear pain, rhinorrhea, sore throat and trouble swallowing.   Eyes: Negative.   Respiratory: Negative.  Negative for cough, chest tightness, shortness of breath and wheezing.   Cardiovascular: Negative.  Negative for chest pain.  Gastrointestinal: Negative.  Negative for abdominal pain, blood in stool, constipation, diarrhea, nausea and vomiting.  Endocrine: Negative.   Genitourinary: Negative.  Negative for difficulty urinating, dysuria, frequency, hematuria and urgency.  Musculoskeletal: Negative.  Negative for arthralgias, back pain, joint swelling, myalgias and neck pain.  Skin: Negative.  Negative for rash and wound.  Allergic/Immunologic: Negative.  Negative for immunocompromised state.  Neurological: Negative.  Negative for dizziness, seizures, numbness and headaches.  Hematological: Negative.    Psychiatric/Behavioral:  Positive for behavioral problems and sleep disturbance. Negative for self-injury and suicidal ideas. The patient is nervous/anxious.        Increased stressors in her life right now with her brother dying, and her father passing away a few months ago.    Vital Signs: BP 102/66   Pulse 97 Comment: 113  Temp 98.4 F (36.9 C)   Resp 16   Ht '5\' 2"'  (1.575 m)   Wt 212 lb 3.2 oz (96.3 kg)   SpO2 97%   BMI 38.81 kg/m    Physical Exam Vitals reviewed.  Constitutional:      General: She is awake. She is not in acute distress.    Appearance: Normal appearance. She is well-developed and well-groomed. She is obese. She is not ill-appearing or diaphoretic.  HENT:     Head: Normocephalic and atraumatic.     Right Ear: Tympanic membrane, ear canal and external ear normal.     Left Ear: Tympanic membrane, ear canal and external ear normal.     Nose: Nose normal. No congestion or rhinorrhea.     Mouth/Throat:     Lips: Pink.     Mouth: Mucous membranes are moist.     Pharynx: Oropharynx is clear. Uvula midline. No oropharyngeal exudate or posterior oropharyngeal erythema.  Eyes:     General: Lids are normal. Vision grossly intact. Gaze aligned appropriately. No scleral icterus.       Right eye: No discharge.        Left eye: No discharge.     Extraocular Movements: Extraocular movements intact.     Conjunctiva/sclera: Conjunctivae normal.     Pupils: Pupils are equal, round, and reactive to light.     Funduscopic exam:    Right eye: Red reflex present.        Left eye: Red reflex present. Neck:     Thyroid: No thyromegaly.     Vascular: No carotid bruit or JVD.     Trachea: Trachea and phonation normal. No tracheal deviation.  Cardiovascular:     Rate and Rhythm: Normal rate and regular rhythm.  Pulses: Normal pulses.     Heart sounds: Normal heart sounds, S1 normal and S2 normal. No murmur heard.   No friction rub. No gallop.  Pulmonary:     Effort:  Pulmonary effort is normal. No accessory muscle usage or respiratory distress.     Breath sounds: Normal breath sounds and air entry. No stridor. No wheezing or rales.  Chest:     Chest wall: No tenderness.     Comments: Declined clinical breast exam, gets annual mammograms.  Abdominal:     General: Bowel sounds are normal. There is no distension.     Palpations: Abdomen is soft. There is no shifting dullness, fluid wave, mass or pulsatile mass.     Tenderness: There is no abdominal tenderness. There is no guarding or rebound.  Musculoskeletal:        General: No tenderness or deformity. Normal range of motion.     Cervical back: Normal range of motion and neck supple.  Lymphadenopathy:     Cervical: No cervical adenopathy.  Skin:    General: Skin is warm and dry.     Capillary Refill: Capillary refill takes less than 2 seconds.     Coloration: Skin is not pale.     Findings: No erythema or rash.  Neurological:     Mental Status: She is alert and oriented to person, place, and time.     Cranial Nerves: No cranial nerve deficit.     Motor: No abnormal muscle tone.     Coordination: Coordination normal.     Gait: Gait normal.     Deep Tendon Reflexes: Reflexes are normal and symmetric.  Psychiatric:        Mood and Affect: Mood and affect normal.        Behavior: Behavior normal. Behavior is cooperative.        Thought Content: Thought content normal.        Judgment: Judgment normal.       Assessment/Plan: 1. Encounter for routine adult health examination with abnormal findings Age-appropriate preventive screenings and vaccinations discussed, annual physical exam completed. Routine labs for health maintenance ordered, see below. PHM updated. Colonoscopy was done in July 2021, mammogram done April 2022, pap is due in 2024.   2. Prediabetes Stable, A1C is 5.8 from august. Repeat a1c in 3 more months. Routine labs ordered.  - CBC with Differential/Platelet - TSH + free T4 -  CMP14+EGFR  3. Aortic atherosclerosis ([place]) taking rosuvastatin, routine labs ordered - CBC with Differential/Platelet - TSH + free T4 - Lipid Profile  4. B12 deficiency Routine lab ordered - B12 and Folate Panel  5. Vitamin D deficiency Routine lab ordered.  - Vitamin D (25 hydroxy)  6. Nicotine dependence with current use Continuing to work on smoking cessation. Does not want any help right now.   7. Dysuria Routine urinalysis done - UA/M w/rflx Culture, Routine - Microscopic Examination - Urine Culture, Reflex  8. GAD (generalized anxiety disorder) Stable, refills ordered - ALPRAZolam (XANAX) 0.5 MG tablet; Take 1 tablet (0.5 mg total) by mouth 3 (three) times daily as needed for anxiety or sleep.  Dispense: 90 tablet; Refill: 2      General Counseling: Mahreen verbalizes understanding of the findings of todays visit and agrees with plan of treatment. I have discussed any further diagnostic evaluation that may be needed or ordered today. We also reviewed her medications today. she has been encouraged to call the office with any questions or concerns that should  arise related to todays visit.    Orders Placed This Encounter  Procedures   Microscopic Examination   Urine Culture, Reflex   UA/M w/rflx Culture, Routine   CBC with Differential/Platelet   Vitamin D (25 hydroxy)   B12 and Folate Panel   TSH + free T4   Lipid Profile   CMP14+EGFR    Meds ordered this encounter  Medications   ALPRAZolam (XANAX) 0.5 MG tablet    Sig: Take 1 tablet (0.5 mg total) by mouth 3 (three) times daily as needed for anxiety or sleep.    Dispense:  90 tablet    Refill:  2    Return in about 3 months (around 03/11/2021) for F/U, anxiety med refill, Freeman Borba PCP.   Total time spent:30 Minutes Time spent includes review of chart, medications, test results, and follow up plan with the patient.   [place] Controlled Substance Database was reviewed by me.  This patient was seen by  Jonetta Osgood, FNP-C in collaboration with Dr. Clayborn Bigness as a part of collaborative care agreement.  Kolbie Clarkston R. Valetta Fuller, MSN, FNP-C Internal medicine

## 2020-12-10 NOTE — Telephone Encounter (Signed)
alresady done at her office visit

## 2020-12-12 ENCOUNTER — Telehealth: Payer: Self-pay

## 2020-12-12 NOTE — Progress Notes (Signed)
Please call patient and let her know the results of the urinalysis, culture not resulted yet.  Urine culture has not resulted yet.  Urinalysis shows 1+ leukocytes (white blood cells) which may mean the body is fighting an infection somewhere in the urinary tract.  The microscopic examination shows epithelial cells and many bacteria which may indicate contamination of the urine specimen which is very common with a mid-stream urine clean catch especially in women. There are also white blood cells.  We will wait to see the results of the culture.

## 2020-12-12 NOTE — Telephone Encounter (Signed)
Patient called with urinalysis done 12/09/20. She is requesting call back-Margaret Blackburn

## 2020-12-13 ENCOUNTER — Encounter: Payer: Medicaid Other | Admitting: Nurse Practitioner

## 2020-12-13 ENCOUNTER — Telehealth: Payer: Self-pay

## 2020-12-13 NOTE — Telephone Encounter (Signed)
Called pt and gave her information on preliminary UA results per Alyssa and advised we will let her know when final results come in from culture

## 2020-12-13 NOTE — Telephone Encounter (Signed)
-----   Message from Jonetta Osgood, NP sent at 12/12/2020  3:52 PM EST ----- Please call patient and let her know the results of the urinalysis, culture not resulted yet.  Urine culture has not resulted yet.  Urinalysis shows 1+ leukocytes (white blood cells) which may mean the body is fighting an infection somewhere in the urinary tract.  The microscopic examination shows epithelial cells and many bacteria which may indicate contamination of the urine specimen which is very common with a mid-stream urine clean catch especially in women. There are also white blood cells.  We will wait to see the results of the culture.

## 2020-12-15 LAB — UA/M W/RFLX CULTURE, ROUTINE
Bilirubin, UA: NEGATIVE
Glucose, UA: NEGATIVE
Ketones, UA: NEGATIVE
Nitrite, UA: NEGATIVE
Protein,UA: NEGATIVE
RBC, UA: NEGATIVE
Specific Gravity, UA: 1.021 (ref 1.005–1.030)
Urobilinogen, Ur: 0.2 mg/dL (ref 0.2–1.0)
pH, UA: 5.5 (ref 5.0–7.5)

## 2020-12-15 LAB — MICROSCOPIC EXAMINATION
Casts: NONE SEEN /lpf
Epithelial Cells (non renal): >10 /hpf — AB (ref 0–10)

## 2020-12-15 LAB — URINE CULTURE, REFLEX

## 2020-12-16 ENCOUNTER — Telehealth: Payer: Self-pay

## 2020-12-19 NOTE — Telephone Encounter (Signed)
Pt advised for urine results is normal no UTI advised to drink lots of water and cranberry juice if not feeling need to been seen

## 2020-12-23 ENCOUNTER — Other Ambulatory Visit: Payer: Self-pay | Admitting: Oncology

## 2020-12-23 ENCOUNTER — Other Ambulatory Visit: Payer: Self-pay | Admitting: Internal Medicine

## 2020-12-23 DIAGNOSIS — F411 Generalized anxiety disorder: Secondary | ICD-10-CM

## 2020-12-26 ENCOUNTER — Encounter: Payer: Self-pay | Admitting: Physician Assistant

## 2020-12-26 ENCOUNTER — Telehealth (INDEPENDENT_AMBULATORY_CARE_PROVIDER_SITE_OTHER): Payer: Medicaid Other | Admitting: Physician Assistant

## 2020-12-26 ENCOUNTER — Other Ambulatory Visit: Payer: Self-pay

## 2020-12-26 VITALS — Ht 62.0 in | Wt 213.0 lb

## 2020-12-26 DIAGNOSIS — Z9109 Other allergy status, other than to drugs and biological substances: Secondary | ICD-10-CM

## 2020-12-26 DIAGNOSIS — J449 Chronic obstructive pulmonary disease, unspecified: Secondary | ICD-10-CM | POA: Diagnosis not present

## 2020-12-26 DIAGNOSIS — J0100 Acute maxillary sinusitis, unspecified: Secondary | ICD-10-CM

## 2020-12-26 MED ORDER — AZITHROMYCIN 250 MG PO TABS: ORAL_TABLET | ORAL | 0 refills | Status: DC

## 2020-12-26 MED ORDER — AZELASTINE HCL 0.1 % NA SOLN: 1.0000 | Freq: Two times a day (BID) | NASAL | 12 refills | Status: DC

## 2020-12-26 NOTE — Progress Notes (Signed)
[facility] [place], [place] [address]  Internal MEDICINE  Telephone Visit  Patient Name: Margaret Blackburn  [id number]  [id number]  Date of Service: 12/26/2020  I connected with the patient at 1:35 by telephone and verified the patients identity using two identifiers.   I discussed the limitations, risks, security and privacy concerns of performing an evaluation and management service by telephone and the availability of in person appointments. I also discussed with the patient that there may be a patient responsible charge related to the service.  The patient expressed understanding and agrees to proceed.    Chief Complaint  Patient presents with   Telephone Screen    [id number]   Telephone Assessment    Covid test is negative   Sinusitis   Cough    HPI Pt is here for a virtual sick visit -She has been experiencing sinus congestion, both ears muffled, sore throat, headaches, cough with some SOB and wheezing. -Denies fever, chills, or bodyaches. Covid test negative. -Taking inhalers and nasal spray -Has not tried mucinex but will start now -Reports tessalon pearls usually help but aren't covered by insurance so will try OTC cough syrup instead  Current Medication: Outpatient Encounter Medications as of 12/26/2020  Medication Sig Note   albuterol (VENTOLIN HFA) 108 (90 Base) MCG/ACT inhaler Inhale 2 puffs into the lungs every 6 (six) hours as needed for wheezing or shortness of breath.    ALPRAZolam (XANAX) 0.5 MG tablet Take 1 tablet (0.5 mg total) by mouth 3 (three) times daily as needed for anxiety or sleep.    anastrozole (ARIMIDEX) 1 MG tablet TAKE 1 TABLET BY MOUTH ONCE A DAY    aspirin EC 81 MG tablet Take 81 mg by mouth daily. Swallow whole.    azithromycin (ZITHROMAX) 250 MG tablet Take one tab a day for 10 days for uri    buPROPion (WELLBUTRIN XL) 150 MG 24 hr tablet Take 1 tablet (150 mg total) by mouth daily.    busPIRone (BUSPAR) 30 MG  tablet TAKE 1 TABLET BY MOUTH IN THE MORNING AND TAKE 1 TABLET AT BEDTIME    Calcium Carb-Cholecalciferol (CALCIUM 600+D3 PO) Take 1 tablet by mouth in the morning and at bedtime. 08/25/2019: Pt does not know dose of vit d 3   celecoxib (CELEBREX) 100 MG capsule Take 1 capsule (100 mg total) by mouth 2 (two) times daily.    ipratropium-albuterol (DUONEB) 0.5-2.5 (3) MG/3ML SOLN Take 3 mLs by nebulization every 6 (six) hours as needed.    levocetirizine (XYZAL) 5 MG tablet Take 1 tablet (5 mg total) by mouth every evening.    metFORMIN (GLUCOPHAGE) 500 MG tablet Take 1 tablet (500 mg total) by mouth 2 (two) times daily with a meal.    montelukast (SINGULAIR) 10 MG tablet Take 1 tablet (10 mg total) by mouth at bedtime.    Multiple Vitamins-Minerals (ALIVE WOMENS ENERGY PO) Take 1 Dose by mouth daily.    pregabalin (LYRICA) 25 MG capsule Take 1 capsule (25 mg total) by mouth 2 (two) times daily.    rosuvastatin (CRESTOR) 5 MG tablet Take 1 tablet (5 mg total) by mouth daily.    sertraline (ZOLOFT) 100 MG tablet Take 1.5 tablets (150 mg total) by mouth daily.    SYMBICORT 160-4.5 MCG/ACT inhaler Inhale 2 puffs into the lungs 2 (two) times daily.    tiotropium (SPIRIVA HANDIHALER) 18 MCG inhalation capsule Place 1 capsule (18 mcg total) into inhaler and inhale daily.  traZODone (DESYREL) 50 MG tablet Take 1-2 tablets (50-100 mg total) by mouth at bedtime as needed for sleep.    [DISCONTINUED] azelastine (ASTELIN) 0.1 % nasal spray Place 1 spray into both nostrils 2 (two) times daily. Use in each nostril as directed    azelastine (ASTELIN) 0.1 % nasal spray Place 1 spray into both nostrils 2 (two) times daily. Use in each nostril as directed    No facility-administered encounter medications on file as of 12/26/2020.    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   BREAST LUMPECTOMY WITH NEEDLE LOCALIZATION Right 03/03/2018   Procedure: BREAST LUMPECTOMY WITH NEEDLE LOCALIZATION;  Surgeon: Vickie Epley, MD;  Location: [facility] ORS;  Service: General;  Laterality: Right;   COLONOSCOPY WITH PROPOFOL N/A 08/31/2019   Procedure: COLONOSCOPY WITH PROPOFOL;  Surgeon: Jonathon Bellows, MD;  Location: [facility] ENDOSCOPY;  Service: Gastroenterology;  Laterality: N/A;   SENTINEL NODE BIOPSY Right 03/21/2018   Procedure: RIGHT SENTINEL LYMPH  NODE BIOPSY;  Surgeon: Vickie Epley, MD;  Location: [facility] ORS;  Service: General;  Laterality: Right;    Medical History: Past Medical History:  Diagnosis Date   Allergy    Arthritis    Breast cancer ([place]) 12/2017   right breast   COPD (chronic obstructive pulmonary disease) ([place])    Depression    Personal history of radiation therapy    Prediabetes    Sleep apnea    supposed to get CPAP 03/25/19    Family History: Family History  Problem Relation Age of Onset   Breast cancer Mother 63   Lung cancer Mother    Breast cancer Maternal Grandmother    Thyroid cancer Father     Social History   Socioeconomic History   Marital status: Single    Spouse name: Not on file   Number of children: 2   Years of education: Not on file   Highest education level: Not on file  Occupational History   Not on file  Tobacco Use   Smoking status: Every Day    Packs/day: 2.00    Years: 39.00    Pack years: 78.00    Types: Cigarettes, E-cigarettes   Smokeless tobacco: Never   Tobacco comments:    in the process of trying to quit-chantix  Vaping Use   Vaping Use: Former   Devices: used  2 weeks per pt  Substance and Sexual Activity   Alcohol use: No   Drug use: Yes    Types: Marijuana    Comment: occ   Sexual activity: Yes  Other Topics Concern   Not on file  Social History Narrative   Not on file   Social Determinants of Health   Financial  Resource Strain: Not on file  Food Insecurity: Not on file  Transportation Needs: Not on file  Physical Activity: Not on file  Stress: Not on file  Social Connections: Not on file  Intimate Partner Violence: Not on file      Review of Systems  Constitutional:  Negative for fatigue and fever.  HENT:  Positive for congestion, ear pain, postnasal drip, sinus pressure, sinus pain and sore throat. Negative for mouth sores.   Respiratory:  Positive for cough, shortness of breath and wheezing.   Cardiovascular:  Negative for chest pain.  Genitourinary:  Negative for flank pain.  Musculoskeletal:  Negative for myalgias.  Neurological:  Positive for headaches.  Psychiatric/Behavioral: Negative.     Vital Signs: Ht 5\' 2"  (1.575 m)   Wt 213 lb (96.6 kg)   BMI 38.96 kg/m    Observation/Objective:  Pt is able to carry out conversation  Assessment/Plan: 1. Acute non-recurrent maxillary sinusitis Will start zpak and mucinex and continue nasal spray. May also take cough syrup as needed for cough - azithromycin (ZITHROMAX) 250 MG tablet; Take one tab a day for 10 days for uri  Dispense: 10 tablet; Refill: 0  2. Chronic obstructive pulmonary disease, unspecified COPD type ([place]) Continue inhalers as prescribed and as indicated and start on zpak. - azithromycin (ZITHROMAX) 250 MG tablet; Take one tab a day for 10 days for uri  Dispense: 10 tablet; Refill: 0  3. Environmental allergies - azelastine (ASTELIN) 0.1 % nasal spray; Place 1 spray into both nostrils 2 (two) times daily. Use in each nostril as directed  Dispense: 30 mL; Refill: 12   General Counseling: Khalia verbalizes understanding of the findings of today's phone visit and agrees with plan of treatment. I have discussed any further diagnostic evaluation that may be needed or ordered today. We also reviewed her medications today. she has been encouraged to call the office with any questions or concerns that should arise related to  todays visit.    No orders of the defined types were placed in this encounter.   Meds ordered this encounter  Medications   azithromycin (ZITHROMAX) 250 MG tablet    Sig: Take one tab a day for 10 days for uri    Dispense:  10 tablet    Refill:  0   azelastine (ASTELIN) 0.1 % nasal spray    Sig: Place 1 spray into both nostrils 2 (two) times daily. Use in each nostril as directed    Dispense:  30 mL    Refill:  12    Time spent:25 Minutes    Dr Lavera Guise Internal medicine

## 2021-01-09 ENCOUNTER — Encounter: Payer: Self-pay | Admitting: Nurse Practitioner

## 2021-01-24 ENCOUNTER — Other Ambulatory Visit: Payer: Self-pay | Admitting: Oncology

## 2021-02-23 ENCOUNTER — Other Ambulatory Visit: Payer: Self-pay | Admitting: Internal Medicine

## 2021-02-23 ENCOUNTER — Other Ambulatory Visit: Payer: Self-pay | Admitting: Oncology

## 2021-02-23 DIAGNOSIS — F17200 Nicotine dependence, unspecified, uncomplicated: Secondary | ICD-10-CM

## 2021-02-23 DIAGNOSIS — F411 Generalized anxiety disorder: Secondary | ICD-10-CM

## 2021-02-23 DIAGNOSIS — G8929 Other chronic pain: Secondary | ICD-10-CM

## 2021-02-23 DIAGNOSIS — M5441 Lumbago with sciatica, right side: Secondary | ICD-10-CM

## 2021-02-23 DIAGNOSIS — F321 Major depressive disorder, single episode, moderate: Secondary | ICD-10-CM

## 2021-02-28 NOTE — Telephone Encounter (Signed)
Med sent to pharmacy, additional refills we be sent at next office visit

## 2021-03-10 ENCOUNTER — Ambulatory Visit: Payer: Medicaid Other | Admitting: Nurse Practitioner

## 2021-03-14 ENCOUNTER — Ambulatory Visit: Payer: Medicaid Other | Admitting: Nurse Practitioner

## 2021-03-14 ENCOUNTER — Encounter: Payer: Self-pay | Admitting: Nurse Practitioner

## 2021-03-14 ENCOUNTER — Other Ambulatory Visit: Payer: Self-pay

## 2021-03-14 VITALS — BP 100/70 | HR 100 | Temp 98.3°F | Resp 16 | Ht 62.0 in | Wt 216.2 lb

## 2021-03-14 DIAGNOSIS — F17200 Nicotine dependence, unspecified, uncomplicated: Secondary | ICD-10-CM | POA: Diagnosis not present

## 2021-03-14 DIAGNOSIS — G4709 Other insomnia: Secondary | ICD-10-CM | POA: Diagnosis not present

## 2021-03-14 DIAGNOSIS — F321 Major depressive disorder, single episode, moderate: Secondary | ICD-10-CM

## 2021-03-14 DIAGNOSIS — M549 Dorsalgia, unspecified: Secondary | ICD-10-CM | POA: Diagnosis not present

## 2021-03-14 DIAGNOSIS — M47816 Spondylosis without myelopathy or radiculopathy, lumbar region: Secondary | ICD-10-CM

## 2021-03-14 DIAGNOSIS — F411 Generalized anxiety disorder: Secondary | ICD-10-CM

## 2021-03-14 MED ORDER — PREGABALIN 75 MG PO CAPS: 75.0000 mg | ORAL_CAPSULE | Freq: Two times a day (BID) | ORAL | 2 refills | Status: DC

## 2021-03-14 MED ORDER — BUPROPION HCL ER (XL) 300 MG PO TB24: 300.0000 mg | ORAL_TABLET | Freq: Every day | ORAL | 2 refills | Status: DC

## 2021-03-14 MED ORDER — ALPRAZOLAM 0.5 MG PO TABS: 0.5000 mg | ORAL_TABLET | Freq: Three times a day (TID) | ORAL | 2 refills | Status: DC | PRN

## 2021-03-14 MED ORDER — BUSPIRONE HCL 30 MG PO TABS: 30.0000 mg | ORAL_TABLET | Freq: Two times a day (BID) | ORAL | 2 refills | Status: DC

## 2021-03-14 MED ORDER — HYDROCODONE-ACETAMINOPHEN 5-325 MG PO TABS: 1.0000 | ORAL_TABLET | Freq: Four times a day (QID) | ORAL | 0 refills | Status: DC | PRN

## 2021-03-14 MED ORDER — TRAZODONE HCL 50 MG PO TABS: 50.0000 mg | ORAL_TABLET | Freq: Every evening | ORAL | 2 refills | Status: DC | PRN

## 2021-03-14 NOTE — Progress Notes (Signed)
Margaret Blackburn [place], Margaret Blackburn [id number]  Internal MEDICINE  Office Visit Note  Patient Name: Margaret Blackburn  [id number]  [id number]  Date of Service: 03/14/2021  Chief Complaint  Patient presents with   Follow-up    Upper back pain when reaching for anything, started about a month ago, continuous lower back pain for years, right foot   Medication Refill    HPI [name] presents for a follow-up visit for upper back pain and medication refills.  She reports that she experiences upper back pain when reaching for anything especially above her head.  This pain started approximately a month ago.  She has had continuous lower back pain for years and her right foot is also bothering her.  She has tried Celebrex and meloxicam with no relief.  She has tried over-the-counter pain medications and they have also not helped. She is also interested in increasing her bupropion dose to see if this will decrease her cravings for nicotine further. She is in need of several medication refills as well   Current Medication: Outpatient Encounter Medications as of 03/14/2021  Medication Sig Note   albuterol (VENTOLIN HFA) 108 (90 Base) MCG/ACT inhaler Inhale 2 puffs into the lungs every 6 (six) hours as needed for wheezing or shortness of breath.    anastrozole (ARIMIDEX) 1 MG tablet TAKE 1 TABLET BY MOUTH ONCE A DAY    aspirin EC 81 MG tablet Take 81 mg by mouth daily. Swallow whole.    azelastine (ASTELIN) 0.1 % nasal spray Place 1 spray into both nostrils 2 (two) times daily. Use in each nostril as directed    Calcium Carb-Cholecalciferol (CALCIUM 600+D3 PO) Take 1 tablet by mouth in the morning and at bedtime. 08/25/2019: Pt does not know dose of vit d 3   celecoxib (CELEBREX) 100 MG capsule Take 1 capsule (100 mg total) by mouth 2 (two) times daily.    HYDROcodone-acetaminophen (NORCO) 5-325 MG tablet Take 1 tablet by mouth every 6 (six) hours as needed for severe pain.     ipratropium-albuterol (DUONEB) 0.5-2.5 (3) MG/3ML SOLN Take 3 mLs by nebulization every 6 (six) hours as needed.    levocetirizine (XYZAL) 5 MG tablet Take 1 tablet (5 mg total) by mouth every evening.    metFORMIN (GLUCOPHAGE) 500 MG tablet Take 1 tablet (500 mg total) by mouth 2 (two) times daily with a meal.    Multiple Vitamins-Minerals (ALIVE WOMENS ENERGY PO) Take 1 Dose by mouth daily.    pregabalin (LYRICA) 75 MG capsule Take 1 capsule (75 mg total) by mouth 2 (two) times daily.    rosuvastatin (CRESTOR) 5 MG tablet Take 1 tablet (5 mg total) by mouth daily.    sertraline (ZOLOFT) 100 MG tablet TAKE 1 AND 1/2 TABLETS BY MOUTH DAILY    tiotropium (SPIRIVA HANDIHALER) 18 MCG inhalation capsule Place 1 capsule (18 mcg total) into inhaler and inhale daily.    [DISCONTINUED] ALPRAZolam (XANAX) 0.5 MG tablet Take 1 tablet (0.5 mg total) by mouth 3 (three) times daily as needed for anxiety or sleep.    [DISCONTINUED] buPROPion (WELLBUTRIN XL) 150 MG 24 hr tablet TAKE 1 TABLET BY MOUTH ONCE A DAY    [DISCONTINUED] buPROPion (WELLBUTRIN XL) 300 MG 24 hr tablet Take 1 tablet (300 mg total) by mouth daily.    [DISCONTINUED] busPIRone (BUSPAR) 30 MG tablet TAKE 1 TABLET BY MOUTH IN THE MORNING AND TAKE 1 TABLET AT BEDTIME    [DISCONTINUED] montelukast (SINGULAIR) 10  MG tablet Take 1 tablet (10 mg total) by mouth at bedtime.    [DISCONTINUED] pregabalin (LYRICA) 25 MG capsule TAKE 1 CAPSULE BY MOUTH TWICE DAILY    [DISCONTINUED] SYMBICORT 160-4.5 MCG/ACT inhaler Inhale 2 puffs into the lungs 2 (two) times daily.    [DISCONTINUED] traZODone (DESYREL) 50 MG tablet Take 1-2 tablets (50-100 mg total) by mouth at bedtime as needed for sleep.    ALPRAZolam (XANAX) 0.5 MG tablet Take 1 tablet (0.5 mg total) by mouth 3 (three) times daily as needed for anxiety or sleep.    busPIRone (BUSPAR) 30 MG tablet Take 1 tablet (30 mg total) by mouth 2 (two) times daily.    traZODone (DESYREL) 50 MG tablet Take 1-2  tablets (50-100 mg total) by mouth at bedtime as needed for sleep.    [DISCONTINUED] azithromycin (ZITHROMAX) 250 MG tablet Take one tab a day for 10 days for uri (Patient not taking: Reported on 03/14/2021)    No facility-administered encounter medications on file as of 03/14/2021.    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   BREAST LUMPECTOMY WITH NEEDLE LOCALIZATION Right 03/03/2018   Procedure: BREAST LUMPECTOMY WITH NEEDLE LOCALIZATION;  Surgeon: Vickie Epley, MD;  Location: [facility] ORS;  Service: General;  Laterality: Right;   COLONOSCOPY WITH PROPOFOL N/A 08/31/2019   Procedure: COLONOSCOPY WITH PROPOFOL;  Surgeon: Jonathon Bellows, MD;  [facility];  Service: Gastroenterology;  Laterality: N/A;   SENTINEL NODE BIOPSY Right 03/21/2018   Procedure: RIGHT SENTINEL LYMPH  NODE BIOPSY;  Surgeon: Vickie Epley, MD;  Location: [facility] ORS;  Service: General;  Laterality: Right;    Medical History: Past Medical History:  Diagnosis Date   Allergy    Arthritis    Breast cancer ([place]) 12/2017   right breast   COPD (chronic obstructive pulmonary disease) ([place])    Depression    Personal history of radiation therapy    Prediabetes    Sleep apnea    supposed to get CPAP 03/25/19    Family History: Family History  Problem Relation Age of Onset   Breast cancer Mother 22   Lung cancer Mother    Breast cancer Maternal Grandmother    Thyroid cancer Father     Social History   Socioeconomic History   Marital status: Single    Spouse name: Not on file   Number of children: 2   Years of education: Not on file   Highest education level: Not on file  Occupational History   Not on file  Tobacco Use   Smoking status: Every Day    Packs/day: 2.00    Years: 39.00    Pack  years: 78.00    Types: Cigarettes, E-cigarettes   Smokeless tobacco: Never   Tobacco comments:    in the process of trying to quit-chantix  Vaping Use   Vaping Use: Former   Devices: used  2 weeks per pt  Substance and Sexual Activity   Alcohol use: No   Drug use: Yes    Types: Marijuana    Comment: occ   Sexual activity: Yes  Other Topics Concern   Not on file  Social History Narrative   Not on file   Social Determinants of Health   Financial Resource Strain:  Not on file  Food Insecurity: Not on file  Transportation Needs: Not on file  Physical Activity: Not on file  Stress: Not on file  Social Connections: Not on file  Intimate Partner Violence: Not on file      Review of Systems  Constitutional:  Negative for chills, fatigue and unexpected weight change.  HENT:  Negative for congestion, rhinorrhea, sneezing and sore throat.   Eyes:  Negative for redness.  Respiratory:  Negative for cough, chest tightness and shortness of breath.   Cardiovascular: Negative.  Negative for chest pain and palpitations.  Gastrointestinal: Negative.  Negative for abdominal pain, constipation, diarrhea, nausea and vomiting.  Genitourinary:  Negative for dysuria and frequency.  Musculoskeletal: Negative.  Negative for arthralgias, back pain, joint swelling and neck pain.  Skin:  Negative for rash.  Neurological: Negative.  Negative for tremors and numbness.  Hematological:  Negative for adenopathy. Does not bruise/bleed easily.  Psychiatric/Behavioral:  Positive for behavioral problems (Depression) and sleep disturbance. Negative for self-injury and suicidal ideas. The patient is nervous/anxious.    Vital Signs: BP 100/70    Pulse 100    Temp 98.3 F (36.8 C)    Resp 16    Ht 5\' 2"  (1.575 m)    Wt 216 lb 3.2 oz (98.1 kg)    SpO2 97%    BMI 39.54 kg/m    Physical Exam Vitals reviewed.  Constitutional:      General: She is not in acute distress.    Appearance: Normal appearance. She  is obese. She is not ill-appearing.  HENT:     Head: Normocephalic and atraumatic.  Eyes:     Pupils: Pupils are equal, round, and reactive to light.  Cardiovascular:     Rate and Rhythm: Normal rate and regular rhythm.  Pulmonary:     Effort: Pulmonary effort is normal. No respiratory distress.  Neurological:     Mental Status: She is alert and oriented to person, place, and time.  Psychiatric:        Mood and Affect: Mood normal.        Behavior: Behavior normal.       Assessment/Plan: 1. Acute upper back pain New upper back pain, short term prescription for norco ordered, xray of upper back ordered to evaluate for acute changes. Patient encouraged to apply ice and/heat alternating and do light stretches as she feels better. Encouraged patient to rest and not do activities that aggravate her upper back pain. Will consider ortho referral if pain is not relieved or gets worse and pending on what her xray shows.  - DG Thoracic Spine W/Swimmers; Future - HYDROcodone-acetaminophen (NORCO) 5-325 MG tablet; Take 1 tablet by mouth every 6 (six) hours as needed for severe pain.  Dispense: 20 tablet; Refill: 0  2. Lumbar facet arthropathy Xray of back ordered to evaluate for any acute or degenerative changes.  - DG Lumbar Spine Complete; Future - pregabalin (LYRICA) 75 MG capsule; Take 1 capsule (75 mg total) by mouth 2 (two) times daily.  Dispense: 60 capsule; Refill: 2  3. Other insomnia Takes trazodone for sleep. Taking lyrica for nerve pain but is on low dose, dose increased today.  - pregabalin (LYRICA) 75 MG capsule; Take 1 capsule (75 mg total) by mouth 2 (two) times daily.  Dispense: 60 capsule; Refill: 2 - traZODone (DESYREL) 50 MG tablet; Take 1-2 tablets (50-100 mg total) by mouth at bedtime as needed for sleep.  Dispense: 60 tablet; Refill: 2  4. Nicotine  dependence with current use Bupropion dose increased.   5. Depression, major, single episode, moderate ([place]) Patient is  currently on sertraline and bupropion and trazodone.   6. GAD (generalized anxiety disorder) Patient has increased anxiety. Taking alprazolam and buspirone as prescribed. Refills ordered.  - ALPRAZolam (XANAX) 0.5 MG tablet; Take 1 tablet (0.5 mg total) by mouth 3 (three) times daily as needed for anxiety or sleep.  Dispense: 90 tablet; Refill: 2 - busPIRone (BUSPAR) 30 MG tablet; Take 1 tablet (30 mg total) by mouth 2 (two) times daily.  Dispense: 60 tablet; Refill: 2   General Counseling: [place] verbalizes understanding of the findings of todays visit and agrees with plan of treatment. I have discussed any further diagnostic evaluation that may be needed or ordered today. We also reviewed her medications today. she has been encouraged to call the office with any questions or concerns that should arise related to todays visit.    Orders Placed This Encounter  Procedures   DG Thoracic Spine W/Swimmers   DG Lumbar Spine Complete    Meds ordered this encounter  Medications   ALPRAZolam (XANAX) 0.5 MG tablet    Sig: Take 1 tablet (0.5 mg total) by mouth 3 (three) times daily as needed for anxiety or sleep.    Dispense:  90 tablet    Refill:  2   pregabalin (LYRICA) 75 MG capsule    Sig: Take 1 capsule (75 mg total) by mouth 2 (two) times daily.    Dispense:  60 capsule    Refill:  2    Note increased dose,please discontinue 25 mg capsule. Please fill prescription today, thanks   busPIRone (BUSPAR) 30 MG tablet    Sig: Take 1 tablet (30 mg total) by mouth 2 (two) times daily.    Dispense:  60 tablet    Refill:  2   DISCONTD: buPROPion (WELLBUTRIN XL) 300 MG 24 hr tablet    Sig: Take 1 tablet (300 mg total) by mouth daily.    Dispense:  30 tablet    Refill:  2    Please note increased dose, discontinue 150 mg tablet. Please fill new prescription today   HYDROcodone-acetaminophen (NORCO) 5-325 MG tablet    Sig: Take 1 tablet by mouth every 6 (six) hours as needed for severe pain.     Dispense:  20 tablet    Refill:  0   traZODone (DESYREL) 50 MG tablet    Sig: Take 1-2 tablets (50-100 mg total) by mouth at bedtime as needed for sleep.    Dispense:  60 tablet    Refill:  2    Return in about 1 month (around 04/11/2021) for F/U med dose adjustments, Hanan Moen PCP.   Total time spent:30 Minutes Time spent includes review of chart, medications, test results, and follow up plan with the patient.   [place] Controlled Substance Database was reviewed by me.  This patient was seen by Jonetta Osgood, FNP-C in collaboration with Dr. Clayborn Bigness as a part of collaborative care agreement.   Simone Rodenbeck R. Valetta Fuller, MSN, FNP-C Internal medicine

## 2021-03-21 ENCOUNTER — Ambulatory Visit: Payer: Medicaid Other | Admitting: Nurse Practitioner

## 2021-03-21 ENCOUNTER — Telehealth: Payer: Self-pay

## 2021-03-23 ENCOUNTER — Other Ambulatory Visit: Payer: Self-pay | Admitting: Nurse Practitioner

## 2021-03-24 ENCOUNTER — Telehealth: Payer: Self-pay

## 2021-03-24 NOTE — Telephone Encounter (Signed)
Spoke with pt that as per alyssa she already Alprazolam 0.5 3 times daily prn ,Zoloft 150 mg daily,Buspirone 30 mg twice daily,Pregabalin 75 mg twice daily ,Trazodone 100mg  at bedtime and she  cannot add any new medication. I can decrease her bupropion back to 150 mg daily or I can refer her to psychiatry for further evaluation pt said she like to hold off on Wellbutrin and referral until she had appt with alyssa and also send message to [facility]

## 2021-03-24 NOTE — Telephone Encounter (Signed)
Just FYI.

## 2021-03-30 ENCOUNTER — Other Ambulatory Visit: Payer: Self-pay | Admitting: Internal Medicine

## 2021-03-30 ENCOUNTER — Other Ambulatory Visit: Payer: Self-pay

## 2021-03-30 DIAGNOSIS — J449 Chronic obstructive pulmonary disease, unspecified: Secondary | ICD-10-CM

## 2021-03-30 DIAGNOSIS — Z9109 Other allergy status, other than to drugs and biological substances: Secondary | ICD-10-CM

## 2021-03-30 MED ORDER — SYMBICORT 160-4.5 MCG/ACT IN AERO: 2.0000 | INHALATION_SPRAY | Freq: Two times a day (BID) | RESPIRATORY_TRACT | 3 refills | Status: DC

## 2021-04-09 ENCOUNTER — Encounter: Payer: Self-pay | Admitting: Nurse Practitioner

## 2021-04-11 ENCOUNTER — Ambulatory Visit: Payer: Medicaid Other | Admitting: Nurse Practitioner

## 2021-04-14 ENCOUNTER — Other Ambulatory Visit: Payer: Self-pay

## 2021-04-14 ENCOUNTER — Ambulatory Visit: Payer: Medicaid Other | Admitting: Nurse Practitioner

## 2021-04-14 ENCOUNTER — Encounter: Payer: Self-pay | Admitting: Nurse Practitioner

## 2021-04-14 VITALS — BP 116/72 | HR 90 | Temp 98.7°F | Resp 16 | Ht 62.0 in | Wt 213.0 lb

## 2021-04-14 DIAGNOSIS — Z9109 Other allergy status, other than to drugs and biological substances: Secondary | ICD-10-CM | POA: Diagnosis not present

## 2021-04-14 DIAGNOSIS — M549 Dorsalgia, unspecified: Secondary | ICD-10-CM

## 2021-04-14 DIAGNOSIS — J449 Chronic obstructive pulmonary disease, unspecified: Secondary | ICD-10-CM

## 2021-04-14 DIAGNOSIS — F321 Major depressive disorder, single episode, moderate: Secondary | ICD-10-CM

## 2021-04-14 DIAGNOSIS — F411 Generalized anxiety disorder: Secondary | ICD-10-CM

## 2021-04-14 MED ORDER — HYDROCODONE-ACETAMINOPHEN 5-325 MG PO TABS: 1.0000 | ORAL_TABLET | Freq: Four times a day (QID) | ORAL | 0 refills | Status: DC | PRN

## 2021-04-14 MED ORDER — ALBUTEROL SULFATE HFA 108 (90 BASE) MCG/ACT IN AERS: 2.0000 | INHALATION_SPRAY | Freq: Four times a day (QID) | RESPIRATORY_TRACT | 5 refills | Status: DC | PRN

## 2021-04-14 MED ORDER — ALPRAZOLAM 0.5 MG PO TABS: 0.5000 mg | ORAL_TABLET | Freq: Three times a day (TID) | ORAL | 1 refills | Status: DC | PRN

## 2021-04-14 MED ORDER — AZELASTINE HCL 0.1 % NA SOLN: 1.0000 | Freq: Two times a day (BID) | NASAL | 12 refills | Status: DC

## 2021-04-14 MED ORDER — TRELEGY ELLIPTA 100-62.5-25 MCG/ACT IN AEPB: 1.0000 | INHALATION_SPRAY | Freq: Every day | RESPIRATORY_TRACT | 11 refills | Status: DC

## 2021-04-14 MED ORDER — SERTRALINE HCL 100 MG PO TABS: 150.0000 mg | ORAL_TABLET | Freq: Every day | ORAL | 1 refills | Status: DC

## 2021-04-14 NOTE — Progress Notes (Signed)
[facility] [place], [place] [address]  Internal MEDICINE  Office Visit Note  Patient Name: Margaret Blackburn  [id number]  [id number]  Date of Service: 04/14/2021  Chief Complaint  Patient presents with   Follow-up    Discuss meds   Depression   COPD    HPI Margaret Blackburn presents for a follow-up visit for depression, anxiety, COPD and weight loss management.  At her previous office visit her Wellbutrin dose was increased to 300 mg and her Lyrica dose was also increased.  Since then, she stopped taking Wellbutrin and Celebrex.  She reports she is feeling better since stopping the Celebrex and Wellbutrin.  Her Lyrica dose was also increased at her previous office visit which she states is helping and would like to this medication at the current dose. Patient reports that she is working on adding natural supplements, incorporating exercise and eating healthier. Patient has lost 3 pounds since her previous office visit ORS is 370    Current Medication: Outpatient Encounter Medications as of 04/14/2021  Medication Sig Note   anastrozole (ARIMIDEX) 1 MG tablet TAKE 1 TABLET BY MOUTH ONCE A DAY    aspirin EC 81 MG tablet Take 81 mg by mouth daily. Swallow whole.    busPIRone (BUSPAR) 30 MG tablet Take 1 tablet (30 mg total) by mouth 2 (two) times daily.    Calcium Carb-Cholecalciferol (CALCIUM 600+D3 PO) Take 1 tablet by mouth in the morning and at bedtime. 08/25/2019: Pt does not know dose of vit d 3   Fluticasone-Umeclidin-Vilant (TRELEGY ELLIPTA) 100-62.5-25 MCG/ACT AEPB Inhale 1 puff into the lungs daily.    ipratropium-albuterol (DUONEB) 0.5-2.5 (3) MG/3ML SOLN Take 3 mLs by nebulization every 6 (six) hours as needed.    levocetirizine (XYZAL) 5 MG tablet Take 1 tablet (5 mg total) by mouth every evening.    metFORMIN (GLUCOPHAGE) 500 MG tablet Take 1 tablet (500 mg total) by mouth 2 (two) times daily with a meal.    montelukast (SINGULAIR) 10 MG tablet TAKE 1 TABLET  BY MOUTH EVERY NIGHT AT BEDTIME    Multiple Vitamins-Minerals (ALIVE WOMENS ENERGY PO) Take 1 Dose by mouth daily.    pregabalin (LYRICA) 75 MG capsule Take 1 capsule (75 mg total) by mouth 2 (two) times daily.    rosuvastatin (CRESTOR) 5 MG tablet Take 1 tablet (5 mg total) by mouth daily.    traZODone (DESYREL) 50 MG tablet Take 1-2 tablets (50-100 mg total) by mouth at bedtime as needed for sleep.    [DISCONTINUED] albuterol (VENTOLIN HFA) 108 (90 Base) MCG/ACT inhaler Inhale 2 puffs into the lungs every 6 (six) hours as needed for wheezing or shortness of breath.    [DISCONTINUED] ALPRAZolam (XANAX) 0.5 MG tablet Take 1 tablet (0.5 mg total) by mouth 3 (three) times daily as needed for anxiety or sleep.    [DISCONTINUED] azelastine (ASTELIN) 0.1 % nasal spray Place 1 spray into both nostrils 2 (two) times daily. Use in each nostril as directed    [DISCONTINUED] HYDROcodone-acetaminophen (NORCO) 5-325 MG tablet Take 1 tablet by mouth every 6 (six) hours as needed for severe pain.    [DISCONTINUED] sertraline (ZOLOFT) 100 MG tablet TAKE 1 AND 1/2 TABLETS BY MOUTH DAILY    [DISCONTINUED] SYMBICORT 160-4.5 MCG/ACT inhaler Inhale 2 puffs into the lungs 2 (two) times daily.    [DISCONTINUED] tiotropium (SPIRIVA HANDIHALER) 18 MCG inhalation capsule Place 1 capsule (18 mcg total) into inhaler and inhale daily.    albuterol (VENTOLIN  HFA) 108 (90 Base) MCG/ACT inhaler Inhale 2 puffs into the lungs every 6 (six) hours as needed for wheezing or shortness of breath.    ALPRAZolam (XANAX) 0.5 MG tablet Take 1 tablet (0.5 mg total) by mouth 3 (three) times daily as needed for anxiety or sleep.    azelastine (ASTELIN) 0.1 % nasal spray Place 1 spray into both nostrils 2 (two) times daily. Use in each nostril as directed    HYDROcodone-acetaminophen (NORCO) 5-325 MG tablet Take 1 tablet by mouth every 6 (six) hours as needed for severe pain.    sertraline (ZOLOFT) 100 MG tablet Take 1.5 tablets (150 mg total)  by mouth daily.    [DISCONTINUED] celecoxib (CELEBREX) 100 MG capsule Take 1 capsule (100 mg total) by mouth 2 (two) times daily. (Patient not taking: Reported on 04/14/2021)    No facility-administered encounter medications on file as of 04/14/2021.    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   BREAST LUMPECTOMY WITH NEEDLE LOCALIZATION Right 03/03/2018   Procedure: BREAST LUMPECTOMY WITH NEEDLE LOCALIZATION;  Surgeon: Vickie Epley, MD;  Location: [facility] ORS;  Service: General;  Laterality: Right;   COLONOSCOPY WITH PROPOFOL N/A 08/31/2019   Procedure: COLONOSCOPY WITH PROPOFOL;  Surgeon: Jonathon Bellows, MD;  Location: [facility] ENDOSCOPY;  Service: Gastroenterology;  Laterality: N/A;   SENTINEL NODE BIOPSY Right 03/21/2018   Procedure: RIGHT SENTINEL LYMPH  NODE BIOPSY;  Surgeon: Vickie Epley, MD;  Location: [facility] ORS;  Service: General;  Laterality: Right;    Medical History: Past Medical History:  Diagnosis Date   Allergy    Arthritis    Breast cancer ([place]) 12/2017   right breast   COPD (chronic obstructive pulmonary disease) ([place])    Depression    Personal history of radiation therapy    Prediabetes    Sleep apnea    supposed to get CPAP 03/25/19    Family History: Family History  Problem Relation Age of Onset   Breast cancer Mother 69   Lung cancer Mother    Breast cancer Maternal Grandmother    Thyroid cancer Father     Social History   Socioeconomic History   Marital status: Single    Spouse name: Not on file   Number of children: 2   Years of education: Not on file   Highest education level: Not on file  Occupational History   Not on file  Tobacco Use   Smoking status: Every Day    Packs/day: 2.00    Years: 39.00    Pack years: 78.00    Types: Cigarettes,  E-cigarettes   Smokeless tobacco: Never   Tobacco comments:    in the process of trying to quit-chantix  Vaping Use   Vaping Use: Former   Devices: used  2 weeks per pt  Substance and Sexual Activity   Alcohol use: No   Drug use: Yes    Types: Marijuana    Comment: occ   Sexual activity: Yes  Other Topics Concern   Not on file  Social History Narrative   Not on file   Social Determinants of Health   Financial Resource Strain: Not on file  Food Insecurity: Not on file  Transportation Needs: Not on file  Physical Activity: Not on file  Stress: Not on file  Social Connections: Not on file  Intimate Partner Violence: Not on file      Review of Systems  Constitutional:  Positive for fatigue. Negative for chills and unexpected weight change.  HENT:  Positive for congestion, ear pain, postnasal drip, rhinorrhea, sinus pressure and sneezing. Negative for sore throat.   Eyes:  Negative for redness.  Respiratory:  Negative for cough, chest tightness and shortness of breath.   Cardiovascular:  Negative for chest pain and palpitations.  Gastrointestinal:  Negative for abdominal pain, constipation, diarrhea, nausea and vomiting.  Genitourinary:  Negative for dysuria and frequency.  Musculoskeletal:  Negative for arthralgias, back pain, joint swelling and neck pain.  Skin:  Negative for rash.  Neurological: Negative.  Negative for tremors and numbness.  Hematological:  Negative for adenopathy. Does not bruise/bleed easily.  Psychiatric/Behavioral:  Negative for behavioral problems (Depression), sleep disturbance and suicidal ideas. The patient is not nervous/anxious.    Vital Signs: BP 116/72    Pulse 90    Temp 98.7 F (37.1 C)    Resp 16    Ht '5\' 2"'$  (1.575 m)    Wt 213 lb (96.6 kg)    SpO2 97%    BMI 38.96 kg/m    Physical Exam Vitals reviewed.  Constitutional:      General: She is not in acute distress.    Appearance: Normal appearance. She is obese. She is not  ill-appearing.  HENT:     Head: Normocephalic and atraumatic.  Eyes:     Pupils: Pupils are equal, round, and reactive to light.  Cardiovascular:     Rate and Rhythm: Normal rate and regular rhythm.     Heart sounds: Normal heart sounds. No murmur heard. Pulmonary:     Effort: Pulmonary effort is normal. No respiratory distress.     Breath sounds: Normal breath sounds. No wheezing.  Neurological:     Mental Status: She is alert and oriented to person, place, and time.  Psychiatric:        Mood and Affect: Mood normal.        Behavior: Behavior normal.       Assessment/Plan: 1. Chronic obstructive pulmonary disease, unspecified COPD type (HCC) Albuterol inhaler refills ordered.  Patient would like to try Trelegy Ellipta.  4 weeks of samples provided to patient in office today.  Symbicort and Spiriva discontinued. - Fluticasone-Umeclidin-Vilant (TRELEGY ELLIPTA) 100-62.5-25 MCG/ACT AEPB; Inhale 1 puff into the lungs daily.  Dispense: 1 each; Refill: 11 - albuterol (VENTOLIN HFA) 108 (90 Base) MCG/ACT inhaler; Inhale 2 puffs into the lungs every 6 (six) hours as needed for wheezing or shortness of breath.  Dispense: 18 g; Refill: 5  2. chronic upper back pain Patient has chronic back pain.  Norco refilled. - HYDROcodone-acetaminophen (NORCO) 5-325 MG tablet; Take 1 tablet by mouth every 6 (six) hours as needed for severe pain.  Dispense: 20 tablet; Refill: 0  3. Environmental allergies Refills ordered - azelastine (ASTELIN) 0.1 % nasal spray; Place 1 spray into both nostrils 2 (two) times daily. Use in each nostril as directed  Dispense: 30 mL; Refill: 12  4. Depression, major, single episode, moderate (HCC) Stable refills ordered - sertraline (ZOLOFT) 100 MG tablet; Take 1.5 tablets (150 mg total) by mouth daily.  Dispense: 135 tablet; Refill: 1  5. GAD (generalized anxiety disorder) Stable refills ordered - sertraline (ZOLOFT) 100 MG tablet; Take 1.5 tablets (150 mg total) by  mouth daily.  Dispense: 135 tablet; Refill:  1 - ALPRAZolam (XANAX) 0.5 MG tablet; Take 1 tablet (0.5 mg total) by mouth 3 (three) times daily as needed for anxiety or sleep.  Dispense: 90 tablet; Refill: 1   General Counseling: Margaret Blackburn verbalizes understanding of the findings of todays visit and agrees with plan of treatment. I have discussed any further diagnostic evaluation that may be needed or ordered today. We also reviewed her medications today. she has been encouraged to call the office with any questions or concerns that should arise related to todays visit.    No orders of the defined types were placed in this encounter.   Meds ordered this encounter  Medications   sertraline (ZOLOFT) 100 MG tablet    Sig: Take 1.5 tablets (150 mg total) by mouth daily.    Dispense:  135 tablet    Refill:  1   HYDROcodone-acetaminophen (NORCO) 5-325 MG tablet    Sig: Take 1 tablet by mouth every 6 (six) hours as needed for severe pain.    Dispense:  20 tablet    Refill:  0   azelastine (ASTELIN) 0.1 % nasal spray    Sig: Place 1 spray into both nostrils 2 (two) times daily. Use in each nostril as directed    Dispense:  30 mL    Refill:  12   ALPRAZolam (XANAX) 0.5 MG tablet    Sig: Take 1 tablet (0.5 mg total) by mouth 3 (three) times daily as needed for anxiety or sleep.    Dispense:  90 tablet    Refill:  1   Fluticasone-Umeclidin-Vilant (TRELEGY ELLIPTA) 100-62.5-25 MCG/ACT AEPB    Sig: Inhale 1 puff into the lungs daily.    Dispense:  1 each    Refill:  11   albuterol (VENTOLIN HFA) 108 (90 Base) MCG/ACT inhaler    Sig: Inhale 2 puffs into the lungs every 6 (six) hours as needed for wheezing or shortness of breath.    Dispense:  18 g    Refill:  5    Return in about 3 months (around 07/15/2021) for F/U, Anxiety/depression, anxiety med refill, Margaret Blackburn PCP.   Total time spent:30 Minutes Time spent includes review of chart, medications, test results, and follow up plan with the  patient.   [place] Controlled Substance Database was reviewed by me.  This patient was seen by Jonetta Osgood, FNP-C in collaboration with Dr. Clayborn Bigness as a part of collaborative care agreement.   Mckinzey Entwistle R. Valetta Fuller, MSN, FNP-C Internal medicine

## 2021-04-16 ENCOUNTER — Telehealth: Payer: Self-pay

## 2021-04-16 DIAGNOSIS — J449 Chronic obstructive pulmonary disease, unspecified: Secondary | ICD-10-CM

## 2021-04-16 MED ORDER — TRELEGY ELLIPTA 100-62.5-25 MCG/ACT IN AEPB: 1.0000 | INHALATION_SPRAY | Freq: Every day | RESPIRATORY_TRACT | 11 refills | Status: DC

## 2021-04-16 NOTE — Telephone Encounter (Signed)
PA for TRELEGY 100-62.5-25 sent 04/16/21 @ 645 pm and came back approved ?PA-Case # [id number]  ?Valid from 04/16/21 to 04/16/22 ?New rx sent to pharmacy ?

## 2021-04-26 ENCOUNTER — Telehealth: Payer: Self-pay

## 2021-04-26 NOTE — Telephone Encounter (Signed)
Completed medical records for [facility] ? ?Mailed to [facility] [address] [facility] [place] ?[address] ?[place], [place], [address] ? ?

## 2021-05-09 ENCOUNTER — Other Ambulatory Visit: Payer: Self-pay | Admitting: Nurse Practitioner

## 2021-05-19 ENCOUNTER — Telehealth: Payer: Self-pay

## 2021-05-19 ENCOUNTER — Other Ambulatory Visit: Payer: Self-pay | Admitting: Nurse Practitioner

## 2021-05-19 ENCOUNTER — Other Ambulatory Visit: Payer: Self-pay | Admitting: *Deleted

## 2021-05-19 DIAGNOSIS — M549 Dorsalgia, unspecified: Secondary | ICD-10-CM

## 2021-05-19 DIAGNOSIS — F17210 Nicotine dependence, cigarettes, uncomplicated: Secondary | ICD-10-CM

## 2021-05-19 DIAGNOSIS — Z87891 Personal history of nicotine dependence: Secondary | ICD-10-CM

## 2021-05-19 DIAGNOSIS — Z122 Encounter for screening for malignant neoplasm of respiratory organs: Secondary | ICD-10-CM

## 2021-05-19 MED ORDER — HYDROCODONE-ACETAMINOPHEN 5-325 MG PO TABS: 1.0000 | ORAL_TABLET | Freq: Four times a day (QID) | ORAL | 0 refills | Status: DC | PRN

## 2021-05-20 NOTE — Telephone Encounter (Signed)
error 

## 2021-05-20 NOTE — Telephone Encounter (Signed)
Med was sent to pharmacy yesterday ?

## 2021-05-30 ENCOUNTER — Ambulatory Visit
Admission: RE | Admit: 2021-05-30 | Discharge: 2021-05-30 | Disposition: A | Discharge status: Home / Self Care | Payer: Medicaid Other | Source: Ambulatory Visit | Attending: Acute Care | Admitting: Acute Care

## 2021-05-30 DIAGNOSIS — F17210 Nicotine dependence, cigarettes, uncomplicated: Secondary | ICD-10-CM | POA: Diagnosis present

## 2021-05-30 DIAGNOSIS — Z87891 Personal history of nicotine dependence: Secondary | ICD-10-CM | POA: Insufficient documentation

## 2021-05-30 DIAGNOSIS — Z122 Encounter for screening for malignant neoplasm of respiratory organs: Secondary | ICD-10-CM | POA: Insufficient documentation

## 2021-06-01 ENCOUNTER — Other Ambulatory Visit: Payer: Self-pay

## 2021-06-01 DIAGNOSIS — Z122 Encounter for screening for malignant neoplasm of respiratory organs: Secondary | ICD-10-CM

## 2021-06-01 DIAGNOSIS — F17210 Nicotine dependence, cigarettes, uncomplicated: Secondary | ICD-10-CM

## 2021-06-01 DIAGNOSIS — Z87891 Personal history of nicotine dependence: Secondary | ICD-10-CM

## 2021-06-12 ENCOUNTER — Telehealth: Payer: Self-pay

## 2021-06-12 NOTE — Telephone Encounter (Signed)
Left vm to confirm 06/19/21 appointment-Toni ?

## 2021-06-13 ENCOUNTER — Telehealth: Payer: Self-pay

## 2021-06-17 ENCOUNTER — Other Ambulatory Visit: Payer: Self-pay | Admitting: Nurse Practitioner

## 2021-06-17 DIAGNOSIS — F411 Generalized anxiety disorder: Secondary | ICD-10-CM

## 2021-06-17 MED ORDER — ALPRAZOLAM 0.5 MG PO TABS: 0.5000 mg | ORAL_TABLET | Freq: Three times a day (TID) | ORAL | 1 refills | Status: DC | PRN

## 2021-06-19 ENCOUNTER — Telehealth: Payer: Self-pay

## 2021-06-19 ENCOUNTER — Encounter: Payer: Self-pay | Admitting: Internal Medicine

## 2021-06-19 ENCOUNTER — Ambulatory Visit: Payer: Medicaid Other | Admitting: Internal Medicine

## 2021-06-19 VITALS — BP 115/78 | HR 107 | Temp 98.6°F | Resp 16 | Ht 62.0 in | Wt 209.0 lb

## 2021-06-19 DIAGNOSIS — F17210 Nicotine dependence, cigarettes, uncomplicated: Secondary | ICD-10-CM

## 2021-06-19 DIAGNOSIS — R0602 Shortness of breath: Secondary | ICD-10-CM | POA: Diagnosis not present

## 2021-06-19 DIAGNOSIS — J449 Chronic obstructive pulmonary disease, unspecified: Secondary | ICD-10-CM

## 2021-06-19 DIAGNOSIS — G4733 Obstructive sleep apnea (adult) (pediatric): Secondary | ICD-10-CM | POA: Diagnosis not present

## 2021-06-19 MED ORDER — AZITHROMYCIN 250 MG PO TABS: ORAL_TABLET | ORAL | 0 refills | Status: AC

## 2021-06-19 NOTE — Telephone Encounter (Signed)
Awaiting 06/19/21 office notes for SS order-Toni ?

## 2021-06-19 NOTE — Telephone Encounter (Signed)
Done

## 2021-06-19 NOTE — Patient Instructions (Signed)

## 2021-06-19 NOTE — Telephone Encounter (Signed)
Send med 

## 2021-06-19 NOTE — Progress Notes (Signed)
[facility] [place], [place] [address]  Pulmonary Sleep Medicine   Office Visit Note  Patient Name: Margaret Blackburn DOB: [birth year] MRN [id number]  Date of Service: 06/19/2021  Complaints/HPI: OSA. She had been diagnosed with OSA over 2 years ago. She had turned her CPAP in to [facility] now she wants to try it again. Patient states she has been feeling tired and has headaches on waking. She is snoring. She has been having a restless sleep. She has witnessed apneas by a friend who saw her not breathing at night. Patient has not fallen asleep driving. She does fall asleep when watching TV. She now wants to be reassessed for a CPAP device. ESS scaore of 18  ROS  General: (-) fever, (-) chills, (-) night sweats, (-) weakness Skin: (-) rashes, (-) itching,. Eyes: (-) visual changes, (-) redness, (-) itching. Nose and Sinuses: (-) nasal stuffiness or itchiness, (-) postnasal drip, (-) nosebleeds, (-) sinus trouble. Mouth and Throat: (-) sore throat, (-) hoarseness. Neck: (-) swollen glands, (-) enlarged thyroid, (-) neck pain. Respiratory: - cough, (-) bloody sputum, - shortness of breath, - wheezing. Cardiovascular: - ankle swelling, (-) chest pain. Lymphatic: (-) lymph node enlargement. Neurologic: (-) numbness, (-) tingling. Psychiatric: (-) anxiety, (-) depression   Current Medication: Outpatient Encounter Medications as of 06/19/2021  Medication Sig Note   albuterol (VENTOLIN HFA) 108 (90 Base) MCG/ACT inhaler Inhale 2 puffs into the lungs every 6 (six) hours as needed for wheezing or shortness of breath.    ALPRAZolam (XANAX) 0.5 MG tablet Take 1 tablet (0.5 mg total) by mouth 3 (three) times daily as needed for anxiety or sleep.    anastrozole (ARIMIDEX) 1 MG tablet TAKE 1 TABLET BY MOUTH ONCE A DAY    aspirin EC 81 MG tablet Take 81 mg by mouth daily. Swallow whole.    azelastine (ASTELIN) 0.1 % nasal spray Place 1 spray into both nostrils 2 (two) times  daily. Use in each nostril as directed    busPIRone (BUSPAR) 30 MG tablet Take 1 tablet (30 mg total) by mouth 2 (two) times daily.    Calcium Carb-Cholecalciferol (CALCIUM 600+D3 PO) Take 1 tablet by mouth in the morning and at bedtime. 08/25/2019: Pt does not know dose of vit d 3   Fluticasone-Umeclidin-Vilant (TRELEGY ELLIPTA) 100-62.5-25 MCG/ACT AEPB Inhale 1 puff into the lungs daily.    HYDROcodone-acetaminophen (NORCO) 5-325 MG tablet Take 1 tablet by mouth every 6 (six) hours as needed for severe pain.    ipratropium-albuterol (DUONEB) 0.5-2.5 (3) MG/3ML SOLN Take 3 mLs by nebulization every 6 (six) hours as needed.    levocetirizine (XYZAL) 5 MG tablet Take 1 tablet (5 mg total) by mouth every evening.    metFORMIN (GLUCOPHAGE) 500 MG tablet Take 1 tablet (500 mg total) by mouth 2 (two) times daily with a meal.    montelukast (SINGULAIR) 10 MG tablet TAKE 1 TABLET BY MOUTH EVERY NIGHT AT BEDTIME    Multiple Vitamins-Minerals (ALIVE WOMENS ENERGY PO) Take 1 Dose by mouth daily.    pregabalin (LYRICA) 75 MG capsule Take 1 capsule (75 mg total) by mouth 2 (two) times daily.    rosuvastatin (CRESTOR) 5 MG tablet Take 1 tablet (5 mg total) by mouth daily.    sertraline (ZOLOFT) 100 MG tablet Take 1.5 tablets (150 mg total) by mouth daily.    traZODone (DESYREL) 50 MG tablet Take 1-2 tablets (50-100 mg total) by mouth at bedtime as needed for sleep.  No facility-administered encounter medications on file as of 06/19/2021.    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   BREAST LUMPECTOMY WITH NEEDLE LOCALIZATION Right 03/03/2018   Procedure: BREAST LUMPECTOMY WITH NEEDLE LOCALIZATION;  Surgeon: Vickie Epley, MD;  Location: [facility] ORS;  Service: General;  Laterality: Right;   COLONOSCOPY  WITH PROPOFOL N/A 08/31/2019   Procedure: COLONOSCOPY WITH PROPOFOL;  Surgeon: Jonathon Bellows, MD;  Location: [facility] ENDOSCOPY;  Service: Gastroenterology;  Laterality: N/A;   SENTINEL NODE BIOPSY Right 03/21/2018   Procedure: RIGHT SENTINEL LYMPH  NODE BIOPSY;  Surgeon: Vickie Epley, MD;  Location: [facility] ORS;  Service: General;  Laterality: Right;    Medical History: Past Medical History:  Diagnosis Date   Allergy    Arthritis    Breast cancer ([place]) 12/2017   right breast   COPD (chronic obstructive pulmonary disease) ([place])    Depression    Personal history of radiation therapy    Prediabetes    Sleep apnea    supposed to get CPAP 03/25/19    Family History: Family History  Problem Relation Age of Onset   Breast cancer Mother 83   Lung cancer Mother    Breast cancer Maternal Grandmother    Thyroid cancer Father     Social History: Social History   Socioeconomic History   Marital status: Single    Spouse name: Not on file   Number of children: 2   Years of education: Not on file   Highest education level: Not on file  Occupational History   Not on file  Tobacco Use   Smoking status: Every Day    Packs/day: 2.00    Years: 39.00    Pack years: 78.00    Types: Cigarettes, E-cigarettes   Smokeless tobacco: Never   Tobacco comments:    in the process of trying to quit-chantix  Vaping Use   Vaping Use: Former   Devices: used  2 weeks per pt  Substance and Sexual Activity   Alcohol use: No   Drug use: Yes    Types: Marijuana    Comment: occ   Sexual activity: Yes  Other Topics Concern   Not on file  Social History Narrative   Not on file   Social Determinants of Health   Financial Resource Strain: Not on file  Food Insecurity: Not on file  Transportation Needs: Not on file  Physical Activity: Not on file  Stress: Not on file  Social Connections: Not on file  Intimate Partner Violence: Not on file    Vital Signs: Blood pressure 115/78, pulse (!) 107,  temperature 98.6 F (37 C), resp. rate 16, height '5\' 2"'$  (1.575 m), weight 209 lb (94.8 kg), SpO2 98 %.  Examination: General Appearance: The patient is well-developed, well-nourished, and in no distress. Skin: Gross inspection of skin unremarkable. Head: normocephalic, no gross deformities. Eyes: no gross deformities noted. ENT: ears appear grossly normal no exudates. Neck: Supple. No thyromegaly. No LAD. Respiratory: no rhonchi noted. Cardiovascular: Normal S1 and S2 without murmur or rub. Extremities: No cyanosis. pulses are equal. Neurologic: Alert and oriented. No involuntary movements.  LABS: No results found for this or any previous visit (from the past 2160 hour(s)).  Radiology: CT CHEST LUNG CA SCREEN LOW  DOSE W/O CM  Result Date: 05/31/2021 CLINICAL DATA:  Current smoker with 70 pack-year history EXAM: CT CHEST WITHOUT CONTRAST LOW-DOSE FOR LUNG CANCER SCREENING TECHNIQUE: Multidetector CT imaging of the chest was performed following the standard protocol without IV contrast. RADIATION DOSE REDUCTION: This exam was performed according to the departmental dose-optimization program which includes automated exposure control, adjustment of the mA and/or kV according to patient size and/or use of iterative reconstruction technique. COMPARISON:  None. FINDINGS: Cardiovascular: Normal heart size. No pericardial effusion. Mild calcified plaque of the thoracic aorta. Mediastinum/Nodes: Esophagus and thyroid are unremarkable. No pathologically enlarged lymph nodes seen in the chest. Lungs/Pleura: Central airways are patent. Paraseptal and centrilobular emphysema. No consolidation, pleural effusion or pneumothorax. Stable small solid pulmonary nodules. Largest is located in the right lower lobe and measures 3.7 mm in mean diameter on image 180. Upper Abdomen: Low-attenuation mass of the right adrenal gland measuring 3.0 cm, compatible with benign adenoma, no further follow-up imaging recommended.  No acute abnormality. Musculoskeletal: Thoracic spine orthopedic hardware. No aggressive appearing osseous lesions. IMPRESSION: 1. Lung-RADS 2, benign appearance or behavior. Continue annual screening with low-dose chest CT without contrast in 12 months. 2. Aortic Atherosclerosis (ICD10-I70.0) and Emphysema (ICD10-J43.9). Electronically Signed   By: Yetta Glassman M.D.   On: 05/31/2021 08:28   No results found.  CT CHEST LUNG CA SCREEN LOW DOSE W/O CM  Result Date: 05/31/2021 CLINICAL DATA:  Current smoker with 70 pack-year history EXAM: CT CHEST WITHOUT CONTRAST LOW-DOSE FOR LUNG CANCER SCREENING TECHNIQUE: Multidetector CT imaging of the chest was performed following the standard protocol without IV contrast. RADIATION DOSE REDUCTION: This exam was performed according to the departmental dose-optimization program which includes automated exposure control, adjustment of the mA and/or kV according to patient size and/or use of iterative reconstruction technique. COMPARISON:  None. FINDINGS: Cardiovascular: Normal heart size. No pericardial effusion. Mild calcified plaque of the thoracic aorta. Mediastinum/Nodes: Esophagus and thyroid are unremarkable. No pathologically enlarged lymph nodes seen in the chest. Lungs/Pleura: Central airways are patent. Paraseptal and centrilobular emphysema. No consolidation, pleural effusion or pneumothorax. Stable small solid pulmonary nodules. Largest is located in the right lower lobe and measures 3.7 mm in mean diameter on image 180. Upper Abdomen: Low-attenuation mass of the right adrenal gland measuring 3.0 cm, compatible with benign adenoma, no further follow-up imaging recommended. No acute abnormality. Musculoskeletal: Thoracic spine orthopedic hardware. No aggressive appearing osseous lesions. IMPRESSION: 1. Lung-RADS 2, benign appearance or behavior. Continue annual screening with low-dose chest CT without contrast in 12 months. 2. Aortic Atherosclerosis  (ICD10-I70.0) and Emphysema (ICD10-J43.9). Electronically Signed   By: Yetta Glassman M.D.   On: 05/31/2021 08:28     Assessment and Plan: Patient Active Problem List   Diagnosis Date Noted   Lumbar radicular pain 12/08/2019   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) 10/27/2019   Lumbar facet arthropathy 10/27/2019   Trochanteric bursitis of both hips 10/27/2019   Chronic pain syndrome 10/27/2019   Respiratory tract infection due to COVID-19 virus 03/27/2019   Cough 03/27/2019   Invasive carcinoma of breast ([place]) 03/24/2018   Goals of care, counseling/discussion 03/24/2018   Malignant neoplasm of right breast greater than or equal to 2 cm in greatest dimension [facility])    Mass of right breast 02/24/2018    1. SOB (shortness of breath) Spirometry was done today will again encourage smoking cessation.  She unfortunately continues to smoke and I think this is contributing to her symptoms - Spirometry  with Graph  2. OSA (obstructive sleep apnea) She now wants to go back on CPAP we will have to start with a fresh sleep study we will go ahead and order a CPAP titration once a baseline PSG is done - PSG Sleep Study; Future  3. Obesity, morbid ([place]) Obesity Counseling: Had a lengthy discussion regarding patients BMI and weight issues. Patient was instructed on portion control as well as increased activity. Also discussed caloric restrictions with trying to maintain intake less than 2000 Kcal. Discussions were made in accordance with the 5As of weight management. Simple actions such as not eating late and if able to, taking a walk is suggested.   4. Obstructive chronic bronchitis without exacerbation ([place]) Still smoking she is on albuterol continue to use the albuterol as well as the Trelegy which has been prescribed.  But once again very strongly urged to quit smoking  5. Moderate smoker (20 or less per day) Smoking cessation instruction/counseling given:  counseled patient on the  dangers of tobacco use, advised patient to stop smoking, and reviewed strategies to maximize success     General Counseling: I have discussed the findings of the evaluation and examination with [facility].  I have also discussed any further diagnostic evaluation thatmay be needed or ordered today. Janda verbalizes understanding of the findings of todays visit. We also reviewed her medications today and discussed drug interactions and side effects including but not limited excessive drowsiness and altered mental states. We also discussed that there is always a risk not just to her but also people around her. she has been encouraged to call the office with any questions or concerns that should arise related to todays visit.  Orders Placed This Encounter  Procedures   Spirometry with Graph    Order Specific Question:   Where should this test be performed?    Answer:   [facility]    Order Specific Question:   Basic spirometry    Answer:   Yes     Time spent: 38  I have personally obtained a history, examined the patient, evaluated laboratory and imaging results, formulated the assessment and plan and placed orders.    Allyne Gee, MD [facility] Pulmonary and Critical Care Sleep medicine

## 2021-06-26 ENCOUNTER — Other Ambulatory Visit: Payer: Self-pay

## 2021-06-26 ENCOUNTER — Emergency Department: Payer: Medicaid Other

## 2021-06-26 ENCOUNTER — Encounter: Payer: Self-pay | Admitting: Medical Oncology

## 2021-06-26 ENCOUNTER — Emergency Department
Admission: EM | Admit: 2021-06-26 | Discharge: 2021-06-26 | Disposition: A | Discharge status: Home / Self Care | Payer: Medicaid Other | Attending: Emergency Medicine | Admitting: Emergency Medicine

## 2021-06-26 DIAGNOSIS — S299XXA Unspecified injury of thorax, initial encounter: Secondary | ICD-10-CM | POA: Diagnosis present

## 2021-06-26 DIAGNOSIS — W06XXXA Fall from bed, initial encounter: Secondary | ICD-10-CM | POA: Insufficient documentation

## 2021-06-26 DIAGNOSIS — Y92003 Bedroom of unspecified non-institutional (private) residence as the place of occurrence of the external cause: Secondary | ICD-10-CM | POA: Diagnosis not present

## 2021-06-26 DIAGNOSIS — Z853 Personal history of malignant neoplasm of breast: Secondary | ICD-10-CM | POA: Insufficient documentation

## 2021-06-26 DIAGNOSIS — S20211A Contusion of right front wall of thorax, initial encounter: Secondary | ICD-10-CM | POA: Diagnosis not present

## 2021-06-26 DIAGNOSIS — R0781 Pleurodynia: Secondary | ICD-10-CM

## 2021-06-26 MED ORDER — HYDROCODONE-ACETAMINOPHEN 5-325 MG PO TABS
1.0000 | ORAL_TABLET | Freq: Once | ORAL | Status: AC
  Administered 2021-06-26: 1 via ORAL
  Filled 2021-06-26: qty 1

## 2021-06-26 MED ORDER — LIDOCAINE 5 % EX PTCH: 1.0000 | MEDICATED_PATCH | Freq: Two times a day (BID) | CUTANEOUS | 0 refills | Status: AC

## 2021-06-26 MED ORDER — CYCLOBENZAPRINE HCL 5 MG PO TABS
5.0000 mg | ORAL_TABLET | Freq: Three times a day (TID) | ORAL | 0 refills | Status: DC | PRN
Start: 2021-06-26 — End: 2021-07-19

## 2021-06-26 MED ORDER — HYDROCODONE-ACETAMINOPHEN 5-325 MG PO TABS: 1.0000 | ORAL_TABLET | Freq: Three times a day (TID) | ORAL | 0 refills | Status: DC | PRN

## 2021-06-26 MED ORDER — LIDOCAINE 5 % EX PTCH
2.0000 | MEDICATED_PATCH | Freq: Once | CUTANEOUS | Status: DC
  Administered 2021-06-26: 2 via TRANSDERMAL
  Filled 2021-06-26: qty 2

## 2021-06-26 NOTE — ED Triage Notes (Signed)
Pt reports she was reaching out of her bed to get something and fell onto rt rib cage last night. Pt reports pain to rt side ribs. Denies SOB.

## 2021-06-26 NOTE — Discharge Instructions (Addendum)
Your exam and chest XR do not reveal any rib fractures. Take the prescription meds as directed. Apply ice and/or moist heat to reduce symptoms. Follow-up with your provider as needed.

## 2021-06-26 NOTE — ED Provider Notes (Signed)
[facility] Emergency Department Provider Note     Event Date/Time   First MD Initiated Contact with Patient 06/26/21 1554     (approximate)   History   Rib Injury   HPI  Margaret Blackburn is a 56 y.o. female with a history of right breast carcinoma, presents to the ED with mechanical injury to the right chest wall.  She reports reaching out of her bed last night, when she tipped over, landing on her right rib cage.  She denies any head injury or LOC, but presents with acute right rib pain.  Patient denies any shortness of breath, cough, or hemoptysis.  Physical Exam   Triage Vital Signs: ED Triage Vitals  Enc Vitals Group     BP 06/26/21 1511 124/78     Pulse Rate 06/26/21 1511 86     Resp 06/26/21 1511 18     Temp 06/26/21 1511 98.2 F (36.8 C)     Temp Source 06/26/21 1511 Oral     SpO2 06/26/21 1511 94 %     Weight 06/26/21 1512 210 lb (95.3 kg)     Height 06/26/21 1512 '5\' 2"'$  (1.575 m)     Head Circumference --      Peak Flow --      Pain Score 06/26/21 1512 10     Pain Loc --      Pain Edu? --      Excl. in [place]? --     Most recent vital signs: Vitals:   06/26/21 1511  BP: 124/78  Pulse: 86  Resp: 18  Temp: 98.2 F (36.8 C)  SpO2: 94%    General Awake, no distress. NAD HEENT NCAT. PERRL. EOMI. No rhinorrhea. Mucous membranes are moist.  CV:  Good peripheral perfusion.  RESP:  Normal effort. CTA. Lateral soft tissue ecchymosis noted to the lateral right rib cage and back ABD:  No distention. Soft, nontender    ED Results / Procedures / Treatments   Labs (all labs ordered are listed, but only abnormal results are displayed) Labs Reviewed - No data to display   EKG   RADIOLOGY  I personally viewed and evaluated these images as part of my medical decision making, as well as reviewing the written report by the radiologist.  ED Provider Interpretation: no acute findings}  DG Chest 2 View  Result Date:  06/26/2021 CLINICAL DATA:  Right rib pain EXAM: CHEST - 2 VIEW COMPARISON:  06/10/2019 FINDINGS: Mild patchy density at the right lung base. No pleural effusion or pneumothorax. Normal heart size. Harrington rod. No displaced rib fracture. IMPRESSION: Mild atelectasis at the right lung base. Electronically Signed   By: Macy Mis M.D.   On: 06/26/2021 15:47     PROCEDURES:  Critical Care performed: No  Procedures   MEDICATIONS ORDERED IN ED: Medications - No data to display   IMPRESSION / MDM / [place] / ED COURSE  I reviewed the triage vital signs and the nursing notes.                              Differential diagnosis includes, but is not limited to, rib contusion, rib fracture, pneumothorax  Patient's diagnosis is consistent with rib contusion. Patient will be discharged home with prescriptions for cyclobenzaprine, hydrocodone, and Lidoderm patches. Patient is to follow up with her primary provider as needed or otherwise directed. Patient is given ED precautions  to return to the ED for any worsening or new symptoms.    FINAL CLINICAL IMPRESSION(S) / ED DIAGNOSES   Final diagnoses:  Rib pain on right side     Rx / DC Orders   ED Discharge Orders     None        Note:  This document was prepared using Dragon voice recognition software and may include unintentional dictation errors.    Melvenia Needles, PA-C 06/26/21 1738    Naaman Plummer, MD 06/27/21 1525

## 2021-06-29 ENCOUNTER — Other Ambulatory Visit: Payer: Self-pay | Admitting: Nurse Practitioner

## 2021-06-30 NOTE — Telephone Encounter (Signed)
SS order placed in [facility] folder-Toni 

## 2021-07-03 ENCOUNTER — Other Ambulatory Visit: Payer: Self-pay | Admitting: Nurse Practitioner

## 2021-07-03 MED ORDER — HYDROCODONE-ACETAMINOPHEN 5-325 MG PO TABS: 1.0000 | ORAL_TABLET | Freq: Three times a day (TID) | ORAL | 0 refills | Status: DC | PRN

## 2021-07-18 ENCOUNTER — Other Ambulatory Visit: Payer: Self-pay

## 2021-07-18 ENCOUNTER — Telehealth: Payer: Self-pay

## 2021-07-18 ENCOUNTER — Other Ambulatory Visit: Payer: Self-pay | Admitting: Nurse Practitioner

## 2021-07-18 DIAGNOSIS — Z9109 Other allergy status, other than to drugs and biological substances: Secondary | ICD-10-CM

## 2021-07-18 DIAGNOSIS — J449 Chronic obstructive pulmonary disease, unspecified: Secondary | ICD-10-CM

## 2021-07-18 NOTE — Telephone Encounter (Signed)
Cecille Rubin w/ [facility] has received insurance approval, will call patient to schedule SS-Toni

## 2021-07-19 ENCOUNTER — Encounter: Payer: Self-pay | Admitting: Nurse Practitioner

## 2021-07-19 ENCOUNTER — Ambulatory Visit: Payer: Medicaid Other | Admitting: Nurse Practitioner

## 2021-07-19 ENCOUNTER — Other Ambulatory Visit: Payer: Self-pay | Admitting: Nurse Practitioner

## 2021-07-19 ENCOUNTER — Other Ambulatory Visit: Payer: Self-pay | Admitting: Internal Medicine

## 2021-07-19 VITALS — BP 120/75 | HR 96 | Temp 97.6°F | Resp 16 | Ht 62.0 in | Wt 206.2 lb

## 2021-07-19 DIAGNOSIS — M5416 Radiculopathy, lumbar region: Secondary | ICD-10-CM

## 2021-07-19 DIAGNOSIS — Z9181 History of falling: Secondary | ICD-10-CM

## 2021-07-19 DIAGNOSIS — R0781 Pleurodynia: Secondary | ICD-10-CM

## 2021-07-19 DIAGNOSIS — M1990 Unspecified osteoarthritis, unspecified site: Secondary | ICD-10-CM | POA: Diagnosis not present

## 2021-07-19 DIAGNOSIS — G4709 Other insomnia: Secondary | ICD-10-CM

## 2021-07-19 DIAGNOSIS — R7303 Prediabetes: Secondary | ICD-10-CM

## 2021-07-19 DIAGNOSIS — F321 Major depressive disorder, single episode, moderate: Secondary | ICD-10-CM

## 2021-07-19 DIAGNOSIS — E782 Mixed hyperlipidemia: Secondary | ICD-10-CM

## 2021-07-19 DIAGNOSIS — M47816 Spondylosis without myelopathy or radiculopathy, lumbar region: Secondary | ICD-10-CM | POA: Diagnosis not present

## 2021-07-19 DIAGNOSIS — F411 Generalized anxiety disorder: Secondary | ICD-10-CM

## 2021-07-19 MED ORDER — SERTRALINE HCL 100 MG PO TABS: 150.0000 mg | ORAL_TABLET | Freq: Every day | ORAL | 1 refills | Status: DC

## 2021-07-19 MED ORDER — METHYLPREDNISOLONE ACETATE 80 MG/ML IJ SUSP
80.0000 mg | Freq: Once | INTRAMUSCULAR | Status: AC
  Administered 2021-07-19: 60 mg via INTRAMUSCULAR

## 2021-07-19 MED ORDER — CYCLOBENZAPRINE HCL 10 MG PO TABS: 10.0000 mg | ORAL_TABLET | Freq: Every day | ORAL | 2 refills | Status: DC

## 2021-07-19 MED ORDER — HYDROCODONE-ACETAMINOPHEN 5-325 MG PO TABS: 1.0000 | ORAL_TABLET | Freq: Two times a day (BID) | ORAL | 0 refills | Status: DC | PRN

## 2021-07-19 MED ORDER — BUSPIRONE HCL 30 MG PO TABS: 30.0000 mg | ORAL_TABLET | Freq: Two times a day (BID) | ORAL | 1 refills | Status: DC

## 2021-07-19 MED ORDER — TRAZODONE HCL 50 MG PO TABS: 50.0000 mg | ORAL_TABLET | Freq: Every evening | ORAL | 3 refills | Status: DC | PRN

## 2021-07-19 MED ORDER — PREGABALIN 150 MG PO CAPS: 150.0000 mg | ORAL_CAPSULE | Freq: Two times a day (BID) | ORAL | 2 refills | Status: DC

## 2021-07-19 MED ORDER — PREDNISONE 10 MG (21) PO TBPK: ORAL_TABLET | ORAL | 0 refills | Status: DC

## 2021-07-19 MED ORDER — ROSUVASTATIN CALCIUM 5 MG PO TABS: 5.0000 mg | ORAL_TABLET | Freq: Every day | ORAL | 3 refills | Status: DC

## 2021-07-19 MED ORDER — METFORMIN HCL 500 MG PO TABS: 500.0000 mg | ORAL_TABLET | Freq: Two times a day (BID) | ORAL | 3 refills | Status: DC

## 2021-07-19 MED ORDER — ALPRAZOLAM 0.5 MG PO TABS: 0.5000 mg | ORAL_TABLET | Freq: Three times a day (TID) | ORAL | 1 refills | Status: DC | PRN

## 2021-07-19 NOTE — Progress Notes (Signed)
[facility] [place], [place] [address]  Internal MEDICINE  Office Visit Note  Patient Name: Margaret Blackburn  [id number]  [id number]  Date of Service: 07/19/2021  Chief Complaint  Patient presents with   Follow-up   Depression   Back Pain    Pt fell on 06/26/21, ribs and back still hurt (pain scale: 6)    HPI Margaret Blackburn presents for a follow-up visit for depression, anxiety, chronic back pain as well as acute back pain and right sided rib pain due to a recent fall and she rates her pain a 6 out of 10.  She is out of hydrocodone at this time and is requesting a refill.  She also reports that her sciatica and lower extremity nerve pain and neuropathy has increased on the current dose of pregabalin 75 mg twice daily.  Due to the acute on chronic back pain exacerbated from the recent fall she most likely has increased inflammation that is causing the increased pain.  When she initially fell she also was given a small prescription of cyclobenzaprine from the emergency department and stated that it did help her sleep better. Her ORS is 310 which is decreased from previous ORS of 370 last time her PDMP was checked She is also in need of routine medication refills and all medication refills that are due will be sent at today's office visit. Her blood pressure and other vital signs are stable within normal limits.   Current Medication: Outpatient Encounter Medications as of 07/19/2021  Medication Sig Note   albuterol (VENTOLIN HFA) 108 (90 Base) MCG/ACT inhaler Inhale 2 puffs into the lungs every 6 (six) hours as needed for wheezing or shortness of breath.    anastrozole (ARIMIDEX) 1 MG tablet TAKE 1 TABLET BY MOUTH ONCE A DAY    aspirin EC 81 MG tablet Take 81 mg by mouth daily. Swallow whole.    azelastine (ASTELIN) 0.1 % nasal spray Place 1 spray into both nostrils 2 (two) times daily. Use in each nostril as directed    Calcium Carb-Cholecalciferol (CALCIUM 600+D3 PO)  Take 1 tablet by mouth in the morning and at bedtime. 08/25/2019: Pt does not know dose of vit d 3   cyclobenzaprine (FLEXERIL) 10 MG tablet Take 1 tablet (10 mg total) by mouth at bedtime. Take one tab po qhs for back spasm prn only    Fluticasone-Umeclidin-Vilant (TRELEGY ELLIPTA) 100-62.5-25 MCG/ACT AEPB Inhale 1 puff into the lungs daily.    ipratropium-albuterol (DUONEB) 0.5-2.5 (3) MG/3ML SOLN Take 3 mLs by nebulization every 6 (six) hours as needed.    levocetirizine (XYZAL) 5 MG tablet Take 1 tablet (5 mg total) by mouth every evening.    montelukast (SINGULAIR) 10 MG tablet TAKE 1 TABLET BY MOUTH EVERY NIGHT AT BEDTIME    Multiple Vitamins-Minerals (ALIVE WOMENS ENERGY PO) Take 1 Dose by mouth daily.    predniSONE (STERAPRED UNI-PAK 21 TAB) 10 MG (21) TBPK tablet Use as directed for 6 days    pregabalin (LYRICA) 150 MG capsule Take 1 capsule (150 mg total) by mouth 2 (two) times daily.    [DISCONTINUED] ALPRAZolam (XANAX) 0.5 MG tablet Take 1 tablet (0.5 mg total) by mouth 3 (three) times daily as needed for anxiety or sleep.    [DISCONTINUED] busPIRone (BUSPAR) 30 MG tablet Take 1 tablet (30 mg total) by mouth 2 (two) times daily.    [DISCONTINUED] cyclobenzaprine (FLEXERIL) 5 MG tablet Take 1 tablet (5 mg total) by mouth 3 (three)  times daily as needed.    [DISCONTINUED] metFORMIN (GLUCOPHAGE) 500 MG tablet Take 1 tablet (500 mg total) by mouth 2 (two) times daily with a meal.    [DISCONTINUED] pregabalin (LYRICA) 75 MG capsule Take 1 capsule (75 mg total) by mouth 2 (two) times daily.    [DISCONTINUED] rosuvastatin (CRESTOR) 5 MG tablet Take 1 tablet (5 mg total) by mouth daily.    [DISCONTINUED] sertraline (ZOLOFT) 100 MG tablet Take 1.5 tablets (150 mg total) by mouth daily.    [DISCONTINUED] traZODone (DESYREL) 50 MG tablet Take 1-2 tablets (50-100 mg total) by mouth at bedtime as needed for sleep.    ALPRAZolam (XANAX) 0.5 MG tablet Take 1 tablet (0.5 mg total) by mouth 3 (three)  times daily as needed for anxiety or sleep.    busPIRone (BUSPAR) 30 MG tablet Take 1 tablet (30 mg total) by mouth 2 (two) times daily.    HYDROcodone-acetaminophen (NORCO) 5-325 MG tablet Take 1 tablet by mouth 2 (two) times daily as needed for moderate pain or severe pain.    [START ON 08/16/2021] HYDROcodone-acetaminophen (NORCO) 5-325 MG tablet Take 1 tablet by mouth 2 (two) times daily as needed for moderate pain or severe pain.    metFORMIN (GLUCOPHAGE) 500 MG tablet Take 1 tablet (500 mg total) by mouth 2 (two) times daily with a meal.    rosuvastatin (CRESTOR) 5 MG tablet Take 1 tablet (5 mg total) by mouth daily.    sertraline (ZOLOFT) 100 MG tablet Take 1.5 tablets (150 mg total) by mouth daily.    traZODone (DESYREL) 50 MG tablet Take 1-2 tablets (50-100 mg total) by mouth at bedtime as needed for sleep.    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   BREAST LUMPECTOMY WITH NEEDLE LOCALIZATION Right 03/03/2018   Procedure: BREAST LUMPECTOMY WITH NEEDLE LOCALIZATION;  Surgeon: Vickie Epley, MD;  Location: [facility] ORS;  Service: General;  Laterality: Right;   COLONOSCOPY WITH PROPOFOL N/A 08/31/2019   Procedure: COLONOSCOPY WITH PROPOFOL;  Surgeon: Jonathon Bellows, MD;  Location: [facility] ENDOSCOPY;  Service: Gastroenterology;  Laterality: N/A;   SENTINEL NODE BIOPSY Right 03/21/2018   Procedure: RIGHT SENTINEL LYMPH  NODE BIOPSY;  Surgeon: Vickie Epley, MD;  Location: [facility] ORS;  Service: General;  Laterality: Right;    Medical History: Past Medical History:  Diagnosis Date   Allergy    Arthritis    Breast cancer ([place]) 12/2017   right breast    COPD (chronic obstructive pulmonary disease) ([place])    Depression    Personal history of radiation therapy    Prediabetes    Sleep apnea    supposed to get CPAP 03/25/19    Family History: Family History  Problem Relation Age of Onset   Breast cancer Mother 46   Lung cancer Mother    Breast cancer Maternal Grandmother    Thyroid cancer Father     Social History   Socioeconomic History   Marital status: Single    Spouse name: Not on file   Number of children: 2   Years of education: Not on file   Highest education level: Not on file  Occupational History  Not on file  Tobacco Use   Smoking status: Every Day    Packs/day: 2.00    Years: 39.00    Total pack years: 78.00    Types: Cigarettes, E-cigarettes   Smokeless tobacco: Never   Tobacco comments:    Smokes a pack and a half daily  Vaping Use   Vaping Use: Former   Devices: used  2 weeks per pt  Substance and Sexual Activity   Alcohol use: No   Drug use: Yes    Types: Marijuana    Comment: occ   Sexual activity: Yes  Other Topics Concern   Not on file  Social History Narrative   Not on file   Social Determinants of Health   Financial Resource Strain: Not on file  Food Insecurity: Not on file  Transportation Needs: Not on file  Physical Activity: Not on file  Stress: Not on file  Social Connections: Not on file  Intimate Partner Violence: Not on file      Review of Systems  Constitutional:  Negative for chills, fatigue and unexpected weight change.  HENT:  Negative for congestion, rhinorrhea, sneezing and sore throat.   Respiratory: Negative.  Negative for cough, chest tightness and shortness of breath.   Cardiovascular: Negative.  Negative for chest pain and palpitations.  Gastrointestinal:  Negative for abdominal pain, constipation, diarrhea, nausea and vomiting.  Musculoskeletal:  Positive for arthralgias, back pain and myalgias. Negative for joint swelling and neck pain.       Acute right sided  rib pain residual from recent fall.   Skin:  Negative for rash.  Neurological: Negative.   Psychiatric/Behavioral:  Positive for behavioral problems (Depression) and sleep disturbance. Negative for self-injury and suicidal ideas. The patient is nervous/anxious.     Vital Signs: BP 120/75   Pulse 96   Temp 97.6 F (36.4 C)   Resp 16   Ht _0  (1.575 m)   Wt 206 lb 3.2 oz (93.5 kg)   SpO2 95%   BMI 37.71 kg/m    Physical Exam Vitals reviewed.  Constitutional:      General: She is not in acute distress.    Appearance: Normal appearance. She is obese. She is not ill-appearing.  HENT:     Head: Normocephalic and atraumatic.  Eyes:     Pupils: Pupils are equal, round, and reactive to light.  Cardiovascular:     Rate and Rhythm: Normal rate and regular rhythm.  Pulmonary:     Effort: Pulmonary effort is normal. No respiratory distress.  Neurological:     Mental Status: She is alert and oriented to person, place, and time.  Psychiatric:        Mood and Affect: Mood normal.        Behavior: Behavior normal.        Assessment/Plan: 1. Inflammatory arthropathy Autoimmune labs ordered to rule out rheumatoid arthritic issues - Rheumatoid Factor - ANA Direct w/Reflex if Positive - Sed Rate (ESR) - C-reactive protein - methylPREDNISolone acetate (DEPO-MEDROL) injection 80 mg  2. Lumbar facet arthropathy Autoimmune labs ordered to rule out any rheumatoid arthritic issue, other medications ordered, Depo-Medrol injection administered in office today and a 6-day prednisone taper prescribed.,  Pregabalin dose increased 250 mg twice daily. - Rheumatoid Factor - ANA Direct w/Reflex if Positive - Sed Rate (ESR) - C-reactive protein - pregabalin (LYRICA) 150 MG capsule; Take 1 capsule (150 mg total) by mouth 2 (two) times daily.  Dispense: 60 capsule; Refill: 2 -  cyclobenzaprine (FLEXERIL) 10 MG tablet; Take 1 tablet (10 mg total) by mouth at bedtime. Take one tab po qhs for back  spasm prn only  Dispense: 30 tablet; Refill: 2 - methylPREDNISolone acetate (DEPO-MEDROL) injection 80 mg - predniSONE (STERAPRED UNI-PAK 21 TAB) 10 MG (21) TBPK tablet; Use as directed for 6 days  Dispense: 21 tablet; Refill: 0  3. Lumbar radicular pain Pregabalin dose increased 250 mg twice daily, refills of hydrocodone ordered, autoimmune labs ordered to rule out rheumatoid arthritic issues, Depo-Medrol injection of 60 mg administered in office today and a 6-day prednisone taper prescribed. - Rheumatoid Factor - ANA Direct w/Reflex if Positive - Sed Rate (ESR) - C-reactive protein - pregabalin (LYRICA) 150 MG capsule; Take 1 capsule (150 mg total) by mouth 2 (two) times daily.  Dispense: 60 capsule; Refill: 2 - cyclobenzaprine (FLEXERIL) 10 MG tablet; Take 1 tablet (10 mg total) by mouth at bedtime. Take one tab po qhs for back spasm prn only  Dispense: 30 tablet; Refill: 2 - HYDROcodone-acetaminophen (NORCO) 5-325 MG tablet; Take 1 tablet by mouth 2 (two) times daily as needed for moderate pain or severe pain.  Dispense: 60 tablet; Refill: 0 - HYDROcodone-acetaminophen (NORCO) 5-325 MG tablet; Take 1 tablet by mouth 2 (two) times daily as needed for moderate pain or severe pain.  Dispense: 60 tablet; Refill: 0 - methylPREDNISolone acetate (DEPO-MEDROL) injection 80 mg - predniSONE (STERAPRED UNI-PAK 21 TAB) 10 MG (21) TBPK tablet; Use as directed for 6 days  Dispense: 21 tablet; Refill: 0  4. History of recent fall Recent fall exacerbating back pain and causing right-sided rib pain.  5. Rib pain on right side Flexeril dose increased to 10 mg at bedtime to help alleviate musculoskeletal pain and back spasms and rib spasms, hydrocodone prescribed refills to help alleviate pain while she continues to heal, Depo-Medrol injection 60 mg administered in office today, and a 6-day prednisone taper prescribed to decrease inflammation and alleviate pain. - cyclobenzaprine (FLEXERIL) 10 MG tablet; Take  1 tablet (10 mg total) by mouth at bedtime. Take one tab po qhs for back spasm prn only  Dispense: 30 tablet; Refill: 2 - HYDROcodone-acetaminophen (NORCO) 5-325 MG tablet; Take 1 tablet by mouth 2 (two) times daily as needed for moderate pain or severe pain.  Dispense: 60 tablet; Refill: 0 - HYDROcodone-acetaminophen (NORCO) 5-325 MG tablet; Take 1 tablet by mouth 2 (two) times daily as needed for moderate pain or severe pain.  Dispense: 60 tablet; Refill: 0 - methylPREDNISolone acetate (DEPO-MEDROL) injection 80 mg - predniSONE (STERAPRED UNI-PAK 21 TAB) 10 MG (21) TBPK tablet; Use as directed for 6 days  Dispense: 21 tablet; Refill: 0  6. Prediabetes Continue metformin as prescribed, refills ordered. - metFORMIN (GLUCOPHAGE) 500 MG tablet; Take 1 tablet (500 mg total) by mouth 2 (two) times daily with a meal.  Dispense: 180 tablet; Refill: 3  7. Other insomnia Continue trazodone as prescribed, refills ordered.  Cyclobenzaprine dose increased and changed to 1 tab at bedtime with a couple of refills to help improve pain level at night and decrease muscle spasms so that she can sleep better. - cyclobenzaprine (FLEXERIL) 10 MG tablet; Take 1 tablet (10 mg total) by mouth at bedtime. Take one tab po qhs for back spasm prn only  Dispense: 30 tablet; Refill: 2 - traZODone (DESYREL) 50 MG tablet; Take 1-2 tablets (50-100 mg total) by mouth at bedtime as needed for sleep.  Dispense: 180 tablet; Refill: 3  8. Mixed hyperlipidemia Continue rosuvastatin,  refills ordered - rosuvastatin (CRESTOR) 5 MG tablet; Take 1 tablet (5 mg total) by mouth daily.  Dispense: 90 tablet; Refill: 3  9. Depression, major, single episode, moderate (HCC) Continue sertraline as prescribed, refills ordered - sertraline (ZOLOFT) 100 MG tablet; Take 1.5 tablets (150 mg total) by mouth daily.  Dispense: 135 tablet; Refill: 1  10. GAD (generalized anxiety disorder) Anxiety remains controlled with current medications, refills  ordered and sent to pharmacy.  Continue as prescribed - busPIRone (BUSPAR) 30 MG tablet; Take 1 tablet (30 mg total) by mouth 2 (two) times daily.  Dispense: 180 tablet; Refill: 1 - sertraline (ZOLOFT) 100 MG tablet; Take 1.5 tablets (150 mg total) by mouth daily.  Dispense: 135 tablet; Refill: 1 - ALPRAZolam (XANAX) 0.5 MG tablet; Take 1 tablet (0.5 mg total) by mouth 3 (three) times daily as needed for anxiety or sleep.  Dispense: 90 tablet; Refill: 1   General Counseling: Fatou verbalizes understanding of the findings of todays visit and agrees with plan of treatment. I have discussed any further diagnostic evaluation that may be needed or ordered today. We also reviewed her medications today. she has been encouraged to call the office with any questions or concerns that should arise related to todays visit.    Orders Placed This Encounter  Procedures   Rheumatoid Factor   ANA Direct w/Reflex if Positive   Sed Rate (ESR)   C-reactive protein    Meds ordered this encounter  Medications   pregabalin (LYRICA) 150 MG capsule    Sig: Take 1 capsule (150 mg total) by mouth 2 (two) times daily.    Dispense:  60 capsule    Refill:  2   cyclobenzaprine (FLEXERIL) 10 MG tablet    Sig: Take 1 tablet (10 mg total) by mouth at bedtime. Take one tab po qhs for back spasm prn only    Dispense:  30 tablet    Refill:  2   busPIRone (BUSPAR) 30 MG tablet    Sig: Take 1 tablet (30 mg total) by mouth 2 (two) times daily.    Dispense:  180 tablet    Refill:  1   sertraline (ZOLOFT) 100 MG tablet    Sig: Take 1.5 tablets (150 mg total) by mouth daily.    Dispense:  135 tablet    Refill:  1   ALPRAZolam (XANAX) 0.5 MG tablet    Sig: Take 1 tablet (0.5 mg total) by mouth 3 (three) times daily as needed for anxiety or sleep.    Dispense:  90 tablet    Refill:  1    Refills for July and august   rosuvastatin (CRESTOR) 5 MG tablet    Sig: Take 1 tablet (5 mg total) by mouth daily.    Dispense:   90 tablet    Refill:  3   metFORMIN (GLUCOPHAGE) 500 MG tablet    Sig: Take 1 tablet (500 mg total) by mouth 2 (two) times daily with a meal.    Dispense:  180 tablet    Refill:  3   traZODone (DESYREL) 50 MG tablet    Sig: Take 1-2 tablets (50-100 mg total) by mouth at bedtime as needed for sleep.    Dispense:  180 tablet    Refill:  3   HYDROcodone-acetaminophen (NORCO) 5-325 MG tablet    Sig: Take 1 tablet by mouth 2 (two) times daily as needed for moderate pain or severe pain.    Dispense:  60 tablet  Refill:  0    Reorder recent initial prescription for twice daily prn. Please fill today thanks.   HYDROcodone-acetaminophen (NORCO) 5-325 MG tablet    Sig: Take 1 tablet by mouth 2 (two) times daily as needed for moderate pain or severe pain.    Dispense:  60 tablet    Refill:  0    Fill for July if needed.   methylPREDNISolone acetate (DEPO-MEDROL) injection 80 mg   predniSONE (STERAPRED UNI-PAK 21 TAB) 10 MG (21) TBPK tablet    Sig: Use as directed for 6 days    Dispense:  21 tablet    Refill:  0    Please fill today, patient received depo medrol injection in office today.    Return in about 3 months (around 10/19/2021) for F/U, med refill, Mea Ozga PCP.   Total time spent:30 Minutes Time spent includes review of chart, medications, test results, and follow up plan with the patient.   [place] Controlled Substance Database was reviewed by me.  This patient was seen by Jonetta Osgood, FNP-C in collaboration with Dr. Clayborn Bigness as a part of collaborative care agreement.   Lateefah Mallery R. Valetta Fuller, MSN, FNP-C Internal medicine

## 2021-07-20 ENCOUNTER — Ambulatory Visit: Payer: Medicaid Other | Admitting: Internal Medicine

## 2021-07-20 ENCOUNTER — Encounter: Payer: Self-pay | Admitting: Internal Medicine

## 2021-07-20 VITALS — BP 110/80 | HR 92 | Temp 98.4°F | Resp 16 | Ht 62.0 in | Wt 207.2 lb

## 2021-07-20 DIAGNOSIS — G4733 Obstructive sleep apnea (adult) (pediatric): Secondary | ICD-10-CM | POA: Diagnosis not present

## 2021-07-20 DIAGNOSIS — J449 Chronic obstructive pulmonary disease, unspecified: Secondary | ICD-10-CM | POA: Diagnosis not present

## 2021-07-20 DIAGNOSIS — F17200 Nicotine dependence, unspecified, uncomplicated: Secondary | ICD-10-CM | POA: Diagnosis not present

## 2021-07-20 NOTE — Progress Notes (Signed)
[facility] [place], [place] [address]  Pulmonary Sleep Medicine   Office Visit Note  Patient Name: Margaret Blackburn DOB: [birth year] MRN [id number]  Date of Service: 07/20/2021  Complaints/HPI: She has not yet had the sleep study due to insurance approval. She is supposed to have this done soon. Still smoking. She has been a 1.5ppd smoker. She has some SOB noted. Denies having chest pain no fevers or chills. Notes cough at times. She does not have any recent admissions to the hospital.   ROS  General: (-) fever, (-) chills, (-) night sweats, (-) weakness Skin: (-) rashes, (-) itching,. Eyes: (-) visual changes, (-) redness, (-) itching. Nose and Sinuses: (-) nasal stuffiness or itchiness, (-) postnasal drip, (-) nosebleeds, (-) sinus trouble. Mouth and Throat: (-) sore throat, (-) hoarseness. Neck: (-) swollen glands, (-) enlarged thyroid, (-) neck pain. Respiratory: + cough, (-) bloody sputum, + shortness of breath, + wheezing. Cardiovascular: - ankle swelling, (-) chest pain. Lymphatic: (-) lymph node enlargement. Neurologic: (-) numbness, (-) tingling. Psychiatric: (-) anxiety, (-) depression   Current Medication: Outpatient Encounter Medications as of 07/20/2021  Medication Sig Note   albuterol (VENTOLIN HFA) 108 (90 Base) MCG/ACT inhaler Inhale 2 puffs into the lungs every 6 (six) hours as needed for wheezing or shortness of breath.    ALPRAZolam (XANAX) 0.5 MG tablet Take 1 tablet (0.5 mg total) by mouth 3 (three) times daily as needed for anxiety or sleep.    anastrozole (ARIMIDEX) 1 MG tablet TAKE 1 TABLET BY MOUTH ONCE A DAY    aspirin EC 81 MG tablet Take 81 mg by mouth daily. Swallow whole.    azelastine (ASTELIN) 0.1 % nasal spray Place 1 spray into both nostrils 2 (two) times daily. Use in each nostril as directed    busPIRone (BUSPAR) 30 MG tablet Take 1 tablet (30 mg total) by mouth 2 (two) times daily.    Calcium Carb-Cholecalciferol  (CALCIUM 600+D3 PO) Take 1 tablet by mouth in the morning and at bedtime. 08/25/2019: Pt does not know dose of vit d 3   cyclobenzaprine (FLEXERIL) 10 MG tablet Take 1 tablet (10 mg total) by mouth at bedtime. Take one tab po qhs for back spasm prn only    Fluticasone-Umeclidin-Vilant (TRELEGY ELLIPTA) 100-62.5-25 MCG/ACT AEPB Inhale 1 puff into the lungs daily.    HYDROcodone-acetaminophen (NORCO) 5-325 MG tablet Take 1 tablet by mouth 2 (two) times daily as needed for moderate pain or severe pain.    [START ON 08/16/2021] HYDROcodone-acetaminophen (NORCO) 5-325 MG tablet Take 1 tablet by mouth 2 (two) times daily as needed for moderate pain or severe pain.    ipratropium-albuterol (DUONEB) 0.5-2.5 (3) MG/3ML SOLN Take 3 mLs by nebulization every 6 (six) hours as needed.    levocetirizine (XYZAL) 5 MG tablet Take 1 tablet (5 mg total) by mouth every evening.    metFORMIN (GLUCOPHAGE) 500 MG tablet Take 1 tablet (500 mg total) by mouth 2 (two) times daily with a meal.    montelukast (SINGULAIR) 10 MG tablet TAKE 1 TABLET BY MOUTH EVERY NIGHT AT BEDTIME    Multiple Vitamins-Minerals (ALIVE WOMENS ENERGY PO) Take 1 Dose by mouth daily.    predniSONE (STERAPRED UNI-PAK 21 TAB) 10 MG (21) TBPK tablet Use as directed for 6 days    pregabalin (LYRICA) 150 MG capsule Take 1 capsule (150 mg total) by mouth 2 (two) times daily.    rosuvastatin (CRESTOR) 5 MG tablet Take 1 tablet (  5 mg total) by mouth daily.    sertraline (ZOLOFT) 100 MG tablet Take 1.5 tablets (150 mg total) by mouth daily.    traZODone (DESYREL) 50 MG tablet Take 1-2 tablets (50-100 mg total) by mouth at bedtime as needed for sleep.    No facility-administered encounter medications on file as of 07/20/2021.    Surgical History: Past Surgical History:  Procedure Laterality Date   ABDOMINAL HYSTERECTOMY     BACK SURGERY     BREAST BIOPSY Right 12/25/2017   affirm bx , x clip,TUBULAR  CARCINOMA   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/03/2018    tubular carcinoma   BREAST LUMPECTOMY WITH NEEDLE LOCALIZATION Right 03/03/2018   Procedure: BREAST LUMPECTOMY WITH NEEDLE LOCALIZATION;  Surgeon: Vickie Epley, MD;  Location: [facility] ORS;  Service: General;  Laterality: Right;   COLONOSCOPY WITH PROPOFOL N/A 08/31/2019   Procedure: COLONOSCOPY WITH PROPOFOL;  Surgeon: Jonathon Bellows, MD;  Location: [facility] ENDOSCOPY;  Service: Gastroenterology;  Laterality: N/A;   SENTINEL NODE BIOPSY Right 03/21/2018   Procedure: RIGHT SENTINEL LYMPH  NODE BIOPSY;  Surgeon: Vickie Epley, MD;  Location: [facility] ORS;  Service: General;  Laterality: Right;    Medical History: Past Medical History:  Diagnosis Date   Allergy    Arthritis    Breast cancer ([place]) 12/2017   right breast   COPD (chronic obstructive pulmonary disease) ([place])    Depression    Personal history of radiation therapy    Prediabetes    Sleep apnea    supposed to get CPAP 03/25/19    Family History: Family History  Problem Relation Age of Onset   Breast cancer Mother 73   Lung cancer Mother    Breast cancer Maternal Grandmother    Thyroid cancer Father     Social History: Social History   Socioeconomic History   Marital status: Single    Spouse name: Not on file   Number of children: 2   Years of education: Not on file   Highest education level: Not on file  Occupational History   Not on file  Tobacco Use   Smoking status: Every Day    Packs/day: 2.00    Years: 39.00    Total pack years: 78.00    Types: Cigarettes, E-cigarettes   Smokeless tobacco: Never   Tobacco comments:    Smokes a pack and a half daily  Vaping Use   Vaping Use: Former   Devices: used  2 weeks per pt  Substance and Sexual Activity   Alcohol use: No   Drug use: Yes    Types: Marijuana    Comment: occ   Sexual activity: Yes  Other Topics Concern   Not on file  Social History Narrative   Not on file   Social Determinants of Health   Financial Resource Strain: Not on file  Food  Insecurity: Not on file  Transportation Needs: Not on file  Physical Activity: Not on file  Stress: Not on file  Social Connections: Not on file  Intimate Partner Violence: Not on file    Vital Signs: Blood pressure 110/80, pulse 92, temperature 98.4 F (36.9 C), resp. rate 16, height '5\' 2"'$  (1.575 m), weight 207 lb 3.2 oz (94 kg), SpO2 95 %.  Examination: General Appearance: The patient is well-developed, well-nourished, and in no distress. Skin: Gross inspection of skin unremarkable. Head: normocephalic, no gross deformities. Eyes: no gross deformities noted. ENT: ears appear grossly normal no exudates. Neck: Supple. No thyromegaly. No LAD.  Respiratory: few rhonchi noted at this time. Cardiovascular: Normal S1 and S2 without murmur or rub. Extremities: No cyanosis. pulses are equal. Neurologic: Alert and oriented. No involuntary movements.  LABS: No results found for this or any previous visit (from the past 2160 hour(s)).  Radiology: DG Chest 2 View  Result Date: 06/26/2021 CLINICAL DATA:  Right rib pain EXAM: CHEST - 2 VIEW COMPARISON:  06/10/2019 FINDINGS: Mild patchy density at the right lung base. No pleural effusion or pneumothorax. Normal heart size. Harrington rod. No displaced rib fracture. IMPRESSION: Mild atelectasis at the right lung base. Electronically Signed   By: Macy Mis M.D.   On: 06/26/2021 15:47    No results found.  DG Chest 2 View  Result Date: 06/26/2021 CLINICAL DATA:  Right rib pain EXAM: CHEST - 2 VIEW COMPARISON:  06/10/2019 FINDINGS: Mild patchy density at the right lung base. No pleural effusion or pneumothorax. Normal heart size. Harrington rod. No displaced rib fracture. IMPRESSION: Mild atelectasis at the right lung base. Electronically Signed   By: Macy Mis M.D.   On: 06/26/2021 15:47      Assessment and Plan: Patient Active Problem List   Diagnosis Date Noted   Lumbar radicular pain 12/08/2019   History of thoracic spinal  fusion (T4 to L1) 10/27/2019   Lumbar facet arthropathy 10/27/2019   Trochanteric bursitis of both hips 10/27/2019   Chronic pain syndrome 10/27/2019   Respiratory tract infection due to COVID-19 virus 03/27/2019   Cough 03/27/2019   Invasive carcinoma of breast ([place]) 03/24/2018   Goals of care, counseling/discussion 03/24/2018   Malignant neoplasm of right breast greater than or equal to 2 cm in greatest dimension [facility])    Mass of right breast 02/24/2018    1. Obstructive chronic bronchitis without exacerbation ([place]) Still with some symptoms on trelegy. I gave her samples of Breztri and will see how she does with that. Patient has been still smoking and needs to stop smoking  2. OSA (obstructive sleep apnea) High probability of obstructive sleep apnea supposed to have her sleep study done we will wait for approval from the insurance company to determine whether will be a in lab versus in-house study obviously in lab would be better for diagnostic purposes  3. Obesity, morbid ([place]) Obesity Counseling: Had a lengthy discussion regarding patients BMI and weight issues. Patient was instructed on portion control as well as increased activity. Also discussed caloric restrictions with trying to maintain intake less than 2000 Kcal. Discussions were made in accordance with the 5As of weight management. Simple actions such as not eating late and if able to, taking a walk is suggested.  4. Smoking cessation instruction/counseling given:  counseled patient on the dangers of tobacco use, advised patient to stop smoking, and reviewed strategies to maximize success    General Counseling: I have discussed the findings of the evaluation and examination with [facility].  I have also discussed any further diagnostic evaluation thatmay be needed or ordered today. Donasia verbalizes understanding of the findings of todays visit. We also reviewed her medications today and discussed drug interactions and side effects  including but not limited excessive drowsiness and altered mental states. We also discussed that there is always a risk not just to her but also people around her. she has been encouraged to call the office with any questions or concerns that should arise related to todays visit.  No orders of the defined types were placed in this encounter.    Time spent:  39  I have personally obtained a history, examined the patient, evaluated laboratory and imaging results, formulated the assessment and plan and placed orders.    Allyne Gee, MD [facility] Pulmonary and Critical Care Sleep medicine

## 2021-07-20 NOTE — Patient Instructions (Signed)

## 2021-07-21 NOTE — Telephone Encounter (Signed)
S/w Hildred Alamin w/ [facility]. She will send message for someone to call patient to schedule-Toni

## 2021-07-23 ENCOUNTER — Telehealth: Payer: Self-pay

## 2021-07-23 NOTE — Telephone Encounter (Signed)
PA sent for HYDROCODONE-ACETAMINOPHEN 5-325 mg tablets 07/23/21 @ 10:29 pm

## 2021-07-25 ENCOUNTER — Telehealth: Payer: Self-pay

## 2021-07-25 NOTE — Telephone Encounter (Signed)
Patient scheduled for PSG on 07/29/21 @ [facility]tat

## 2021-07-26 ENCOUNTER — Encounter (INDEPENDENT_AMBULATORY_CARE_PROVIDER_SITE_OTHER): Payer: Medicaid Other | Admitting: Internal Medicine

## 2021-07-26 ENCOUNTER — Encounter: Payer: Self-pay | Admitting: Internal Medicine

## 2021-07-26 DIAGNOSIS — G4719 Other hypersomnia: Secondary | ICD-10-CM

## 2021-07-27 ENCOUNTER — Telehealth: Payer: Self-pay

## 2021-07-27 NOTE — Telephone Encounter (Signed)
PA for HYDROCODONE-ACETAMINOPHEN 5-325 mg tablets denied. Spoke to pt, she is aware. Pt said it was ok and she did not want to get a list of preferred meds from insurance company.

## 2021-08-04 NOTE — Procedures (Signed)
[place] Report Part I                                                                 Phone: [phone] Fax: [phone]  Patient Name: Margaret, Blackburn Acquisition Number: [id number]  Date of Birth: [birth year] Acquisition Date: 07/26/2021  Referring Physician: Drema Dallas, PA-C     History: The patient is a 56 year old female who was referred for evaluation of possible sleep apnea. Medical History: arthritis, breast cancer, COPD, depression, prediabetes, OSA.  Medications: albuterol, alprazolam, anastrozole, aspirin, azelastine, buspirone, calcium carb-cholecalciferol, fluticasone-umeclidin-vilant, hydrocodone-acetaminophen, ipratropium-albuterol, levocetirizine, metformin, montelukast, multi-vitamin, pregabalin, rosuvastatin, sertraline, trazodone.  Procedure: This routine overnight polysomnogram was performed on the [place] 4 or 5 using the standard diagnostic protocol. This included 6 channels of EEG, 2 channels of EOG, chin EMG, bilateral anterior tibialis EMG, nasal/oral thermister, PTAF (nasal pressure transducer), chest and abdominal wall movements, EKG, pulse oximetry and EtCO2 when appropriate.  Description: The total recording time was 400.1 minutes. The total sleep time was 391.5 minutes. There were a total of 8.1 minutes of wakefulness after sleep onset for a very goodsleep efficiency of 97.9%. The latency to sleep onset was short at 0.5 minutes. The R sleep onset latency was prolonged at 270.5 minutes. Sleep parameters, as a percentage of the total sleep time, demonstrated 4.0% of sleep was in N1 sleep, 48.1% N2, 33.8% N3 and 14.0% R sleep. There were a total of 171 arousals for an arousal index of 26.2 arousals per hour of sleep that was elevated.  Respiratory monitoring demonstrated nearly continuous severe degree of snoring in any position. There were 218 apneas and hypopneas for an Apnea Hypopnea Index of 33.4 apneas and hypopneas per hour of  sleep. The REM related apnea hypopnea index was 44.7/hr of REM sleep compared to a NREM AHI of 31.4/hr. The Respiratory Disturbance Index, which includes 5 respiratory effort related arousals (RERAs), was 34.2 respiratory events per hour of sleep.  The average duration of the respiratory events was 40.0 seconds with a maximum duration of 122.0 seconds. The respiratory events occurred in most positions. The respiratory events were associated with peripheral oxygen desaturations on the average to 85%. The lowest oxygen desaturation associated with a respiratory event was 76%. Additionally, the baseline oxygen saturation during wakefulness was 93%, during NREM sleep averaged 92%, and during REM sleep averaged  90%. The total duration of oxygen < 90% was 80.7 minutes and <80% was 0.8 minutes.  Cardiac monitoring- did not demonstrate transient cardiac decelerations associated with the apneas. There were no significant cardiac rhythm irregularities.   Periodic limb movement monitoring- demonstrated that there were 10 periodic limb movements for a periodic limb movement index of 1.5 periodic limb movements per hour of sleep.   Impression: This routine overnight polysomnogram demonstrated significant obstructive sleep apnea with an overall Apnea Hypopnea Index of 33.4 apneas and hypopneas per hour of sleep, while the respiratory disturbance index, which includes RERAs, was 34.2/hr. The respiratory events were associated with peripheral oxygen desaturations on the average to 85% with the lowest desaturation to 76%.       There was a  elevated arousal indexfailure to progress into the deeper stages of sleepThese findings would appear to be due to  the obstructive sleep apnea.  Recommendations:  CPAP titration study is indicated for the patients OSA Nasal Decongestants and antihistamines may be of help for increased upper airway resistance. Weight loss through dietary and lifestyle modifications to include  exercise is recommended in the presence of obesity. Alternative treatment options if the patient is not willing or is unable to use CPAP include oral appliances as well as surgical intervention in the right clinical setting. Clinical correlation is recommended. Please feel free to call the office for any further questions or assistance in the care of this patient.    Allyne Gee, MD [facility] Diplomate ABMS Pulmonary Critical Care Medicine Sleep Medicine Electronically reviewed and digitally signed  [place] Report Part II  Phone: [phone] Fax: [phone]  Patient last name Blackburn Neck Size 15.5 in. Acquisition [id number]  Patient first name Margaret Weight 209.0 lbs. Started 07/26/2021 at 10:13:52 PM  Birth date 1965-03-23 Height 62.0 in. Stopped 07/27/2021 at 5:20:28 AM  Age 36 BMI 38.2 lb/in2 Duration 400.1  Study Type Adult      Rod Holler, RPSGT/Anna Bethel Sleep Data: Lights Out: 10:36:22 PM Sleep Onset: 10:36:52 PM  Lights On: 5:16:28 AM Sleep Efficiency: 97.9 %  Total Recording Time: 400.1 min Sleep Latency (from Lights Off) 0.5 min  Total Sleep Time (TST): 391.5 min R Latency (from Sleep Onset): 270.5 min  Sleep Period Time: 399.5 min Total number of awakenings: 9  Wake during sleep: 8.0 min Wake After Sleep Onset (WASO): 8.1 min   Sleep Data:         Arousal Summary: Stage  Latency from lights out (min) Latency from sleep onset (min) Duration (min) % Total Sleep Time  Normal values  N 1 0.5 0.0 15.5 4.0 (5%)  N 2 3.5 3.0 188.5 48.1 (50%)  N 3 90.5 90.0 132.5 33.8 (20%)  R 271.0 270.5 55.0 14.0 (25%)    Number Index  Spontaneous 24 3.7  Apneas & Hypopneas 147 22.5  RERAs 5 0.8       (Apneas & Hypopneas & RERAs)  (152) (23.3)  Limb Movement 1 0.2  Snore 0 0.0  TOTAL 177 27.1     Respiratory Data:  CA OA MA Apnea Hypopnea* A+ H RERA Total  Number 0 21 0 21 197 218 5 223  Mean Dur (sec) 0.0 25.1 0.0 25.1 41.9 40.3 26.2 40.0   Max Dur (sec) 0.0 41.0 0.0 41.0 122.0 122.0 46.0 122.0  Total Dur (min) 0.0 8.8 0.0 8.8 137.5 146.3 2.2 148.5  % of TST 0.0 2.2 0.0 2.2 35.1 37.4 0.6 37.9  Index (#/h TST) 0.0 3.2 0.0 3.2 30.2 33.4 0.8 34.2  *Hypopneas scored based on 4% or greater desaturation.  Sleep Stage:     Body Position Data:   REM NREM TST  AHI 44.7 31.4 33.4  RDI 44.7 32.3 34.2         Sleep (min) TST (%) REM (min) NREM (min) CA (#) OA (#) MA (#) HYP (#) AHI (#/h) RERA (#) RDI (#/h) Desat (#)  Supine 134.5 34.36 55.0 79.5 0 1 0 49 22.3 0 22.3 55  Non-Supine 257.00 65.64 0.00 257.00 0.00 20.00 0.00 148.00 39.22 5.00 40.39 200.00  Left: 257.0 65.64 0.0 257.0 0 20 0 148 39.2 5 40.4 200       Snoring: Total number of snoring episodes  0  Total time with snoring    min (   % of sleep)   Oximetry  Distribution:             WK REM NREM TOTAL  Average (%)   93 90 92 92  < 90% 0.9 18.7 61.1 80.7  < 80% 0.0 0.0 0.8 0.8  < 70% 0.0 0.0 0.0 0.0  # of Desaturations* 2 44 209 255  Desat Index (#/hour) 14.0 48.0 37.3 39.1  Desat Max (%) '5 13 20 20  '$ Desat Max Dur (sec) 31.0 87.0 117.0 117.0  Approx Min O2 during sleep 76  Approx min O2 during a respiratory event 76  Was Oxygen added (Y/N) and final rate No:   0 LPM  *Desaturations based on 3% or greater drop from baseline.   Cheyne Stokes Breathing: None Present   Hypoventilation: None Present    Heart Rate Summary:  Average Heart Rate During Sleep 80.8 bpm      Highest Heart Rate During Sleep (95th %) 90.0 bpm      Highest Heart Rate During Sleep 109 bpm      Highest Heart Rate During Recording (TIB) 144 bpm       Heart Rate Observations: Event Type # Events   Bradycardia 0 Lowest HR Scored: N/A  Sinus Tachycardia During Sleep 0 Highest HR Scored: N/A  Narrow Complex Tachycardia 0 Highest HR Scored: N/A  Wide Complex Tachycardia 0 Highest HR Scored: N/A  Asystole 0 Longest Pause: N/A  Atrial Fibrillation 0 Duration Longest Event: N/A   Other Arrythmias  No Type:    Periodic Limb Movement Data: (Primary legs unless otherwise noted) Total # Limb Movement 10 Limb Movement Index 1.5  Total # PLMS 10 PLMS Index 1.5  Total # PLMS Arousals 1 PLMS Arousal Index 0.2  Percentage Sleep Time with PLMS 7.57mn (1.9 % sleep)  Mean Duration limb movements (secs) 218.5

## 2021-08-15 ENCOUNTER — Other Ambulatory Visit: Payer: Self-pay | Admitting: Nurse Practitioner

## 2021-08-15 DIAGNOSIS — J449 Chronic obstructive pulmonary disease, unspecified: Secondary | ICD-10-CM

## 2021-08-22 ENCOUNTER — Telehealth: Payer: Self-pay

## 2021-08-22 ENCOUNTER — Other Ambulatory Visit: Payer: Self-pay | Admitting: Internal Medicine

## 2021-08-22 NOTE — Telephone Encounter (Signed)
Please check with me

## 2021-08-23 NOTE — Telephone Encounter (Signed)
Spoke with pt and made her appt with [facility] next week

## 2021-08-23 NOTE — Telephone Encounter (Signed)
Lmom to call back need appt with [facility] next Tuesday to refill her med

## 2021-08-26 ENCOUNTER — Encounter (INDEPENDENT_AMBULATORY_CARE_PROVIDER_SITE_OTHER): Payer: Medicaid Other | Admitting: Internal Medicine

## 2021-08-26 DIAGNOSIS — G4733 Obstructive sleep apnea (adult) (pediatric): Secondary | ICD-10-CM | POA: Diagnosis not present

## 2021-08-29 ENCOUNTER — Ambulatory Visit: Payer: Medicaid Other | Admitting: Internal Medicine

## 2021-09-01 ENCOUNTER — Telehealth: Payer: Self-pay

## 2021-09-01 NOTE — Procedures (Signed)
[place] Report Part I  Phone: [phone] Fax: [phone]  Patient Name: Margaret Blackburn, Margaret Blackburn Acquisition Number: [id number]  Date of Birth: [birth year] Acquisition Date: 08/26/2021  Referring Physician: Drema Dallas, PA-C     History: The patient is a 56 year old female with obstructive sleep apnea for CPAP titration. Medical History: arthritis, breast cancer, COPD, depression, prediabetes and OSA.  Medications: albuterol, alprazolam, anastrozole, aspirin, azelastine, buspirone, calcium carb-cholecalciferol, fluticasone-umeclidin-vilant, hydrocodone-acetaminophen, ipratropium-albuterol, levocetrizine, metformin, montelukast, multi-vitamin, pregabalin, rosuvastatin, sertraline and trazodone.  Procedure: This routine overnight polysomnogram was performed on the [facility] 5 using the standard CPAP protocol. This included 6 channels of EEG, 2 channels of EOG, chin EMG, bilateral anterior tibialis EMG, nasal/oral thermistor, PTAF (nasal pressure transducer), chest and abdominal wall movements, EKG, and pulse oximetry.  Description: The total recording time was 362.0 minutes. The total sleep time was 326.5 minutes. There were a total of 32.7 minutes of wakefulness after sleep onset for a slightly reducedsleep efficiency of 90.2%. The latency to sleep onset was shortat 2.8 minutes. The R sleep onset latency was within normal limits at 116.5 minutes. Sleep parameters, as a percentage of the total sleep time, demonstrated 18.5% of sleep was in N1 sleep, 31.4% N2, 17.5% N3 and 32.6% R sleep. There were a total of 135 arousals for an arousal index of 24.8 arousals per hour of sleep that was elevated.  Overall, there were a total of 15 respiratory events for a respiratory disturbance index, which includes apneas, hypopneas and RERAs (increased respiratory effort) of 2.8 respiratory events per hour of sleep during the pressure titration. CPAP was initiated at 4 cm H2O at  lights out, 12:08 a.m. It was titrated in 1-2 cm increments for intermittent respiratory events to the final pressure of 11 cm H2O. The apnea was well controlled at the final pressure and supine, REM sleep was observed.   Additionally, the baseline oxygen saturation during wakefulness was 96%, during NREM sleep averaged 95%, and during REM sleep averaged 97%. The total duration of oxygen < 90% was 0.9 minutes.  Cardiac monitoring- There were no significant cardiac rhythm irregularities.   Periodic limb movement monitoring- demonstrated that there were 43 periodic limb movements for a periodic limb movement index of 7.9 periodic limb movements per hour of sleep.   Impression: This patient's obstructive sleep apnea demonstrated significant improvement with the utilization of nasal CPAP at 11 cmH2O.   There were few periodic limb movements that commonly are not significant. Clinical correlation would be suggested.   Recommendations: Would recommend utilization of nasal CPAP at 11 cm H2O.      A small ResMed Quattro Mirage Full mask was used. Chin strap used during study- no. Humidifier used during study- yes.     Margaret Gee, MD, [facility] Diplomate ABMS-Pulmonary, Critical Care and Sleep Medicine  Electronically reviewed and digitally signed    [place] CPAP/BIPAP Polysomnogram Report Part II Phone: [phone] Fax: [phone]  Patient last name [facility] Neck Size 15.5 in. Acquisition [id number]  Patient first name Sennie Weight 209.0 lbs. Started 08/26/2021 at 11:59:09 PM  Birth date Jun 24, 1965 Height 62.0 in. Stopped 08/27/2021 at 7:08:21 AM  Age 62      Type Adult BMI 38.2 lb/in2 Duration 362.0  Report Generated by Margaret Blackburn, RPSGT  Reviewed by: Margaret Ito. Henke, PhD, ABSM, FAASM Sleep Data: Lights Out: 12:08:51 AM Sleep Onset: 12:11:39 AM  Lights On: 6:10:51 AM Sleep Efficiency: 90.2 %  Total Recording Time: 362.0 min Sleep Latency (from Lights Off) 2.8 min   Total Sleep Time (TST): 326.5 min R Latency (from Sleep Onset): 116.5 min  Sleep Period Time: 359.0 min Total number of awakenings: 28  Wake during sleep: 32.5 min Wake After Sleep Onset (WASO): 32.7 min   Sleep Data:         Arousal Summary: Stage  Latency from lights out (min) Latency from sleep onset (min) Duration (min) % Total Sleep Time  Normal values  N 1 2.8 0.0 60.5 18.5 (5%)  N 2 3.8 1.0 102.5 31.4 (50%)  N 3 22.3 19.5 57.0 17.5 (20%)  R 119.3 116.5 106.5 32.6 (25%)    Number Index  Spontaneous 91 16.7  Apneas & Hypopneas 9 1.7  RERAs 0 0.0       (Apneas & Hypopneas & RERAs)  (9) (1.7)  Limb Movement 35 6.4  Snore 0 0.0  TOTAL 135 24.8     Respiratory Data:  CA OA MA Apnea Hypopnea* A+ H RERA Total  Number 0 '1 6 7 8 15 '$ 0 15  Mean Dur (sec) 0.0 10.5 17.7 16.6 25.6 21.4 0.0 21.4  Max Dur (sec) 0.0 10.5 24.5 24.5 37.0 37.0 0.0 37.0  Total Dur (min) 0.0 0.2 1.8 1.9 3.4 5.3 0.0 5.3  % of TST 0.0 0.1 0.5 0.6 1.0 1.6 0.0 1.6  Index (#/h TST) 0.0 0.2 1.1 1.3 1.5 2.8 0.0 2.8  *Hypopneas scored based on 4% or greater desaturation.  Sleep Stage:         REM NREM TST  AHI 2.8 2.7 2.8  RDI 2.8 2.7 2.8    Sleep (min) TST (%) REM (min) NREM (min) CA (#) OA (#) MA (#) HYP (#) AHI (#/h) RERA (#) RDI (#/h) Desat (#)  Supine 173.5 53.14 70.5 103.0 0 '1 5 5 '$ 3.8 0 3.8 11  Non-Supine 153.00 46.86 36.00 117.00 0.00 0.00 1.00 3.00 1.57 0 1.57 4.00  Left: 153.0 46.86 36.0 117.0 0 0 1 3 1.6 0 1.6 4     Snoring: Total number of snoring episodes  0  Total time with snoring    min (   % of sleep)   Oximetry Distribution:             WK REM NREM TOTAL  Average (%)   96 97 95 95  < 90% 0.6 0.0 0.3 0.9  < 80% 0.0 0.0 0.0 0.0  < 70% 0.0 0.0 0.0 0.0  # of Desaturations* '2 3 6 11  '$ Desat Index (#/hour) 3.4 1.7 1.6 2.0  Desat Max (%) '8 5 10 10  '$ Desat Max Dur (sec) 12.0 82.0 75.0 82.0  Approx Min O2 during sleep 85  Approx min O2 during a respiratory event 84  Was  Oxygen added (Y/N) and final rate No:   0 LPM  *Desaturations based on 4% or greater drop from baseline.   Cheyne Stokes Breathing: None Present    Heart Rate Summary:  Average Heart Rate During Sleep 56.8 bpm      Highest Heart Rate During Sleep (95th %) 136.3 bpm      Highest Heart Rate During Sleep 255 bpm (artifact)  Highest Heart Rate During Recording (TIB) 255 bpm (artifact)   Heart Rate Observations: Event Type # Events   Bradycardia 0 Lowest HR Scored: N/A  Sinus Tachycardia During Sleep 0 Highest HR Scored: N/A  Narrow Complex Tachycardia 0 Highest HR Scored: N/A  Wide Complex Tachycardia 0  Highest HR Scored: N/A  Asystole 0 Longest Pause: N/A  Atrial Fibrillation 0 Duration Longest Event: N/A  Other Arrythmias  No Type:   Periodic Limb Movement Data: (Primary legs unless otherwise noted) Total # Limb Movement 74 Limb Movement Index 13.6  Total # PLMS 43 PLMS Index 7.9  Total # PLMS Arousals 19 PLMS Arousal Index 3.5  Percentage Sleep Time with PLMS 22.71mn (6.8 % sleep)  Mean Duration limb movements (secs) 268.3    IPAP Level (cmH2O) EPAP Level (cmH2O) Total Duration (min) Sleep Duration (min) Sleep (%) REM (%) CA  #) OA # MA # HYP #) AHI (#/hr) RERAs # RERAs (#/hr) RDI (#/hr)  4 4 55.3 55.3 100.0 0.0 0 0 0 1 1.1 0 0.0 1.'1  5 5 '$ 5.2 5.2 100.0 0.0 0 0 0 0 0.0 0 0.0 0.0  6 6 20.1 19.6 97.5 0.0 0 0 1 1 6.1 0 0.0 6.'1  7 7 '$ 10.2 10.2 100.0 0.0 0 0 0 0 0.0 0 0.0 0.0  8 8 144.9 118.4 81.7 24.8 0 0 1 5 3.0 0 0.0 3.0  9 9 72.3 68.3 94.5 36.4 0 '1 3 1 '$ 4.4 0 0.0 4.'4  11 11 '$ 48.9 47.4 96.9 89.8 0 0 1 0 1.3 0 0.0 1.3

## 2021-09-01 NOTE — Telephone Encounter (Signed)
CPAP order received. Put on Lauren's desk for signature-Toni

## 2021-09-04 ENCOUNTER — Telehealth: Payer: Self-pay

## 2021-09-04 NOTE — Telephone Encounter (Signed)
CPAP order signed and faxed back to [facility] [phone]

## 2021-09-28 ENCOUNTER — Ambulatory Visit: Payer: Medicaid Other | Admitting: Internal Medicine

## 2021-10-05 ENCOUNTER — Ambulatory Visit: Payer: Medicaid Other | Admitting: Physician Assistant

## 2021-10-05 ENCOUNTER — Encounter: Payer: Self-pay | Admitting: Physician Assistant

## 2021-10-05 VITALS — BP 102/79 | HR 118 | Temp 98.3°F | Resp 16 | Ht 62.0 in | Wt 201.0 lb

## 2021-10-05 DIAGNOSIS — R0602 Shortness of breath: Secondary | ICD-10-CM | POA: Diagnosis not present

## 2021-10-05 DIAGNOSIS — G4733 Obstructive sleep apnea (adult) (pediatric): Secondary | ICD-10-CM | POA: Diagnosis not present

## 2021-10-05 DIAGNOSIS — J449 Chronic obstructive pulmonary disease, unspecified: Secondary | ICD-10-CM | POA: Diagnosis not present

## 2021-10-05 DIAGNOSIS — E669 Obesity, unspecified: Secondary | ICD-10-CM

## 2021-10-05 NOTE — Progress Notes (Signed)
[facility] [place], [place] [address]  Pulmonary Sleep Medicine   Office Visit Note  Patient Name: Margaret Blackburn DOB: [birth year] MRN [id number]  Date of Service: 10/11/2021  Complaints/HPI: Pt is here for routine follow up. She recently had a PSG showing severe OSA with AHI of 33.4. She had a titration done and is recommended to start on cpap of 11cm H2O. An order for her cpap has already been placed and sent to [facility]. Her oxygen saturations did dip during PSG, but were normal on titration. Breathing has been stable. Arlyce Harman done in office was slightly improved from previous despite patient having difficulty performing test. She is due for Pft and will place order. Using inhalers as prescribed.  ROS  General: (-) fever, (-) chills, (-) night sweats, (-) weakness Skin: (-) rashes, (-) itching,. Eyes: (-) visual changes, (-) redness, (-) itching. Nose and Sinuses: (-) nasal stuffiness or itchiness, (-) postnasal drip, (-) nosebleeds, (-) sinus trouble. Mouth and Throat: (-) sore throat, (-) hoarseness. Neck: (-) swollen glands, (-) enlarged thyroid, (-) neck pain. Respiratory: + cough, (-) bloody sputum, + shortness of breath, + wheezing. Cardiovascular: - ankle swelling, (-) chest pain. Lymphatic: (-) lymph node enlargement. Neurologic: (-) numbness, (-) tingling. Psychiatric: (-) anxiety, (-) depression   Current Medication: Outpatient Encounter Medications as of 10/05/2021  Medication Sig Note   albuterol (VENTOLIN HFA) 108 (90 Base) MCG/ACT inhaler Inhale 2 puffs into the lungs every 6 (six) hours as needed for wheezing or shortness of breath.    ALPRAZolam (XANAX) 0.5 MG tablet Take 1 tablet (0.5 mg total) by mouth 3 (three) times daily as needed for anxiety or sleep.    anastrozole (ARIMIDEX) 1 MG tablet TAKE 1 TABLET BY MOUTH ONCE A DAY    aspirin EC 81 MG tablet Take 81 mg by mouth daily. Swallow whole.    azelastine (ASTELIN) 0.1 % nasal spray Place  1 spray into both nostrils 2 (two) times daily. Use in each nostril as directed    busPIRone (BUSPAR) 30 MG tablet Take 1 tablet (30 mg total) by mouth 2 (two) times daily.    Calcium Carb-Cholecalciferol (CALCIUM 600+D3 PO) Take 1 tablet by mouth in the morning and at bedtime. 08/25/2019: Pt does not know dose of vit d 3   cyclobenzaprine (FLEXERIL) 10 MG tablet Take 1 tablet (10 mg total) by mouth at bedtime. Take one tab po qhs for back spasm prn only    Fluticasone-Umeclidin-Vilant (TRELEGY ELLIPTA) 100-62.5-25 MCG/ACT AEPB Inhale 1 puff into the lungs daily.    HYDROcodone-acetaminophen (NORCO) 5-325 MG tablet Take 1 tablet by mouth 2 (two) times daily as needed for moderate pain or severe pain.    HYDROcodone-acetaminophen (NORCO) 5-325 MG tablet Take 1 tablet by mouth 2 (two) times daily as needed for moderate pain or severe pain.    ipratropium-albuterol (DUONEB) 0.5-2.5 (3) MG/3ML SOLN Take 3 mLs by nebulization every 6 (six) hours as needed.    levocetirizine (XYZAL) 5 MG tablet Take 1 tablet (5 mg total) by mouth every evening.    metFORMIN (GLUCOPHAGE) 500 MG tablet Take 1 tablet (500 mg total) by mouth 2 (two) times daily with a meal.    montelukast (SINGULAIR) 10 MG tablet TAKE 1 TABLET BY MOUTH EVERY NIGHT AT BEDTIME    Multiple Vitamins-Minerals (ALIVE WOMENS ENERGY PO) Take 1 Dose by mouth daily.    predniSONE (STERAPRED UNI-PAK 21 TAB) 10 MG (21) TBPK tablet Use as directed for 6  days    pregabalin (LYRICA) 150 MG capsule Take 1 capsule (150 mg total) by mouth 2 (two) times daily.    rosuvastatin (CRESTOR) 5 MG tablet Take 1 tablet (5 mg total) by mouth daily.    sertraline (ZOLOFT) 100 MG tablet Take 1.5 tablets (150 mg total) by mouth daily.    traZODone (DESYREL) 50 MG tablet Take 1-2 tablets (50-100 mg total) by mouth at bedtime as needed for sleep.    No facility-administered encounter medications on file as of 10/05/2021.    Surgical History: Past Surgical History:   Procedure Laterality Date   ABDOMINAL HYSTERECTOMY     BACK SURGERY     BREAST BIOPSY Right 12/25/2017   affirm bx , x clip,TUBULAR  CARCINOMA   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   BREAST LUMPECTOMY WITH NEEDLE LOCALIZATION Right 03/03/2018   Procedure: BREAST LUMPECTOMY WITH NEEDLE LOCALIZATION;  Surgeon: Vickie Epley, MD;  Location: [facility] ORS;  Service: General;  Laterality: Right;   COLONOSCOPY WITH PROPOFOL N/A 08/31/2019   Procedure: COLONOSCOPY WITH PROPOFOL;  Surgeon: Jonathon Bellows, MD;  Location: [facility] ENDOSCOPY;  Service: Gastroenterology;  Laterality: N/A;   SENTINEL NODE BIOPSY Right 03/21/2018   Procedure: RIGHT SENTINEL LYMPH  NODE BIOPSY;  Surgeon: Vickie Epley, MD;  Location: [facility] ORS;  Service: General;  Laterality: Right;    Medical History: Past Medical History:  Diagnosis Date   Allergy    Arthritis    Breast cancer ([place]) 12/2017   right breast   COPD (chronic obstructive pulmonary disease) ([place])    Depression    Personal history of radiation therapy    Prediabetes    Sleep apnea    supposed to get CPAP 03/25/19    Family History: Family History  Problem Relation Age of Onset   Breast cancer Mother 81   Lung cancer Mother    Breast cancer Maternal Grandmother    Thyroid cancer Father     Social History: Social History   Socioeconomic History   Marital status: Single    Spouse name: Not on file   Number of children: 2   Years of education: Not on file   Highest education level: Not on file  Occupational History   Not on file  Tobacco Use   Smoking status: Every Day    Packs/day: 2.00    Years: 39.00    Total pack years: 78.00    Types: Cigarettes, E-cigarettes   Smokeless tobacco: Never   Tobacco comments:    Smokes a pack and a half daily  Vaping Use   Vaping Use: Former   Devices: used  2 weeks per pt  Substance and Sexual Activity   Alcohol use: No   Drug use: Yes    Types: Marijuana    Comment: occ    Sexual activity: Yes  Other Topics Concern   Not on file  Social History Narrative   Not on file   Social Determinants of Health   Financial Resource Strain: Not on file  Food Insecurity: Not on file  Transportation Needs: Not on file  Physical Activity: Not on file  Stress: Not on file  Social Connections: Not on file  Intimate Partner Violence: Not on file    Vital Signs: Blood pressure 102/79, pulse (!) 118, temperature 98.3 F (36.8 C), resp. rate 16, height '5\' 2"'$  (1.575 m), weight 201 lb (91.2 kg), SpO2 96 %.  Examination: General Appearance: The patient is well-developed, well-nourished, and in  no distress. Skin: Gross inspection of skin unremarkable. Head: normocephalic, no gross deformities. Eyes: no gross deformities noted. ENT: ears appear grossly normal no exudates. Neck: Supple. No thyromegaly. No LAD. Respiratory: Lungs clear to auscultation bilaterally. Cardiovascular: Normal S1 and S2 without murmur or rub. Extremities: No cyanosis. pulses are equal. Neurologic: Alert and oriented. No involuntary movements.  LABS: No results found for this or any previous visit (from the past 2160 hour(s)).  Radiology: DG Chest 2 View  Result Date: 06/26/2021 CLINICAL DATA:  Right rib pain EXAM: CHEST - 2 VIEW COMPARISON:  06/10/2019 FINDINGS: Mild patchy density at the right lung base. No pleural effusion or pneumothorax. Normal heart size. Harrington rod. No displaced rib fracture. IMPRESSION: Mild atelectasis at the right lung base. Electronically Signed   By: Macy Mis M.D.   On: 06/26/2021 15:47    No results found.  No results found.    Assessment and Plan: Patient Active Problem List   Diagnosis Date Noted   Lumbar radicular pain 12/08/2019   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) 10/27/2019   Lumbar facet arthropathy 10/27/2019   Trochanteric bursitis of both hips 10/27/2019   Chronic pain syndrome 10/27/2019   Respiratory tract infection due  to COVID-19 virus 03/27/2019   Cough 03/27/2019   Invasive carcinoma of breast ([place]) 03/24/2018   Goals of care, counseling/discussion 03/24/2018   Malignant neoplasm of right breast greater than or equal to 2 cm in greatest dimension [facility])    Mass of right breast 02/24/2018    1. OSA (obstructive sleep apnea) Order for CPAP at 11 faxed to [facility], pt advised that she should receive a call for set up  2. Obstructive chronic bronchitis without exacerbation (HCC) Continue inhalers as prescribed and will update PFT - Pulmonary Function Test; Future  3. SOB (shortness of breath) - Spirometry with graph; Future - Spirometry with Graph - Pulmonary Function Test; Future  4. Obesity (BMI 30-39.9) Obesity Counseling: Had a lengthy discussion regarding patients BMI and weight issues. Patient was instructed on portion control as well as increased activity. Also discussed caloric restrictions with trying to maintain intake less than 2000 Kcal. Discussions were made in accordance with the 5As of weight management. Simple actions such as not eating late and if able to, taking a walk is suggested.    General Counseling: I have discussed the findings of the evaluation and examination with [facility].  I have also discussed any further diagnostic evaluation thatmay be needed or ordered today. Cyrena verbalizes understanding of the findings of todays visit. We also reviewed her medications today and discussed drug interactions and side effects including but not limited excessive drowsiness and altered mental states. We also discussed that there is always a risk not just to her but also people around her. she has been encouraged to call the office with any questions or concerns that should arise related to todays visit.  Orders Placed This Encounter  Procedures   Spirometry with graph    Standing Status:   Future    Standing Expiration Date:   10/06/2022    Order Specific Question:   Where should this test be  performed?    Answer:   [facility]   Spirometry with Graph    Order Specific Question:   Where should this test be performed?    Answer:   [facility]   Pulmonary Function Test    Standing Status:   Future    Standing Expiration Date:  10/06/2022    Order Specific Question:   Where should this test be performed?    Answer:   [facility]    Order Specific Question:   Full PFT: includes the following: basic spirometry, spirometry pre & post bronchodilator, diffusion capacity (DLCO), lung volumes    Answer:   Full PFT     Time spent: 30  I have personally obtained a history, examined the patient, evaluated laboratory and imaging results, formulated the assessment and plan and placed orders. This patient was seen by Drema Dallas, PA-C in collaboration with Dr. Devona Konig as a part of collaborative care agreement.     Allyne Gee, MD [facility] Pulmonary and Critical Care Sleep medicine

## 2021-10-11 NOTE — Patient Instructions (Signed)
Living With Sleep Apnea Sleep apnea is a condition in which breathing pauses or becomes shallow during sleep. Sleep apnea is most commonly caused by a collapsed or blocked airway. People with sleep apnea usually snore loudly. They may have times when they gasp and stop breathing for 10 seconds or more during sleep. This may happen many times during the night. The breaks in breathing also interrupt the deep sleep that you need to feel rested. Even if you do not completely wake up from the gaps in breathing, your sleep may not be restful and you feel tired during the day. You may also have a headache in the morning and low energy during the day, and you may feel anxious or depressed. How can sleep apnea affect me? Sleep apnea increases your chances of extreme tiredness during the day (daytime fatigue). It can also increase your risk for health conditions, such as: Heart attack. Stroke. Obesity. Type 2 diabetes. Heart failure. Irregular heartbeat. High blood pressure. If you have daytime fatigue as a result of sleep apnea, you may be more likely to: Perform poorly at school or work. Fall asleep while driving. Have difficulty with attention. Develop depression or anxiety. Have sexual dysfunction. What actions can I take to manage sleep apnea? Sleep apnea treatment  If you were given a device to open your airway while you sleep, use it only as told by your health care provider. You may be given: An oral appliance. This is a custom-made mouthpiece that shifts your lower jaw forward. A continuous positive airway pressure (CPAP) device. This device blows air through a mask when you breathe out (exhale). A nasal expiratory positive airway pressure (EPAP) device. This device has valves that you put into each nostril. A bi-level positive airway pressure (BIPAP) device. This device blows air through a mask when you breathe in (inhale) and breathe out (exhale). You may need surgery if other treatments  do not work for you. Sleep habits Go to sleep and wake up at the same time every day. This helps set your internal clock (circadian rhythm) for sleeping. If you stay up later than usual, such as on weekends, try to get up in the morning within 2 hours of your normal wake time. Try to get at least 7-9 hours of sleep each night. Stop using a computer, tablet, and mobile phone a few hours before bedtime. Do not take long naps during the day. If you nap, limit it to 30 minutes. Have a relaxing bedtime routine. Reading or listening to music may relax you and help you sleep. Use your bedroom only for sleep. Keep your television and computer out of your bedroom. Keep your bedroom cool, dark, and quiet. Use a supportive mattress and pillows. Follow your health care provider's instructions for other changes to sleep habits. Nutrition Do not eat heavy meals in the evening. Do not have caffeine in the later part of the day. The effects of caffeine can last for more than 5 hours. Follow your health care provider's or dietitian's instructions for any diet changes. Lifestyle     Do not drink alcohol before bedtime. Alcohol can cause you to fall asleep at first, but then it can cause you to wake up in the middle of the night and have trouble getting back to sleep. Do not use any products that contain nicotine or tobacco. These products include cigarettes, chewing tobacco, and vaping devices, such as e-cigarettes. If you need help quitting, ask your health care provider. Medicines Take   over-the-counter and prescription medicines only as told by your health care provider. Do not use over-the-counter sleep medicine. You can become dependent on this medicine, and it can make sleep apnea worse. Do not use medicines, such as sedatives and narcotics, unless told by your health care provider. Activity Exercise on most days, but avoid exercising in the evening. Exercising near bedtime can interfere with  sleeping. If possible, spend time outside every day. Natural light helps regulate your circadian rhythm. General information Lose weight if you need to, and maintain a healthy weight. Keep all follow-up visits. This is important. If you are having surgery, make sure to tell your health care provider that you have sleep apnea. You may need to bring your device with you. Where to find more information Learn more about sleep apnea and daytime fatigue from: American Sleep Association: sleepassociation.[place]: sleepfoundation.org National Heart, Lung, and Blood Institute: https://www.[place]man-hill.biz/ Summary Sleep apnea is a condition in which breathing pauses or becomes shallow during sleep. Sleep apnea can cause daytime fatigue and other serious health conditions. You may need to wear a device while sleeping to help keep your airway open. If you are having surgery, make sure to tell your health care provider that you have sleep apnea. You may need to bring your device with you. Making changes to sleep habits, diet, lifestyle, and activity can help you manage sleep apnea. This information is not intended to replace advice given to you by your health care provider. Make sure you discuss any questions you have with your health care provider. Document Revised: 08/31/2020 Document Reviewed: 01/01/2020 Elsevier Patient Education  [place]. Chronic Obstructive Pulmonary Disease  Chronic obstructive pulmonary disease (COPD) is a long-term (chronic) lung problem. When you have COPD, it is hard for air to get in and out of your lungs. Usually the condition gets worse over time, and your lungs will never return to normal. There are things you can do to keep yourself as healthy as possible. What are the causes? Smoking. This is the most common cause. Certain genes passed from parent to child (inherited). What increases the risk? Being exposed to secondhand smoke from cigarettes, pipes,  or cigars. Being exposed to chemicals and other irritants, such as fumes and dust in the work environment. Having chronic lung conditions or infections. What are the signs or symptoms? Shortness of breath, especially during physical activity. A long-term cough with a large amount of thick mucus. Sometimes, the cough may not have any mucus (dry cough). Wheezing. Breathing quickly. Skin that looks gray or blue, especially in the fingers, toes, or lips. Feeling tired (fatigue). Weight loss. Chest tightness. Having infections often. Episodes when breathing symptoms become much worse (exacerbations). At the later stages of this disease, you may have swelling in the ankles, feet, or legs. How is this treated? Taking medicines. Quitting smoking, if you smoke. Rehabilitation. This includes steps to make your body work better. It may involve a team of specialists. Doing exercises. Making changes to your diet. Using oxygen. Lung surgery. Lung transplant. Comfort measures (palliative care). Follow these instructions at home: Medicines Take over-the-counter and prescription medicines only as told by your doctor. Talk to your doctor before taking any cough or allergy medicines. You may need to avoid medicines that cause your lungs to be dry. Lifestyle If you smoke, stop smoking. Smoking makes the problem worse. Do not smoke or use any products that contain nicotine or tobacco. If you need help quitting, ask your doctor.  Avoid being around things that make your breathing worse. This may include smoke, chemicals, and fumes. Stay active, but remember to rest as well. Learn and use tips on how to manage stress and control your breathing. Make sure you get enough sleep. Most adults need at least 7 hours of sleep every night. Eat healthy foods. Eat smaller meals more often. Rest before meals. Controlled breathing Learn and use tips on how to control your breathing as told by your doctor.  Try: Breathing in (inhaling) through your nose for 1 second. Then, pucker your lips and breath out (exhale) through your lips for 2 seconds. Putting one hand on your belly (abdomen). Breathe in slowly through your nose for 1 second. Your hand on your belly should move out. Pucker your lips and breathe out slowly through your lips. Your hand on your belly should move in as you breathe out.  Controlled coughing Learn and use controlled coughing to clear mucus from your lungs. Follow these steps: Lean your head a little forward. Breathe in deeply. Try to hold your breath for 3 seconds. Keep your mouth slightly open while coughing 2 times. Spit any mucus out into a tissue. Rest and do the steps again 1 or 2 times as needed. General instructions Make sure you get all the shots (vaccines) that your doctor recommends. Ask your doctor about a flu shot and a pneumonia shot. Use oxygen therapy and pulmonary rehabilitation if told by your doctor. If you need home oxygen therapy, ask your doctor if you should buy a tool to measure your oxygen level (oximeter). Make a COPD action plan with your doctor. This helps you to know what to do if you feel worse than usual. Manage any other conditions you have as told by your doctor. Avoid going outside when it is very hot, cold, or humid. Avoid people who have a sickness you can catch (contagious). Keep all follow-up visits. Contact a doctor if: You cough up more mucus than usual. There is a change in the color or thickness of the mucus. It is harder to breathe than usual. Your breathing is faster than usual. You have trouble sleeping. You need to use your medicines more often than usual. You have trouble doing your normal activities such as getting dressed or walking around the house. Get help right away if: You have shortness of breath while resting. You have shortness of breath that stops you from: Being able to talk. Doing normal activities. Your  chest hurts for longer than 5 minutes. Your skin color is more blue than usual. Your pulse oximeter shows that you have low oxygen for longer than 5 minutes. You have a fever. You feel too tired to breathe normally. These symptoms may represent a serious problem that is an emergency. Do not wait to see if the symptoms will go away. Get medical help right away. Call your local emergency services (911 in the [place] Do not drive yourself to the hospital. Summary Chronic obstructive pulmonary disease (COPD) is a long-term lung problem. The way your lungs work will never return to normal. Usually the condition gets worse over time. There are things you can do to keep yourself as healthy as possible. Take over-the-counter and prescription medicines only as told by your doctor. If you smoke, stop. Smoking makes the problem worse. This information is not intended to replace advice given to you by your health care provider. Make sure you discuss any questions you have with your health care provider. Document  Revised: 12/01/2019 Document Reviewed: 12/01/2019 Elsevier Patient Education  [place].

## 2021-10-16 ENCOUNTER — Encounter: Payer: Self-pay | Admitting: Nurse Practitioner

## 2021-10-16 ENCOUNTER — Ambulatory Visit: Payer: Medicaid Other | Admitting: Nurse Practitioner

## 2021-10-16 VITALS — BP 121/83 | HR 110 | Temp 98.4°F | Resp 16 | Ht 62.0 in | Wt 198.0 lb

## 2021-10-16 DIAGNOSIS — R000 Tachycardia, unspecified: Secondary | ICD-10-CM | POA: Diagnosis not present

## 2021-10-16 DIAGNOSIS — R002 Palpitations: Secondary | ICD-10-CM | POA: Diagnosis not present

## 2021-10-16 DIAGNOSIS — M5416 Radiculopathy, lumbar region: Secondary | ICD-10-CM

## 2021-10-16 DIAGNOSIS — F321 Major depressive disorder, single episode, moderate: Secondary | ICD-10-CM

## 2021-10-16 DIAGNOSIS — R9431 Abnormal electrocardiogram [ECG] [EKG]: Secondary | ICD-10-CM | POA: Diagnosis not present

## 2021-10-16 DIAGNOSIS — M47816 Spondylosis without myelopathy or radiculopathy, lumbar region: Secondary | ICD-10-CM

## 2021-10-16 DIAGNOSIS — I351 Nonrheumatic aortic (valve) insufficiency: Secondary | ICD-10-CM | POA: Diagnosis not present

## 2021-10-16 DIAGNOSIS — Z9109 Other allergy status, other than to drugs and biological substances: Secondary | ICD-10-CM

## 2021-10-16 DIAGNOSIS — F411 Generalized anxiety disorder: Secondary | ICD-10-CM

## 2021-10-16 DIAGNOSIS — R0781 Pleurodynia: Secondary | ICD-10-CM

## 2021-10-16 DIAGNOSIS — M1990 Unspecified osteoarthritis, unspecified site: Secondary | ICD-10-CM

## 2021-10-16 DIAGNOSIS — J449 Chronic obstructive pulmonary disease, unspecified: Secondary | ICD-10-CM

## 2021-10-16 DIAGNOSIS — Z760 Encounter for issue of repeat prescription: Secondary | ICD-10-CM

## 2021-10-16 DIAGNOSIS — G4709 Other insomnia: Secondary | ICD-10-CM

## 2021-10-16 MED ORDER — DICLOFENAC SODIUM 75 MG PO TBEC: 75.0000 mg | DELAYED_RELEASE_TABLET | Freq: Two times a day (BID) | ORAL | 2 refills | Status: DC

## 2021-10-16 MED ORDER — ALPRAZOLAM 0.5 MG PO TABS: 0.5000 mg | ORAL_TABLET | Freq: Three times a day (TID) | ORAL | 2 refills | Status: DC | PRN

## 2021-10-16 MED ORDER — BUSPIRONE HCL 30 MG PO TABS: 30.0000 mg | ORAL_TABLET | Freq: Two times a day (BID) | ORAL | 1 refills | Status: DC

## 2021-10-16 MED ORDER — IPRATROPIUM-ALBUTEROL 0.5-2.5 (3) MG/3ML IN SOLN: 3.0000 mL | Freq: Four times a day (QID) | RESPIRATORY_TRACT | 3 refills | Status: DC | PRN

## 2021-10-16 MED ORDER — LEVOCETIRIZINE DIHYDROCHLORIDE 5 MG PO TABS: 5.0000 mg | ORAL_TABLET | Freq: Every evening | ORAL | 1 refills | Status: DC

## 2021-10-16 MED ORDER — TRAZODONE HCL 50 MG PO TABS: 50.0000 mg | ORAL_TABLET | Freq: Every evening | ORAL | 3 refills | Status: DC | PRN

## 2021-10-16 MED ORDER — HYDROCODONE-ACETAMINOPHEN 5-325 MG PO TABS: 1.0000 | ORAL_TABLET | Freq: Two times a day (BID) | ORAL | 0 refills | Status: DC | PRN

## 2021-10-16 MED ORDER — MONTELUKAST SODIUM 10 MG PO TABS: 10.0000 mg | ORAL_TABLET | Freq: Every day | ORAL | 3 refills | Status: DC

## 2021-10-16 MED ORDER — SERTRALINE HCL 100 MG PO TABS: 150.0000 mg | ORAL_TABLET | Freq: Every day | ORAL | 1 refills | Status: DC

## 2021-10-16 MED ORDER — CYCLOBENZAPRINE HCL 10 MG PO TABS: 10.0000 mg | ORAL_TABLET | Freq: Every day | ORAL | 2 refills | Status: DC

## 2021-10-16 MED ORDER — PREGABALIN 150 MG PO CAPS: 150.0000 mg | ORAL_CAPSULE | Freq: Two times a day (BID) | ORAL | 2 refills | Status: DC

## 2021-10-16 NOTE — Progress Notes (Signed)
[facility] [place], [place] [address]  Internal MEDICINE  Office Visit Note  Patient Name: Margaret Blackburn  [id number]  [id number]  Date of Service: 10/16/2021  Chief Complaint  Patient presents with   Follow-up    Follow up meds refills   Depression    HPI Bonna presents for a follow up visit for Arthritis joint pains, anxiety and depression --Stopped celebrex and wellbutrin earlier this year --Wants to try something for inflammation again due to joint pains  --Has not seen dr. Janese Banks since July last year and is not taking anastrozole right now, needs to call their opffice to schedule appt with dr. Janese Banks.  Heart rate is Tachy and having palpitations Prior echo from 2021 EKG done in office --need med refills as well.   Current Medication: Outpatient Encounter Medications as of 10/16/2021  Medication Sig Note   albuterol (VENTOLIN HFA) 108 (90 Base) MCG/ACT inhaler Inhale 2 puffs into the lungs every 6 (six) hours as needed for wheezing or shortness of breath.    anastrozole (ARIMIDEX) 1 MG tablet TAKE 1 TABLET BY MOUTH ONCE A DAY    aspirin EC 81 MG tablet Take 81 mg by mouth daily. Swallow whole.    azelastine (ASTELIN) 0.1 % nasal spray Place 1 spray into both nostrils 2 (two) times daily. Use in each nostril as directed    Calcium Carb-Cholecalciferol (CALCIUM 600+D3 PO) Take 1 tablet by mouth in the morning and at bedtime. 08/25/2019: Pt does not know dose of vit d 3   diclofenac (VOLTAREN) 75 MG EC tablet Take 1 tablet (75 mg total) by mouth 2 (two) times daily.    Fluticasone-Umeclidin-Vilant (TRELEGY ELLIPTA) 100-62.5-25 MCG/ACT AEPB Inhale 1 puff into the lungs daily.    metFORMIN (GLUCOPHAGE) 500 MG tablet Take 1 tablet (500 mg total) by mouth 2 (two) times daily with a meal.    Multiple Vitamins-Minerals (ALIVE WOMENS ENERGY PO) Take 1 Dose by mouth daily.    predniSONE (STERAPRED UNI-PAK 21 TAB) 10 MG (21) TBPK tablet Use as directed for 6 days     rosuvastatin (CRESTOR) 5 MG tablet Take 1 tablet (5 mg total) by mouth daily.    [DISCONTINUED] ALPRAZolam (XANAX) 0.5 MG tablet Take 1 tablet (0.5 mg total) by mouth 3 (three) times daily as needed for anxiety or sleep.    [DISCONTINUED] busPIRone (BUSPAR) 30 MG tablet Take 1 tablet (30 mg total) by mouth 2 (two) times daily.    [DISCONTINUED] cyclobenzaprine (FLEXERIL) 10 MG tablet Take 1 tablet (10 mg total) by mouth at bedtime. Take one tab po qhs for back spasm prn only    [DISCONTINUED] HYDROcodone-acetaminophen (NORCO) 5-325 MG tablet Take 1 tablet by mouth 2 (two) times daily as needed for moderate pain or severe pain.    [DISCONTINUED] HYDROcodone-acetaminophen (NORCO) 5-325 MG tablet Take 1 tablet by mouth 2 (two) times daily as needed for moderate pain or severe pain.    [DISCONTINUED] ipratropium-albuterol (DUONEB) 0.5-2.5 (3) MG/3ML SOLN Take 3 mLs by nebulization every 6 (six) hours as needed.    [DISCONTINUED] levocetirizine (XYZAL) 5 MG tablet Take 1 tablet (5 mg total) by mouth every evening.    [DISCONTINUED] montelukast (SINGULAIR) 10 MG tablet TAKE 1 TABLET BY MOUTH EVERY NIGHT AT BEDTIME    [DISCONTINUED] pregabalin (LYRICA) 150 MG capsule Take 1 capsule (150 mg total) by mouth 2 (two) times daily.    [DISCONTINUED] sertraline (ZOLOFT) 100 MG tablet Take 1.5 tablets (150 mg total) by  mouth daily.    [DISCONTINUED] traZODone (DESYREL) 50 MG tablet Take 1-2 tablets (50-100 mg total) by mouth at bedtime as needed for sleep.    ALPRAZolam (XANAX) 0.5 MG tablet Take 1 tablet (0.5 mg total) by mouth 3 (three) times daily as needed for anxiety or sleep.    busPIRone (BUSPAR) 30 MG tablet Take 1 tablet (30 mg total) by mouth 2 (two) times daily.    cyclobenzaprine (FLEXERIL) 10 MG tablet Take 1 tablet (10 mg total) by mouth at bedtime. Take one tab po qhs for back spasm prn only    HYDROcodone-acetaminophen (NORCO) 5-325 MG tablet Take 1 tablet by mouth 2 (two) times daily as needed  for moderate pain or severe pain.    HYDROcodone-acetaminophen (NORCO) 5-325 MG tablet Take 1 tablet by mouth 2 (two) times daily as needed for moderate pain or severe pain.    ipratropium-albuterol (DUONEB) 0.5-2.5 (3) MG/3ML SOLN Take 3 mLs by nebulization every 6 (six) hours as needed.    levocetirizine (XYZAL) 5 MG tablet Take 1 tablet (5 mg total) by mouth every evening.    montelukast (SINGULAIR) 10 MG tablet Take 1 tablet (10 mg total) by mouth at bedtime.    pregabalin (LYRICA) 150 MG capsule Take 1 capsule (150 mg total) by mouth 2 (two) times daily.    sertraline (ZOLOFT) 100 MG tablet Take 1.5 tablets (150 mg total) by mouth daily.    traZODone (DESYREL) 50 MG tablet Take 1-2 tablets (50-100 mg total) by mouth at bedtime as needed for sleep.    No facility-administered encounter medications on file as of 10/16/2021.    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   BREAST LUMPECTOMY WITH NEEDLE LOCALIZATION Right 03/03/2018   Procedure: BREAST LUMPECTOMY WITH NEEDLE LOCALIZATION;  Surgeon: Vickie Epley, MD;  Location: [facility] ORS;  Service: General;  Laterality: Right;   COLONOSCOPY WITH PROPOFOL N/A 08/31/2019   Procedure: COLONOSCOPY WITH PROPOFOL;  Surgeon: Jonathon Bellows, MD;  Location: [facility];  Service: Gastroenterology;  Laterality: N/A;   SENTINEL NODE BIOPSY Right 03/21/2018   Procedure: RIGHT SENTINEL LYMPH  NODE BIOPSY;  Surgeon: Vickie Epley, MD;  Location: [facility] ORS;  Service: General;  Laterality: Right;    Medical History: Past Medical History:  Diagnosis Date   Allergy    Arthritis    Breast cancer ([place]) 12/2017   right breast   COPD (chronic obstructive pulmonary disease) ([place])    Depression    Personal history of radiation therapy    Prediabetes     Sleep apnea    supposed to get CPAP 03/25/19    Family History: Family History  Problem Relation Age of Onset   Breast cancer Mother 35   Lung cancer Mother    Breast cancer Maternal Grandmother    Thyroid cancer Father     Social History   Socioeconomic History   Marital status: Single    Spouse name: Not on file   Number of children: 2   Years of education: Not on file   Highest education level: Not on file  Occupational History   Not on file  Tobacco Use   Smoking status: Every Day    Packs/day: 2.00    Years: 39.00    Total pack  years: 78.00    Types: Cigarettes, E-cigarettes   Smokeless tobacco: Never   Tobacco comments:    Smokes a pack and a half daily  Vaping Use   Vaping Use: Former   Devices: used  2 weeks per pt  Substance and Sexual Activity   Alcohol use: No   Drug use: Yes    Types: Marijuana    Comment: occ   Sexual activity: Yes  Other Topics Concern   Not on file  Social History Narrative   Not on file   Social Determinants of Health   Financial Resource Strain: Not on file  Food Insecurity: Not on file  Transportation Needs: Not on file  Physical Activity: Not on file  Stress: Not on file  Social Connections: Not on file  Intimate Partner Violence: Not on file      Review of Systems  Constitutional:  Positive for fatigue. Negative for chills and unexpected weight change.  HENT:  Negative for congestion, rhinorrhea, sneezing and sore throat.   Respiratory: Negative.  Negative for cough, chest tightness, shortness of breath and wheezing.   Cardiovascular:  Positive for palpitations. Negative for chest pain.  Gastrointestinal:  Negative for abdominal pain, constipation, diarrhea, nausea and vomiting.  Musculoskeletal:  Positive for arthralgias, back pain and myalgias. Negative for joint swelling and neck pain.  Skin:  Negative for rash.  Neurological: Negative.   Psychiatric/Behavioral:  Positive for behavioral problems (Depression)  and sleep disturbance. Negative for self-injury and suicidal ideas. The patient is nervous/anxious.     Vital Signs: BP 121/83   Pulse (!) 110   Temp 98.4 F (36.9 C)   Resp 16   Ht '5\' 2"'$  (1.575 m)   Wt 198 lb (89.8 kg)   SpO2 98%   BMI 36.21 kg/m    Physical Exam Constitutional:      General: She is not in acute distress.    Appearance: Normal appearance. She is obese. She is not ill-appearing.  HENT:     Head: Normocephalic and atraumatic.  Eyes:     Pupils: Pupils are equal, round, and reactive to light.  Cardiovascular:     Rate and Rhythm: Regular rhythm. Tachycardia present.     Heart sounds: Normal heart sounds. No murmur heard. Pulmonary:     Effort: Pulmonary effort is normal. No respiratory distress.     Breath sounds: Normal breath sounds. No wheezing or rales.  Skin:    Capillary Refill: Capillary refill takes less than 2 seconds.  Neurological:     Mental Status: She is alert and oriented to person, place, and time.        Assessment/Plan: 1. Palpitations EKG done in office, results were abnormal, echo ordered. - EKG 12-Lead - ECHOCARDIOGRAM COMPLETE; Future  2. Symptomatic tachycardia Sinus tachycardia with left atrial enlargement and borderline peaked T waves on EKG - EKG 12-Lead  3. Abnormal EKG EKG was abnormal, echo ordered - ECHOCARDIOGRAM COMPLETE; Future  4. Nonrheumatic aortic valve insufficiency Diagnosed on prior echocardiogram, need repeat echo  - ECHOCARDIOGRAM COMPLETE; Future  5. Inflammatory arthropathy Try oral diclofenac, evaluate effectiveness at next visit.   6. Medication refill - ALPRAZolam (XANAX) 0.5 MG tablet; Take 1 tablet (0.5 mg total) by mouth 3 (three) times daily as needed for anxiety or sleep.  Dispense: 90 tablet; Refill: 2 - busPIRone (BUSPAR) 30 MG tablet; Take 1 tablet (30 mg total) by mouth 2 (two) times daily.  Dispense: 180 tablet; Refill: 1 - ipratropium-albuterol (DUONEB) 0.5-2.5 (3)  MG/3ML SOLN;  Take 3 mLs by nebulization every 6 (six) hours as needed.  Dispense: 360 mL; Refill: 3 - montelukast (SINGULAIR) 10 MG tablet; Take 1 tablet (10 mg total) by mouth at bedtime.  Dispense: 90 tablet; Refill: 3 - pregabalin (LYRICA) 150 MG capsule; Take 1 capsule (150 mg total) by mouth 2 (two) times daily.  Dispense: 60 capsule; Refill: 2 - sertraline (ZOLOFT) 100 MG tablet; Take 1.5 tablets (150 mg total) by mouth daily.  Dispense: 135 tablet; Refill: 1 - traZODone (DESYREL) 50 MG tablet; Take 1-2 tablets (50-100 mg total) by mouth at bedtime as needed for sleep.  Dispense: 180 tablet; Refill: 3   General Counseling: Tiarah verbalizes understanding of the findings of todays visit and agrees with plan of treatment. I have discussed any further diagnostic evaluation that may be needed or ordered today. We also reviewed her medications today. she has been encouraged to call the office with any questions or concerns that should arise related to todays visit.    Orders Placed This Encounter  Procedures   EKG 12-Lead   ECHOCARDIOGRAM COMPLETE    Meds ordered this encounter  Medications   diclofenac (VOLTAREN) 75 MG EC tablet    Sig: Take 1 tablet (75 mg total) by mouth 2 (two) times daily.    Dispense:  60 tablet    Refill:  2   ALPRAZolam (XANAX) 0.5 MG tablet    Sig: Take 1 tablet (0.5 mg total) by mouth 3 (three) times daily as needed for anxiety or sleep.    Dispense:  90 tablet    Refill:  2    Refills for July and august   busPIRone (BUSPAR) 30 MG tablet    Sig: Take 1 tablet (30 mg total) by mouth 2 (two) times daily.    Dispense:  180 tablet    Refill:  1    For future refills   cyclobenzaprine (FLEXERIL) 10 MG tablet    Sig: Take 1 tablet (10 mg total) by mouth at bedtime. Take one tab po qhs for back spasm prn only    Dispense:  30 tablet    Refill:  2   HYDROcodone-acetaminophen (NORCO) 5-325 MG tablet    Sig: Take 1 tablet by mouth 2 (two) times daily as needed for  moderate pain or severe pain.    Dispense:  60 tablet    Refill:  0    Reorder recent initial prescription for twice daily prn. Please fill today thanks.   HYDROcodone-acetaminophen (NORCO) 5-325 MG tablet    Sig: Take 1 tablet by mouth 2 (two) times daily as needed for moderate pain or severe pain.    Dispense:  60 tablet    Refill:  0    Fill for July if needed.   ipratropium-albuterol (DUONEB) 0.5-2.5 (3) MG/3ML SOLN    Sig: Take 3 mLs by nebulization every 6 (six) hours as needed.    Dispense:  360 mL    Refill:  3   levocetirizine (XYZAL) 5 MG tablet    Sig: Take 1 tablet (5 mg total) by mouth every evening.    Dispense:  90 tablet    Refill:  1   montelukast (SINGULAIR) 10 MG tablet    Sig: Take 1 tablet (10 mg total) by mouth at bedtime.    Dispense:  90 tablet    Refill:  3   pregabalin (LYRICA) 150 MG capsule    Sig: Take 1 capsule (150 mg total) by mouth  2 (two) times daily.    Dispense:  60 capsule    Refill:  2   sertraline (ZOLOFT) 100 MG tablet    Sig: Take 1.5 tablets (150 mg total) by mouth daily.    Dispense:  135 tablet    Refill:  1   traZODone (DESYREL) 50 MG tablet    Sig: Take 1-2 tablets (50-100 mg total) by mouth at bedtime as needed for sleep.    Dispense:  180 tablet    Refill:  3    Return in about 1 month (around 11/15/2021) for F/U, eval new med, Deondrea Aguado PCP and need echo at [place].   Total time spent:30 Minutes Time spent includes review of chart, medications, test results, and follow up plan with the patient.   [place] Controlled Substance Database was reviewed by me.  This patient was seen by Jonetta Osgood, FNP-C in collaboration with Dr. Clayborn Bigness as a part of collaborative care agreement.   Anagabriela Jokerst R. Valetta Fuller, MSN, FNP-C Internal medicine

## 2021-10-22 ENCOUNTER — Telehealth: Payer: Self-pay

## 2021-10-22 DIAGNOSIS — M549 Dorsalgia, unspecified: Secondary | ICD-10-CM | POA: Insufficient documentation

## 2021-10-22 DIAGNOSIS — M1990 Unspecified osteoarthritis, unspecified site: Secondary | ICD-10-CM | POA: Insufficient documentation

## 2021-10-22 MED ORDER — DICLOFENAC SODIUM 75 MG PO TBEC: 75.0000 mg | DELAYED_RELEASE_TABLET | Freq: Two times a day (BID) | ORAL | 2 refills | Status: DC

## 2021-10-22 NOTE — Telephone Encounter (Signed)
PA for DICLOFENAC (VOLTAREN) 75 mg tab sent 10/22/21 @ 710 pm  PA came back APPROVED and valid from 10/22/21 to 10/21/21.  Sent new rx to pharmacy with approval info

## 2021-11-07 ENCOUNTER — Telehealth: Payer: Self-pay | Admitting: Nurse Practitioner

## 2021-11-07 NOTE — Telephone Encounter (Signed)
Lvm and sent mychart msg letting patient know her 11/13/21 u/s scheduled here @ office has been moved to [facility] 11/20/21 @ 9:00 due to insurance-Toni

## 2021-11-13 ENCOUNTER — Other Ambulatory Visit: Payer: Medicaid Other

## 2021-11-14 ENCOUNTER — Telehealth: Payer: Self-pay

## 2021-11-14 NOTE — Telephone Encounter (Signed)
Lmom to call us back that we are unable to do PA for hydrocodone

## 2021-11-15 ENCOUNTER — Telehealth: Payer: Self-pay

## 2021-11-15 NOTE — Telephone Encounter (Signed)
Pt advised that hydrocodone denied asper alyssa advised her that we can refer her pain clinic asper pt he don't want no narcotic and like to hold referral for pain management  to take no more pain med and also called phar and canceled all hydrocodone pres

## 2021-11-20 ENCOUNTER — Ambulatory Visit: Payer: Medicaid Other

## 2021-11-23 ENCOUNTER — Ambulatory Visit: Payer: Medicaid Other | Admitting: Nurse Practitioner

## 2021-11-24 ENCOUNTER — Encounter: Payer: Medicaid Other | Admitting: Nurse Practitioner

## 2021-11-29 ENCOUNTER — Ambulatory Visit
Admission: RE | Admit: 2021-11-29 | Discharge: 2021-11-29 | Disposition: A | Discharge status: Home / Self Care | Payer: Medicaid Other | Source: Ambulatory Visit | Attending: Nurse Practitioner | Admitting: Nurse Practitioner

## 2021-11-29 DIAGNOSIS — G4730 Sleep apnea, unspecified: Secondary | ICD-10-CM | POA: Insufficient documentation

## 2021-11-29 DIAGNOSIS — J449 Chronic obstructive pulmonary disease, unspecified: Secondary | ICD-10-CM | POA: Diagnosis not present

## 2021-11-29 DIAGNOSIS — R002 Palpitations: Secondary | ICD-10-CM | POA: Diagnosis not present

## 2021-11-29 DIAGNOSIS — R9431 Abnormal electrocardiogram [ECG] [EKG]: Secondary | ICD-10-CM | POA: Insufficient documentation

## 2021-11-29 DIAGNOSIS — Z853 Personal history of malignant neoplasm of breast: Secondary | ICD-10-CM | POA: Insufficient documentation

## 2021-11-29 DIAGNOSIS — I351 Nonrheumatic aortic (valve) insufficiency: Secondary | ICD-10-CM | POA: Diagnosis not present

## 2021-11-29 LAB — ECHOCARDIOGRAM COMPLETE
AR max vel: 1.51 cm2
AV Area VTI: 2.37 cm2
AV Area mean vel: 1.37 cm2
AV Mean grad: 8.5 mmHg
AV Peak grad: 15.8 mmHg
Ao pk vel: 1.99 m/s
Area-P 1/2: 5.54 cm2
S' Lateral: 3 cm

## 2021-11-29 NOTE — Progress Notes (Signed)
*  PRELIMINARY RESULTS* Echocardiogram 2D Echocardiogram has been performed.  Margaret Blackburn 11/29/2021, 10:31 AM

## 2021-12-04 ENCOUNTER — Ambulatory Visit: Payer: Medicaid Other | Admitting: Nurse Practitioner

## 2021-12-04 ENCOUNTER — Encounter: Payer: Self-pay | Admitting: Nurse Practitioner

## 2021-12-04 VITALS — BP 118/77 | HR 111 | Temp 97.2°F | Resp 16 | Ht 62.0 in | Wt 200.0 lb

## 2021-12-04 DIAGNOSIS — E559 Vitamin D deficiency, unspecified: Secondary | ICD-10-CM

## 2021-12-04 DIAGNOSIS — M5416 Radiculopathy, lumbar region: Secondary | ICD-10-CM

## 2021-12-04 DIAGNOSIS — J449 Chronic obstructive pulmonary disease, unspecified: Secondary | ICD-10-CM

## 2021-12-04 DIAGNOSIS — E782 Mixed hyperlipidemia: Secondary | ICD-10-CM | POA: Diagnosis not present

## 2021-12-04 DIAGNOSIS — Z9109 Other allergy status, other than to drugs and biological substances: Secondary | ICD-10-CM

## 2021-12-04 DIAGNOSIS — M1990 Unspecified osteoarthritis, unspecified site: Secondary | ICD-10-CM

## 2021-12-04 DIAGNOSIS — G4709 Other insomnia: Secondary | ICD-10-CM

## 2021-12-04 DIAGNOSIS — R0781 Pleurodynia: Secondary | ICD-10-CM

## 2021-12-04 DIAGNOSIS — R5383 Other fatigue: Secondary | ICD-10-CM | POA: Diagnosis not present

## 2021-12-04 DIAGNOSIS — E538 Deficiency of other specified B group vitamins: Secondary | ICD-10-CM

## 2021-12-04 DIAGNOSIS — M47816 Spondylosis without myelopathy or radiculopathy, lumbar region: Secondary | ICD-10-CM

## 2021-12-04 DIAGNOSIS — Z760 Encounter for issue of repeat prescription: Secondary | ICD-10-CM

## 2021-12-04 MED ORDER — ALBUTEROL SULFATE HFA 108 (90 BASE) MCG/ACT IN AERS: 2.0000 | INHALATION_SPRAY | Freq: Four times a day (QID) | RESPIRATORY_TRACT | 5 refills | Status: DC | PRN

## 2021-12-04 MED ORDER — LEVOCETIRIZINE DIHYDROCHLORIDE 5 MG PO TABS: 5.0000 mg | ORAL_TABLET | Freq: Every evening | ORAL | 1 refills | Status: DC

## 2021-12-04 MED ORDER — CYCLOBENZAPRINE HCL 10 MG PO TABS: 10.0000 mg | ORAL_TABLET | Freq: Every day | ORAL | 2 refills | Status: DC

## 2021-12-04 NOTE — Progress Notes (Signed)
[facility] [place], [place] [address]  Internal MEDICINE  Office Visit Note  Patient Name: Margaret Blackburn  [id number]  [id number]  Date of Service: 12/04/2021  Chief Complaint  Patient presents with   Follow-up    Follow up review ultrasound    Depression    HPI Margaret Blackburn presents for a follow up visit to discuss her echocardiogram results.  Normal echo with some improvement compared to prior, no valve regurgitation seen on this echo.  Upcoming annual physical exam, due for routine labs, wants to have them done prior to her visit  Medication refills due   Current Medication: Outpatient Encounter Medications as of 12/04/2021  Medication Sig Note   anastrozole (ARIMIDEX) 1 MG tablet TAKE 1 TABLET BY MOUTH ONCE A DAY    azelastine (ASTELIN) 0.1 % nasal spray Place 1 spray into both nostrils 2 (two) times daily. Use in each nostril as directed    busPIRone (BUSPAR) 30 MG tablet Take 1 tablet (30 mg total) by mouth 2 (two) times daily.    Calcium Carb-Cholecalciferol (CALCIUM 600+D3 PO) Take 1 tablet by mouth in the morning and at bedtime. 08/25/2019: Pt does not know dose of vit d 3   Fluticasone-Umeclidin-Vilant (TRELEGY ELLIPTA) 100-62.5-25 MCG/ACT AEPB Inhale 1 puff into the lungs daily.    ipratropium-albuterol (DUONEB) 0.5-2.5 (3) MG/3ML SOLN Take 3 mLs by nebulization every 6 (six) hours as needed.    metFORMIN (GLUCOPHAGE) 500 MG tablet Take 1 tablet (500 mg total) by mouth 2 (two) times daily with a meal.    montelukast (SINGULAIR) 10 MG tablet Take 1 tablet (10 mg total) by mouth at bedtime.    Multiple Vitamins-Minerals (ALIVE WOMENS ENERGY PO) Take 1 Dose by mouth daily.    pregabalin (LYRICA) 150 MG capsule Take 1 capsule (150 mg total) by mouth 2 (two) times daily.    rosuvastatin (CRESTOR) 5 MG tablet Take 1 tablet (5 mg total) by mouth daily.    sertraline (ZOLOFT) 100 MG tablet Take 1.5 tablets (150 mg total) by mouth daily.    traZODone  (DESYREL) 50 MG tablet Take 1-2 tablets (50-100 mg total) by mouth at bedtime as needed for sleep.    [DISCONTINUED] albuterol (VENTOLIN HFA) 108 (90 Base) MCG/ACT inhaler Inhale 2 puffs into the lungs every 6 (six) hours as needed for wheezing or shortness of breath.    [DISCONTINUED] ALPRAZolam (XANAX) 0.5 MG tablet Take 1 tablet (0.5 mg total) by mouth 3 (three) times daily as needed for anxiety or sleep.    [DISCONTINUED] aspirin EC 81 MG tablet Take 81 mg by mouth daily. Swallow whole.    [DISCONTINUED] cyclobenzaprine (FLEXERIL) 10 MG tablet Take 1 tablet (10 mg total) by mouth at bedtime. Take one tab po qhs for back spasm prn only    [DISCONTINUED] diclofenac (VOLTAREN) 75 MG EC tablet Take 1 tablet (75 mg total) by mouth 2 (two) times daily.    [DISCONTINUED] HYDROcodone-acetaminophen (NORCO) 5-325 MG tablet Take 1 tablet by mouth 2 (two) times daily as needed for moderate pain or severe pain.    [DISCONTINUED] HYDROcodone-acetaminophen (NORCO) 5-325 MG tablet Take 1 tablet by mouth 2 (two) times daily as needed for moderate pain or severe pain.    [DISCONTINUED] levocetirizine (XYZAL) 5 MG tablet Take 1 tablet (5 mg total) by mouth every evening.    [DISCONTINUED] predniSONE (STERAPRED UNI-PAK 21 TAB) 10 MG (21) TBPK tablet Use as directed for 6 days    albuterol (VENTOLIN HFA)  108 (90 Base) MCG/ACT inhaler Inhale 2 puffs into the lungs every 6 (six) hours as needed for wheezing or shortness of breath.    levocetirizine (XYZAL) 5 MG tablet Take 1 tablet (5 mg total) by mouth every evening.    [DISCONTINUED] cyclobenzaprine (FLEXERIL) 10 MG tablet Take 1 tablet (10 mg total) by mouth at bedtime. Take one tab po qhs for back spasm prn only    No facility-administered encounter medications on file as of 12/04/2021.    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   BREAST LUMPECTOMY WITH NEEDLE LOCALIZATION Right 03/03/2018   Procedure: BREAST LUMPECTOMY WITH NEEDLE LOCALIZATION;  Surgeon: Vickie Epley, MD;  Location: [facility] ORS;  Service: General;  Laterality: Right;   COLONOSCOPY WITH PROPOFOL N/A 08/31/2019   Procedure: COLONOSCOPY WITH PROPOFOL;  Surgeon: Jonathon Bellows, MD;  Location: [facility] ENDOSCOPY;  Service: Gastroenterology;  Laterality: N/A;   SENTINEL NODE BIOPSY Right 03/21/2018   Procedure: RIGHT SENTINEL LYMPH  NODE BIOPSY;  Surgeon: Vickie Epley, MD;  Location: [facility] ORS;  Service: General;  Laterality: Right;    Medical History: Past Medical History:  Diagnosis Date   Allergy    Arthritis    Breast cancer ([place]) 12/2017   right breast   COPD (chronic obstructive pulmonary disease) ([place])    Depression    Personal history of radiation therapy    Prediabetes    Sleep apnea    supposed to get CPAP 03/25/19    Family History: Family History  Problem Relation Age of Onset   Breast cancer Mother 5   Lung cancer Mother    Breast cancer Maternal Grandmother    Thyroid cancer Father     Social History   Socioeconomic History   Marital status: Single    Spouse name: Not on file   Number of children: 2   Years of education: Not on file   Highest education level: Not on file  Occupational History   Not on file  Tobacco Use   Smoking status: Every Day    Packs/day: 2.00    Years: 39.00    Total pack years: 78.00    Types: Cigarettes, E-cigarettes   Smokeless tobacco: Never   Tobacco comments:    Smokes a pack and a half daily  Vaping Use   Vaping Use: Former   Devices: used  2 weeks per pt  Substance and Sexual Activity   Alcohol use: No   Drug use: Yes    Types: Marijuana    Comment: occ   Sexual activity: Yes  Other Topics Concern   Not on file  Social History Narrative   Not on file   Social Determinants of Health    Financial Resource Strain: Not on file  Food Insecurity: Not on file  Transportation Needs: Not on file  Physical Activity: Not on file  Stress: Not on file  Social Connections: Not on file  Intimate Partner Violence: Not on file      Review of Systems  Constitutional:  Negative for chills, fatigue and unexpected weight change.  HENT:  Negative for congestion, rhinorrhea, sneezing and sore throat.   Respiratory: Negative.  Negative for cough, chest tightness and shortness of breath.   Cardiovascular: Negative.  Negative for chest  pain and palpitations.  Gastrointestinal:  Negative for abdominal pain, constipation, diarrhea, nausea and vomiting.  Musculoskeletal:  Positive for arthralgias, back pain and myalgias. Negative for joint swelling and neck pain.  Skin:  Negative for rash.  Neurological: Negative.   Psychiatric/Behavioral:  Positive for behavioral problems (Depression) and sleep disturbance. Negative for self-injury and suicidal ideas. The patient is nervous/anxious.     Vital Signs: BP 118/77   Pulse (!) 111   Temp (!) 97.2 F (36.2 C)   Resp 16   Ht 5' 2" (1.575 m)   Wt 200 lb (90.7 kg)   SpO2 94%   BMI 36.58 kg/m    Physical Exam Vitals reviewed.  Constitutional:      General: She is not in acute distress.    Appearance: Normal appearance. She is obese. She is not ill-appearing.  HENT:     Head: Normocephalic and atraumatic.  Eyes:     Pupils: Pupils are equal, round, and reactive to light.  Cardiovascular:     Rate and Rhythm: Normal rate and regular rhythm.  Pulmonary:     Effort: Pulmonary effort is normal. No respiratory distress.  Neurological:     Mental Status: She is alert and oriented to person, place, and time.  Psychiatric:        Mood and Affect: Mood normal.        Behavior: Behavior normal.        Assessment/Plan: 1. Other fatigue Routine and additional labs ordered - CBC with Differential/Platelet - CMP14+EGFR - B12 and  Folate Panel - TSH + free T4 - Vitamin D (25 hydroxy) - Iron, TIBC and Ferritin Panel  2. Inflammatory arthropathy Routine lab ordered - TSH + free T4  3. Mixed hyperlipidemia Routine labs ordered - CMP14+EGFR - Lipid Profile - TSH + free T4  4. Vitamin D deficiency Routine lab ordered - Vitamin D (25 hydroxy)  5. B12 deficiency Routine labs ordered - CBC with Differential/Platelet - B12 and Folate Panel - Iron, TIBC and Ferritin Panel  6. Medication refill - levocetirizine (XYZAL) 5 MG tablet; Take 1 tablet (5 mg total) by mouth every evening.  Dispense: 90 tablet; Refill: 1 - albuterol (VENTOLIN HFA) 108 (90 Base) MCG/ACT inhaler; Inhale 2 puffs into the lungs every 6 (six) hours as needed for wheezing or shortness of breath.  Dispense: 18 g; Refill: 5   General Counseling: Margaret Blackburn verbalizes understanding of the findings of todays visit and agrees with plan of treatment. I have discussed any further diagnostic evaluation that may be needed or ordered today. We also reviewed her medications today. she has been encouraged to call the office with any questions or concerns that should arise related to todays visit.    Orders Placed This Encounter  Procedures   CBC with Differential/Platelet   CMP14+EGFR   Lipid Profile   B12 and Folate Panel   TSH + free T4   Vitamin D (25 hydroxy)   Iron, TIBC and Ferritin Panel    Meds ordered this encounter  Medications   levocetirizine (XYZAL) 5 MG tablet    Sig: Take 1 tablet (5 mg total) by mouth every evening.    Dispense:  90 tablet    Refill:  1   albuterol (VENTOLIN HFA) 108 (90 Base) MCG/ACT inhaler    Sig: Inhale 2 puffs into the lungs every 6 (six) hours as needed for wheezing or shortness of breath.    Dispense:  18 g    Refill:  5  DISCONTD: cyclobenzaprine (FLEXERIL) 10 MG tablet    Sig: Take 1 tablet (10 mg total) by mouth at bedtime. Take one tab po qhs for back spasm prn only    Dispense:  30 tablet     Refill:  2    Return for previously scheduled, CPE, Margaret Blackburn PCP upcoming.   Total time spent:30 Minutes Time spent includes review of chart, medications, test results, and follow up plan with the patient.   [place] Controlled Substance Database was reviewed by me.  This patient was seen by Jonetta Osgood, FNP-C in collaboration with Dr. Clayborn Bigness as a part of collaborative care agreement.   Margaret Staley R. Valetta Fuller, MSN, FNP-C Internal medicine

## 2021-12-06 LAB — CBC WITH DIFFERENTIAL/PLATELET
Basophils Absolute: 0.1 10*3/uL (ref 0.0–0.2)
Basos: 1 %
EOS (ABSOLUTE): 0.2 10*3/uL (ref 0.0–0.4)
Eos: 2 %
Hematocrit: 39.8 % (ref 34.0–46.6)
Hemoglobin: 13.6 g/dL (ref 11.1–15.9)
Immature Grans (Abs): 0 10*3/uL (ref 0.0–0.1)
Immature Granulocytes: 0 %
Lymphocytes Absolute: 2 10*3/uL (ref 0.7–3.1)
Lymphs: 20 %
MCH: 31.9 pg (ref 26.6–33.0)
MCHC: 34.2 g/dL (ref 31.5–35.7)
MCV: 93 fL (ref 79–97)
Monocytes Absolute: 0.7 10*3/uL (ref 0.1–0.9)
Monocytes: 7 %
Neutrophils Absolute: 6.9 10*3/uL (ref 1.4–7.0)
Neutrophils: 70 %
Platelets: 279 10*3/uL (ref 150–450)
RBC: 4.27 x10E6/uL (ref 3.77–5.28)
RDW: 13.3 % (ref 11.7–15.4)
WBC: 9.8 10*3/uL (ref 3.4–10.8)

## 2021-12-06 LAB — IRON,TIBC AND FERRITIN PANEL
Ferritin: 63 ng/mL (ref 15–150)
Iron Saturation: 11 % — ABNORMAL LOW (ref 15–55)
Iron: 37 ug/dL (ref 27–159)
Total Iron Binding Capacity: 331 ug/dL (ref 250–450)
UIBC: 294 ug/dL (ref 131–425)

## 2021-12-06 LAB — CMP14+EGFR
ALT: 20 IU/L (ref 0–32)
AST: 18 IU/L (ref 0–40)
Albumin/Globulin Ratio: 2 (ref 1.2–2.2)
Albumin: 4.4 g/dL (ref 3.8–4.9)
Alkaline Phosphatase: 75 IU/L (ref 44–121)
BUN/Creatinine Ratio: 14 (ref 9–23)
BUN: 9 mg/dL (ref 6–24)
Bilirubin Total: 0.2 mg/dL (ref 0.0–1.2)
CO2: 25 mmol/L (ref 20–29)
Calcium: 9.1 mg/dL (ref 8.7–10.2)
Chloride: 100 mmol/L (ref 96–106)
Creatinine, Ser: 0.64 mg/dL (ref 0.57–1.00)
Globulin, Total: 2.2 g/dL (ref 1.5–4.5)
Glucose: 115 mg/dL — ABNORMAL HIGH (ref 70–99)
Potassium: 4.1 mmol/L (ref 3.5–5.2)
Sodium: 137 mmol/L (ref 134–144)
Total Protein: 6.6 g/dL (ref 6.0–8.5)
eGFR: 104 mL/min/{1.73_m2} (ref >59)

## 2021-12-06 LAB — LIPID PANEL
Chol/HDL Ratio: 3 ratio (ref 0.0–4.4)
Cholesterol, Total: 154 mg/dL (ref 100–199)
HDL: 51 mg/dL (ref >39)
LDL Chol Calc (NIH): 86 mg/dL (ref 0–99)
Triglycerides: 94 mg/dL (ref 0–149)
VLDL Cholesterol Cal: 17 mg/dL (ref 5–40)

## 2021-12-06 LAB — B12 AND FOLATE PANEL
Folate: 15.7 ng/mL (ref >3.0)
Vitamin B-12: 567 pg/mL (ref 232–1245)

## 2021-12-06 LAB — TSH+FREE T4
Free T4: 1.15 ng/dL (ref 0.82–1.77)
TSH: 0.164 u[IU]/mL — ABNORMAL LOW (ref 0.450–4.500)

## 2021-12-06 LAB — VITAMIN D 25 HYDROXY (VIT D DEFICIENCY, FRACTURES): Vit D, 25-Hydroxy: 31.2 ng/mL (ref 30.0–100.0)

## 2021-12-07 ENCOUNTER — Ambulatory Visit (INDEPENDENT_AMBULATORY_CARE_PROVIDER_SITE_OTHER): Payer: Medicaid Other | Admitting: Physician Assistant

## 2021-12-07 ENCOUNTER — Encounter: Payer: Self-pay | Admitting: Physician Assistant

## 2021-12-07 VITALS — BP 102/60 | HR 107 | Temp 97.6°F | Resp 16 | Ht 62.0 in | Wt 199.8 lb

## 2021-12-07 DIAGNOSIS — G4733 Obstructive sleep apnea (adult) (pediatric): Secondary | ICD-10-CM | POA: Diagnosis not present

## 2021-12-07 DIAGNOSIS — Z7189 Other specified counseling: Secondary | ICD-10-CM

## 2021-12-07 DIAGNOSIS — J449 Chronic obstructive pulmonary disease, unspecified: Secondary | ICD-10-CM | POA: Diagnosis not present

## 2021-12-07 NOTE — Progress Notes (Signed)
[place], [place] [address]  Pulmonary Sleep Medicine   Office Visit Note  Patient Name: Margaret Blackburn DOB: Sep 22, 1965 MRN [id number]  Date of Service: 12/13/2021  Complaints/HPI: Pt is here for follow up on cpap. She is wearing most nights. Fell asleep without it a few nights. Wearing it all night. Some improvement in sleepiness. No headaches or dryness. Hybrid FF mask is working well for her. She gets her supplies through [facility] and thought she was told she needed to be seen within 30days of setup for insurance purposes. Discussed typically a 30 day download is required within first 3 months showing compliance, not less than 30 days so she may need another visit for her compliance requirement. She understands and will follow up again if needed and will obtain the download at that time.   ROS  General: (-) fever, (-) chills, (-) night sweats, (-) weakness Skin: (-) rashes, (-) itching,. Eyes: (-) visual changes, (-) redness, (-) itching. Nose and Sinuses: (-) nasal stuffiness or itchiness, (-) postnasal drip, (-) nosebleeds, (-) sinus trouble. Mouth and Throat: (-) sore throat, (-) hoarseness. Neck: (-) swollen glands, (-) enlarged thyroid, (-) neck pain. Respiratory: + cough, (-) bloody sputum, + shortness of breath, + wheezing. Cardiovascular: - ankle swelling, (-) chest pain. Lymphatic: (-) lymph node enlargement. Neurologic: (-) numbness, (-) tingling. Psychiatric: (-) anxiety, (-) depression   Current Medication: Outpatient Encounter Medications as of 12/07/2021  Medication Sig Note   albuterol (VENTOLIN HFA) 108 (90 Base) MCG/ACT inhaler Inhale 2 puffs into the lungs every 6 (six) hours as needed for wheezing or shortness of breath.    ALPRAZolam (XANAX) 0.5 MG tablet Take 1 tablet (0.5 mg total) by mouth 3 (three) times daily as needed for anxiety or sleep.    anastrozole (ARIMIDEX) 1 MG tablet TAKE 1 TABLET BY MOUTH ONCE A DAY     azelastine (ASTELIN) 0.1 % nasal spray Place 1 spray into both nostrils 2 (two) times daily. Use in each nostril as directed    busPIRone (BUSPAR) 30 MG tablet Take 1 tablet (30 mg total) by mouth 2 (two) times daily.    Calcium Carb-Cholecalciferol (CALCIUM 600+D3 PO) Take 1 tablet by mouth in the morning and at bedtime. 08/25/2019: Pt does not know dose of vit d 3   cyclobenzaprine (FLEXERIL) 10 MG tablet Take 1 tablet (10 mg total) by mouth at bedtime. Take one tab po qhs for back spasm prn only    Fluticasone-Umeclidin-Vilant (TRELEGY ELLIPTA) 100-62.5-25 MCG/ACT AEPB Inhale 1 puff into the lungs daily.    ipratropium-albuterol (DUONEB) 0.5-2.5 (3) MG/3ML SOLN Take 3 mLs by nebulization every 6 (six) hours as needed.    levocetirizine (XYZAL) 5 MG tablet Take 1 tablet (5 mg total) by mouth every evening.    metFORMIN (GLUCOPHAGE) 500 MG tablet Take 1 tablet (500 mg total) by mouth 2 (two) times daily with a meal.    montelukast (SINGULAIR) 10 MG tablet Take 1 tablet (10 mg total) by mouth at bedtime.    Multiple Vitamins-Minerals (ALIVE WOMENS ENERGY PO) Take 1 Dose by mouth daily.    pregabalin (LYRICA) 150 MG capsule Take 1 capsule (150 mg total) by mouth 2 (two) times daily.    rosuvastatin (CRESTOR) 5 MG tablet Take 1 tablet (5 mg total) by mouth daily.    sertraline (ZOLOFT) 100 MG tablet Take 1.5 tablets (150 mg total) by mouth daily.    traZODone (DESYREL) 50 MG tablet Take 1-2  tablets (50-100 mg total) by mouth at bedtime as needed for sleep.    [DISCONTINUED] aspirin EC 81 MG tablet Take 81 mg by mouth daily. Swallow whole.    [DISCONTINUED] predniSONE (STERAPRED UNI-PAK 21 TAB) 10 MG (21) TBPK tablet Use as directed for 6 days    No facility-administered encounter medications on file as of 12/07/2021.    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   BREAST LUMPECTOMY WITH NEEDLE LOCALIZATION Right 03/03/2018   Procedure: BREAST LUMPECTOMY WITH NEEDLE LOCALIZATION;  Surgeon: Vickie Epley, MD;  Location: [facility] ORS;  Service: General;  Laterality: Right;   COLONOSCOPY WITH PROPOFOL N/A 08/31/2019   Procedure: COLONOSCOPY WITH PROPOFOL;  Surgeon: Jonathon Bellows, MD;  Location: [facility] ENDOSCOPY;  Service: Gastroenterology;  Laterality: N/A;   SENTINEL NODE BIOPSY Right 03/21/2018   Procedure: RIGHT SENTINEL LYMPH  NODE BIOPSY;  Surgeon: Vickie Epley, MD;  Location: [facility] ORS;  Service: General;  Laterality: Right;    Medical History: Past Medical History:  Diagnosis Date   Allergy    Arthritis    Breast cancer ([place]) 12/2017   right breast   COPD (chronic obstructive pulmonary disease) ([place])    Depression    Personal history of radiation therapy    Prediabetes    Sleep apnea    supposed to get CPAP 03/25/19    Family History: Family History  Problem Relation Age of Onset   Breast cancer Mother 85   Lung cancer Mother    Breast cancer Maternal Grandmother    Thyroid cancer Father     Social History: Social History   Socioeconomic History   Marital status: Single    Spouse name: Not on file   Number of children: 2   Years of education: Not on file   Highest education level: Not on file  Occupational History   Not on file  Tobacco Use   Smoking status: Every Day    Packs/day: 2.00    Years: 39.00    Total pack years: 78.00    Types: Cigarettes, E-cigarettes   Smokeless tobacco: Never   Tobacco comments:    Smokes a pack and a half daily  Vaping Use   Vaping Use: Former   Devices: used  2 weeks per pt  Substance and Sexual Activity   Alcohol use: No   Drug use: Yes    Types: Marijuana    Comment: occ   Sexual activity: Yes  Other Topics Concern   Not on file  Social History Narrative   Not on file   Social Determinants of Health    Financial Resource Strain: Not on file  Food Insecurity: Not on file  Transportation Needs: Not on file  Physical Activity: Not on file  Stress: Not on file  Social Connections: Not on file  Intimate Partner Violence: Not on file    Vital Signs: Blood pressure 102/60, pulse (!) 107, temperature 97.6 F (36.4 C), resp. rate 16, height _0  (1.575 m), weight 199 lb 12.8 oz (90.6 kg), SpO2 95 %.  Examination: General Appearance: The patient is well-developed, well-nourished, and in no distress. Skin: Gross inspection of skin unremarkable. Head: normocephalic, no gross deformities. Eyes: no gross deformities noted. ENT: ears appear grossly normal no exudates. Neck:  Supple. No thyromegaly. No LAD. Respiratory: Lungs clear to auscultation bilaterally. Cardiovascular: Normal S1 and S2 without murmur or rub. Extremities: No cyanosis. pulses are equal. Neurologic: Alert and oriented. No involuntary movements.  LABS: Recent Results (from the past 2160 hour(s))  ECHOCARDIOGRAM COMPLETE     Status: None   Collection Time: 11/29/21 10:31 AM  Result Value Ref Range   Ao pk vel 1.99 m/s   AV Area VTI 2.37 cm2   AR max vel 1.51 cm2   AV Mean grad 8.5 mmHg   AV Peak grad 15.8 mmHg   S' Lateral 3.00 cm   AV Area mean vel 1.37 cm2   Area-P 1/2 5.54 cm2  CBC with Differential/Platelet     Status: None   Collection Time: 12/05/21 10:53 AM  Result Value Ref Range   WBC 9.8 3.4 - 10.8 x10E3/uL   RBC 4.27 3.77 - 5.28 x10E6/uL   Hemoglobin 13.6 11.1 - 15.9 g/dL   Hematocrit 39.8 34.0 - 46.6 %   MCV 93 79 - 97 fL   MCH 31.9 26.6 - 33.0 pg   MCHC 34.2 31.5 - 35.7 g/dL   RDW 13.3 11.7 - 15.4 %   Platelets 279 150 - 450 x10E3/uL   Neutrophils 70 Not Estab. %   Lymphs 20 Not Estab. %   Monocytes 7 Not Estab. %   Eos 2 Not Estab. %   Basos 1 Not Estab. %   Neutrophils Absolute 6.9 1.4 - 7.0 x10E3/uL   Lymphocytes Absolute 2.0 0.7 - 3.1 x10E3/uL   Monocytes Absolute 0.7 0.1 - 0.9  x10E3/uL   EOS (ABSOLUTE) 0.2 0.0 - 0.4 x10E3/uL   Basophils Absolute 0.1 0.0 - 0.2 x10E3/uL   Immature Granulocytes 0 Not Estab. %   Immature Grans (Abs) 0.0 0.0 - 0.1 x10E3/uL  CMP14+EGFR     Status: Abnormal   Collection Time: 12/05/21 10:53 AM  Result Value Ref Range   Glucose 115 (H) 70 - 99 mg/dL   BUN 9 6 - 24 mg/dL   Creatinine, Ser 0.64 0.57 - 1.00 mg/dL   eGFR 104 >59 mL/min/1.73   BUN/Creatinine Ratio 14 9 - 23   Sodium 137 134 - 144 mmol/L   Potassium 4.1 3.5 - 5.2 mmol/L   Chloride 100 96 - 106 mmol/L   CO2 25 20 - 29 mmol/L   Calcium 9.1 8.7 - 10.2 mg/dL   Total Protein 6.6 6.0 - 8.5 g/dL   Albumin 4.4 3.8 - 4.9 g/dL   Globulin, Total 2.2 1.5 - 4.5 g/dL   Albumin/Globulin Ratio 2.0 1.2 - 2.2   Bilirubin Total 0.2 0.0 - 1.2 mg/dL   Alkaline Phosphatase 75 44 - 121 IU/L   AST 18 0 - 40 IU/L   ALT 20 0 - 32 IU/L  Lipid Profile     Status: None   Collection Time: 12/05/21 10:53 AM  Result Value Ref Range   Cholesterol, Total 154 100 - 199 mg/dL   Triglycerides 94 0 - 149 mg/dL   HDL 51 >39 mg/dL   VLDL Cholesterol Cal 17 5 - 40 mg/dL   LDL Chol Calc (NIH) 86 0 - 99 mg/dL   Chol/HDL Ratio 3.0 0.0 - 4.4 ratio    Comment:                                   T. Chol/HDL Ratio  Men  Women                               1/2 Avg.Risk  3.4    3.3                                   Avg.Risk  5.0    4.4                                2X Avg.Risk  9.6    7.1                                3X Avg.Risk 23.4   11.0   B12 and Folate Panel     Status: None   Collection Time: 12/05/21 10:53 AM  Result Value Ref Range   Vitamin B-12 567 232 - 1,245 pg/mL   Folate 15.7 >3.0 ng/mL    Comment: A serum folate concentration of less than 3.1 ng/mL is considered to represent clinical deficiency.   TSH + free T4     Status: Abnormal   Collection Time: 12/05/21 10:53 AM  Result Value Ref Range   TSH 0.164 (L) 0.450 - 4.500 uIU/mL   Free T4  1.15 0.82 - 1.77 ng/dL  Vitamin D (25 hydroxy)     Status: None   Collection Time: 12/05/21 10:53 AM  Result Value Ref Range   Vit D, 25-Hydroxy 31.2 30.0 - 100.0 ng/mL    Comment: Vitamin D deficiency has been defined by the [place] practice guideline as a level of serum 25-OH vitamin D less than 20 ng/mL (1,2). The Endocrine Society went on to further define vitamin D insufficiency as a level between 21 and 29 ng/mL (2). 1. IOM (Institute of Medicine). 2010. Dietary reference    intakes for calcium and D. [place]: The    [employer]. 2. Holick MF, Binkley [place], Bischoff-Ferrari HA, et al.    Evaluation, treatment, and prevention of vitamin D    deficiency: an Endocrine Society clinical practice    guideline. JCEM. 2011 Jul; 96(7):1911-30.   Iron, TIBC and Ferritin Panel     Status: Abnormal   Collection Time: 12/05/21 10:53 AM  Result Value Ref Range   Total Iron Binding Capacity 331 250 - 450 ug/dL   UIBC 294 131 - 425 ug/dL   Iron 37 27 - 159 ug/dL   Iron Saturation 11 (L) 15 - 55 %   Ferritin 63 15 - 150 ng/mL    Radiology: ECHOCARDIOGRAM COMPLETE  Result Date: 11/29/2021    ECHOCARDIOGRAM REPORT   Patient Name:   ADYSEN RAPHAEL Date of Exam: 11/29/2021 Medical Rec #:  [id number]       Height:       62.0 in Accession #:    [id number]      Weight:       198.0 lb Date of Birth:  01/27/1966      BSA:          1.904 m Patient Age:    56 years        BP:           121/83 mmHg Patient Gender: F  HR:           110 bpm. Exam Location:  [facility] Procedure: 2D Echo, Cardiac Doppler and Color Doppler Indications:     Abnormal ECG R94.31                  Aortic regurgitation I35.1  History:         Patient has prior history of Echocardiogram examinations, most                  recent 07/14/2019. COPD; Risk Factors:Sleep Apnea. Breast cancer.  Sonographer:     Sherrie Sport Referring Phys:  [id number] Jonetta Osgood Diagnosing Phys:  Kate Sable MD  Sonographer Comments: Suboptimal parasternal window. IMPRESSIONS  1. Left ventricular ejection fraction, by estimation, is 55 to 60%. The left ventricle has normal function. The left ventricle has no regional wall motion abnormalities. Left ventricular diastolic parameters are consistent with Grade I diastolic dysfunction (impaired relaxation).  2. Right ventricular systolic function is normal. The right ventricular size is normal.  3. The mitral valve is normal in structure. No evidence of mitral valve regurgitation.  4. The aortic valve was not well visualized. Aortic valve regurgitation is not visualized. FINDINGS  Left Ventricle: Left ventricular ejection fraction, by estimation, is 55 to 60%. The left ventricle has normal function. The left ventricle has no regional wall motion abnormalities. The left ventricular internal cavity size was normal in size. There is  no left ventricular hypertrophy. Left ventricular diastolic parameters are consistent with Grade I diastolic dysfunction (impaired relaxation). Right Ventricle: The right ventricular size is normal. No increase in right ventricular wall thickness. Right ventricular systolic function is normal. Left Atrium: Left atrial size was normal in size. Right Atrium: Right atrial size was normal in size. Pericardium: There is no evidence of pericardial effusion. Mitral Valve: The mitral valve is normal in structure. No evidence of mitral valve regurgitation. Tricuspid Valve: The tricuspid valve is normal in structure. Tricuspid valve regurgitation is not demonstrated. Aortic Valve: The aortic valve was not well visualized. Aortic valve regurgitation is not visualized. Aortic valve mean gradient measures 8.5 mmHg. Aortic valve peak gradient measures 15.8 mmHg. Aortic valve area, by VTI measures 2.37 cm. Pulmonic Valve: The pulmonic valve was normal in structure. Pulmonic valve regurgitation is not visualized. Aorta: The aortic root is normal  in size and structure. Venous: The inferior vena cava was not well visualized. IAS/Shunts: No atrial level shunt detected by color flow Doppler.  LEFT VENTRICLE PLAX 2D LVIDd:         4.10 cm   Diastology LVIDs:         3.00 cm   LV e' medial:    9.36 cm/s LV PW:         1.10 cm   LV E/e' medial:  7.7 LV IVS:        1.00 cm   LV e' lateral:   10.70 cm/s LVOT diam:     2.10 cm   LV E/e' lateral: 6.7 LV SV:         75 LV SV Index:   39 LVOT Area:     3.46 cm  RIGHT VENTRICLE RV Basal diam:  2.80 cm RV Mid diam:    2.30 cm RV S prime:     8.92 cm/s TAPSE (M-mode): 2.9 cm LEFT ATRIUM           Index       RIGHT ATRIUM  Index LA diam:      1.80 cm 0.95 cm/m  RA Area:     7.62 cm LA Vol (A2C): 15.2 ml 7.99 ml/m  RA Volume:   14.10 ml 7.41 ml/m LA Vol (A4C): 15.5 ml 8.14 ml/m  AORTIC VALVE AV Area (Vmax):    1.51 cm AV Area (Vmean):   1.37 cm AV Area (VTI):     2.37 cm AV Vmax:           199.00 cm/s AV Vmean:          135.000 cm/s AV VTI:            0.316 m AV Peak Grad:      15.8 mmHg AV Mean Grad:      8.5 mmHg LVOT Vmax:         86.50 cm/s LVOT Vmean:        53.500 cm/s LVOT VTI:          0.217 m LVOT/AV VTI ratio: 0.69  AORTA Ao Root diam: 2.90 cm MITRAL VALVE                TRICUSPID VALVE MV Area (PHT): 5.54 cm     TR Peak grad:   6.5 mmHg MV Decel Time: 137 msec     TR Vmax:        127.00 cm/s MV E velocity: 71.80 cm/s MV A velocity: 107.00 cm/s  SHUNTS MV E/A ratio:  0.67         Systemic VTI:  0.22 m                             Systemic Diam: 2.10 cm Kate Sable MD Electronically signed by Kate Sable MD Signature Date/Time: 11/29/2021/1:20:32 PM    Final     No results found.  ECHOCARDIOGRAM COMPLETE  Result Date: 11/29/2021    ECHOCARDIOGRAM REPORT   Patient Name:   GINNIFER CREELMAN Date of Exam: 11/29/2021 Medical Rec #:  [id number]       Height:       62.0 in Accession #:    [id number]      Weight:       198.0 lb Date of Birth:  [birth year]      BSA:          1.904 m Patient  Age:    74 years        BP:           121/83 mmHg Patient Gender: F               HR:           110 bpm. Exam Location:  [facility] Procedure: 2D Echo, Cardiac Doppler and Color Doppler Indications:     Abnormal ECG R94.31                  Aortic regurgitation I35.1  History:         Patient has prior history of Echocardiogram examinations, most                  recent 07/14/2019. COPD; Risk Factors:Sleep Apnea. Breast cancer.  Sonographer:     Sherrie Sport Referring Phys:  [id number] Jonetta Osgood Diagnosing Phys: Kate Sable MD  Sonographer Comments: Suboptimal parasternal window. IMPRESSIONS  1. Left ventricular ejection fraction, by estimation, is 55 to 60%. The left ventricle has normal function. The left ventricle has no regional wall  motion abnormalities. Left ventricular diastolic parameters are consistent with Grade I diastolic dysfunction (impaired relaxation).  2. Right ventricular systolic function is normal. The right ventricular size is normal.  3. The mitral valve is normal in structure. No evidence of mitral valve regurgitation.  4. The aortic valve was not well visualized. Aortic valve regurgitation is not visualized. FINDINGS  Left Ventricle: Left ventricular ejection fraction, by estimation, is 55 to 60%. The left ventricle has normal function. The left ventricle has no regional wall motion abnormalities. The left ventricular internal cavity size was normal in size. There is  no left ventricular hypertrophy. Left ventricular diastolic parameters are consistent with Grade I diastolic dysfunction (impaired relaxation). Right Ventricle: The right ventricular size is normal. No increase in right ventricular wall thickness. Right ventricular systolic function is normal. Left Atrium: Left atrial size was normal in size. Right Atrium: Right atrial size was normal in size. Pericardium: There is no evidence of pericardial effusion. Mitral Valve: The mitral valve is normal in structure. No evidence of mitral  valve regurgitation. Tricuspid Valve: The tricuspid valve is normal in structure. Tricuspid valve regurgitation is not demonstrated. Aortic Valve: The aortic valve was not well visualized. Aortic valve regurgitation is not visualized. Aortic valve mean gradient measures 8.5 mmHg. Aortic valve peak gradient measures 15.8 mmHg. Aortic valve area, by VTI measures 2.37 cm. Pulmonic Valve: The pulmonic valve was normal in structure. Pulmonic valve regurgitation is not visualized. Aorta: The aortic root is normal in size and structure. Venous: The inferior vena cava was not well visualized. IAS/Shunts: No atrial level shunt detected by color flow Doppler.  LEFT VENTRICLE PLAX 2D LVIDd:         4.10 cm   Diastology LVIDs:         3.00 cm   LV e' medial:    9.36 cm/s LV PW:         1.10 cm   LV E/e' medial:  7.7 LV IVS:        1.00 cm   LV e' lateral:   10.70 cm/s LVOT diam:     2.10 cm   LV E/e' lateral: 6.7 LV SV:         75 LV SV Index:   39 LVOT Area:     3.46 cm  RIGHT VENTRICLE RV Basal diam:  2.80 cm RV Mid diam:    2.30 cm RV S prime:     8.92 cm/s TAPSE (M-mode): 2.9 cm LEFT ATRIUM           Index       RIGHT ATRIUM          Index LA diam:      1.80 cm 0.95 cm/m  RA Area:     7.62 cm LA Vol (A2C): 15.2 ml 7.99 ml/m  RA Volume:   14.10 ml 7.41 ml/m LA Vol (A4C): 15.5 ml 8.14 ml/m  AORTIC VALVE AV Area (Vmax):    1.51 cm AV Area (Vmean):   1.37 cm AV Area (VTI):     2.37 cm AV Vmax:           199.00 cm/s AV Vmean:          135.000 cm/s AV VTI:            0.316 m AV Peak Grad:      15.8 mmHg AV Mean Grad:      8.5 mmHg LVOT Vmax:         86.50 cm/s  LVOT Vmean:        53.500 cm/s LVOT VTI:          0.217 m LVOT/AV VTI ratio: 0.69  AORTA Ao Root diam: 2.90 cm MITRAL VALVE                TRICUSPID VALVE MV Area (PHT): 5.54 cm     TR Peak grad:   6.5 mmHg MV Decel Time: 137 msec     TR Vmax:        127.00 cm/s MV E velocity: 71.80 cm/s MV A velocity: 107.00 cm/s  SHUNTS MV E/A ratio:  0.67         Systemic VTI:   0.22 m                             Systemic Diam: 2.10 cm Kate Sable MD Electronically signed by Kate Sable MD Signature Date/Time: 11/29/2021/1:20:32 PM    Final       Assessment and Plan: Patient Active Problem List   Diagnosis Date Noted   Osteoarthritis 10/22/2021   Back pain 10/22/2021   Lumbar radicular pain 12/08/2019   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) 10/27/2019   Lumbar facet arthropathy 10/27/2019   Trochanteric bursitis of both hips 10/27/2019   Chronic pain syndrome 10/27/2019   Respiratory tract infection due to COVID-19 virus 03/27/2019   Cough 03/27/2019   Invasive carcinoma of breast ([place]) 03/24/2018   Goals of care, counseling/discussion 03/24/2018   Malignant neoplasm of right breast greater than or equal to 2 cm in greatest dimension [facility])    Mass of right breast 02/24/2018    1. OSA (obstructive sleep apnea) Continue cpap nightly  2. CPAP use counseling CPAP couseling-Discussed importance of adequate CPAP use as well as proper care and cleaning techniques of machine and all supplies.  3. Chronic obstructive pulmonary disease, unspecified COPD type ([place]) Continue inhalers as prescribed   General Counseling: I have discussed the findings of the evaluation and examination with [place].  I have also discussed any further diagnostic evaluation thatmay be needed or ordered today. Johanna verbalizes understanding of the findings of todays visit. We also reviewed her medications today and discussed drug interactions and side effects including but not limited excessive drowsiness and altered mental states. We also discussed that there is always a risk not just to her but also people around her. she has been encouraged to call the office with any questions or concerns that should arise related to todays visit.  No orders of the defined types were placed in this encounter.    Time spent: 30  I have personally obtained a history, examined the  patient, evaluated laboratory and imaging results, formulated the assessment and plan and placed orders. This patient was seen by Drema Dallas, PA-C in collaboration with Dr. Devona Konig as a part of collaborative care agreement.     Allyne Gee, MD [facility] Pulmonary and Critical Care Sleep medicine

## 2021-12-09 ENCOUNTER — Encounter: Payer: Self-pay | Admitting: Nurse Practitioner

## 2021-12-14 ENCOUNTER — Ambulatory Visit (INDEPENDENT_AMBULATORY_CARE_PROVIDER_SITE_OTHER): Payer: Medicaid Other | Admitting: Nurse Practitioner

## 2021-12-14 ENCOUNTER — Encounter: Payer: Self-pay | Admitting: Nurse Practitioner

## 2021-12-14 VITALS — BP 106/65 | HR 88 | Temp 97.7°F | Resp 16 | Ht 62.0 in | Wt 197.0 lb

## 2021-12-14 DIAGNOSIS — R300 Dysuria: Secondary | ICD-10-CM

## 2021-12-14 DIAGNOSIS — M1990 Unspecified osteoarthritis, unspecified site: Secondary | ICD-10-CM | POA: Diagnosis not present

## 2021-12-14 DIAGNOSIS — Z0001 Encounter for general adult medical examination with abnormal findings: Secondary | ICD-10-CM

## 2021-12-14 DIAGNOSIS — Z853 Personal history of malignant neoplasm of breast: Secondary | ICD-10-CM | POA: Diagnosis not present

## 2021-12-14 NOTE — Progress Notes (Signed)
[facility] [place], [place] [address]  Internal MEDICINE  Office Visit Note  Patient Name: Margaret Blackburn  [id number]  [id number]  Date of Service: 12/14/2021  Chief Complaint  Patient presents with   Annual Exam   Depression    HPI Kiara presents for an annual well visit and physical exam. 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  --overdue to follow up with oncology and has been off the anastrozole. --overdue for mammogram which is done with oncology, patient will call to schedule with them.  --lung cancer screening was done on 05/30/21, due again in April next year. --pap smear due in October next year.  --last routine colonoscopy was done in 2021, due for repeat in 2026 Chronic pain has improved with natural supplements like turmeric curcumin.  --recent labs discussed with patient -- slightly low TSH but has no history of thyroid issues. CBC, vitamin D, iron panel, and cholesterol are normal. B12 and folate are also normal. Metabolic panel is normal except for slightly elevated fasting glucose, she has prediabetes.  Overall labs are improved.       Current Medication: Outpatient Encounter Medications as of 12/14/2021  Medication Sig Note   albuterol (VENTOLIN HFA) 108 (90 Base) MCG/ACT inhaler Inhale 2 puffs into the lungs every 6 (six) hours as needed for wheezing or shortness of breath.    ALPRAZolam (XANAX) 0.5 MG tablet Take 1 tablet (0.5 mg total) by mouth 3 (three) times daily as needed for anxiety or sleep.    anastrozole (ARIMIDEX) 1 MG tablet TAKE 1 TABLET BY MOUTH ONCE A DAY    azelastine (ASTELIN) 0.1 % nasal spray Place 1 spray into both nostrils 2 (two) times daily. Use in each nostril as directed    busPIRone (BUSPAR) 30 MG tablet Take 1 tablet (30 mg  total) by mouth 2 (two) times daily.    Calcium Carb-Cholecalciferol (CALCIUM 600+D3 PO) Take 1 tablet by mouth in the morning and at bedtime. 08/25/2019: Pt does not know dose of vit d 3   cyclobenzaprine (FLEXERIL) 10 MG tablet Take 1 tablet (10 mg total) by mouth at bedtime. Take one tab po qhs for back spasm prn only    Fluticasone-Umeclidin-Vilant (TRELEGY ELLIPTA) 100-62.5-25 MCG/ACT AEPB Inhale 1 puff into the lungs daily.    ipratropium-albuterol (DUONEB) 0.5-2.5 (3) MG/3ML SOLN Take 3 mLs by nebulization every 6 (six) hours as needed.    levocetirizine (XYZAL) 5 MG tablet Take 1 tablet (5 mg total) by mouth every evening.    metFORMIN (GLUCOPHAGE) 500 MG tablet Take 1 tablet (500 mg total) by mouth 2 (two) times daily with a meal.    montelukast (SINGULAIR) 10 MG tablet Take 1 tablet (10 mg total) by mouth at bedtime.    Multiple Vitamins-Minerals (ALIVE WOMENS ENERGY PO) Take 1 Dose by mouth daily.    pregabalin (LYRICA) 150 MG capsule Take 1 capsule (150 mg total) by mouth 2 (two) times daily.    rosuvastatin (CRESTOR) 5 MG tablet Take 1 tablet (5 mg total) by mouth daily.    sertraline (ZOLOFT) 100 MG tablet Take 1.5 tablets (150 mg total) by mouth daily.    traZODone (DESYREL) 50 MG tablet Take 1-2 tablets (50-100 mg total) by mouth at bedtime as needed for sleep.    No facility-administered encounter medications on file as  of 12/14/2021.    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   BREAST LUMPECTOMY WITH NEEDLE LOCALIZATION Right 03/03/2018   Procedure: BREAST LUMPECTOMY WITH NEEDLE LOCALIZATION;  Surgeon: Vickie Epley, MD;  Location: [facility] ORS;  Service: General;  Laterality: Right;   COLONOSCOPY WITH PROPOFOL N/A 08/31/2019   Procedure: COLONOSCOPY WITH PROPOFOL;  Surgeon:  Jonathon Bellows, MD;  Location: [facility] ENDOSCOPY;  Service: Gastroenterology;  Laterality: N/A;   SENTINEL NODE BIOPSY Right 03/21/2018   Procedure: RIGHT SENTINEL LYMPH  NODE BIOPSY;  Surgeon: Vickie Epley, MD;  Location: [facility] ORS;  Service: General;  Laterality: Right;    Medical History: Past Medical History:  Diagnosis Date   Allergy    Arthritis    Breast cancer ([place]) 12/2017   right breast   COPD (chronic obstructive pulmonary disease) ([place])    Depression    Personal history of radiation therapy    Prediabetes    Sleep apnea    supposed to get CPAP 03/25/19    Family History: Family History  Problem Relation Age of Onset   Breast cancer Mother 44   Lung cancer Mother    Breast cancer Maternal Grandmother    Thyroid cancer Father     Social History   Socioeconomic History   Marital status: Single    Spouse name: Not on file   Number of children: 2   Years of education: Not on file   Highest education level: Not on file  Occupational History   Not on file  Tobacco Use   Smoking status: Every Day    Packs/day: 2.00    Years: 39.00    Total pack years: 78.00    Types: Cigarettes, E-cigarettes   Smokeless tobacco: Never   Tobacco comments:    Smokes a pack and a half daily  Vaping Use   Vaping Use: Former   Devices: used  2 weeks per pt  Substance and Sexual Activity   Alcohol use: No   Drug use: Yes    Types: Marijuana    Comment: occ   Sexual activity: Yes  Other Topics Concern   Not on file  Social History Narrative   Not on file   Social Determinants of Health   Financial Resource Strain: Not on file  Food Insecurity: Not on file  Transportation Needs: Not on file  Physical Activity: Not on file  Stress: Not on file  Social Connections: Not on file  Intimate Partner Violence: Not on file      Review of Systems  Constitutional:  Negative for activity change, appetite change, chills, fatigue, fever and unexpected weight change.  HENT:  Negative.  Negative for congestion, ear pain, rhinorrhea, sore throat and trouble swallowing.   Eyes: Negative.   Respiratory: Negative.  Negative for cough, chest tightness, shortness of breath and wheezing.   Cardiovascular: Negative.  Negative for chest pain.  Gastrointestinal: Negative.  Negative for abdominal pain, blood in stool, constipation, diarrhea, nausea and vomiting.  Endocrine: Negative.   Genitourinary: Negative.  Negative for difficulty urinating, dysuria, frequency, hematuria and urgency.  Musculoskeletal: Negative.  Negative for arthralgias, back pain, joint swelling, myalgias and neck pain.  Skin: Negative.  Negative for rash and wound.  Allergic/Immunologic: Negative.  Negative for immunocompromised state.  Neurological: Negative.  Negative for dizziness, seizures, numbness and headaches.  Hematological: Negative.   Psychiatric/Behavioral:  Positive for sleep disturbance. Negative for behavioral problems, self-injury and suicidal ideas. The patient is nervous/anxious.     Vital Signs: BP 106/65   Pulse 88   Temp 97.7 F (36.5 C)   Resp 16   Ht '5\' 2"'$  (1.575 m)   Wt 197 lb (89.4 kg)   SpO2 98%   BMI 36.03 kg/m    Physical Exam Vitals reviewed.  Constitutional:      General: She is awake. She is not in acute distress.    Appearance: Normal appearance. She is well-developed and well-groomed. She is obese. She is not ill-appearing or diaphoretic.  HENT:     Head: Normocephalic and atraumatic.     Right Ear: Tympanic membrane, ear canal and external ear normal.     Left Ear: Tympanic membrane, ear canal and external ear normal.     Nose: Nose normal. No congestion or rhinorrhea.     Mouth/Throat:     Lips: Pink.     Mouth: Mucous membranes are moist.     Pharynx: Oropharynx is clear. Uvula midline. No oropharyngeal exudate or posterior oropharyngeal erythema.  Eyes:     General: Lids are normal. Vision grossly intact. Gaze aligned appropriately. No scleral  icterus.       Right eye: No discharge.        Left eye: No discharge.     Extraocular Movements: Extraocular movements intact.     Conjunctiva/sclera: Conjunctivae normal.     Pupils: Pupils are equal, round, and reactive to light.     Funduscopic exam:    Right eye: Red reflex present.        Left eye: Red reflex present. Neck:     Thyroid: No thyromegaly.     Vascular: No carotid bruit or JVD.     Trachea: Trachea and phonation normal. No tracheal deviation.  Cardiovascular:     Rate and Rhythm: Normal rate and regular rhythm.     Pulses: Normal pulses.     Heart sounds: Normal heart sounds, S1 normal and S2 normal. No murmur heard.    No friction rub. No gallop.  Pulmonary:     Effort: Pulmonary effort is normal. No accessory muscle usage or respiratory distress.     Breath sounds: Normal breath sounds and air entry. No stridor. No wheezing or rales.  Chest:     Chest wall: No tenderness.     Comments: Declined clinical breast exam, gets annual mammograms.  Abdominal:     General: Bowel sounds are normal. There is no distension.     Palpations: Abdomen is soft. There is no shifting dullness, fluid wave, mass or pulsatile mass.     Tenderness: There is no abdominal tenderness. There is no guarding or rebound.  Musculoskeletal:        General: No tenderness or deformity. Normal range of motion.     Cervical back: Normal range of motion and neck supple.  Lymphadenopathy:     Cervical: No cervical adenopathy.  Skin:    General: Skin is warm and dry.     Capillary Refill: Capillary refill takes less than 2 seconds.     Coloration: Skin is not pale.     Findings: No erythema or rash.  Neurological:     Mental Status: She is alert and oriented to person, place, and time.     Cranial Nerves: No cranial nerve deficit.  Motor: No abnormal muscle tone.     Coordination: Coordination normal.     Gait: Gait normal.     Deep Tendon Reflexes: Reflexes are normal and symmetric.   Psychiatric:        Mood and Affect: Mood and affect normal.        Behavior: Behavior normal. Behavior is cooperative.        Thought Content: Thought content normal.        Judgment: Judgment normal.        Assessment/Plan: 1. Encounter for routine adult health examination with abnormal findings Age-appropriate preventive screenings and vaccinations discussed, annual physical exam completed. Routine labs for health maintenance drawn prior to office visit and results discussed with patient today. PHM updated.   2. Inflammatory arthropathy Overall improved with natural supplements, like turmeric curcumin. No longer taking any prescription pain medications or prescription NSAIDs. Continue current regimen as discussed.   3. Dysuria Routine urinalysis done - UA/M w/rflx Culture, Routine - Microscopic Examination - Urine Culture, Reflex  4. History of invasive breast cancer Screening mammogram ordered. Also patient will call oncology and schedule appointment so that she can get back on anastrozole.  - MM 3D SCREEN BREAST BILATERAL; Future      General Counseling: Amran verbalizes understanding of the findings of todays visit and agrees with plan of treatment. I have discussed any further diagnostic evaluation that may be needed or ordered today. We also reviewed her medications today. she has been encouraged to call the office with any questions or concerns that should arise related to todays visit.    Orders Placed This Encounter  Procedures   Microscopic Examination   Urine Culture, Reflex   MM 3D SCREEN BREAST BILATERAL   UA/M w/rflx Culture, Routine    No orders of the defined types were placed in this encounter.   Return in about 7 weeks (around 01/31/2022) for F/U, anxiety med refill, Akirra Lacerda PCP.   Total time spent:30 Minutes Time spent includes review of chart, medications, test results, and follow up plan with the patient.   [place] Controlled Substance  Database was reviewed by me.  This patient was seen by Jonetta Osgood, FNP-C in collaboration with Dr. Clayborn Bigness as a part of collaborative care agreement.  Dareon Nunziato R. Valetta Fuller, MSN, FNP-C Internal medicine

## 2021-12-16 LAB — UA/M W/RFLX CULTURE, ROUTINE
Bilirubin, UA: NEGATIVE
Glucose, UA: NEGATIVE
Ketones, UA: NEGATIVE
Leukocytes,UA: NEGATIVE
Nitrite, UA: NEGATIVE
Protein,UA: NEGATIVE
RBC, UA: NEGATIVE
Specific Gravity, UA: 1.021 (ref 1.005–1.030)
Urobilinogen, Ur: 1 mg/dL (ref 0.2–1.0)
pH, UA: 6.5 (ref 5.0–7.5)

## 2021-12-16 LAB — MICROSCOPIC EXAMINATION
Casts: NONE SEEN /lpf
Epithelial Cells (non renal): >10 /hpf — AB (ref 0–10)
RBC, Urine: NONE SEEN /hpf (ref 0–2)

## 2021-12-16 LAB — URINE CULTURE, REFLEX

## 2021-12-21 ENCOUNTER — Other Ambulatory Visit: Payer: Self-pay | Admitting: Nurse Practitioner

## 2021-12-21 DIAGNOSIS — Z760 Encounter for issue of repeat prescription: Secondary | ICD-10-CM

## 2022-01-16 ENCOUNTER — Other Ambulatory Visit: Payer: Self-pay | Admitting: Nurse Practitioner

## 2022-01-16 DIAGNOSIS — Z760 Encounter for issue of repeat prescription: Secondary | ICD-10-CM

## 2022-01-18 ENCOUNTER — Encounter: Payer: Self-pay | Admitting: Nurse Practitioner

## 2022-01-18 ENCOUNTER — Ambulatory Visit (INDEPENDENT_AMBULATORY_CARE_PROVIDER_SITE_OTHER): Payer: Medicaid Other | Admitting: Nurse Practitioner

## 2022-01-18 VITALS — BP 104/81 | HR 97 | Temp 98.5°F | Resp 16 | Ht 62.0 in | Wt 193.6 lb

## 2022-01-18 DIAGNOSIS — R7303 Prediabetes: Secondary | ICD-10-CM | POA: Insufficient documentation

## 2022-01-18 DIAGNOSIS — J4489 Other specified chronic obstructive pulmonary disease: Secondary | ICD-10-CM | POA: Insufficient documentation

## 2022-01-18 DIAGNOSIS — Z760 Encounter for issue of repeat prescription: Secondary | ICD-10-CM

## 2022-01-18 DIAGNOSIS — Z79899 Other long term (current) drug therapy: Secondary | ICD-10-CM

## 2022-01-18 DIAGNOSIS — M1990 Unspecified osteoarthritis, unspecified site: Secondary | ICD-10-CM

## 2022-01-18 DIAGNOSIS — F411 Generalized anxiety disorder: Secondary | ICD-10-CM

## 2022-01-18 DIAGNOSIS — F321 Major depressive disorder, single episode, moderate: Secondary | ICD-10-CM | POA: Insufficient documentation

## 2022-01-18 LAB — POCT URINE DRUG SCREEN
Methylenedioxyamphetamine: NOT DETECTED
POC Amphetamine UR: NOT DETECTED
POC BENZODIAZEPINES UR: POSITIVE — AB
POC Barbiturate UR: NOT DETECTED
POC Cocaine UR: NOT DETECTED
POC Ecstasy UR: NOT DETECTED
POC Marijuana UR: POSITIVE — AB
POC Methadone UR: NOT DETECTED
POC Methamphetamine UR: NOT DETECTED
POC Opiate Ur: NOT DETECTED
POC Oxycodone UR: NOT DETECTED
POC PHENCYCLIDINE UR: NOT DETECTED
POC TRICYCLICS UR: POSITIVE — AB

## 2022-01-18 MED ORDER — DICLOFENAC SODIUM 75 MG PO TBEC: 75.0000 mg | DELAYED_RELEASE_TABLET | Freq: Two times a day (BID) | ORAL | 1 refills | Status: DC

## 2022-01-18 MED ORDER — CYCLOBENZAPRINE HCL 10 MG PO TABS: 10.0000 mg | ORAL_TABLET | Freq: Three times a day (TID) | ORAL | 0 refills | Status: DC | PRN

## 2022-01-18 MED ORDER — HYDROCODONE-ACETAMINOPHEN 5-325 MG PO TABS: 1.0000 | ORAL_TABLET | Freq: Four times a day (QID) | ORAL | 0 refills | Status: AC | PRN

## 2022-01-18 MED ORDER — ALPRAZOLAM 0.5 MG PO TABS: 0.5000 mg | ORAL_TABLET | Freq: Two times a day (BID) | ORAL | 2 refills | Status: DC | PRN

## 2022-01-18 NOTE — Progress Notes (Signed)
[facility] [place], [place] [address]  Internal MEDICINE  Office Visit Note  Patient Name: Margaret Blackburn  [id number]  [id number]  Date of Service: 01/18/2022  Chief Complaint  Patient presents with   Medication Refill    Hydrocodone     HPI Nisa presents for an acute sick visit for acute on chronic pain of multiple joints --arthritic and achy in nature with neuropathic pain as well esp sciatica down both legs.  --onset: increased pain for past 2 weeks  --location: feet, hands, hips back upper and lower  --duration: constant  --Associated sx: sciatica down both legs  --what alleviates: nothing --what aggravates: movement --Severity:6 out of 10     Current Medication:  Outpatient Encounter Medications as of 01/18/2022  Medication Sig Note   albuterol (VENTOLIN HFA) 108 (90 Base) MCG/ACT inhaler Inhale 2 puffs into the lungs every 6 (six) hours as needed for wheezing or shortness of breath.    anastrozole (ARIMIDEX) 1 MG tablet TAKE 1 TABLET BY MOUTH ONCE A DAY    azelastine (ASTELIN) 0.1 % nasal spray Place 1 spray into both nostrils 2 (two) times daily. Use in each nostril as directed    busPIRone (BUSPAR) 30 MG tablet Take 1 tablet (30 mg total) by mouth 2 (two) times daily.    Calcium Carb-Cholecalciferol (CALCIUM 600+D3 PO) Take 1 tablet by mouth in the morning and at bedtime. 08/25/2019: Pt does not know dose of vit d 3   cyclobenzaprine (FLEXERIL) 10 MG tablet Take 1 tablet (10 mg total) by mouth 3 (three) times daily as needed for muscle spasms.    diclofenac (VOLTAREN) 75 MG EC tablet Take 1 tablet (75 mg total) by mouth 2 (two) times daily.    Fluticasone-Umeclidin-Vilant (TRELEGY ELLIPTA) 100-62.5-25 MCG/ACT AEPB Inhale 1 puff into the lungs daily.    HYDROcodone-acetaminophen (NORCO/VICODIN) 5-325 MG tablet Take 1 tablet by mouth every 6 (six) hours as needed for up to 5 days for moderate pain.    ipratropium-albuterol (DUONEB)  0.5-2.5 (3) MG/3ML SOLN Take 3 mLs by nebulization every 6 (six) hours as needed.    levocetirizine (XYZAL) 5 MG tablet Take 1 tablet (5 mg total) by mouth every evening.    metFORMIN (GLUCOPHAGE) 500 MG tablet Take 1 tablet (500 mg total) by mouth 2 (two) times daily with a meal.    montelukast (SINGULAIR) 10 MG tablet Take 1 tablet (10 mg total) by mouth at bedtime.    Multiple Vitamins-Minerals (ALIVE WOMENS ENERGY PO) Take 1 Dose by mouth daily.    pregabalin (LYRICA) 150 MG capsule Take 1 capsule (150 mg total) by mouth 2 (two) times daily.    rosuvastatin (CRESTOR) 5 MG tablet Take 1 tablet (5 mg total) by mouth daily.    sertraline (ZOLOFT) 100 MG tablet Take 1.5 tablets (150 mg total) by mouth daily.    traZODone (DESYREL) 50 MG tablet Take 1-2 tablets (50-100 mg total) by mouth at bedtime as needed for sleep.    [DISCONTINUED] ALPRAZolam (XANAX) 0.5 MG tablet Take 1 tablet (0.5 mg total) by mouth 3 (three) times daily as needed for anxiety or sleep.    [DISCONTINUED] cyclobenzaprine (FLEXERIL) 10 MG tablet Take 1 tablet (10 mg total) by mouth at bedtime. Take one tab po qhs for back spasm prn only    ALPRAZolam (XANAX) 0.5 MG tablet Take 1 tablet (0.5 mg total) by mouth 2 (two) times daily as needed for anxiety or sleep. May take 1 extra  tablet for severe anxiety/panic daily prn    No facility-administered encounter medications on file as of 01/18/2022.      Medical History: Past Medical History:  Diagnosis Date   Allergy    Arthritis    Breast cancer ([place]) 12/2017   right breast   COPD (chronic obstructive pulmonary disease) ([place])    Depression    Personal history of radiation therapy    Prediabetes    Sleep apnea    supposed to get CPAP 03/25/19     Vital Signs: BP 104/81   Pulse 97 Comment: 135  Temp 98.5 F (36.9 C)   Resp 16   Ht '5\' 2"'$  (1.575 m)   Wt 193 lb 9.6 oz (87.8 kg)   SpO2 96%   BMI 35.41 kg/m    Review of Systems  Constitutional:  Positive for  fatigue. Negative for chills and unexpected weight change.  HENT:  Negative for congestion, rhinorrhea, sneezing and sore throat.   Respiratory: Negative.  Negative for cough, chest tightness, shortness of breath and wheezing.   Cardiovascular:  Positive for palpitations. Negative for chest pain.  Gastrointestinal:  Negative for abdominal pain, constipation, diarrhea, nausea and vomiting.  Musculoskeletal:  Positive for arthralgias, back pain and myalgias. Negative for joint swelling and neck pain.  Skin:  Negative for rash.  Neurological: Negative.   Psychiatric/Behavioral:  Positive for behavioral problems (Depression) and sleep disturbance. Negative for self-injury and suicidal ideas. The patient is nervous/anxious.     Physical Exam Vitals reviewed.  Constitutional:      General: She is not in acute distress.    Appearance: Normal appearance. She is obese. She is not ill-appearing.  HENT:     Head: Normocephalic and atraumatic.  Eyes:     Pupils: Pupils are equal, round, and reactive to light.  Cardiovascular:     Rate and Rhythm: Regular rhythm. Tachycardia present.     Heart sounds: Normal heart sounds. No murmur heard. Pulmonary:     Effort: Pulmonary effort is normal. No respiratory distress.     Breath sounds: Normal breath sounds. No wheezing or rales.  Skin:    Capillary Refill: Capillary refill takes less than 2 seconds.  Neurological:     Mental Status: She is alert and oriented to person, place, and time.       Assessment/Plan: 1. Inflammatory arthropathy Small amount of hydrocodone prescribed. Patient instructed to space out and not take with alprazolam. Encouraged patient to start and take diclofenac twice daily scheduled to help decrease inflammation and pain and the muscle relaxant will help with the sciatica and spasms. - HYDROcodone-acetaminophen (NORCO/VICODIN) 5-325 MG tablet; Take 1 tablet by mouth every 6 (six) hours as needed for up to 5 days for  moderate pain.  Dispense: 20 tablet; Refill: 0 - cyclobenzaprine (FLEXERIL) 10 MG tablet; Take 1 tablet (10 mg total) by mouth 3 (three) times daily as needed for muscle spasms.  Dispense: 60 tablet; Refill: 0 - diclofenac (VOLTAREN) 75 MG EC tablet; Take 1 tablet (75 mg total) by mouth 2 (two) times daily.  Dispense: 60 tablet; Refill: 1  2. Encounter for long-term current use of medication Positive for THC, benzo and TCAs.  - POCT Urine Drug Screen  3. Medication refill - ALPRAZolam (XANAX) 0.5 MG tablet; Take 1 tablet (0.5 mg total) by mouth 2 (two) times daily as needed for anxiety or sleep. May take 1 extra tablet for severe anxiety/panic daily prn  Dispense: 70 tablet; Refill: 2  4.  GAD (generalized anxiety disorder) Continue alprazolam except frequency changed to twice daily as needed with an extra 10 tabs for episodes of severe anxiety or panic.    General Counseling: Reanna verbalizes understanding of the findings of todays visit and agrees with plan of treatment. I have discussed any further diagnostic evaluation that may be needed or ordered today. We also reviewed her medications today. she has been encouraged to call the office with any questions or concerns that should arise related to todays visit.    Counseling:    Orders Placed This Encounter  Procedures   POCT Urine Drug Screen    Meds ordered this encounter  Medications   ALPRAZolam (XANAX) 0.5 MG tablet    Sig: Take 1 tablet (0.5 mg total) by mouth 2 (two) times daily as needed for anxiety or sleep. May take 1 extra tablet for severe anxiety/panic daily prn    Dispense:  70 tablet    Refill:  2   HYDROcodone-acetaminophen (NORCO/VICODIN) 5-325 MG tablet    Sig: Take 1 tablet by mouth every 6 (six) hours as needed for up to 5 days for moderate pain.    Dispense:  20 tablet    Refill:  0    New script short term for acute on chronic joint pains. Discussed risks of taking opioid and benzo and spacing out the  medication.   cyclobenzaprine (FLEXERIL) 10 MG tablet    Sig: Take 1 tablet (10 mg total) by mouth 3 (three) times daily as needed for muscle spasms.    Dispense:  60 tablet    Refill:  0    Note change in frequency, please fill asap   diclofenac (VOLTAREN) 75 MG EC tablet    Sig: Take 1 tablet (75 mg total) by mouth 2 (two) times daily.    Dispense:  60 tablet    Refill:  1    Return for previously scheduled, F/U, Jleigh Striplin PCP in a couple of weeks.  [place] Controlled Substance Database was reviewed by me for overdose risk score (ORS)  Time spent:30 Minutes Time spent with patient included reviewing progress notes, labs, imaging studies, and discussing plan for follow up.   This patient was seen by Jonetta Osgood, FNP-C in collaboration with Dr. Clayborn Bigness as a part of collaborative care agreement.  Citlalic Norlander R. Valetta Fuller, MSN, FNP-C Internal Medicine

## 2022-01-20 ENCOUNTER — Encounter: Payer: Self-pay | Admitting: Nurse Practitioner

## 2022-02-01 ENCOUNTER — Telehealth: Payer: Self-pay | Admitting: Nurse Practitioner

## 2022-02-01 ENCOUNTER — Ambulatory Visit: Payer: Medicaid Other | Admitting: Nurse Practitioner

## 2022-02-01 NOTE — Telephone Encounter (Signed)
Patient not seen today due to unable pay towards high balance-Toni

## 2022-02-09 ENCOUNTER — Encounter: Payer: Self-pay | Admitting: Nurse Practitioner

## 2022-02-12 ENCOUNTER — Emergency Department (HOSPITAL_COMMUNITY)
Admission: EM | Admit: 2022-02-12 | Discharge: 2022-02-13 | Disposition: A | Discharge status: Home / Self Care | Payer: Medicaid Other | Attending: Emergency Medicine | Admitting: Emergency Medicine

## 2022-02-12 DIAGNOSIS — Z1152 Encounter for screening for COVID-19: Secondary | ICD-10-CM | POA: Diagnosis not present

## 2022-02-12 DIAGNOSIS — F332 Major depressive disorder, recurrent severe without psychotic features: Secondary | ICD-10-CM | POA: Diagnosis present

## 2022-02-12 DIAGNOSIS — F411 Generalized anxiety disorder: Secondary | ICD-10-CM | POA: Diagnosis present

## 2022-02-12 DIAGNOSIS — T1491XA Suicide attempt, initial encounter: Secondary | ICD-10-CM | POA: Diagnosis not present

## 2022-02-12 DIAGNOSIS — T50902A Poisoning by unspecified drugs, medicaments and biological substances, intentional self-harm, initial encounter: Secondary | ICD-10-CM

## 2022-02-12 DIAGNOSIS — T424X2A Poisoning by benzodiazepines, intentional self-harm, initial encounter: Secondary | ICD-10-CM | POA: Diagnosis present

## 2022-02-12 LAB — CBC
HCT: 42.5 % (ref 36.0–46.0)
Hemoglobin: 14.5 g/dL (ref 12.0–15.0)
MCH: 30.6 pg (ref 26.0–34.0)
MCHC: 34.1 g/dL (ref 30.0–36.0)
MCV: 89.7 fL (ref 80.0–100.0)
Platelets: 214 10*3/uL (ref 150–400)
RBC: 4.74 MIL/uL (ref 3.87–5.11)
RDW: 13.5 % (ref 11.5–15.5)
WBC: 8.2 10*3/uL (ref 4.0–10.5)
nRBC: 0 % (ref 0.0–0.2)

## 2022-02-12 LAB — COMPREHENSIVE METABOLIC PANEL
ALT: 24 U/L (ref 0–44)
AST: 29 U/L (ref 15–41)
Albumin: 3.7 g/dL (ref 3.5–5.0)
Alkaline Phosphatase: 55 U/L (ref 38–126)
Anion gap: 10 (ref 5–15)
BUN: 9 mg/dL (ref 6–20)
CO2: 25 mmol/L (ref 22–32)
Calcium: 8.8 mg/dL — ABNORMAL LOW (ref 8.9–10.3)
Chloride: 103 mmol/L (ref 98–111)
Creatinine, Ser: 0.67 mg/dL (ref 0.44–1.00)
GFR, Estimated: >60 mL/min (ref >60)
Glucose, Bld: 105 mg/dL — ABNORMAL HIGH (ref 70–99)
Potassium: 4.4 mmol/L (ref 3.5–5.1)
Sodium: 138 mmol/L (ref 135–145)
Total Bilirubin: 1.1 mg/dL (ref 0.3–1.2)
Total Protein: 7.1 g/dL (ref 6.5–8.1)

## 2022-02-12 LAB — BLOOD GAS, VENOUS
Acid-Base Excess: 1 mmol/L (ref 0.0–2.0)
Bicarbonate: 27.1 mmol/L (ref 20.0–28.0)
O2 Saturation: 38.6 %
Patient temperature: 37
pCO2, Ven: 48 mmHg (ref 44–60)
pH, Ven: 7.36 (ref 7.25–7.43)
pO2, Ven: <31 mmHg — CL (ref 32–45)

## 2022-02-12 LAB — RESP PANEL BY RT-PCR (RSV, FLU A&B, COVID)  RVPGX2
Influenza A by PCR: NEGATIVE
Influenza B by PCR: NEGATIVE
Resp Syncytial Virus by PCR: NEGATIVE
SARS Coronavirus 2 by RT PCR: NEGATIVE

## 2022-02-12 LAB — CBG MONITORING, ED: Glucose-Capillary: 92 mg/dL (ref 70–99)

## 2022-02-12 LAB — ACETAMINOPHEN LEVEL: Acetaminophen (Tylenol), Serum: <10 ug/mL — ABNORMAL LOW (ref 10–30)

## 2022-02-12 LAB — SALICYLATE LEVEL: Salicylate Lvl: <7 mg/dL — ABNORMAL LOW (ref 7.0–30.0)

## 2022-02-12 LAB — ETHANOL: Alcohol, Ethyl (B): <10 mg/dL (ref <10)

## 2022-02-12 MED ORDER — ROSUVASTATIN CALCIUM 5 MG PO TABS
5.0000 mg | ORAL_TABLET | Freq: Every day | ORAL | Status: DC
  Administered 2022-02-13: 5 mg via ORAL
  Filled 2022-02-12: qty 1

## 2022-02-12 MED ORDER — PREGABALIN 50 MG PO CAPS: 150.0000 mg | ORAL_CAPSULE | Freq: Two times a day (BID) | ORAL | Status: DC

## 2022-02-12 MED ORDER — SERTRALINE HCL 50 MG PO TABS
150.0000 mg | ORAL_TABLET | Freq: Every day | ORAL | Status: DC
  Filled 2022-02-12: qty 3

## 2022-02-12 MED ORDER — SERTRALINE HCL 50 MG PO TABS: 150.0000 mg | ORAL_TABLET | Freq: Every day | ORAL | Status: DC

## 2022-02-12 MED ORDER — PREGABALIN 50 MG PO CAPS
150.0000 mg | ORAL_CAPSULE | Freq: Two times a day (BID) | ORAL | Status: DC
  Administered 2022-02-12 – 2022-02-13 (×2): 150 mg via ORAL
  Filled 2022-02-12 (×2): qty 3

## 2022-02-12 MED ORDER — METFORMIN HCL 500 MG PO TABS
500.0000 mg | ORAL_TABLET | Freq: Two times a day (BID) | ORAL | Status: DC
  Administered 2022-02-12 – 2022-02-13 (×2): 500 mg via ORAL
  Filled 2022-02-12 (×2): qty 1

## 2022-02-12 MED ORDER — ALBUTEROL SULFATE HFA 108 (90 BASE) MCG/ACT IN AERS: 2.0000 | INHALATION_SPRAY | Freq: Four times a day (QID) | RESPIRATORY_TRACT | Status: DC | PRN

## 2022-02-12 MED ORDER — MONTELUKAST SODIUM 10 MG PO TABS
10.0000 mg | ORAL_TABLET | Freq: Every day | ORAL | Status: DC
  Administered 2022-02-12: 10 mg via ORAL
  Filled 2022-02-12: qty 1

## 2022-02-12 NOTE — Consult Note (Cosign Needed Addendum)
[place] ED ASSESSMENT   Reason for Consult:  Psychiatry evaluation Referring Physician:  ER Physician Patient Identification: Margaret Blackburn MRN:  [id number] ED Chief Complaint: [facility] attempt [facility]  Diagnosis:  Principal Problem:   Suicide attempt [facility]) Active Problems:   GAD (generalized anxiety disorder)   Major depressive disorder, recurrent severe without psychotic features [facility])   ED Assessment Time Calculation: Start Time: 1820 Stop Time: 1843 Total Time in Minutes (Assessment Completion): 23   Subjective:   Margaret Blackburn is a 57 y.o. female patient admitted with psychiatric hx of Generalized anxiety disorder and Depression was brought in by Ambulance from her home after OD on 16 0.5 mg Xanax pills this morning..  She texted her family members after her OD.Marland Kitchen  HPI:  Patient was tearful during our interaction and then off and on sleepy needing provider and her mother waking her up.  Patient allowed her mother to stay in the room during our interaction.  Patient tearfully admitted taking the pills and her intention was to go to sleep and not wake up.  Patient rated depression and anxiety 10/10 and her stressors are her adult children and their spouses living in her house and do not respect or care about her.  Patient reports this is her first suicide attempt and denies self harm behavior.  Patient was prescribed Xanax for sleep and anxiety by her PCP.  Patient is also taking Zoloft she says.  She does not have outpatient Mental health provider in the community.  Patient is unemployed, divorced and all of her adult children moved in with son in Sports coach.  Patient states she is not happy and lives in sadness daily.  She remains suicidal with plan to kill self by any means possible. Caucasian female, 57 years old appears older than stated age is seen drowsy, tearful after OD on Xanax.  Patient is unkempt and disheveled.  Patient reports poor appetite but fairly ok sleep when she takes her Xanax.   Patient admits smoking Cannabis daily for Chronic pain relief.  She denies other illicit drug use.  Patient smokes a pack an half of Cigarette daily.  We will prescribe Nicotine patch and will resume home Medications when she is fully awake.  We will seek inpatient Psychiatry hospitalization and will fax out records to facilities with available bed.  Patient denies HI/AH and no mention of paranoia.  Past Psychiatric History: hx of Generalized anxiety disorder and Depression..  No outpatient Psychiatrist and no previous inpatient Psych Hospitalization.  This is her first Suicide attempt.  Risk to Self or Others: Is the patient at risk to self? Yes Has the patient been a risk to self in the past 6 months? Yes Has the patient been a risk to self within the distant past? Yes Is the patient a risk to others? No Has the patient been a risk to others in the past 6 months? No Has the patient been a risk to others within the distant past? No  [place] Scale:  [place] ED from 02/12/2022 in [place] DEPT ED from 06/26/2021 in [place] CATEGORY High Risk No Risk       AIMS:  , , ,  ,   ASAM:    Substance Abuse:     Past Medical History:  Past Medical History:  Diagnosis Date   Allergy    Arthritis    Breast cancer ([place]) 12/2017   right breast  COPD (chronic obstructive pulmonary disease) ([place])    Depression    Personal history of radiation therapy    Prediabetes    Sleep apnea    supposed to get CPAP 03/25/19    Past Surgical History:  Procedure Laterality Date   ABDOMINAL HYSTERECTOMY     BACK SURGERY     BREAST BIOPSY Right 12/25/2017   affirm bx , x clip,TUBULAR  CARCINOMA   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 ENDOSCOPY;  Service: Gastroenterology;  Laterality: N/A;   SENTINEL NODE BIOPSY Right 03/21/2018   Procedure: RIGHT SENTINEL LYMPH  NODE BIOPSY;  Surgeon: Vickie Epley, MD;  Location: [facility] ORS;  Service: General;  Laterality: Right;   Family History:  Family History  Problem Relation Age of Onset   Breast cancer Mother 79   Lung cancer Mother    Breast cancer Maternal Grandmother    Thyroid cancer Father    Family Psychiatric  History: Denies Social History:  Social History   Substance and Sexual Activity  Alcohol Use No     Social History   Substance and Sexual Activity  Drug Use Yes   Types: Marijuana   Comment: occ    Social History   Socioeconomic History   Marital status: Single    Spouse name: Not on file   Number of children: 2   Years of education: Not on file   Highest education level: Not on file  Occupational History   Not on file  Tobacco Use   Smoking status: Every Day    Packs/day: 2.00    Years: 39.00    Total pack years: 78.00    Types: Cigarettes, E-cigarettes   Smokeless tobacco: Never   Tobacco comments:    Smokes a pack and a half daily  Vaping Use   Vaping Use: Former   Devices: used  2 weeks per pt  Substance and Sexual Activity   Alcohol use: No   Drug use: Yes    Types: Marijuana    Comment: occ   Sexual activity: Yes  Other Topics Concern   Not on file  Social History Narrative   Not on file   Social Determinants of Health   Financial Resource Strain: Not on file  Food Insecurity: Not on file  Transportation Needs: Not on file  Physical Activity: Not on file  Stress: Not on file  Social Connections: Not on file   Additional Social History:    Allergies:   Allergies  Allergen Reactions   Penicillins Rash and Other (See Comments)     DID THE REACTION INVOLVE: Swelling of the face/tongue/throat, SOB, or low BP? No Sudden or severe rash/hives, skin peeling, or the inside of the mouth or nose? Yes, blisters. Not immediate. Did it require medical treatment? Yes When did it last happen? 40s If all above answers are "NO", may proceed with cephalosporin use.     Labs:  Results for orders placed or performed during the hospital encounter of 02/12/22 (from the past 48 hour(s))  Comprehensive metabolic panel     Status: Abnormal   Collection Time: 02/12/22 12:36 PM  Result Value Ref Range   Sodium  138 135 - 145 mmol/L   Potassium 4.4 3.5 - 5.1 mmol/L    Comment: HEMOLYSIS AT THIS LEVEL MAY AFFECT RESULT   Chloride 103 98 - 111 mmol/L   CO2 25 22 - 32 mmol/L   Glucose, Bld 105 (H) 70 - 99 mg/dL    Comment: Glucose reference range applies only to samples taken after fasting for at least 8 hours.   BUN 9 6 - 20 mg/dL   Creatinine, Ser 0.67 0.44 - 1.00 mg/dL   Calcium 8.8 (L) 8.9 - 10.3 mg/dL   Total Protein 7.1 6.5 - 8.1 g/dL   Albumin 3.7 3.5 - 5.0 g/dL   AST 29 15 - 41 U/L    Comment: HEMOLYSIS AT THIS LEVEL MAY AFFECT RESULT   ALT 24 0 - 44 U/L    Comment: HEMOLYSIS AT THIS LEVEL MAY AFFECT RESULT   Alkaline Phosphatase 55 38 - 126 U/L   Total Bilirubin 1.1 0.3 - 1.2 mg/dL    Comment: HEMOLYSIS AT THIS LEVEL MAY AFFECT RESULT   GFR, Estimated >60 >60 mL/min    Comment: (NOTE) Calculated using the CKD-EPI Creatinine Equation (2021)    Anion gap 10 5 - 15    Comment: Performed at [facility], [place] [address], [place], [place] [address]  Ethanol     Status: None   Collection Time: 02/12/22 12:36 PM  Result Value Ref Range   Alcohol, Ethyl (B) <10 <10 mg/dL    Comment: (NOTE) Lowest detectable limit for serum alcohol is 10 mg/dL.  For medical purposes only. Performed at [facility], [place] [address] [place], [place] [address]   Salicylate level     Status: Abnormal    Collection Time: 02/12/22 12:36 PM  Result Value Ref Range   Salicylate Lvl <8.0 (L) 7.0 - 30.0 mg/dL    Comment: Performed at [facility], [place] [address] [place], [place] [address]  Acetaminophen level     Status: Abnormal   Collection Time: 02/12/22 12:36 PM  Result Value Ref Range   Acetaminophen (Tylenol), Serum <10 (L) 10 - 30 ug/mL    Comment: (NOTE) Therapeutic concentrations vary significantly. A range of 10-30 ug/mL  may be an effective concentration for many patients. However, some  are best treated at concentrations outside of this range. Acetaminophen concentrations >150 ug/mL at 4 hours after ingestion  and >50 ug/mL at 12 hours after ingestion are often associated with  toxic reactions.  Performed at [facility], [place] [address] [place], [place] [address]   cbc     Status: None   Collection Time: 02/12/22 12:36 PM  Result Value Ref Range   WBC 8.2 4.0 - 10.5 K/uL   RBC 4.74 3.87 - 5.11 MIL/uL   Hemoglobin 14.5 12.0 - 15.0 g/dL   HCT 42.5 36.0 - 46.0 %   MCV 89.7 80.0 - 100.0 fL   MCH 30.6 26.0 - 34.0 pg   MCHC 34.1 30.0 - 36.0 g/dL   RDW 13.5 11.5 - 15.5 %   Platelets 214 150 - 400 K/uL   nRBC 0.0 0.0 - 0.2 %    Comment: Performed at [facility], [place] [address], [place],  [address]  Resp panel by RT-PCR (RSV, Flu A&B, Covid) Anterior Nasal Swab     Status: None   Collection Time: 02/12/22 12:36 PM   Specimen: Anterior Nasal Swab  Result Value Ref Range   SARS Coronavirus 2 by  RT PCR NEGATIVE NEGATIVE    Comment: (NOTE) SARS-CoV-2 target nucleic acids are NOT DETECTED.  The SARS-CoV-2 RNA is generally detectable in upper respiratory specimens during the acute phase of infection. The lowest concentration of SARS-CoV-2 viral copies this assay can detect is 138 copies/mL. A negative result does not preclude SARS-Cov-2 infection and should not be used as the sole basis for treatment or other  patient management decisions. A negative result may occur with  improper specimen collection/handling, submission of specimen other than nasopharyngeal swab, presence of viral mutation(s) within the areas targeted by this assay, and inadequate number of viral copies(<138 copies/mL). A negative result must be combined with clinical observations, patient history, and epidemiological information. The expected result is Negative.  Fact Sheet for Patients:  EntrepreneurPulse.com.au  Fact Sheet for Healthcare Providers:  IncredibleEmployment.be  This test is no t yet approved or cleared by the [place] FDA and  has been authorized for detection and/or diagnosis of SARS-CoV-2 by FDA under an Emergency Use Authorization (EUA). This EUA will remain  in effect (meaning this test can be used) for the duration of the COVID-19 declaration under Section 564(b)(1) of the Act, 21 U.S.C.section 360bbb-3(b)(1), unless the authorization is terminated  or revoked sooner.       Influenza A by PCR NEGATIVE NEGATIVE   Influenza B by PCR NEGATIVE NEGATIVE    Comment: (NOTE) The Xpert Xpress SARS-CoV-2/FLU/RSV plus assay is intended as an aid in the diagnosis of influenza from Nasopharyngeal swab specimens and should not be used as a sole basis for treatment. Nasal washings and aspirates are unacceptable for Xpert Xpress SARS-CoV-2/FLU/RSV testing.  Fact Sheet for Patients: EntrepreneurPulse.com.au  Fact Sheet for Healthcare Providers: IncredibleEmployment.be  This test is not yet approved or cleared by the [place] FDA and has been authorized for detection and/or diagnosis of SARS-CoV-2 by FDA under an Emergency Use Authorization (EUA). This EUA will remain in effect (meaning this test can be used) for the duration of the COVID-19 declaration under Section 564(b)(1) of the Act, 21 U.S.C. section 360bbb-3(b)(1), unless  the authorization is terminated or revoked.     Resp Syncytial Virus by PCR NEGATIVE NEGATIVE    Comment: (NOTE) Fact Sheet for Patients: EntrepreneurPulse.com.au  Fact Sheet for Healthcare Providers: IncredibleEmployment.be  This test is not yet approved or cleared by the [place] FDA and has been authorized for detection and/or diagnosis of SARS-CoV-2 by FDA under an Emergency Use Authorization (EUA). This EUA will remain in effect (meaning this test can be used) for the duration of the COVID-19 declaration under Section 564(b)(1) of the Act, 21 U.S.C. section 360bbb-3(b)(1), unless the authorization is terminated or revoked.  Performed at [facility], [place] [address] [place], [place] [address]   Blood gas, venous (at [facility] and [facility])     Status: Abnormal   Collection Time: 02/12/22 12:43 PM  Result Value Ref Range   pH, Ven 7.36 7.25 - 7.43   pCO2, Ven 48 44 - 60 mmHg   pO2, Ven <31 (LL) 32 - 45 mmHg    Comment: CRITICAL RESULT CALLED TO, READ BACK BY AND VERIFIED WITH: BLAIR,I. RN AT 1309 02/12/22 MULLINS,T    Bicarbonate 27.1 20.0 - 28.0 mmol/L   Acid-Base Excess 1.0 0.0 - 2.0 mmol/L   O2 Saturation 38.6 %   Patient temperature 37.0     Comment: Performed at [facility], [place] [address] [place], [place] [address]  CBG monitoring, ED  Status: None   Collection Time: 02/12/22  5:11 PM  Result Value Ref Range   Glucose-Capillary 92 70 - 99 mg/dL    Comment: Glucose reference range applies only to samples taken after fasting for at least 8 hours.    Current Facility-Administered Medications  Medication Dose Route Frequency Provider Last Rate Last Admin   albuterol (VENTOLIN HFA) 108 (90 Base) MCG/ACT inhaler 2 puff  2 puff Inhalation Q6H PRN Valarie Merino, MD       metFORMIN (GLUCOPHAGE) tablet 500 mg  500 mg Oral BID WC Valarie Merino, MD   500 mg at 02/12/22 1712   montelukast  (SINGULAIR) tablet 10 mg  10 mg Oral QHS Valarie Merino, MD       pregabalin (LYRICA) capsule 150 mg  150 mg Oral BID Valarie Merino, MD       [START ON 02/13/2022] rosuvastatin (CRESTOR) tablet 5 mg  5 mg Oral Daily Valarie Merino, MD       [START ON 02/13/2022] sertraline (ZOLOFT) tablet 150 mg  150 mg Oral Daily Valarie Merino, MD       Current Outpatient Medications  Medication Sig Dispense Refill   albuterol (VENTOLIN HFA) 108 (90 Base) MCG/ACT inhaler Inhale 2 puffs into the lungs every 6 (six) hours as needed for wheezing or shortness of breath. 18 g 5   ALPRAZolam (XANAX) 0.5 MG tablet Take 1 tablet (0.5 mg total) by mouth 2 (two) times daily as needed for anxiety or sleep. May take 1 extra tablet for severe anxiety/panic daily prn 70 tablet 2   anastrozole (ARIMIDEX) 1 MG tablet TAKE 1 TABLET BY MOUTH ONCE A DAY 30 tablet 0   azelastine (ASTELIN) 0.1 % nasal spray Place 1 spray into both nostrils 2 (two) times daily. Use in each nostril as directed 30 mL 12   busPIRone (BUSPAR) 30 MG tablet Take 1 tablet (30 mg total) by mouth 2 (two) times daily. 180 tablet 1   Calcium Carb-Cholecalciferol (CALCIUM 600+D3 PO) Take 1 tablet by mouth in the morning and at bedtime.     cyclobenzaprine (FLEXERIL) 10 MG tablet Take 1 tablet (10 mg total) by mouth 3 (three) times daily as needed for muscle spasms. 60 tablet 0   diclofenac (VOLTAREN) 75 MG EC tablet Take 1 tablet (75 mg total) by mouth 2 (two) times daily. 60 tablet 1   Fluticasone-Umeclidin-Vilant (TRELEGY ELLIPTA) 100-62.5-25 MCG/ACT AEPB Inhale 1 puff into the lungs daily. 1 each 11   ipratropium-albuterol (DUONEB) 0.5-2.5 (3) MG/3ML SOLN Take 3 mLs by nebulization every 6 (six) hours as needed. 360 mL 3   levocetirizine (XYZAL) 5 MG tablet Take 1 tablet (5 mg total) by mouth every evening. 90 tablet 1   metFORMIN (GLUCOPHAGE) 500 MG tablet Take 1 tablet (500 mg total) by mouth 2 (two) times daily with a meal. 180 tablet 3   montelukast  (SINGULAIR) 10 MG tablet Take 1 tablet (10 mg total) by mouth at bedtime. 90 tablet 3   Multiple Vitamins-Minerals (ALIVE WOMENS ENERGY PO) Take 1 Dose by mouth daily.     pregabalin (LYRICA) 150 MG capsule Take 1 capsule (150 mg total) by mouth 2 (two) times daily. 60 capsule 2   rosuvastatin (CRESTOR) 5 MG tablet Take 1 tablet (5 mg total) by mouth daily. 90 tablet 3   sertraline (ZOLOFT) 100 MG tablet Take 1.5 tablets (150 mg total) by mouth daily. 135 tablet 1   traZODone (DESYREL) 50 MG tablet  Take 1-2 tablets (50-100 mg total) by mouth at bedtime as needed for sleep. 180 tablet 3    Musculoskeletal: Strength & Muscle Tone:  sitting in bed during evaluation Gait & Station:  sitting in bed during evaluation Patient leans:  see above   Psychiatric Specialty Exam: Presentation  General Appearance:  Casual; Disheveled  Eye Contact: Fleeting  Speech: Clear and Coherent; Normal Rate  Speech Volume: Normal  Handedness: Right   Mood and Affect  Mood: Hopeless; Anxious; Depressed  Affect: Congruent; Depressed; Tearful   Thought Process  Thought Processes: Coherent; Goal Directed; Linear  Descriptions of Associations:Intact  Orientation:Full (Time, Place and Person)  Thought Content:Logical  History of Schizophrenia/Schizoaffective disorder:No data recorded Duration of Psychotic Symptoms:No data recorded Hallucinations:Hallucinations: Visual Description of Visual Hallucinations: sees cat attacking her.  Ideas of Reference:None  Suicidal Thoughts:Suicidal Thoughts: Yes, Active SI Active Intent and/or Plan: With Intent; Without Plan (any means is ok for her she says to kill herself.)  Homicidal Thoughts:No data recorded  Sensorium  Memory: Immediate Good; Recent Good; Remote Good  Judgment: Intact  Insight: Present   Executive Functions  Concentration: Fair  Attention Span: Fair  Recall: [employer] of  Knowledge: Fair  Language: Fair   Psychomotor Activity  Psychomotor Activity: Psychomotor Activity: Normal   Assets  Assets: Armed forces logistics/support/administrative officer; Housing; Social Support    Sleep  Sleep: Sleep: Fair   Physical Exam: Physical Exam Vitals and nursing note reviewed.  Constitutional:      Appearance: She is obese.  HENT:     Head: Normocephalic.     Nose: Nose normal.  Cardiovascular:     Rate and Rhythm: Tachycardia present.  Pulmonary:     Effort: Pulmonary effort is normal.  Musculoskeletal:     Cervical back: Normal range of motion.     Comments: Reports chronic back, hand and hip pain.  Skin:    General: Skin is warm and dry.  Neurological:     Mental Status: She is alert and oriented to person, place, and time.    Review of Systems  Constitutional: Negative.   HENT: Negative.    Eyes: Negative.   Respiratory: Negative.    Cardiovascular: Negative.   Gastrointestinal: Negative.   Genitourinary: Negative.   Musculoskeletal:  Positive for back pain and joint pain.       Hand and hip pain.  Skin: Negative.   Neurological: Negative.   Endo/Heme/Allergies: Negative.   Psychiatric/Behavioral:  Positive for depression, hallucinations, substance abuse and suicidal ideas. The patient is nervous/anxious.    Blood pressure 127/67, pulse (!) 109, temperature 97.6 F (36.4 C), temperature source Oral, resp. rate 20, height '5\' 2"'$  (1.575 m), weight 87.5 kg, SpO2 97 %. Body mass index is 35.3 kg/m.  Medical Decision Making: Patient is sad, depressed and angry.  She feels hopeless and helpless.  She remains suicidal and is a danger to self.  We will admit and seek bed placement at any facility with available bed.  We will resume home Medications when she is fully awake.  Problem 1: Suicide attempt  Problem 2: Generalized anxiety disorder  Problem 3: Recurrent Major Depressive disorder, severe without Psychotic features.  Disposition:  Admit, seek bed  placement.  Delfin Gant, NP-PMHNP-BC 02/12/2022 6:53 PM

## 2022-02-12 NOTE — Progress Notes (Signed)
Patient has been denied by [facility] due to no appropriate beds available. Patient meets [place] inpatient criteria per Charmaine Downs, NP. Patient has been faxed out to the following facilities:   [facility]  [address] [place] [address] [phone] [place]  [place], [place] [address] [phone]  [address] [place] [address] [phone]  [facility]  [address] [place] [address] [phone] [place]  [address] [place] [address] [phone]  [facility]  [address], [place] [address] [phone] [place]  [address] [place] [address] [phone]  [facility]  [address], [place] [address] [phone] [facility]  [place], [place] [address] [phone]  [facility]  [address] [place], [place] [address] [phone]  [place] [address] [place] [address] [phone] [facility]  [address], [place] [address] [phone]  [facility]  [address] [place] [address] [place]  [facility]  [address] [place] [address] [phone] [place], MSW, LCSW-A  11:55 PM 02/12/2022

## 2022-02-12 NOTE — ED Provider Notes (Signed)
[facility] DEPT Provider Note   CSN: [id number] Arrival date & time: 02/12/22  1214     History  Chief Complaint  Patient presents with   Drug Overdose    Margaret Blackburn is a 57 y.o. female.  57 year old female with prior medical history as detailed below presents for evaluation.  Patient reports that she took a handful of Xanax this morning and an overdose attempt.  She reports that she did not want to live anymore.  Patient reports taking approximately 8 mg total of Xanax.  Overdose attempt occurred around 8:30 AM.  She is sleeping on evaluation.  She is otherwise without complaint.  She does endorse suicidal ideation.  She denies coingestion.  She denies alcohol use.  She denies prior overdose or suicide attempt.  The history is provided by the patient and medical records.       Home Medications Prior to Admission medications   Medication Sig Start Date End Date Taking? Authorizing Provider  albuterol (VENTOLIN HFA) 108 (90 Base) MCG/ACT inhaler Inhale 2 puffs into the lungs every 6 (six) hours as needed for wheezing or shortness of breath. 12/04/21   Jonetta Osgood, NP  ALPRAZolam (XANAX) 0.5 MG tablet Take 1 tablet (0.5 mg total) by mouth 2 (two) times daily as needed for anxiety or sleep. May take 1 extra tablet for severe anxiety/panic daily prn 01/18/22   Jonetta Osgood, NP  anastrozole (ARIMIDEX) 1 MG tablet TAKE 1 TABLET BY MOUTH ONCE A DAY 12/23/20   Sindy Guadeloupe, MD  azelastine (ASTELIN) 0.1 % nasal spray Place 1 spray into both nostrils 2 (two) times daily. Use in each nostril as directed 04/14/21   Jonetta Osgood, NP  busPIRone (BUSPAR) 30 MG tablet Take 1 tablet (30 mg total) by mouth 2 (two) times daily. 10/16/21   Jonetta Osgood, NP  Calcium Carb-Cholecalciferol (CALCIUM 600+D3 PO) Take 1 tablet by mouth in the morning and at bedtime.    [provider]  cyclobenzaprine (FLEXERIL) 10 MG tablet Take 1 tablet (10  mg total) by mouth 3 (three) times daily as needed for muscle spasms. 01/18/22   Jonetta Osgood, NP  diclofenac (VOLTAREN) 75 MG EC tablet Take 1 tablet (75 mg total) by mouth 2 (two) times daily. 01/18/22   Jonetta Osgood, NP  Fluticasone-Umeclidin-Vilant (TRELEGY ELLIPTA) 100-62.5-25 MCG/ACT AEPB Inhale 1 puff into the lungs daily. 04/16/21   Jonetta Osgood, NP  ipratropium-albuterol (DUONEB) 0.5-2.5 (3) MG/3ML SOLN Take 3 mLs by nebulization every 6 (six) hours as needed. 10/16/21   Jonetta Osgood, NP  levocetirizine (XYZAL) 5 MG tablet Take 1 tablet (5 mg total) by mouth every evening. 12/04/21   Jonetta Osgood, NP  metFORMIN (GLUCOPHAGE) 500 MG tablet Take 1 tablet (500 mg total) by mouth 2 (two) times daily with a meal. 07/19/21   Abernathy, Alyssa, NP  montelukast (SINGULAIR) 10 MG tablet Take 1 tablet (10 mg total) by mouth at bedtime. 10/16/21   Jonetta Osgood, NP  Multiple Vitamins-Minerals (ALIVE WOMENS ENERGY PO) Take 1 Dose by mouth daily.    [provider]  pregabalin (LYRICA) 150 MG capsule Take 1 capsule (150 mg total) by mouth 2 (two) times daily. 10/16/21   Jonetta Osgood, NP  rosuvastatin (CRESTOR) 5 MG tablet Take 1 tablet (5 mg total) by mouth daily. 07/19/21   Jonetta Osgood, NP  sertraline (ZOLOFT) 100 MG tablet Take 1.5 tablets (150 mg total) by mouth daily. 10/16/21   Jonetta Osgood, NP  traZODone (DESYREL) 50  MG tablet Take 1-2 tablets (50-100 mg total) by mouth at bedtime as needed for sleep. 10/16/21   Jonetta Osgood, NP      Allergies    Penicillins    Review of Systems   Review of Systems  All other systems reviewed and are negative.   Physical Exam Updated Vital Signs BP 109/74 (BP Location: Left Arm)   Pulse (!) 105   Temp 98.4 F (36.9 C) (Oral)   Resp (!) 34   Ht '5\' 2"'$  (1.575 m)   Wt 87.5 kg   SpO2 97%   BMI 35.30 kg/m  Physical Exam Vitals and nursing note reviewed.  Constitutional:      General: She is not in  acute distress.    Appearance: Normal appearance. She is well-developed.     Comments: Alert but sleepy.  Arouses to verbal stimulation and answers questions appropriately.  HENT:     Head: Normocephalic and atraumatic.  Eyes:     Conjunctiva/sclera: Conjunctivae normal.     Pupils: Pupils are equal, round, and reactive to light.  Cardiovascular:     Rate and Rhythm: Normal rate and regular rhythm.     Heart sounds: Normal heart sounds.  Pulmonary:     Effort: Pulmonary effort is normal. No respiratory distress.     Breath sounds: Normal breath sounds.  Abdominal:     General: There is no distension.     Palpations: Abdomen is soft.     Tenderness: There is no abdominal tenderness.  Musculoskeletal:        General: No deformity. Normal range of motion.     Cervical back: Normal range of motion and neck supple.  Skin:    General: Skin is warm and dry.  Neurological:     General: No focal deficit present.     Mental Status: She is alert and oriented to person, place, and time.     ED Results / Procedures / Treatments   Labs (all labs ordered are listed, but only abnormal results are displayed) Labs Reviewed  COMPREHENSIVE METABOLIC PANEL - Abnormal; Notable for the following components:      Result Value   Glucose, Bld 105 (*)    Calcium 8.8 (*)    All other components within normal limits  SALICYLATE LEVEL - Abnormal; Notable for the following components:   Salicylate Lvl <6.2 (*)    All other components within normal limits  ACETAMINOPHEN LEVEL - Abnormal; Notable for the following components:   Acetaminophen (Tylenol), Serum <10 (*)    All other components within normal limits  BLOOD GAS, VENOUS - Abnormal; Notable for the following components:   pO2, Ven <31 (*)    All other components within normal limits  RESP PANEL BY RT-PCR (RSV, FLU A&B, COVID)  RVPGX2  ETHANOL  CBC  RAPID URINE DRUG SCREEN, HOSP PERFORMED  BLOOD GAS, VENOUS    EKG EKG  Interpretation  Date/Time:  Monday February 12 2022 12:37:39 EST Ventricular Rate:  98 PR Interval:  180 QRS Duration: 92 QT Interval:  336 QTC Calculation: 429 R Axis:   58 Text Interpretation: Sinus rhythm Borderline ST elevation, lateral leads Confirmed by Dene Gentry [phone]) on 02/12/2022 12:46:04 PM  Radiology No results found.  Procedures Procedures    Medications Ordered in ED Medications - No data to display  ED Course/ Medical Decision Making/ A&P  Medical Decision Making Amount and/or Complexity of Data Reviewed Labs: ordered.  Risk Prescription drug management.    Medical Screen Complete  This patient presented to the ED with complaint of overdose.  This complaint involves an extensive number of treatment options. The initial differential diagnosis includes, but is not limited to, overdose, metabolic abnormality, etc.  This presentation is: Acute, Chronic, Self-Limited, Previously Undiagnosed, Uncertain Prognosis, Complicated, Systemic Symptoms, and Threat to Life/Bodily Function  Patient reports overdose of Xanax sometime this morning around 8 AM.  Patient is sleepy on arrival.  Patient endorsed suicidal ideation.  She is otherwise calm and cooperative.  Patient is agreeable with plan for psychiatric evaluation after medical clearance.  Patient is now medically clear for further psychiatric evaluation  IVC not initiated its time given patient's cooperation with plan for psychiatric evaluation.  Patient and patient's mother at bedside understand plan of care.  Final disposition dependent upon psychiatric assessment and plan.  Additional history obtained:  External records from outside sources obtained and reviewed including prior ED visits and prior Inpatient records.    Lab Tests:  I ordered and personally interpreted labs.  The pertinent results include: CBC, CMP, acetaminophen, salicylate, VBG, COVID,  ethanol  Cardiac Monitoring:  The patient was maintained on a cardiac monitor.  I personally viewed and interpreted the cardiac monitor which showed an underlying rhythm of: NSR Problem List / ED Course:  Overdose, suicidal ideation   Reevaluation:  After the interventions noted above, I reevaluated the patient and found that they have: improved  Disposition:  After consideration of the diagnostic results and the patients response to treatment, I feel that the patent would benefit from psychiatric evaluation and treatment.     CRITICAL CARE Performed by: Valarie Merino   Total critical care time: 30 minutes  Critical care time was exclusive of separately billable procedures and treating other patients.  Critical care was necessary to treat or prevent imminent or life-threatening deterioration.  Critical care was time spent personally by me on the following activities: development of treatment plan with patient and/or surrogate as well as nursing, discussions with consultants, evaluation of patient's response to treatment, examination of patient, obtaining history from patient or surrogate, ordering and performing treatments and interventions, ordering and review of laboratory studies, ordering and review of radiographic studies, pulse oximetry and re-evaluation of patient's condition.        Final Clinical Impression(s) / ED Diagnoses Final diagnoses:  Intentional overdose, initial encounter [facility])    Rx / DC Orders ED Discharge Orders     None         Valarie Merino, MD 02/12/22 1436

## 2022-02-12 NOTE — ED Triage Notes (Signed)
Pt BIBA from home for overdose, took approx 16 0.'5mg'$  xanax around 0800-0830 this morning. Texted family. A&Ox4, sleepy. Denies etoh and other drugs. Hx COPD and asthma. Denies previous attempts or hospitalizations. States this was not planned. 20ga LAC.  106/74 HR 110 96% RA CBG 127

## 2022-02-12 NOTE — ED Notes (Addendum)
Poison Control contacted at this time. Advised to watch for CNS/Respiratory depression. Stated the symptoms should be wearing off by now. Spoke to [place].

## 2022-02-12 NOTE — ED Notes (Signed)
Patient to room 27. Patient oriented to unit and room . Sitter at bedside.

## 2022-02-13 ENCOUNTER — Encounter (HOSPITAL_COMMUNITY): Payer: Self-pay | Admitting: Nurse Practitioner

## 2022-02-13 ENCOUNTER — Inpatient Hospital Stay (HOSPITAL_COMMUNITY)
Admission: AD | Admit: 2022-02-13 | Discharge: 2022-02-18 | DRG: 885 | Disposition: A | Discharge status: Home / Self Care | Payer: Medicaid Other | Source: Other Acute Inpatient Hospital | Attending: Psychiatry | Admitting: Psychiatry

## 2022-02-13 ENCOUNTER — Other Ambulatory Visit: Payer: Self-pay

## 2022-02-13 DIAGNOSIS — G4700 Insomnia, unspecified: Secondary | ICD-10-CM | POA: Diagnosis present

## 2022-02-13 DIAGNOSIS — Z803 Family history of malignant neoplasm of breast: Secondary | ICD-10-CM

## 2022-02-13 DIAGNOSIS — E785 Hyperlipidemia, unspecified: Secondary | ICD-10-CM | POA: Diagnosis present

## 2022-02-13 DIAGNOSIS — Z7984 Long term (current) use of oral hypoglycemic drugs: Secondary | ICD-10-CM

## 2022-02-13 DIAGNOSIS — Z853 Personal history of malignant neoplasm of breast: Secondary | ICD-10-CM

## 2022-02-13 DIAGNOSIS — F411 Generalized anxiety disorder: Secondary | ICD-10-CM | POA: Diagnosis present

## 2022-02-13 DIAGNOSIS — F332 Major depressive disorder, recurrent severe without psychotic features: Principal | ICD-10-CM | POA: Diagnosis present

## 2022-02-13 DIAGNOSIS — G4730 Sleep apnea, unspecified: Secondary | ICD-10-CM | POA: Diagnosis present

## 2022-02-13 DIAGNOSIS — E119 Type 2 diabetes mellitus without complications: Secondary | ICD-10-CM | POA: Diagnosis present

## 2022-02-13 DIAGNOSIS — J4489 Other specified chronic obstructive pulmonary disease: Secondary | ICD-10-CM | POA: Diagnosis present

## 2022-02-13 DIAGNOSIS — G40909 Epilepsy, unspecified, not intractable, without status epilepticus: Secondary | ICD-10-CM | POA: Diagnosis present

## 2022-02-13 DIAGNOSIS — F17210 Nicotine dependence, cigarettes, uncomplicated: Secondary | ICD-10-CM | POA: Diagnosis present

## 2022-02-13 DIAGNOSIS — T424X2A Poisoning by benzodiazepines, intentional self-harm, initial encounter: Secondary | ICD-10-CM | POA: Diagnosis present

## 2022-02-13 DIAGNOSIS — Z90710 Acquired absence of both cervix and uterus: Secondary | ICD-10-CM | POA: Diagnosis not present

## 2022-02-13 DIAGNOSIS — Z79899 Other long term (current) drug therapy: Secondary | ICD-10-CM

## 2022-02-13 HISTORY — DX: Anxiety disorder, unspecified: F41.9

## 2022-02-13 LAB — RAPID URINE DRUG SCREEN, HOSP PERFORMED
Amphetamines: NOT DETECTED
Barbiturates: NOT DETECTED
Benzodiazepines: POSITIVE — AB
Cocaine: NOT DETECTED
Opiates: NOT DETECTED
Tetrahydrocannabinol: POSITIVE — AB

## 2022-02-13 MED ORDER — ACETAMINOPHEN 325 MG PO TABS
650.0000 mg | ORAL_TABLET | Freq: Four times a day (QID) | ORAL | Status: DC | PRN
  Administered 2022-02-16 – 2022-02-17 (×3): 650 mg via ORAL
  Filled 2022-02-13 (×3): qty 2

## 2022-02-13 MED ORDER — METFORMIN HCL 500 MG PO TABS
500.0000 mg | ORAL_TABLET | Freq: Two times a day (BID) | ORAL | Status: DC
  Administered 2022-02-13 – 2022-02-18 (×10): 500 mg via ORAL
  Filled 2022-02-13 (×16): qty 1

## 2022-02-13 MED ORDER — NICOTINE 21 MG/24HR TD PT24
21.0000 mg | MEDICATED_PATCH | Freq: Once | TRANSDERMAL | Status: DC
  Filled 2022-02-13: qty 1

## 2022-02-13 MED ORDER — PREGABALIN 75 MG PO CAPS
150.0000 mg | ORAL_CAPSULE | Freq: Two times a day (BID) | ORAL | Status: DC
  Administered 2022-02-13 – 2022-02-18 (×10): 150 mg via ORAL
  Filled 2022-02-13 (×10): qty 2

## 2022-02-13 MED ORDER — MONTELUKAST SODIUM 10 MG PO TABS
10.0000 mg | ORAL_TABLET | Freq: Every day | ORAL | Status: DC
  Administered 2022-02-13 – 2022-02-17 (×5): 10 mg via ORAL
  Filled 2022-02-13 (×8): qty 1

## 2022-02-13 MED ORDER — ALUM & MAG HYDROXIDE-SIMETH 200-200-20 MG/5ML PO SUSP: 30.0000 mL | ORAL | Status: DC | PRN

## 2022-02-13 MED ORDER — SERTRALINE HCL 50 MG PO TABS
150.0000 mg | ORAL_TABLET | Freq: Every day | ORAL | Status: DC
  Administered 2022-02-14: 150 mg via ORAL
  Filled 2022-02-13 (×3): qty 1

## 2022-02-13 MED ORDER — MAGNESIUM HYDROXIDE 400 MG/5ML PO SUSP: 30.0000 mL | Freq: Every day | ORAL | Status: DC | PRN

## 2022-02-13 MED ORDER — ROSUVASTATIN CALCIUM 5 MG PO TABS
5.0000 mg | ORAL_TABLET | Freq: Every day | ORAL | Status: DC
  Administered 2022-02-14 – 2022-02-17 (×4): 5 mg via ORAL
  Filled 2022-02-13 (×7): qty 1

## 2022-02-13 MED ORDER — NICOTINE 21 MG/24HR TD PT24
21.0000 mg | MEDICATED_PATCH | Freq: Once | TRANSDERMAL | Status: DC
  Administered 2022-02-13: 21 mg via TRANSDERMAL
  Filled 2022-02-13: qty 1

## 2022-02-13 MED ORDER — ALBUTEROL SULFATE HFA 108 (90 BASE) MCG/ACT IN AERS: 2.0000 | INHALATION_SPRAY | Freq: Four times a day (QID) | RESPIRATORY_TRACT | Status: DC | PRN

## 2022-02-13 NOTE — Progress Notes (Signed)
Pt admitted voluntarily to [facility] adult unit.  This is pt's first behavioral admission.  Pt stated she was "tired of living" so she took 8 of her prescribed Xanax.  Pt stated she was aggravated with her family (that live under her roof).  She desires for them to find another place to live.  Pt has been unemployed for 3 years d/t arthritis in lower back.  Has been trying to get disability for 3 years without success.  Denies upcoming court dates.  Pt denied SI, HI and AVH at the time of admission.  Denied pain.  Pt oriented to unit.  Pt safe on unit.

## 2022-02-13 NOTE — Progress Notes (Signed)
Psychoeducational Group Note  Date:  02/13/2022 Time:  2059  Group Topic/Focus:  Wrap-Up Group:   The focus of this group is to help patients review their daily goal of treatment and discuss progress on daily workbooks.  Participation Level: Did Not Attend  Participation Quality:  Not Applicable  Affect:  Not Applicable  Cognitive:  Not Applicable  Insight:  Not Applicable  Engagement in Group: Not Applicable  Additional Comments: The patient did not attend group this evening.   Archie Balboa S 02/13/2022, 8:59 PM

## 2022-02-13 NOTE — ED Notes (Signed)
Report called to Will RN. Safe Transport called.

## 2022-02-13 NOTE — Progress Notes (Addendum)
Pt was accepted to [facility] 02/13/2022, pending voluntary consent form faxed to [phone]. Bed assignment: 855-0  Pt meets inpatient criteria per Charmaine Downs, NP  Attending Physician will be Janine Limbo, MD  Report can be called to: Adult unit: [phone]  Pt can arrive after pending items are received  Care Team Notified: [facility] Scharlene Gloss, RN, Charmaine Downs, NP, and Corinna Gab, RN  [place], [place]  02/13/2022 12:05 PM

## 2022-02-13 NOTE — ED Provider Notes (Addendum)
Emergency Medicine Observation Re-evaluation Note  Margaret Blackburn is a 57 y.o. female, seen on rounds today.  Pt initially presented to the ED for complaints of Drug Overdose Currently, the patient is asleep, resting.  Physical Exam  BP 90/70 (BP Location: Right Arm)   Pulse (!) 105   Temp (!) 97.5 F (36.4 C) (Oral)   Resp 14   Ht '5\' 2"'$  (1.575 m)   Wt 87.5 kg   SpO2 96%   BMI 35.30 kg/m  Physical Exam General: NAD Cardiac: well perfused Lungs: even and unlabored Psych: no agitation  ED Course / MDM  EKG:EKG Interpretation  Date/Time:  Monday February 12 2022 12:37:39 EST Ventricular Rate:  98 PR Interval:  180 QRS Duration: 92 QT Interval:  336 QTC Calculation: 429 R Axis:   58 Text Interpretation: Sinus rhythm Borderline ST elevation, lateral leads Confirmed by Dene Gentry [phone]) on 02/12/2022 12:46:04 PM  I have reviewed the labs performed to date as well as medications administered while in observation.  Recent changes in the last 24 hours include presented for Xanax overdose as a suicide attempt, psychiatry recommending inpatient. Medically cleared.   Plan  Current plan is for Inpatient psychiatric admission.    Regan Lemming, MD 02/13/22 [id number]   330PM Pt admitted to [facility].   Regan Lemming, MD 02/13/22 1538

## 2022-02-13 NOTE — ED Notes (Signed)
Attempted to give report on this patient x3 over the past 2 hours, advised that someone would call back.

## 2022-02-14 ENCOUNTER — Encounter (HOSPITAL_COMMUNITY): Payer: Self-pay

## 2022-02-14 MED ORDER — ARIPIPRAZOLE 2 MG PO TABS
2.0000 mg | ORAL_TABLET | Freq: Every day | ORAL | Status: DC
  Administered 2022-02-14 – 2022-02-18 (×5): 2 mg via ORAL
  Filled 2022-02-14: qty 3
  Filled 2022-02-14 (×6): qty 1

## 2022-02-14 MED ORDER — TRAZODONE HCL 50 MG PO TABS
50.0000 mg | ORAL_TABLET | Freq: Every evening | ORAL | Status: DC | PRN
  Administered 2022-02-14 – 2022-02-17 (×4): 50 mg via ORAL
  Filled 2022-02-14: qty 3
  Filled 2022-02-14 (×4): qty 1

## 2022-02-14 MED ORDER — DULOXETINE HCL 60 MG PO CPEP
60.0000 mg | ORAL_CAPSULE | Freq: Every day | ORAL | Status: DC
  Administered 2022-02-15 – 2022-02-18 (×4): 60 mg via ORAL
  Filled 2022-02-14 (×2): qty 1
  Filled 2022-02-14: qty 3
  Filled 2022-02-14 (×3): qty 1
  Filled 2022-02-14: qty 3

## 2022-02-14 MED ORDER — NICOTINE 21 MG/24HR TD PT24
21.0000 mg | MEDICATED_PATCH | Freq: Every day | TRANSDERMAL | Status: DC
  Administered 2022-02-14 – 2022-02-18 (×5): 21 mg via TRANSDERMAL
  Filled 2022-02-14 (×7): qty 1

## 2022-02-14 MED ORDER — HYDROXYZINE HCL 25 MG PO TABS
25.0000 mg | ORAL_TABLET | Freq: Three times a day (TID) | ORAL | Status: DC | PRN
  Administered 2022-02-14 – 2022-02-17 (×6): 25 mg via ORAL
  Filled 2022-02-14: qty 10
  Filled 2022-02-14 (×6): qty 1
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
## 2022-02-14 NOTE — BH IP Treatment Plan (Signed)
Interdisciplinary Treatment and Diagnostic Plan Update  02/14/2022 Time of Session: 9:30am  Margaret Blackburn MRN: [id number]  Principal Diagnosis: Major depressive disorder, recurrent severe without psychotic features ([place])  Secondary Diagnoses: Principal Problem:   Major depressive disorder, recurrent severe without psychotic features ([place])   Current Medications:  Current Facility-Administered Medications  Medication Dose Route Frequency Provider Last Rate Last Admin   acetaminophen (TYLENOL) tablet 650 mg  650 mg Oral Q6H PRN Onuoha, Josephine C, NP       albuterol (VENTOLIN HFA) 108 (90 Base) MCG/ACT inhaler 2 puff  2 puff Inhalation Q6H PRN Cleatrice Burke, Josephine C, NP       alum & mag hydroxide-simeth (MAALOX/MYLANTA) 200-200-20 MG/5ML suspension 30 mL  30 mL Oral Q4H PRN Charmaine Downs C, NP       hydrOXYzine (ATARAX) tablet 25 mg  25 mg Oral TID PRN Dian Situ, MD   25 mg at 02/14/22 1030   magnesium hydroxide (MILK OF MAGNESIA) suspension 30 mL  30 mL Oral Daily PRN Charmaine Downs C, NP       metFORMIN (GLUCOPHAGE) tablet 500 mg  500 mg Oral BID WC Onuoha, Josephine C, NP   500 mg at 02/14/22 0803   montelukast (SINGULAIR) tablet 10 mg  10 mg Oral QHS Onuoha, Josephine C, NP   10 mg at 02/13/22 2137   nicotine (NICODERM CQ - dosed in mg/24 hours) patch 21 mg  21 mg Transdermal Once Onuoha, Reginold Agent C, NP       nicotine (NICODERM CQ - dosed in mg/24 hours) patch 21 mg  21 mg Transdermal Daily Massengill, Ovid Curd, MD   21 mg at 02/14/22 0803   pregabalin (LYRICA) capsule 150 mg  150 mg Oral BID Charmaine Downs C, NP   150 mg at 02/14/22 0805   rosuvastatin (CRESTOR) tablet 5 mg  5 mg Oral Daily Charmaine Downs C, NP   5 mg at 02/14/22 0803   sertraline (ZOLOFT) tablet 150 mg  150 mg Oral Daily Charmaine Downs C, NP   150 mg at 02/14/22 0803   PTA Medications: Medications Prior to Admission  Medication Sig Dispense Refill Last Dose   albuterol (VENTOLIN HFA) 108 (90 Base)  MCG/ACT inhaler Inhale 2 puffs into the lungs every 6 (six) hours as needed for wheezing or shortness of breath. 18 g 5    ALPRAZolam (XANAX) 0.5 MG tablet Take 1 tablet (0.5 mg total) by mouth 2 (two) times daily as needed for anxiety or sleep. May take 1 extra tablet for severe anxiety/panic daily prn 70 tablet 2    azelastine (ASTELIN) 0.1 % nasal spray Place 1 spray into both nostrils 2 (two) times daily. Use in each nostril as directed 30 mL 12    Calcium Carb-Cholecalciferol (CALCIUM 600+D3 PO) Take 1 tablet by mouth in the morning and at bedtime.      cyclobenzaprine (FLEXERIL) 10 MG tablet Take 1 tablet (10 mg total) by mouth 3 (three) times daily as needed for muscle spasms. 60 tablet 0    diclofenac (VOLTAREN) 75 MG EC tablet Take 1 tablet (75 mg total) by mouth 2 (two) times daily. 60 tablet 1    Fluticasone-Umeclidin-Vilant (TRELEGY ELLIPTA) 100-62.5-25 MCG/ACT AEPB Inhale 1 puff into the lungs daily. 1 each 11    levocetirizine (XYZAL) 5 MG tablet Take 1 tablet (5 mg total) by mouth every evening. 90 tablet 1    metFORMIN (GLUCOPHAGE) 500 MG tablet Take 1 tablet (500 mg total) by mouth 2 (two) times  daily with a meal. 180 tablet 3    montelukast (SINGULAIR) 10 MG tablet Take 1 tablet (10 mg total) by mouth at bedtime. 90 tablet 3    Multiple Vitamins-Minerals (ALIVE WOMENS ENERGY PO) Take 1 Dose by mouth daily.      pregabalin (LYRICA) 150 MG capsule Take 1 capsule (150 mg total) by mouth 2 (two) times daily. 60 capsule 2    rosuvastatin (CRESTOR) 5 MG tablet Take 1 tablet (5 mg total) by mouth daily. 90 tablet 3     Patient Stressors:    Patient Strengths:    Treatment Modalities: Medication Management, Group therapy, Case management,  1 to 1 session with clinician, Psychoeducation, Recreational therapy.   Physician Treatment Plan for Primary Diagnosis: Major depressive disorder, recurrent severe without psychotic features ([place]) Long Term Goal(s): Improvement in symptoms so as  ready for discharge   Short Term Goals: Ability to identify and develop effective coping behaviors will improve Ability to maintain clinical measurements within normal limits will improve Compliance with prescribed medications will improve Ability to identify changes in lifestyle to reduce recurrence of condition will improve Ability to verbalize feelings will improve Ability to disclose and discuss suicidal ideas Ability to demonstrate self-control will improve  Medication Management: Evaluate patient's response, side effects, and tolerance of medication regimen.  Therapeutic Interventions: 1 to 1 sessions, Unit Group sessions and Medication administration.  Evaluation of Outcomes: Not Met  Physician Treatment Plan for Secondary Diagnosis: Principal Problem:   Major depressive disorder, recurrent severe without psychotic features ([place])  Long Term Goal(s): Improvement in symptoms so as ready for discharge   Short Term Goals: Ability to identify and develop effective coping behaviors will improve Ability to maintain clinical measurements within normal limits will improve Compliance with prescribed medications will improve Ability to identify changes in lifestyle to reduce recurrence of condition will improve Ability to verbalize feelings will improve Ability to disclose and discuss suicidal ideas Ability to demonstrate self-control will improve     Medication Management: Evaluate patient's response, side effects, and tolerance of medication regimen.  Therapeutic Interventions: 1 to 1 sessions, Unit Group sessions and Medication administration.  Evaluation of Outcomes: Not Met   RN Treatment Plan for Primary Diagnosis: Major depressive disorder, recurrent severe without psychotic features ([place]) Long Term Goal(s): Knowledge of disease and therapeutic regimen to maintain health will improve  Short Term Goals: Ability to remain free from injury will improve, Ability to participate  in decision making will improve, Ability to verbalize feelings will improve, Ability to disclose and discuss suicidal ideas, and Ability to identify and develop effective coping behaviors will improve  Medication Management: RN will administer medications as ordered by provider, will assess and evaluate patient's response and provide education to patient for prescribed medication. RN will report any adverse and/or side effects to prescribing provider.  Therapeutic Interventions: 1 on 1 counseling sessions, Psychoeducation, Medication administration, Evaluate responses to treatment, Monitor vital signs and CBGs as ordered, Perform/monitor CIWA, COWS, AIMS and Fall Risk screenings as ordered, Perform wound care treatments as ordered.  Evaluation of Outcomes: Not Met   LCSW Treatment Plan for Primary Diagnosis: Major depressive disorder, recurrent severe without psychotic features ([place]) Long Term Goal(s): Safe transition to appropriate next level of care at discharge, Engage patient in therapeutic group addressing interpersonal concerns.  Short Term Goals: Engage patient in aftercare planning with referrals and resources, Increase social support, Increase emotional regulation, Facilitate acceptance of mental health diagnosis and concerns, Identify triggers associated with mental  health/substance abuse issues, and Increase skills for wellness and recovery  Therapeutic Interventions: Assess for all discharge needs, 1 to 1 time with Social worker, Explore available resources and support systems, Assess for adequacy in community support network, Educate family and significant other(s) on suicide prevention, Complete Psychosocial Assessment, Interpersonal group therapy.  Evaluation of Outcomes: Not Met   Progress in Treatment: Attending groups: Yes. Participating in groups: Yes. Taking medication as prescribed: Yes. Toleration medication: Yes. Family/Significant other contact made: Yes, individual(s)  contacted:  Brother and friend  Patient understands diagnosis: Yes. Discussing patient identified problems/goals with staff: Yes. Medical problems stabilized or resolved: Yes. Denies suicidal/homicidal ideation: Yes. Issues/concerns per patient self-inventory: No.   New problem(s) identified: No, Describe:  None   New Short Term/Long Term Goal(s): medication stabilization, elimination of SI thoughts, development of comprehensive mental wellness plan.   Patient Goals: "To get out of the hospital"  Discharge Plan or Barriers: Patient recently admitted. CSW will continue to follow and assess for appropriate referrals and possible discharge planning.   Reason for Continuation of Hospitalization: Anxiety Depression Medication stabilization Suicidal ideation  Estimated Length of Stay: 3 to 7 days   Last 3 [place] Suicide Severity Risk Score: Flowsheet Row Admission (Current) from 02/13/2022 in [facility] ED from 02/12/2022 in [facility] ED from 06/26/2021 in [facility] CATEGORY High Risk High Risk No Risk       Last PHQ 2/9 Scores:    03/14/2021    9:36 AM 12/09/2020    9:26 AM 09/08/2020    8:41 AM  Depression screen PHQ 2/9  Decreased Interest 0 0 1  Down, Depressed, Hopeless 1 0 1  PHQ - 2 Score 1 0 2  Altered sleeping   1  Tired, decreased energy   3  Change in appetite   3  Feeling bad or failure about yourself    1  Trouble concentrating   1  Moving slowly or fidgety/restless   0  Suicidal thoughts   0  PHQ-9 Score   11    Scribe for Treatment Team: Darleen Crocker, Latanya Presser 02/14/2022 1:51 PM

## 2022-02-14 NOTE — Progress Notes (Signed)
Psychoeducational Group Note  Date:  02/14/2022 Time:  2125  Group Topic/Focus:  Relapse Prevention Planning:   The focus of this group is to define relapse and discuss the need for planning to combat relapse.  Participation Level: Did Not Attend  Participation Quality:  Not Applicable  Affect:  Not Applicable  Cognitive:  Not Applicable  Insight:  Not Applicable  Engagement in Group: Not Applicable  Additional Comments:  The patient did not attend group this evening.   Archie Balboa S 02/14/2022, 9:25 PM

## 2022-02-14 NOTE — Progress Notes (Signed)
    02/13/22 2137  Psych Admission Type (Psych Patients Only)  Admission Status Voluntary  Psychosocial Assessment  Patient Complaints None  Eye Contact Fair  Facial Expression Flat  Affect Depressed;Anxious  Speech Logical/coherent  Interaction Assertive  Motor Activity Other (Comment) (Unable to Assess)  Appearance/Hygiene Disheveled  Behavior Characteristics Cooperative;Appropriate to situation  Mood Depressed;Anxious  Thought Process  Coherency WDL  Content WDL  Delusions None reported or observed  Perception WDL  Hallucination None reported or observed  Judgment Impaired  Confusion WDL  Danger to Self  Current suicidal ideation? Denies  Danger to Others  Danger to Others None reported or observed   Patient presents with mild depression and anxiety.  Patient is alert and oriented.  Patient remains in bed and has no complaints.  Patient denies SI/HI/AVH and verbally contracts for safety.  Scheduled medication administered.  Patient was provided emotional support.  Patient is safe on the unit with Q 15 minute safety checks.

## 2022-02-14 NOTE — Group Note (Signed)
Recreation Therapy Group Note   Group Topic:Problem Solving  Group Date: 02/14/2022 Start Time: 0930 End Time: 1002 Facilitators: Jerelyn Trimarco-McCall, LRT,CTRS Location: [address] Dayroom   Goal Area(s) Addresses:  Patient will effectively work with peer towards shared goal.  Patient will identify skills used to make activity successful.  Patient will identify how skills used during activity can be used to reach post d/c goals.   Group Description: Landing Pad. In teams of 3-5, patients were given 12 plastic drinking straws and an equal length of masking tape. Using the materials provided, patients were asked to build a landing pad to catch a golf ball dropped from approximately 5 feet in the air. All materials were required to be used by the team in their design. LRT facilitated post-activity discussion.   Affect/Mood: N/A   Participation Level: Did not attend    Clinical Observations/Individualized Feedback:     Plan: Continue to engage patient in RT group sessions 2-3x/week.   Hyman Crossan-McCall, LRT,CTRS 02/14/2022 11:32 AM

## 2022-02-14 NOTE — H&P (Signed)
Psychiatric Admission Assessment Adult  Patient Identification: Margaret Blackburn  MRN:  [id number]  Date of Evaluation:  02/14/2022  Chief Complaint: Suicide attempt by overdose on multiple tablets of Alprazolam.  Principal Diagnosis: Major depressive disorder, recurrent severe without psychotic features ([place])  Diagnosis:  Principal Problem:   Major depressive disorder, recurrent severe without psychotic features (Margaret Blackburn)  History of Present Illness: This is the first psychiatric admission/evaluation in this [facility] for this 57 year old Caucasian female with hx of major depressive disorder & generalized anxiety disorder. Admitted to the [facility] from the [facility] ED with complaint of intentional overdose on multiple alprazolam tablets in a suicide attempt. Chart review reports indicated that after the overdose incident, patient texted her family members to tell them what she had done. She was then brought to the ED by the EMS for evaluation/treatments. A review of her toxicology results indicated her UDS was positive for Benzodiazepine & THC. After medical evaluation & clearance, Margaret Blackburn was brought to the [facility] for further psychiatric evaluation & treatments. During this evaluation, She reports,   "My friend Margaret Blackburn called the ambulance for me & the EMS took me to the [facility] ED. I took a bunch of Xanax pills two days ago to kill myself. I took 4 tablets of Xanax 0.5 mg that morning & in the afternoon, I took the rest. My friend called 911 after she came & saw me passed-out with an empty bottle of Xanax near me. I have a lot going on in my life. My depression started in 2020 after I was diagnosed & treated for breast cancer. Since after the cancer treatments, I have not been able to work. I have been living off of my children. I have not been able to get approved for disability. I did not have depression prior to 2020. I was healthy, a social butterfly & I have a good job, making good  morning. After they diagnosed me with depression in 2021, I was started on Zoloft, an anxiety medicine that starts with a 'B' & Xanax 0.5 mg.  I took the Xanax as needed. But, I stopped the anxiety medicine that starts with a B & the Zoloft because they were not helping me. I have been taking the Xanax as needed. It happened that I started feeling like I do not want to be alive any more. This has been going on for a month. I stayed in my room all the time. I was getting irritated easily. I have problem sleeping at night, although I was diagnosed with sleep apnea & I use a c-pap machine at night. And because my anxiety is so bad, I had to smoke weed on daily basis to handle it. My depression now is #3 & anxiety #5. I'm not feeling suicidal any more. I just regret what I had done. I miss my granddaughter, she is 24. I'm wondering if she can be allowed to visit me here. I have bad arthritic pain & back pain".  Objective: This case is discussed with the attending psychiatrist, it was agreed that since patient indicated that her symptoms did not respond well to Sertraline & the anxiety medicine that starts with 'B', she is now started on Cymbalta 60 mg daily for depression/pain & Hydroxyzine 25 mg po tid prn for anxiety. Patient has been instructed that she can no longer be resumed on Xanax since she overdosed on it. She was offered Gabapentin, which she decline & states had tried it in  the past & it made her sick. She is currently on Lyrical 150 mg daily for pain. Patient says family will be bring in her C-pap machine later today.   Associated Signs/Symptoms:  Depression Symptoms:  depressed mood, insomnia, feelings of worthlessness/guilt, hopelessness, anxiety,  (Hypo) Manic Symptoms:  Impulsivity,  Anxiety Symptoms:  Excessive Worry,  Psychotic Symptoms:   Denies any AVH, delusional thoughts or paranoia.  PTSD Symptoms: Patient denies. NA  Total Time spent with patient: 1 hour  Past Psychiatric  History: Major depressive disorder, recurrent episodes, Generalized anxiety disorder.  Is the patient at risk to self? No.  Has the patient been a risk to self in the past 6 months? Yes.    Has the patient been a risk to self within the distant past? Yes.    Is the patient a risk to others? No.  Has the patient been a risk to others in the past 6 months? No.  Has the patient been a risk to others within the distant past? No.   [place] Scale:  [facility] Admission (Current) from 02/13/2022 in [place] [facility] ED from 02/12/2022 in [place] DEPT ED from 06/26/2021 in [facility] CATEGORY High Risk High Risk No Risk      Prior Inpatient Therapy: No. If yes, describe: NA   Prior Outpatient Therapy: Yes.   If yes, describe: New provider, patient unable to remember the name).   Alcohol Screening: 1. How often do you have a drink containing alcohol?: Never 2. How many drinks containing alcohol do you have on a typical day when you are drinking?: 1 or 2 3. How often do you have six or more drinks on one occasion?: Never AUDIT-C Score: 0 4. How often during the last year have you found that you were not able to stop drinking once you had started?: Never 5. How often during the last year have you failed to do what was normally expected from you because of drinking?: Never 6. How often during the last year have you needed a first drink in the morning to get yourself going after a heavy drinking session?: Never 7. How often during the last year have you had a feeling of guilt of remorse after drinking?: Never 8. How often during the last year have you been unable to remember what happened the night before because you had been drinking?: Never 9. Have you or someone else been injured as a result of your drinking?: No 10. Has a relative or friend or a doctor or another health worker been  concerned about your drinking or suggested you cut down?: No Alcohol Use Disorder Identification Test Final Score (AUDIT): 0 Alcohol Brief Interventions/Follow-up: Alcohol education/Brief advice  Substance Abuse History in the last 12 months:  Yes.    Consequences of Substance Abuse: Discussed with patient during this admission evaluation. Medical Consequences:  Liver damage, Possible death by overdose Legal Consequences:  Arrests, jail time, Loss of driving privilege. Family Consequences:  Family discord, divorce and or separation.  Previous Psychotropic Medications:  Yes, Sertraline & Alprazolam.  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 ENDOSCOPY;  Service: Gastroenterology;  Laterality: N/A;   SENTINEL NODE BIOPSY Right 03/21/2018   Procedure: RIGHT SENTINEL LYMPH  NODE BIOPSY;  Surgeon: Vickie Epley, MD;  Location: [facility] ORS;  Service: General;  Laterality: Right;   Family History:  Family History  Problem Relation Age of Onset   Breast cancer Mother 22   Lung cancer Mother    Breast cancer Maternal Grandmother     Thyroid cancer Father    Family Psychiatric  History: Patient denies any familial hx of mental illnesses or health issues.  Tobacco Screening: Smokes 1 & 1/2 packs of cigarettes daily.  Social History   Tobacco Use  Smoking Status Every Day   Packs/day: 1.50   Years: 42.00   Total pack years: 63.00   Types: Cigarettes, E-cigarettes  Smokeless Tobacco Never  Tobacco Comments   Smokes a pack and a half daily    BH Tobacco Counseling     Are you interested in Tobacco Cessation Medications?  No value filed. Counseled patient on smoking cessation:  No value filed. Reason Tobacco Screening Not Completed: No value filed.       Social History: Single, has two children, lives in [place], [place], unemployed. Social History   Substance and Sexual Activity  Alcohol Use No     Social History   Substance and Sexual Activity  Drug Use Yes   Types: Marijuana   Comment: 1 joint daily    Additional Social History:  Allergies:   Allergies  Allergen Reactions   Penicillins Rash and Other (See Comments)    DID THE REACTION INVOLVE: Swelling of the face/tongue/throat, SOB, or low BP? No Sudden or severe rash/hives, skin peeling, or the inside of the mouth or nose? Yes, blisters. Not immediate. Did it require medical treatment? Yes When did it last happen? 40s If all above answers are "NO", may proceed with cephalosporin use.    Lab Results:  Results for orders placed or performed during the hospital encounter of 02/12/22 (from the past 48 hour(s))  CBG monitoring, ED     Status: None   Collection Time: 02/12/22  5:11 PM  Result Value Ref Range   Glucose-Capillary 92 70 - 99 mg/dL    Comment: Glucose reference range applies only to samples taken after fasting for at least 8 hours.  Rapid urine drug screen (hospital performed)     Status: Abnormal   Collection Time: 02/13/22  6:42 AM  Result Value Ref Range   Opiates NONE DETECTED NONE DETECTED   Cocaine NONE DETECTED NONE  DETECTED   Benzodiazepines POSITIVE (A) NONE DETECTED   Amphetamines NONE DETECTED NONE DETECTED   Tetrahydrocannabinol POSITIVE (A) NONE DETECTED   Barbiturates NONE DETECTED NONE DETECTED    Comment: (NOTE) DRUG SCREEN FOR MEDICAL PURPOSES ONLY.  IF CONFIRMATION IS NEEDED FOR ANY PURPOSE, NOTIFY LAB WITHIN  5 DAYS.  LOWEST DETECTABLE LIMITS FOR URINE DRUG SCREEN Drug Class                     Cutoff (ng/mL) Amphetamine and metabolites    1000 Barbiturate and metabolites    200 Benzodiazepine                 200 Opiates and metabolites        300 Cocaine and metabolites        300 THC                            50 Performed at [facility], [place] [address], [place], [place] [address]    Blood Alcohol level:  Lab Results  Component Value Date   ETH <10 60/45/4098   Metabolic Disorder Labs:  Lab Results  Component Value Date   HGBA1C 6.1 (A) 08/10/2020   No results found for: "PROLACTIN" Lab Results  Component Value Date   CHOL 154 12/05/2021   TRIG 94 12/05/2021   HDL 51 12/05/2021   CHOLHDL 3.0 12/05/2021   LDLCALC 86 12/05/2021   LDLCALC 113 (H) 03/22/2020   Current Medications: Current Facility-Administered Medications  Medication Dose Route Frequency Provider Last Rate Last Admin   acetaminophen (TYLENOL) tablet 650 mg  650 mg Oral Q6H PRN Onuoha, Josephine C, NP       albuterol (VENTOLIN HFA) 108 (90 Base) MCG/ACT inhaler 2 puff  2 puff Inhalation Q6H PRN Cleatrice Burke, Josephine C, NP       alum & mag hydroxide-simeth (MAALOX/MYLANTA) 200-200-20 MG/5ML suspension 30 mL  30 mL Oral Q4H PRN Cleatrice Burke, Josephine C, NP       ARIPiprazole (ABILIFY) tablet 2 mg  2 mg Oral Daily Lindell Spar I, NP       [START ON 02/15/2022] DULoxetine (CYMBALTA) DR capsule 60 mg  60 mg Oral Daily Fields Oros I, NP       hydrOXYzine (ATARAX) tablet 25 mg  25 mg Oral TID PRN Dian Situ, MD   25 mg at 02/14/22 1030   magnesium hydroxide (MILK OF MAGNESIA) suspension 30 mL   30 mL Oral Daily PRN Charmaine Downs C, NP       metFORMIN (GLUCOPHAGE) tablet 500 mg  500 mg Oral BID WC Onuoha, Josephine C, NP   500 mg at 02/14/22 0803   montelukast (SINGULAIR) tablet 10 mg  10 mg Oral QHS Charmaine Downs C, NP   10 mg at 02/13/22 2137   nicotine (NICODERM CQ - dosed in mg/24 hours) patch 21 mg  21 mg Transdermal Once Onuoha, Reginold Agent C, NP       nicotine (NICODERM CQ - dosed in mg/24 hours) patch 21 mg  21 mg Transdermal Daily Massengill, Ovid Curd, MD   21 mg at 02/14/22 0803   pregabalin (LYRICA) capsule 150 mg  150 mg Oral BID Charmaine Downs C, NP   150 mg at 02/14/22 0805   rosuvastatin (CRESTOR) tablet 5 mg  5 mg Oral Daily Charmaine Downs C, NP   5 mg at 02/14/22 0803   traZODone (DESYREL) tablet 50 mg  50 mg Oral QHS PRN Lindell Spar I, NP       PTA Medications: Medications Prior to Admission  Medication Sig Dispense Refill Last Dose   albuterol (VENTOLIN HFA) 108 (90 Base) MCG/ACT inhaler Inhale 2 puffs into the lungs every 6 (six) hours as needed for wheezing or  shortness of breath. 18 g 5    ALPRAZolam (XANAX) 0.5 MG tablet Take 1 tablet (0.5 mg total) by mouth 2 (two) times daily as needed for anxiety or sleep. May take 1 extra tablet for severe anxiety/panic daily prn 70 tablet 2    azelastine (ASTELIN) 0.1 % nasal spray Place 1 spray into both nostrils 2 (two) times daily. Use in each nostril as directed 30 mL 12    Calcium Carb-Cholecalciferol (CALCIUM 600+D3 PO) Take 1 tablet by mouth in the morning and at bedtime.      cyclobenzaprine (FLEXERIL) 10 MG tablet Take 1 tablet (10 mg total) by mouth 3 (three) times daily as needed for muscle spasms. 60 tablet 0    diclofenac (VOLTAREN) 75 MG EC tablet Take 1 tablet (75 mg total) by mouth 2 (two) times daily. 60 tablet 1    Fluticasone-Umeclidin-Vilant (TRELEGY ELLIPTA) 100-62.5-25 MCG/ACT AEPB Inhale 1 puff into the lungs daily. 1 each 11    levocetirizine (XYZAL) 5 MG tablet Take 1 tablet (5 mg total) by  mouth every evening. 90 tablet 1    metFORMIN (GLUCOPHAGE) 500 MG tablet Take 1 tablet (500 mg total) by mouth 2 (two) times daily with a meal. 180 tablet 3    montelukast (SINGULAIR) 10 MG tablet Take 1 tablet (10 mg total) by mouth at bedtime. 90 tablet 3    Multiple Vitamins-Minerals (ALIVE WOMENS ENERGY PO) Take 1 Dose by mouth daily.      pregabalin (LYRICA) 150 MG capsule Take 1 capsule (150 mg total) by mouth 2 (two) times daily. 60 capsule 2    rosuvastatin (CRESTOR) 5 MG tablet Take 1 tablet (5 mg total) by mouth daily. 90 tablet 3    Musculoskeletal: Strength & Muscle Tone: within normal limits Gait & Station: normal Patient leans: N/A  Psychiatric Specialty Exam:  Presentation  General Appearance:  Casual; Disheveled  Eye Contact: Good  Speech: Clear and Coherent; Normal Rate  Speech Volume: Normal  Handedness: Right   Mood and Affect  Mood: Depressed; Anxious  Affect: Non-Congruent  Thought Process  Thought Processes: Coherent; Linear  Duration of Psychotic Symptoms: Greater than two weeks.  Past Diagnosis of Schizophrenia or Psychoactive disorder: No data recorded  Descriptions of Associations:Intact  Orientation:Full (Time, Place and Person)  Thought Content:Logical  Hallucinations:Hallucinations: None Description of Visual Hallucinations: NA  Ideas of Reference:None  Suicidal Thoughts:Suicidal Thoughts: No SI Active Intent and/or Plan: Without Intent; Without Plan; Without Means to Carry Out; Without Access to Means  Homicidal Thoughts:Homicidal Thoughts: No  Sensorium  Memory: Immediate Good; Recent Good; Remote Good  Judgment: Good  Insight: Fair  Executive Functions  Concentration: Good  Attention Span: Good  Recall: Good  Fund of Knowledge: Good  Language: Good  Psychomotor Activity  Psychomotor Activity:Psychomotor Activity: Normal  Assets  Assets: Communication Skills; Desire for Improvement; Housing;  Physical Health; Resilience; Social Support  Sleep  Sleep:Sleep: Fair Number of Hours of Sleep: 5.5  Physical Exam: Physical Exam Vitals and nursing note reviewed.  HENT:     Head: Normocephalic.     Nose: Nose normal.     Mouth/Throat:     Pharynx: Oropharynx is clear.  Eyes:     Pupils: Pupils are equal, round, and reactive to light.  Cardiovascular:     Rate and Rhythm: Normal rate.     Comments: Pulse rate slightly elevated: 104.  Patient is currently in no apparent distress.  Pulmonary:     Effort: Pulmonary effort is normal.  Genitourinary:    Comments: Deferred Musculoskeletal:        General: Normal range of motion.     Cervical back: Normal range of motion.  Skin:    General: Skin is warm and dry.  Neurological:     General: No focal deficit present.     Mental Status: She is alert and oriented to person, place, and time.    Review of Systems  Constitutional:  Negative for chills, diaphoresis and fever.  HENT:  Negative for congestion and sore throat.   Respiratory:  Negative for cough, shortness of breath and wheezing.   Cardiovascular:  Negative for chest pain and palpitations.       Pulse rate slightly elevated: 104.  Patient is currently in no apparent distress.    Gastrointestinal:  Negative for abdominal pain, constipation, diarrhea, heartburn, nausea and vomiting.  Genitourinary:  Negative for dysuria.  Musculoskeletal:  Positive for back pain, joint pain and myalgias.  Skin:  Negative for itching and rash.  Neurological:  Negative for dizziness, tingling, tremors, sensory change, speech change, focal weakness, seizures, loss of consciousness, weakness and headaches.  Endo/Heme/Allergies:        PCN.  Psychiatric/Behavioral:  Positive for depression and substance abuse (uDS was (+) for benzodiazepine & THC,). Negative for hallucinations, memory loss and suicidal ideas. The patient is nervous/anxious and has insomnia (Hx. Sleep apnea.).    Blood  pressure 118/72, pulse (!) 104, temperature (!) 97.4 F (36.3 C), temperature source Oral, resp. rate 20, height '5\' 2"'$  (1.575 m), weight 88.5 kg, SpO2 100 %. Body mass index is 35.67 kg/m.  Treatment Plan Summary: Daily contact with patient to assess and evaluate symptoms and progress in treatment and Medication management.   Principal/active diagnoses. Major depressive disorder, recurrent severe without psychotic features ([place])  Plan: -Initiated Cymbalta 60 mg po daily for depression. -Initiated Abilify 2 mg po daily to augment Cymbalta for tx of depression. -Hydroxyzine 25 mg po tid prn for anxiety.  -Trazodone 50 mg po Q hs prn for insomnia.  -Nicotine patch 21 mg topically Q 24 hours for smoking cessation.  Other medical issues.  -Continue Metformin 500 mg po bid for DM.  -Continue Lyrica 150 mg po bid for pain management.  -Continue Crestor 5 mg po Q daily for hyperlipidemia. -Singulair 10 mg po for COPD.  Labs: Will obtain Hgba1c.   Other PRNS -Continue Tylenol 650 mg every 6 hours PRN for mild pain -Continue Maalox 30 ml Q 4 hrs PRN for indigestion -Continue MOM 30 ml po Q 6 hrs for constipation  Safety and Monitoring: Voluntary admission to inpatient psychiatric unit for safety, stabilization and treatment Daily contact with patient to assess and evaluate symptoms and progress in treatment Patient's case to be discussed in multi-disciplinary team meeting Observation Level : q15 minute checks Vital signs: q12 hours Precautions: Safety  Discharge Planning: Social work and case management to assist with discharge planning and identification of hospital follow-up needs prior to discharge Estimated LOS: 5-7 days Discharge Concerns: Need to establish a safety plan; Medication compliance and effectiveness Discharge Goals: Return home with outpatient referrals for mental health follow-up including medication management/psychotherapy  Observation Level/Precautions:  15  minute checks  Laboratory:   Per ED, reviewed current lab results.  Psychotherapy: Enrolled in the group sessions  Medications: See [facility]    Consultations: As needed.   Discharge Concerns: Safety, mood stability.  Estimated LOS: 3-5 days  Other: NA   Physician Treatment Plan for Primary Diagnosis:  Major depressive disorder, recurrent severe without psychotic features ([place])  Long Term Goal(s): Improvement in symptoms so as ready for discharge  Short Term Goals: Ability to identify changes in lifestyle to reduce recurrence of condition will improve, Ability to verbalize feelings will improve, Ability to disclose and discuss suicidal ideas, and Ability to demonstrate self-control will improve  Physician Treatment Plan for Secondary Diagnosis: Principal Problem:   Major depressive disorder, recurrent severe without psychotic features ([place])  Long Term Goal(s): Improvement in symptoms so as ready for discharge  Short Term Goals: Ability to identify and develop effective coping behaviors will improve, Ability to maintain clinical measurements within normal limits will improve, and Compliance with prescribed medications will improve  I certify that inpatient services furnished can reasonably be expected to improve the patient's condition.    Lindell Spar, NP, pmhnp, fnp-bc. 1/10/20243:16 PM

## 2022-02-14 NOTE — [facility] Suicide Risk Assessment (Signed)
Suicide Risk Assessment  Admission Assessment    [facility] Admission Suicide Risk Assessment   Nursing information obtained from:     Demographic factors:  Divorced or widowed  Current Mental Status:  NA  Loss Factors:  NA  Historical Factors:  Family history of mental illness or substance abuse  Risk Reduction Factors:  Living with another person, especially a relative, Sense of responsibility to family, Positive social support  Total Time spent with patient: 1 hour  Principal Problem: Major depressive disorder, recurrent severe without psychotic features ([place])  Diagnosis:  Principal Problem:   Major depressive disorder, recurrent severe without psychotic features ([place])  Subjective Data: See H&P.  Continued Clinical Symptoms:  Alcohol Use Disorder Identification Test Final Score (AUDIT): 0 The "Alcohol Use Disorders Identification Test", Guidelines for Use in Primary Care, Second Edition.  World Pharmacologist [facility]). Score between 0-7:  no or low risk or alcohol related problems. Score between 8-15:  moderate risk of alcohol related problems. Score between 16-19:  high risk of alcohol related problems. Score 20 or above:  warrants further diagnostic evaluation for alcohol dependence and treatment.  CLINICAL FACTORS:   Severe Anxiety and/or Agitation Depression:   Hopelessness Impulsivity Insomnia Alcohol/Substance Abuse/Dependencies More than one psychiatric diagnosis Previous Psychiatric Diagnoses and Treatments  Musculoskeletal: Strength & Muscle Tone: within normal limits Gait & Station: normal Patient leans: N/A  Psychiatric Specialty Exam:  Presentation  General Appearance:  Casual; Disheveled  Eye Contact: Fleeting  Speech: Clear and Coherent; Normal Rate  Speech Volume: Normal  Handedness: Right  Mood and Affect  Mood: Hopeless; Anxious; Depressed  Affect: Congruent; Depressed; Tearful  Thought Process  Thought Processes: Coherent;  Goal Directed; Linear  Descriptions of Associations:Intact  Orientation:Full (Time, Place and Person)  Thought Content:Logical  History of Schizophrenia/Schizoaffective disorder:No data recorded Duration of Psychotic Symptoms:No data recorded Hallucinations:No data recorded Ideas of Reference:None  Suicidal Thoughts:No data recorded Homicidal Thoughts:No data recorded  Sensorium  Memory: Immediate Good; Recent Good; Remote Good  Judgment: Intact  Insight: Present   Executive Functions  Concentration: Fair  Attention Span: Fair  Recall: [employer] of Knowledge: Fair  Language: Fair  Psychomotor Activity  Psychomotor Activity:No data recorded  Assets  Assets: Armed forces logistics/support/administrative officer; Housing; Social Support  Sleep  Sleep:No data recorded  Physical Exam: See H&P. Blood pressure 118/72, pulse (!) 104, temperature (!) 97.4 F (36.3 C), temperature source Oral, resp. rate 20, height '5\' 2"'$  (1.575 m), weight 88.5 kg, SpO2 100 %. Body mass index is 35.67 kg/m.  COGNITIVE FEATURES THAT CONTRIBUTE TO RISK:  Closed-mindedness, Polarized thinking, and Thought constriction (tunnel vision)    SUICIDE RISK:   Severe:  Frequent, intense, and enduring suicidal ideation, specific plan, no subjective intent, but some objective markers of intent (i.e., choice of lethal method), the method is accessible, some limited preparatory behavior, evidence of impaired self-control, severe dysphoria/symptomatology, multiple risk factors present, and few if any protective factors, particularly a lack of social support.  PLAN OF CARE: See H&P.  I certify that inpatient services furnished can reasonably be expected to improve the patient's condition.   Lindell Spar, NP, pmhnp, fnp-bc 02/14/2022, 8:53 AM

## 2022-02-14 NOTE — Progress Notes (Signed)
     02/14/22 2121  Psych Admission Type (Psych Patients Only)  Admission Status Voluntary  Psychosocial Assessment  Patient Complaints Anxiety;Depression  Eye Contact Fair  Facial Expression Flat  Affect Depressed;Anxious  Speech Logical/coherent  Interaction Assertive  Motor Activity Other (Comment) (WDL)  Appearance/Hygiene Improved  Behavior Characteristics Cooperative;Appropriate to situation  Mood Depressed;Anxious  Thought Process  Coherency WDL  Content WDL  Delusions None reported or observed  Perception WDL  Hallucination None reported or observed  Judgment Impaired  Confusion WDL  Danger to Self  Current suicidal ideation? Denies  Agreement Not to Harm Self Yes  Description of Agreement verbal  Danger to Others  Danger to Others None reported or observed

## 2022-02-14 NOTE — Plan of Care (Signed)

## 2022-02-14 NOTE — Plan of Care (Signed)
  Problem: Education: Goal: Mental status will improve Outcome: Progressing   Problem: Activity: Goal: Interest or engagement in activities will improve Outcome: Progressing   Problem: Medication: Goal: Compliance with prescribed medication regimen will improve Outcome: Progressing   Problem: Education: Goal: Emotional status will improve Outcome: Not Progressing

## 2022-02-14 NOTE — Progress Notes (Signed)
   02/14/22 0804  Psych Admission Type (Psych Patients Only)  Admission Status Voluntary  Psychosocial Assessment  Patient Complaints Anxiety  Eye Contact Fair  Facial Expression Flat  Affect Anxious;Depressed  Speech Incoherent  Interaction Assertive  Motor Activity Other (Comment) (WNL)  Appearance/Hygiene In scrubs  Behavior Characteristics Cooperative;Appropriate to situation  Mood Depressed;Anxious  Thought Process  Coherency WDL  Content WDL  Delusions None reported or observed  Perception WDL  Hallucination None reported or observed  Judgment Impaired  Confusion WDL  Danger to Self  Current suicidal ideation? Denies  Agreement Not to Harm Self Yes  Description of Agreement verbal  Danger to Others  Danger to Others None reported or observed

## 2022-02-14 NOTE — Plan of Care (Signed)
  Problem: Education: Goal: Emotional status will improve Outcome: Not Progressing Goal: Mental status will improve Outcome: Not Progressing   Problem: Activity: Goal: Interest or engagement in activities will improve Outcome: Not Progressing

## 2022-02-15 LAB — HEMOGLOBIN A1C
Hgb A1c MFr Bld: 5.8 % — ABNORMAL HIGH (ref 4.8–5.6)
Mean Plasma Glucose: 119.76 mg/dL

## 2022-02-15 NOTE — Progress Notes (Signed)
Patient is cooperative on unit and observe attending groups. Patient currently denies SI, HI,  and A/V/H with no plan/intent. . Patient did receive hydroxyzine from being anxious earlier today and also reported her lyrica has not been effective in helping her chronic pain reporting pain 7 out of 10 at her back. Patient refused tylenol due to tylenol "not helping either." Patient does state her depression is a 5 but denies current anxiety.Patient goal for today is to go to group meetings. No s/s of current distress.    02/15/22 0815  Psych Admission Type (Psych Patients Only)  Admission Status Voluntary  Psychosocial Assessment  Patient Complaints Depression;Sleep disturbance  Eye Contact Fair  Facial Expression Flat  Affect Depressed  Speech Logical/coherent  Interaction Assertive  Motor Activity Other (Comment)  Appearance/Hygiene Unremarkable  Behavior Characteristics Cooperative;Appropriate to situation  Mood Depressed;Anxious  Thought Process  Coherency WDL  Content WDL  Delusions None reported or observed  Perception WDL  Hallucination None reported or observed  Judgment Impaired  Confusion None  Danger to Self  Current suicidal ideation? Denies  Agreement Not to Harm Self Yes  Description of Agreement verbally contracts for safety  Danger to Others  Danger to Others None reported or observed

## 2022-02-15 NOTE — B[facility] Group Notes (Deleted)
Adult Psychoeducational Group Note  Date:  02/15/2022 Time:  9:43 AM  Group Topic/Focus:  Goals Group:   The focus of this group is to help patients establish daily goals to achieve during treatment and discuss how the patient can incorporate goal setting into their daily lives to aide in recovery.  Participation Level:  Active  Participation Quality:  Appropriate  Affect:  Appropriate  Cognitive:  Appropriate  Insight: Appropriate  Engagement in Group:  Engaged  Modes of Intervention:  Discussion  Additional Comments:  Patient gaol of the day is to work on completing his safety plan.   [name] 02/15/2022, 9:43 AM

## 2022-02-15 NOTE — [facility] Group Notes (Signed)
Adult Psychoeducational Group Note  Date:  02/15/2022 Time:  6:06 PM  Group Topic/Focus:  Managing Feelings:   The focus of this group is to identify what feelings patients have difficulty handling and develop a plan to handle them in a healthier way upon discharge.  Participation Level:  Did Not Attend  Participation Quality:   did not attend  Affect:   did not attend  Cognitive:  did not attend  Insight: None  Engagement in Group:   did not attend  Modes of Intervention:   did not attend  Additional Comments:  Pt did not attend  Dvon Jiles R Jaisha Villacres 02/15/2022, 6:06 PM

## 2022-02-15 NOTE — Progress Notes (Signed)
[facility] MD Progress Note  02/15/2022 3:13 PM Margaret Blackburn  MRN:  [id number]  Reason for admission: Caucasian female with hx of major depressive disorder & generalized anxiety disorder. Admitted to the [facility] from the [facility] ED with complaint of intentional overdose on multiple alprazolam tablets in a suicide attempt.   Daily note: Margaret Blackburn is seen, chart reviewed. The chart findings discussed with the treatment team. She presents alert, oriented & aware of situation. She is visible on the unit, attending group sessions. She presents with an improved affect, verbally response. She is making a good eye contact. She reports,  "I'm doing alright this morning. I just had a little moment a while ago after I talked with my daughter. I told her that I'm not sure if I want to come back home after discharge because I don't like what has been going on in the house.  My daughter is a drug addict. She is on Suboxone & Adderall. She also has epilepsy. My son-in law (her husband) has to go to a clinic every morning to dose his methadone for his opioid addiction treatment. I do think that my son is probably using drugs as well. But I love my grand-daughter, she needs me. We all live in that house. My children has been supporting me since I have not been able to work or make money. I'm glad that I came to the hospital, if nothing else, to get a break from all the drama at home. I'm not feeling like hurting myself or others. I'm not hearing voices or seeing thing. I feel a lot better. I'm working on starting to make my own herbal medicines using the spices that I will grow in my garden myself. I cannot wait for spring to come". Margaret Blackburn currently denies any delusional thoughts or paranoia. She does not appear to be responding to any internal stimuli. She is taking & tolerating her treatment regime. Denies any side effects. Sh says she slept well last night. There are no changes made on her current plan of care. Will  continue as already in progress below. Reviewed current vital signs, pulse rate slightly elevated (105). Patient is in no apparent distress. Reviewed current lab results, Hgba1c 5.8.  Patient is instructed & encouraged to not make any drastic decision to not go home after discharge, rather to go home after discharge & gradually plan on moving into a different residence. She is in agreement.  Principal Problem: Major depressive disorder, recurrent severe without psychotic features ([place])  Diagnosis: Principal Problem:   Major depressive disorder, recurrent severe without psychotic features ([place])  Total Time spent with patient:  35 minutes  Past Psychiatric History: See H&P.  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   BREAST LUMPECTOMY WITH NEEDLE LOCALIZATION Right 03/03/2018   Procedure: BREAST LUMPECTOMY WITH NEEDLE LOCALIZATION;  Surgeon: Tama High  Ellard Artis, MD;  Location: [facility] ORS;  Service: General;  Laterality: Right;   COLONOSCOPY WITH PROPOFOL N/A 08/31/2019   Procedure: COLONOSCOPY WITH PROPOFOL;  Surgeon: Jonathon Bellows, MD;  Location: [facility] ENDOSCOPY;  Service: Gastroenterology;  Laterality: N/A;   SENTINEL NODE BIOPSY Right 03/21/2018   Procedure: RIGHT SENTINEL LYMPH  NODE BIOPSY;  Surgeon: Vickie Epley, MD;  Location: [facility] ORS;  Service: General;  Laterality: Right;   Family History:  Family History  Problem Relation Age of Onset   Breast cancer Mother 6   Lung cancer  Mother    Breast cancer Maternal Grandmother    Thyroid cancer Father    Family Psychiatric  History: See H&P  Social History:  Social History   Substance and Sexual Activity  Alcohol Use No     Social History   Substance and Sexual Activity  Drug Use Yes   Types: Marijuana   Comment: 1 joint daily    Social History   Socioeconomic History   Marital status: Single    Spouse name: Not on file   Number of children: 2   Years of education: Not on file   Highest education level: Not on file  Occupational History   Not on file  Tobacco Use   Smoking status: Every Day    Packs/day: 1.50    Years: 42.00    Total pack years: 63.00    Types: Cigarettes, E-cigarettes   Smokeless tobacco: Never   Tobacco comments:    Smokes a pack and a half daily  Vaping Use   Vaping Use: Former   Devices: used  2 weeks per pt  Substance and Sexual Activity   Alcohol use: No   Drug use: Yes    Types: Marijuana    Comment: 1 joint daily   Sexual activity: Not Currently  Other Topics Concern   Not on file  Social History Narrative   Not on file   Social Determinants of Health   Financial Resource Strain: Not on file  Food Insecurity: No Food Insecurity (02/13/2022)   Hunger Vital Sign    Worried About Running Out of Food in the Last Year: Never true    Ran Out of Food in the Last Year: Never true  Transportation Needs: No Transportation Needs (02/13/2022)   PRAPARE - Hydrologist (Medical): No    Lack of Transportation (Non-Medical): No  Physical Activity: Not on file  Stress: Not on file  Social Connections: Not on file   Additional Social History:   Sleep: Good  Appetite:  Good  Current Medications: Current Facility-Administered Medications  Medication Dose Route Frequency Provider Last Rate Last Admin   acetaminophen (TYLENOL) tablet 650 mg  650 mg Oral Q6H PRN Onuoha, Josephine C, NP       albuterol (VENTOLIN HFA) 108 (90 Base) MCG/ACT  inhaler 2 puff  2 puff Inhalation Q6H PRN Charmaine Downs C, NP       alum & mag hydroxide-simeth (MAALOX/MYLANTA) 200-200-20 MG/5ML suspension 30 mL  30 mL Oral Q4H PRN Cleatrice Burke, Josephine C, NP       ARIPiprazole (ABILIFY) tablet 2 mg  2 mg Oral Daily Mayanna Garlitz I, NP   2 mg at 02/15/22 0802   DULoxetine (CYMBALTA) DR capsule 60 mg  60 mg Oral Daily Lindell Spar I, NP   60 mg at 02/15/22 0802   hydrOXYzine (ATARAX) tablet 25 mg  25 mg Oral TID PRN Dian Situ, MD   25  mg at 02/14/22 2121   magnesium hydroxide (MILK OF MAGNESIA) suspension 30 mL  30 mL Oral Daily PRN Charmaine Downs C, NP       metFORMIN (GLUCOPHAGE) tablet 500 mg  500 mg Oral BID WC Onuoha, Josephine C, NP   500 mg at 02/15/22 0802   montelukast (SINGULAIR) tablet 10 mg  10 mg Oral QHS Charmaine Downs C, NP   10 mg at 02/14/22 2121   nicotine (NICODERM CQ - dosed in mg/24 hours) patch 21 mg  21 mg Transdermal Once Onuoha, Reginold Agent C, NP       nicotine (NICODERM CQ - dosed in mg/24 hours) patch 21 mg  21 mg Transdermal Daily Massengill, Nathan, MD   21 mg at 02/15/22 0802   pregabalin (LYRICA) capsule 150 mg  150 mg Oral BID Charmaine Downs C, NP   150 mg at 02/15/22 0802   rosuvastatin (CRESTOR) tablet 5 mg  5 mg Oral Daily Charmaine Downs C, NP   5 mg at 02/15/22 0802   traZODone (DESYREL) tablet 50 mg  50 mg Oral QHS PRN Lindell Spar I, NP   50 mg at 02/14/22 2121   Lab Results:  Results for orders placed or performed during the hospital encounter of 02/13/22 (from the past 48 hour(s))  Hemoglobin A1c     Status: Abnormal   Collection Time: 02/15/22  6:28 AM  Result Value Ref Range   Hgb A1c MFr Bld 5.8 (H) 4.8 - 5.6 %    Comment: (NOTE) Pre diabetes:          5.7%-6.4%  Diabetes:              >6.4%  Glycemic control for   <7.0% adults with diabetes    Mean Plasma Glucose 119.76 mg/dL    Comment: Performed at [facility], [place] [address] [place], [place] [address]   Blood Alcohol level:  Lab  Results  Component Value Date   ETH <10 79/03/4095   Metabolic Disorder Labs: Lab Results  Component Value Date   HGBA1C 5.8 (H) 02/15/2022   MPG 119.76 02/15/2022   No results found for: "PROLACTIN" Lab Results  Component Value Date   CHOL 154 12/05/2021   TRIG 94 12/05/2021   HDL 51 12/05/2021   CHOLHDL 3.0 12/05/2021   LDLCALC 86 12/05/2021   LDLCALC 113 (H) 03/22/2020   Physical Findings: AIMS:  , ,  ,  ,    CIWA:    COWS:     Musculoskeletal: Strength & Muscle Tone: within normal limits Gait & Station: normal Patient leans: N/A  Psychiatric Specialty Exam:  Presentation  General Appearance:  Casual; Disheveled; Fairly Groomed  Eye Contact: Good  Speech: Clear and Coherent; Normal Rate  Speech Volume: Normal  Handedness: Right   Mood and Affect  Mood: Depressed; Anxious  Affect: Non-Congruent  Thought Process  Thought Processes: Coherent; Linear  Descriptions of Associations:Intact  Orientation:Full (Time, Place and Person)  Thought Content:Logical  History of Schizophrenia/Schizoaffective disorder: NA  Duration of Psychotic Symptoms: NA  Hallucinations:Hallucinations: None Description of Visual Hallucinations: NA  Ideas of Reference:None  Suicidal Thoughts:Suicidal Thoughts: No SI Active Intent and/or Plan: Without Intent; Without Plan; Without Means to Carry Out; Without Access to Means  Homicidal Thoughts:Homicidal Thoughts: No   Sensorium  Memory: Immediate Good; Recent Good; Remote Good  Judgment: Good  Insight: Fair   Executive Functions  Concentration: Good  Attention Span: Good  Recall: Good  Fund of Knowledge: Good  Language: Good  Psychomotor Activity  Psychomotor Activity: Psychomotor Activity: Normal  Assets  Assets: Communication Skills; Desire for Improvement; Housing; Physical Health; Resilience; Social Support   Sleep  Sleep: Sleep: Good Number of Hours of Sleep: 6.5  Physical  Exam: Physical Exam Vitals and nursing note reviewed.  HENT:     Head: Normocephalic.     Nose: Nose normal.     Mouth/Throat:     Pharynx: Oropharynx is clear.  Eyes:     Pupils: Pupils are equal, round, and reactive to light.  Cardiovascular:     Rate and Rhythm: Normal rate.  Pulmonary:     Effort: Pulmonary effort is normal.  Genitourinary:    Comments: Deferred Musculoskeletal:        General: Normal range of motion.     Cervical back: Normal range of motion.  Skin:    General: Skin is warm and dry.  Neurological:     General: No focal deficit present.     Mental Status: She is alert and oriented to person, place, and time.    Review of Systems  Constitutional:  Negative for chills, diaphoresis and fever.  HENT:  Negative for sinus pain and sore throat.   Eyes:  Negative for blurred vision.  Respiratory:  Negative for cough, shortness of breath and wheezing.   Cardiovascular:  Negative for chest pain and palpitations.  Gastrointestinal:  Negative for abdominal pain, constipation, diarrhea, heartburn, nausea and vomiting.  Genitourinary:  Negative for dysuria.  Musculoskeletal:  Negative for joint pain and myalgias.  Skin:  Negative for itching and rash.  Neurological:  Negative for dizziness, tingling, tremors, sensory change, speech change, focal weakness, seizures, loss of consciousness, weakness and headaches.  Endo/Heme/Allergies:        Allergies: PCN  Psychiatric/Behavioral:  Positive for depression and substance abuse (UDS (+) for benzodiazepine & THC.). Negative for hallucinations, memory loss and suicidal ideas. The patient is nervous/anxious. The patient does not have insomnia.    Blood pressure 110/80, pulse (!) 105, temperature 97.8 F (36.6 C), temperature source Oral, resp. rate 20, height '5\' 2"'$  (1.575 m), weight 88.5 kg, SpO2 98 %. Body mass index is 35.67 kg/m.  Treatment Plan Summary: Daily contact with patient to assess and evaluate symptoms and  progress in treatment and Medication management.   Continue inpatient hospitalization.  Will continue today 02/15/2022 plan as below except where it is noted.   Principal/active diagnoses. Major depressive disorder, recurrent severe without psychotic features ([place])  Plan: -Continue Cymbalta 60 mg po daily for depression. -Continue Abilify 2 mg po daily to augment Cymbalta for tx of depression. -Continue Hydroxyzine 25 mg po tid prn for anxiety.  -Continue Trazodone 50 mg po Q hs prn for insomnia.  -Continue Nicotine patch 21 mg topically Q 24 hours for smoking cessation.   Other medical issues.  -Continue Metformin 500 mg po bid for DM.  -Continue Lyrica 150 mg po bid for pain management.  -Continue Crestor 5 mg po Q daily for hyperlipidemia. -Continue Singulair 10 mg po for COPD.  Labs: Will obtain Hgba1c: Result reviewed, 5.8.   Other PRNS -Continue Tylenol 650 mg every 6 hours PRN for mild pain -Continue Maalox 30 ml Q 4 hrs PRN for indigestion -Continue MOM 30 ml po Q 6 hrs for constipation   Safety and Monitoring: Voluntary admission to inpatient psychiatric unit for safety, stabilization and treatment Daily contact with patient to assess and evaluate symptoms and progress in treatment Patient's case to be  discussed in multi-disciplinary team meeting Observation Level : q15 minute checks Vital signs: q12 hours Precautions: Safety   Discharge Planning: Social work and case management to assist with discharge planning and identification of hospital follow-up needs prior to discharge Estimated LOS: 5-7 days Discharge Concerns: Need to establish a safety plan; Medication compliance and effectiveness Discharge Goals: Return home with outpatient referrals for mental health follow-up including medication management/psychotherapy  Lindell Spar, NP, pmhnp, fnp-bc. 02/15/2022, 3:13 PM

## 2022-02-15 NOTE — Plan of Care (Signed)
  Problem: Education: Goal: Mental status will improve Outcome: Progressing   Problem: Safety: Goal: Periods of time without injury will increase Outcome: Progressing   Problem: Medication: Goal: Compliance with prescribed medication regimen will improve Outcome: Progressing

## 2022-02-15 NOTE — [facility] Group Notes (Signed)
Adult Psychoeducational Group Note  Date:  02/15/2022 Time:  9:36 AM  Group Topic/Focus:  Goals Group:   The focus of this group is to help patients establish daily goals to achieve during treatment and discuss how the patient can incorporate goal setting into their daily lives to aide in recovery.  Participation Level:  Active  Participation Quality:  Appropriate  Affect:  Appropriate  Cognitive:  Appropriate  Insight: Appropriate  Engagement in Group:  Engaged  Modes of Intervention:  Discussion  Additional Comments:  Patient goal of the day is to work on going home.   [name] 02/15/2022, 9:36 AM

## 2022-02-15 NOTE — [facility] Suicide Risk Assessment (Signed)
[place] INPATIENT:  Family/Significant Other Suicide Prevention Education  Suicide Prevention Education:  Education Completed; Margaret Blackburn [phone] (Brother) has been identified by the patient as the family member/significant other with whom the patient will be residing, and identified as the person(s) who will aid the patient in the event of a mental health crisis (suicidal ideations/suicide attempt).  With written consent from the patient, the family member/significant other has been provided the following suicide prevention education, prior to the and/or following the discharge of the patient.  The suicide prevention education provided includes the following: Suicide risk factors Suicide prevention and interventions National Suicide Hotline telephone number [facility] assessment telephone number [facility] Emergency Assistance [place] and/or Residential Mobile Crisis Unit telephone number  Request made of family/significant other to: Remove weapons (e.g., guns, rifles, knives), all items previously/currently identified as safety concern.   Remove drugs/medications (over-the-counter, prescriptions, illicit drugs), all items previously/currently identified as a safety concern.  The family member/significant other verbalizes understanding of the suicide prevention education information provided.  The family member/significant other agrees to remove the items of safety concern listed above.  CSW spoke with Margaret Blackburn who states that his sister has been "battling depression for the past month".  He states that some days his sister is not showering or getting out of bed.  He states that he visits with his sister "at least a couple times a week".  Margaret Blackburn confirms that his sister can return to her own home after discharge.  He states that her children live there with her and are a support for her.  He states that there are no firearms or weapons in his sister's  home.  Margaret Blackburn also confirms that he the Power of Attorney for his sister.  CSW completed SPE with Margaret Blackburn.   Margaret Blackburn Margaret Blackburn 02/15/2022, 1:42 PM

## 2022-02-15 NOTE — Progress Notes (Signed)
Administered PRN Hydroxyzine and Trazodone per [facility] per pt request.

## 2022-02-15 NOTE — Progress Notes (Signed)
[place] Group Notes:  (Nursing/MHT/Case Management/Adjunct)  Date:  02/15/2022  Time:  2015  Type of Therapy:   wrap up group  Participation Level:  Active  Participation Quality:  Appropriate, Attentive, Sharing, and Supportive  Affect:  Depressed and Resistant  Cognitive:  Alert  Insight:  Improving  Engagement in Group:  Engaged  Modes of Intervention:  Clarification, Education, and Support  Summary of Progress/Problems: Positive thinking and positive change were discussed.  Shellia Cleverly 02/15/2022, 9:20 PM

## 2022-02-15 NOTE — Group Note (Signed)
LCSW Group Therapy Note   Group Date: 02/15/2022 Start Time: 1100 End Time: 1200   Type of Therapy and Topic:  Group Therapy: Boundaries  Participation Level:  Minimal  Description of Group: This group will address the use of boundaries in their personal lives. Patients will explore why boundaries are important, the difference between healthy and unhealthy boundaries, and negative and postive outcomes of different boundaries and will look at how boundaries can be crossed.  Patients will be encouraged to identify current boundaries in their own lives and identify what kind of boundary is being set. Facilitators will guide patients in utilizing problem-solving interventions to address and correct types boundaries being used and to address when no boundary is being used. Understanding and applying boundaries will be explored and addressed for obtaining and maintaining a balanced life. Patients will be encouraged to explore ways to assertively make their boundaries and needs known to significant others in their lives, using other group members and facilitator for role play, support, and feedback.  Therapeutic Goals:  1.  Patient will identify areas in their life where setting clear boundaries could be  used to improve their life.  2.  Patient will identify signs/triggers that a boundary is not being respected. 3.  Patient will identify two ways to set boundaries in order to achieve balance in  their lives: 4.  Patient will demonstrate ability to communicate their needs and set boundaries  through discussion and/or role plays  Summary of Patient Progress:  Tyjah was present throughout the session and proved open to feedback from [place] and peers. Patient demonstrated minimal insight into the subject matter, was respectful of peers, and was present throughout the entire session.  Therapeutic Modalities:   Cognitive Behavioral Therapy Solution-Focused Therapy  Windle Guard, LCSW 02/15/2022  1:27  PM

## 2022-02-15 NOTE — Group Note (Signed)
LCSW Group Therapy Note   Group Date: 02/15/2022 Start Time: 1100 End Time: 1200  Type of Therapy: Group Therapy: Boundaries   Participation Level: Active   Description of Group: This group will address the use of boundaries in their personal lives. Patients will explore why boundaries are important, the difference between healthy and unhealthy boundaries, and negative and positive outcomes of different boundaries and will look at how boundaries can be crossed.  Patients will be encouraged to identify current boundaries in their own lives and identify what kind of boundary is being set. Facilitators will guide patients in utilizing problem-solving interventions to address and correct types of boundaries being used and to address when no boundary is being used. Understanding and applying boundaries will be explored and addressed for obtaining and maintaining a balanced life. Patients will be encouraged to explore ways to assertively make their boundaries and needs known to significant others in their lives, using other group members and facilitator for role play, support, and feedback.    Therapeutic Goals: 1. Patient will identify areas in their life where setting clear boundaries could be used to improve their life.  2. Patient will identify signs/triggers that a boundary is not being respected. 3. Patient will identify two ways to set boundaries in order to achieve balance in their lives: 4. Patient will demonstrate ability to communicate their needs and set boundaries through discussion and/or role plays   Summary of Progress/Problems: The Pt attended group and remained there the entire time.  The Pt accepted all worksheets and materials that were provided.  The Pt was appropriate with peers and staff.  The Pt was able to identify values that are important to them and ways they can begin to create boundaries around those values.   Darleen Crocker, LCSWA 02/15/2022  1:06 PM

## 2022-02-16 NOTE — Progress Notes (Signed)
   02/15/22 2300  Psych Admission Type (Psych Patients Only)  Admission Status Voluntary  Psychosocial Assessment  Patient Complaints Anxiety  Eye Contact Fair  Facial Expression Flat  Affect Depressed  Speech Logical/coherent  Interaction Assertive  Motor Activity Slow  Appearance/Hygiene Unremarkable  Behavior Characteristics Cooperative;Appropriate to situation  Mood Depressed;Anxious  Thought Process  Coherency WDL  Content WDL  Delusions None reported or observed  Perception WDL  Hallucination None reported or observed  Judgment Poor  Confusion None  Danger to Self  Current suicidal ideation? Denies  Agreement Not to Harm Self Yes  Description of Agreement verbal  Danger to Others  Danger to Others None reported or observed

## 2022-02-16 NOTE — Group Note (Signed)
Recreation Therapy Group Note   Group Topic:Stress Management  Group Date: 02/16/2022 Start Time: 0935 End Time: 0950 Facilitators: Rika Daughdrill-McCall, LRT,CTRS Location: [address] Dayroom   Goal Area(s) Addresses:  Patient will actively participate in stress management techniques presented during session.  Patient will successfully identify benefit of practicing stress management post d/c.   Group Description: Guided Imagery. LRT provided education, instruction, and demonstration on practice of visualization via guided imagery. Patient was asked to participate in the technique introduced during session. LRT debriefed including topics of mindfulness, stress management and specific scenarios each patient could use these techniques. Patients were given suggestions of ways to access scripts post d/c and encouraged to explore Youtube and other apps available on smartphones, tablets, and computers.   Affect/Mood: Appropriate   Participation Level: Active   Participation Quality: Independent   Behavior: Appropriate   Speech/Thought Process: Focused   Insight: Good   Judgement: Good   Modes of Intervention: Script, Ambient Music   Patient Response to Interventions:  Attentive   Education Outcome:  Acknowledges education and In group clarification offered    Clinical Observations/Individualized Feedback: Pt attended and participated in group session.     Plan: Continue to engage patient in RT group sessions 2-3x/week.   Emrey Thornley-McCall, LRT,CTRS 02/16/2022 12:24 PM

## 2022-02-16 NOTE — [facility] Group Notes (Signed)
Adult Psychoeducational Group Note  Date:  02/16/2022 Time:  11:02 AM  Group Topic/Focus:  Goals Group:   The focus of this group is to help patients establish daily goals to achieve during treatment and discuss how the patient can incorporate goal setting into their daily lives to aide in recovery.  Participation Level:  Active  Participation Quality:  Appropriate  Affect:  Appropriate  Cognitive:  Appropriate  Insight: Appropriate  Engagement in Group:  Engaged  Modes of Intervention:  Discussion  Additional Comments:  Patient goal of the day is to live one day at a time and focus on me.   Jhayden Demuro R Alnisa Hasley 02/16/2022, 11:02 AM

## 2022-02-16 NOTE — Progress Notes (Signed)
[facility] MD Progress Note  02/16/2022 8:57 AM Margaret Blackburn  MRN:  [id number]  Reason for admission: Caucasian female with hx of major depressive disorder & generalized anxiety disorder. Admitted to the [facility] from the [facility] ED with complaint of intentional overdose on multiple alprazolam tablets in a suicide attempt.   Daily note: Margaret Blackburn is seen, chart reviewed. The chart findings discussed with the treatment team. She presents alert, oriented & aware of situation. She is visible on the unit, attending group sessions. She presents with an improved affect, verbally response. She is making a good eye contact. She reports, "I feel really good & positive today. My depression is low today. I now have a different out look on life. I have learned about setting boundaries & I'm going to apply it in my life & household moving forward. The medicines are helping, no side effects". Margaret Blackburn currently denies any SIHI, AVH, delusional thoughts or paranoia. She does not appear to be responding to any internal stimuli. There are no changes made on her current plan of care. Will continue as already in progress. If patient maintains her mood stability up till tomorrow, will be discharged. Her vital signs remain stable. No new labs ordered.  Principal Problem: Major depressive disorder, recurrent severe without psychotic features ([place])  Diagnosis: Principal Problem:   Major depressive disorder, recurrent severe without psychotic features ([place])  Total Time spent with patient:  35 minutes  Past Psychiatric History: See H&P.  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   BREAST LUMPECTOMY WITH NEEDLE LOCALIZATION Right 03/03/2018   Procedure: BREAST LUMPECTOMY WITH NEEDLE LOCALIZATION;  Surgeon: Vickie Epley, MD;  Location: [facility] ORS;  Service: General;  Laterality: Right;   COLONOSCOPY WITH PROPOFOL N/A 08/31/2019   Procedure: COLONOSCOPY WITH PROPOFOL;  Surgeon: Jonathon Bellows, MD;  Location: [facility] ENDOSCOPY;  Service: Gastroenterology;  Laterality: N/A;   SENTINEL NODE BIOPSY Right 03/21/2018   Procedure: RIGHT SENTINEL LYMPH  NODE BIOPSY;  Surgeon: Vickie Epley, MD;  Location: [facility] ORS;  Service: General;  Laterality: Right;   Family History:  Family History  Problem Relation Age of Onset   Breast cancer Mother 72   Lung cancer Mother    Breast cancer Maternal Grandmother    Thyroid cancer Father    Family Psychiatric  History: See H&P  Social History:  Social History   Substance and Sexual Activity  Alcohol Use No     Social History   Substance and Sexual Activity  Drug Use Yes   Types: Marijuana   Comment: 1 joint daily    Social History   Socioeconomic History   Marital status: Single    Spouse name: Not on file   Number of children: 2   Years of education: Not on file   Highest education level: Not on file  Occupational History  Not on file  Tobacco Use   Smoking status: Every Day    Packs/day: 1.50    Years: 42.00    Total pack years: 63.00    Types: Cigarettes, E-cigarettes   Smokeless tobacco: Never   Tobacco comments:    Smokes a pack and a half daily  Vaping Use   Vaping Use: Former   Devices: used  2 weeks per pt  Substance and Sexual Activity   Alcohol use: No   Drug use: Yes    Types: Marijuana    Comment: 1 joint daily   Sexual activity: Not Currently  Other Topics Concern   Not on file  Social History Narrative   Not on file   Social  Determinants of Health   Financial Resource Strain: Not on file  Food Insecurity: No Food Insecurity (02/13/2022)   Hunger Vital Sign    Worried About Running Out of Food in the Last Year: Never true    Ran Out of Food in the Last Year: Never true  Transportation Needs: No Transportation Needs (02/13/2022)   PRAPARE - Hydrologist (Medical): No    Lack of Transportation (Non-Medical): No  Physical Activity: Not on file  Stress: Not on file  Social Connections: Not on file   Additional Social History:   Sleep: Good  Appetite:  Good  Current Medications: Current Facility-Administered Medications  Medication Dose Route Frequency Provider Last Rate Last Admin   acetaminophen (TYLENOL) tablet 650 mg  650 mg Oral Q6H PRN Onuoha, Josephine C, NP       albuterol (VENTOLIN HFA) 108 (90 Base) MCG/ACT inhaler 2 puff  2 puff Inhalation Q6H PRN Cleatrice Burke, Josephine C, NP       alum & mag hydroxide-simeth (MAALOX/MYLANTA) 200-200-20 MG/5ML suspension 30 mL  30 mL Oral Q4H PRN Cleatrice Burke, Josephine C, NP       ARIPiprazole (ABILIFY) tablet 2 mg  2 mg Oral Daily Emilija Bohman I, NP   2 mg at 02/16/22 0801   DULoxetine (CYMBALTA) DR capsule 60 mg  60 mg Oral Daily Twania Bujak, Herbert Pun I, NP   60 mg at 02/16/22 0801   hydrOXYzine (ATARAX) tablet 25 mg  25 mg Oral TID PRN Dian Situ, MD   25 mg at 02/15/22 1636   magnesium hydroxide (MILK OF MAGNESIA) suspension 30 mL  30 mL Oral Daily PRN Charmaine Downs C, NP       metFORMIN (GLUCOPHAGE) tablet 500 mg  500 mg Oral BID WC Onuoha, Josephine C, NP   500 mg at 02/16/22 0800   montelukast (SINGULAIR) tablet 10 mg  10 mg Oral QHS Onuoha, Josephine C, NP   10 mg at 02/15/22 2058   nicotine (NICODERM CQ - dosed in mg/24 hours) patch 21 mg  21 mg Transdermal Once Onuoha, Josephine C, NP       nicotine (NICODERM CQ - dosed in mg/24 hours) patch 21 mg  21 mg Transdermal Daily Massengill, Nathan, MD   21 mg at 02/16/22 0801   pregabalin (LYRICA)  capsule 150 mg  150 mg Oral BID Onuoha, Josephine C, NP   150 mg at 02/16/22 0800   rosuvastatin (CRESTOR) tablet 5 mg  5 mg Oral Daily Onuoha, Josephine C, NP   5 mg at 02/16/22 0800   traZODone (DESYREL) tablet 50 mg  50 mg Oral QHS PRN Lindell Spar I, NP   50 mg at 02/15/22 2058   Lab Results:  Results for orders placed or  performed during the hospital encounter of 02/13/22 (from the past 48 hour(s))  Hemoglobin A1c     Status: Abnormal   Collection Time: 02/15/22  6:28 AM  Result Value Ref Range   Hgb A1c MFr Bld 5.8 (H) 4.8 - 5.6 %    Comment: (NOTE) Pre diabetes:          5.7%-6.4%  Diabetes:              >6.4%  Glycemic control for   <7.0% adults with diabetes    Mean Plasma Glucose 119.76 mg/dL    Comment: Performed at [facility] [address], [place], [place] [address]   Blood Alcohol level:  Lab Results  Component Value Date   ETH <10 70/26/3785   Metabolic Disorder Labs: Lab Results  Component Value Date   HGBA1C 5.8 (H) 02/15/2022   MPG 119.76 02/15/2022   No results found for: "PROLACTIN" Lab Results  Component Value Date   CHOL 154 12/05/2021   TRIG 94 12/05/2021   HDL 51 12/05/2021   CHOLHDL 3.0 12/05/2021   LDLCALC 86 12/05/2021   LDLCALC 113 (H) 03/22/2020   Physical Findings: AIMS:  , ,  ,  ,    CIWA:    COWS:     Musculoskeletal: Strength & Muscle Tone: within normal limits Gait & Station: normal Patient leans: N/A  Psychiatric Specialty Exam:  Presentation  General Appearance:  Casual; Disheveled; Fairly Groomed  Eye Contact: Good  Speech: Clear and Coherent; Normal Rate  Speech Volume: Normal  Handedness: Right   Mood and Affect  Mood: Depressed; Anxious  Affect: Non-Congruent  Thought Process  Thought Processes: Coherent; Linear  Descriptions of Associations:Intact  Orientation:Full (Time, Place and Person)  Thought Content:Logical  History of Schizophrenia/Schizoaffective disorder: NA  Duration  of Psychotic Symptoms: NA  Hallucinations:No data recorded  Ideas of Reference:None  Suicidal Thoughts:No data recorded  Homicidal Thoughts:No data recorded   Sensorium  Memory: Immediate Good; Recent Good; Remote Good  Judgment: Good  Insight: Fair   Community education officer  Concentration: Good  Attention Span: Good  Recall: Good  Fund of Knowledge: Good  Language: Good  Psychomotor Activity  Psychomotor Activity: No data recorded  Assets  Assets: Communication Skills; Desire for Improvement; Housing; Physical Health; Resilience; Social Support   Sleep  Sleep: No data recorded  Physical Exam: Physical Exam Vitals and nursing note reviewed.  HENT:     Head: Normocephalic.     Nose: Nose normal.     Mouth/Throat:     Pharynx: Oropharynx is clear.  Eyes:     Pupils: Pupils are equal, round, and reactive to light.  Cardiovascular:     Rate and Rhythm: Normal rate.  Pulmonary:     Effort: Pulmonary effort is normal.  Genitourinary:    Comments: Deferred Musculoskeletal:        General: Normal range of motion.     Cervical back: Normal range of motion.  Skin:    General: Skin is warm and dry.  Neurological:     General: No focal deficit present.     Mental Status: She is alert and oriented to person, place, and time.    Review of Systems  Constitutional:  Negative for chills, diaphoresis and fever.  HENT:  Negative for sinus pain and sore throat.   Eyes:  Negative for blurred vision.  Respiratory:  Negative for cough, shortness of breath and wheezing.   Cardiovascular:  Negative for chest pain and palpitations.  Gastrointestinal:  Negative for abdominal pain, constipation, diarrhea, heartburn, nausea and vomiting.  Genitourinary:  Negative for dysuria.  Musculoskeletal:  Negative for joint pain and myalgias.  Skin:  Negative for itching and rash.  Neurological:  Negative for dizziness, tingling, tremors, sensory change, speech change,  focal weakness, seizures, loss of consciousness, weakness and headaches.  Endo/Heme/Allergies:        Allergies: PCN  Psychiatric/Behavioral:  Positive for depression and substance abuse (UDS (+) for benzodiazepine & THC.). Negative for hallucinations, memory loss and suicidal ideas. The patient is nervous/anxious. The patient does not have insomnia.    Blood pressure 106/83, pulse 98, temperature (!) 97.5 F (36.4 C), temperature source Oral, resp. rate 14, height '5\' 2"'$  (1.575 m), weight 88.5 kg, SpO2 98 %. Body mass index is 35.67 kg/m.  Treatment Plan Summary: Daily contact with patient to assess and evaluate symptoms and progress in treatment and Medication management.   Continue inpatient hospitalization.  Will continue today 02/16/2022 plan as below except where it is noted.   Principal/active diagnoses. Major depressive disorder, recurrent severe without psychotic features ([place])  Plan: -Continue Cymbalta 60 mg po daily for depression. -Continue Abilify 2 mg po daily to augment Cymbalta for tx of depression. -Continue Hydroxyzine 25 mg po tid prn for anxiety.  -Continue Trazodone 50 mg po Q hs prn for insomnia.  -Continue Nicotine patch 21 mg topically Q 24 hours for smoking cessation.   Other medical issues.  -Continue Metformin 500 mg po bid for DM.  -Continue Lyrica 150 mg po bid for pain management.  -Continue Crestor 5 mg po Q daily for hyperlipidemia. -Continue Singulair 10 mg po for COPD.  Labs: Will obtain Hgba1c: Result reviewed, 5.8.   Other PRNS -Continue Tylenol 650 mg every 6 hours PRN for mild pain -Continue Maalox 30 ml Q 4 hrs PRN for indigestion -Continue MOM 30 ml po Q 6 hrs for constipation   Safety and Monitoring: Voluntary admission to inpatient psychiatric unit for safety, stabilization and treatment Daily contact with patient to assess and evaluate symptoms and progress in treatment Patient's case to be discussed in multi-disciplinary team  meeting Observation Level : q15 minute checks Vital signs: q12 hours Precautions: Safety   Discharge Planning: Social work and case management to assist with discharge planning and identification of hospital follow-up needs prior to discharge Estimated LOS: 5-7 days Discharge Concerns: Need to establish a safety plan; Medication compliance and effectiveness Discharge Goals: Return home with outpatient referrals for mental health follow-up including medication management/psychotherapy  Lindell Spar, NP, pmhnp, fnp-bc. 02/16/2022, 8:57 AMPatient ID: Sander Nephew, female   DOB: 1965-05-13, 84 y.o.   MRN: [id number]

## 2022-02-16 NOTE — Plan of Care (Signed)
  Problem: Coping: Goal: Ability to verbalize frustrations and anger appropriately will improve Outcome: Progressing Goal: Ability to demonstrate self-control will improve Outcome: Progressing   Problem: Safety: Goal: Periods of time without injury will increase Outcome: Progressing

## 2022-02-17 ENCOUNTER — Encounter (HOSPITAL_COMMUNITY): Payer: Self-pay | Admitting: Nurse Practitioner

## 2022-02-17 DIAGNOSIS — F332 Major depressive disorder, recurrent severe without psychotic features: Principal | ICD-10-CM

## 2022-02-17 NOTE — Progress Notes (Signed)
[facility] MD Progress Note  02/17/2022 2:25 PM EARLE BURSON  MRN:  [id number]  Reason for admission: Caucasian female with hx of major depressive disorder & generalized anxiety disorder. Admitted to the [facility] from the [facility] ED with complaint of intentional overdose on multiple alprazolam tablets in a suicide attempt.   Daily notes: Margaret Blackburn is seen, chart reviewed. The chart findings discussed with the treatment team. She presents alert, oriented & aware of situation. She is visible on the unit, attending group sessions. She presents with an improved affect, verbally response. She is making a good eye contact.She reports, "I feel really good today. I know now that I can say, I feel comfortable going home when ever I get discharged. I'm doing well on the medicines. The group sessions are informative. I have learned a lot of coping skills to apply to my life. I slept well last night".   Objective: Mckinzy currently denies any SIHI, AVH, delusional thoughts or paranoia. She does not appear to be responding to any internal stimuli. Patient may be approaching her baseline. If she continues to do well mentally by tomorrow, she will be discharged. There are no changes made on her current plan of care below. Will continue as already in progress. Vital signs remain stable.  Principal Problem: Major depressive disorder, recurrent severe without psychotic features ([place])  Diagnosis: Principal Problem:   Major depressive disorder, recurrent severe without psychotic features ([place])  Total Time spent with patient:  35 minutes  Past Psychiatric History: See H&P.  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   BREAST LUMPECTOMY WITH NEEDLE LOCALIZATION Right 03/03/2018   Procedure: BREAST LUMPECTOMY WITH NEEDLE LOCALIZATION;  Surgeon: Vickie Epley, MD;  Location: [facility] ORS;  Service: General;  Laterality: Right;   COLONOSCOPY WITH PROPOFOL N/A 08/31/2019   Procedure: COLONOSCOPY WITH PROPOFOL;  Surgeon: Jonathon Bellows, MD;  Location: [facility] ENDOSCOPY;  Service: Gastroenterology;  Laterality: N/A;   SENTINEL NODE BIOPSY Right 03/21/2018   Procedure: RIGHT SENTINEL LYMPH  NODE BIOPSY;  Surgeon: Vickie Epley, MD;  Location: [facility] ORS;  Service: General;  Laterality: Right;   Family History:  Family History  Problem Relation Age of Onset   Breast cancer Mother 22   Lung cancer Mother    Breast cancer Maternal Grandmother    Thyroid cancer Father    Family Psychiatric  History: See H&P  Social History:  Social History   Substance and Sexual Activity  Alcohol Use No     Social History   Substance and Sexual Activity  Drug Use Yes   Types: Marijuana   Comment: 1 joint daily    Social History   Socioeconomic History   Marital status: Single    Spouse name: Not on file   Number of children: 2   Years of education: Not on file   Highest  education level: Not on file  Occupational History   Not on file  Tobacco Use   Smoking status: Every Day    Packs/day: 1.50    Years: 42.00    Total pack years: 63.00    Types: Cigarettes, E-cigarettes   Smokeless tobacco: Never   Tobacco comments:    Smokes a pack and a half daily  Vaping Use   Vaping Use: Former   Devices: used  2 weeks per pt  Substance and Sexual Activity   Alcohol use: No   Drug use: Yes    Types: Marijuana    Comment: 1 joint daily   Sexual activity: Not Currently  Other Topics Concern   Not on file   Social History Narrative   Not on file   Social Determinants of Health   Financial Resource Strain: Not on file  Food Insecurity: No Food Insecurity (02/13/2022)   Hunger Vital Sign    Worried About Running Out of Food in the Last Year: Never true    Ran Out of Food in the Last Year: Never true  Transportation Needs: No Transportation Needs (02/13/2022)   PRAPARE - Hydrologist (Medical): No    Lack of Transportation (Non-Medical): No  Physical Activity: Not on file  Stress: Not on file  Social Connections: Not on file   Additional Social History:   Sleep: Good  Appetite:  Good  Current Medications: Current Facility-Administered Medications  Medication Dose Route Frequency Provider Last Rate Last Admin   acetaminophen (TYLENOL) tablet 650 mg  650 mg Oral Q6H PRN Charmaine Downs C, NP   650 mg at 02/17/22 0804   albuterol (VENTOLIN HFA) 108 (90 Base) MCG/ACT inhaler 2 puff  2 puff Inhalation Q6H PRN Charmaine Downs C, NP       alum & mag hydroxide-simeth (MAALOX/MYLANTA) 200-200-20 MG/5ML suspension 30 mL  30 mL Oral Q4H PRN Cleatrice Burke, Josephine C, NP       ARIPiprazole (ABILIFY) tablet 2 mg  2 mg Oral Daily Letrice Pollok I, NP   2 mg at 02/17/22 0802   DULoxetine (CYMBALTA) DR capsule 60 mg  60 mg Oral Daily Tramain Gershman, Herbert Pun I, NP   60 mg at 02/17/22 0802   hydrOXYzine (ATARAX) tablet 25 mg  25 mg Oral TID PRN Dian Situ, MD   25 mg at 02/17/22 0804   magnesium hydroxide (MILK OF MAGNESIA) suspension 30 mL  30 mL Oral Daily PRN Charmaine Downs C, NP       metFORMIN (GLUCOPHAGE) tablet 500 mg  500 mg Oral BID WC Onuoha, Josephine C, NP   500 mg at 02/17/22 0802   montelukast (SINGULAIR) tablet 10 mg  10 mg Oral QHS Onuoha, Josephine C, NP   10 mg at 02/16/22 2201   nicotine (NICODERM CQ - dosed in mg/24 hours) patch 21 mg  21 mg Transdermal Once Onuoha, Josephine C, NP       nicotine (NICODERM CQ - dosed in mg/24 hours) patch 21 mg  21 mg Transdermal  Daily Massengill, Nathan, MD   21 mg at 02/17/22 1013   pregabalin (LYRICA) capsule 150 mg  150 mg Oral BID Charmaine Downs C, NP   150 mg at 02/17/22 0802   rosuvastatin (CRESTOR) tablet 5 mg  5 mg Oral Daily Onuoha, Josephine C, NP   5 mg at 02/16/22 0800   traZODone (DESYREL) tablet 50 mg  50 mg Oral QHS PRN Lindell Spar I, NP   50 mg  at 02/16/22 2201   Lab Results:  No results found for this or any previous visit (from the past 48 hour(s)).  Blood Alcohol level:  Lab Results  Component Value Date   ETH <10 01/60/1093   Metabolic Disorder Labs: Lab Results  Component Value Date   HGBA1C 5.8 (H) 02/15/2022   MPG 119.76 02/15/2022   No results found for: "PROLACTIN" Lab Results  Component Value Date   CHOL 154 12/05/2021   TRIG 94 12/05/2021   HDL 51 12/05/2021   CHOLHDL 3.0 12/05/2021   LDLCALC 86 12/05/2021   LDLCALC 113 (H) 03/22/2020   Physical Findings: AIMS:  , ,  ,  ,    CIWA:    COWS:     Musculoskeletal: Strength & Muscle Tone: within normal limits Gait & Station: normal Patient leans: N/A  Psychiatric Specialty Exam:  Presentation  General Appearance:  Casual; Disheveled; Fairly Groomed  Eye Contact: Good  Speech: Clear and Coherent; Normal Rate  Speech Volume: Normal  Handedness: Right   Mood and Affect  Mood: Depressed; Anxious  Affect: Non-Congruent  Thought Process  Thought Processes: Coherent; Linear  Descriptions of Associations:Intact  Orientation:Full (Time, Place and Person)  Thought Content:Logical  History of Schizophrenia/Schizoaffective disorder: NA  Duration of Psychotic Symptoms: NA  Hallucinations:No data recorded  Ideas of Reference:None  Suicidal Thoughts:No data recorded  Homicidal Thoughts:No data recorded   Sensorium  Memory: Immediate Good; Recent Good; Remote Good  Judgment: Good  Insight: Fair   Community education officer  Concentration: Good  Attention  Span: Good  Recall: Good  Fund of Knowledge: Good  Language: Good  Psychomotor Activity  Psychomotor Activity: No data recorded  Assets  Assets: Communication Skills; Desire for Improvement; Housing; Physical Health; Resilience; Social Support   Sleep  Sleep: No data recorded  Physical Exam: Physical Exam Vitals and nursing note reviewed.  HENT:     Head: Normocephalic.     Nose: Nose normal.     Mouth/Throat:     Pharynx: Oropharynx is clear.  Eyes:     Pupils: Pupils are equal, round, and reactive to light.  Cardiovascular:     Rate and Rhythm: Normal rate.  Pulmonary:     Effort: Pulmonary effort is normal.  Genitourinary:    Comments: Deferred Musculoskeletal:        General: Normal range of motion.     Cervical back: Normal range of motion.  Skin:    General: Skin is warm and dry.  Neurological:     General: No focal deficit present.     Mental Status: She is alert and oriented to person, place, and time.    Review of Systems  Constitutional:  Negative for chills, diaphoresis and fever.  HENT:  Negative for sinus pain and sore throat.   Eyes:  Negative for blurred vision.  Respiratory:  Negative for cough, shortness of breath and wheezing.   Cardiovascular:  Negative for chest pain and palpitations.  Gastrointestinal:  Negative for abdominal pain, constipation, diarrhea, heartburn, nausea and vomiting.  Genitourinary:  Negative for dysuria.  Musculoskeletal:  Negative for joint pain and myalgias.  Skin:  Negative for itching and rash.  Neurological:  Negative for dizziness, tingling, tremors, sensory change, speech change, focal weakness, seizures, loss of consciousness, weakness and headaches.  Endo/Heme/Allergies:        Allergies: PCN  Psychiatric/Behavioral:  Positive for depression and substance abuse (UDS (+) for benzodiazepine & THC.). Negative for hallucinations, memory loss and suicidal ideas. The patient  is nervous/anxious. The patient  does not have insomnia.    Blood pressure 119/74, pulse 83, temperature 97.8 F (36.6 C), temperature source Oral, resp. rate 16, height '5\' 2"'$  (1.575 m), weight 88.5 kg, SpO2 99 %. Body mass index is 35.67 kg/m.  Treatment Plan Summary: Daily contact with patient to assess and evaluate symptoms and progress in treatment and Medication management.   Continue inpatient hospitalization.  Will continue today 02/17/2022 plan as below except where it is noted.   Principal/active diagnoses. Major depressive disorder, recurrent severe without psychotic features ([place])  Plan: -Continue Cymbalta 60 mg po daily for depression. -Continue Abilify 2 mg po daily to augment Cymbalta for tx of depression. -Continue Hydroxyzine 25 mg po tid prn for anxiety.  -Continue Trazodone 50 mg po Q hs prn for insomnia.  -Continue Nicotine patch 21 mg topically Q 24 hours for smoking cessation.   Other medical issues.  -Continue Metformin 500 mg po bid for DM.  -Continue Lyrica 150 mg po bid for pain management.  -Continue Crestor 5 mg po Q daily for hyperlipidemia. -Continue Singulair 10 mg po for COPD.  Labs: Will obtain Hgba1c: Result reviewed, 5.8.   Other PRNS -Continue Tylenol 650 mg every 6 hours PRN for mild pain -Continue Maalox 30 ml Q 4 hrs PRN for indigestion -Continue MOM 30 ml po Q 6 hrs for constipation   Safety and Monitoring: Voluntary admission to inpatient psychiatric unit for safety, stabilization and treatment Daily contact with patient to assess and evaluate symptoms and progress in treatment Patient's case to be discussed in multi-disciplinary team meeting Observation Level : q15 minute checks Vital signs: q12 hours Precautions: Safety   Discharge Planning: Social work and case management to assist with discharge planning and identification of hospital follow-up needs prior to discharge Estimated LOS: 5-7 days Discharge Concerns: Need to establish a safety plan; Medication  compliance and effectiveness Discharge Goals: Return home with outpatient referrals for mental health follow-up including medication management/psychotherapy  Lindell Spar, NP, pmhnp, fnp-bc. 02/17/2022, 2:25 PMPatient ID: Sander Nephew, female   DOB: 09-21-1965, 9 y.o.   MRN: [id number] Patient ID: ANGINETTE ESPEJO, female   DOB: 08-31-1965, 57 y.o.   MRN: [id number]

## 2022-02-17 NOTE — Plan of Care (Signed)
  Problem: Education: Goal: Emotional status will improve Outcome: Progressing Goal: Mental status will improve Outcome: Progressing   Problem: Activity: Goal: Sleeping patterns will improve Outcome: Progressing   Problem: Safety: Goal: Periods of time without injury will increase Outcome: Progressing   Problem: Coping: Goal: Coping ability will improve Outcome: Progressing

## 2022-02-17 NOTE — B[facility] Group Notes (Signed)
.  Psychoeducational Group Note    Date:  02/17/2022 Time:1300-1400    Purpose of Group: . The group focus' on teaching patients on how to identify their needs and their Life Skills:  A group where two lists are made. What people need and what are things that we do that are unhealthy. The lists are developed by the patients and it is explained that we often do the actions that are not healthy to get our list of needs met.  Goal:: to develop the coping skills needed to get their needs met  Participation Level:  Active  Participation Quality:  Appropriate  Affect:  Appropriate  Cognitive:  Oriented  Insight: Improving  Engagement in Group:  Engaged  Modes of Intervention:  Activity, Discussion, Education, and Support  Additional Comments:  Rates energy at an 8/10  [place], [place]

## 2022-02-17 NOTE — Progress Notes (Signed)
   02/17/22 2208  Psych Admission Type (Psych Patients Only)  Admission Status Voluntary  Psychosocial Assessment  Patient Complaints None  Eye Contact Fair  Facial Expression Animated  Affect Appropriate to circumstance  Speech Logical/coherent  Interaction Assertive  Motor Activity Other (Comment) (WDL)  Appearance/Hygiene Unremarkable  Behavior Characteristics Appropriate to situation  Mood Anxious;Pleasant  Thought Process  Coherency WDL  Content WDL  Delusions None reported or observed  Perception WDL  Hallucination None reported or observed  Judgment WDL  Confusion None  Danger to Self  Current suicidal ideation? Denies  Agreement Not to Harm Self Yes  Description of Agreement verbal  Danger to Others  Danger to Others None reported or observed

## 2022-02-17 NOTE — Progress Notes (Signed)
   02/17/22 0615  15 Minute Checks  Location Bedroom  Visual Appearance Calm  Behavior Composed  Sleep (Behavioral Health Patients Only)  Calculate sleep? (Click Yes once per 24 hr at 0600 safety check) Yes  Documented sleep last 24 hours 7

## 2022-02-17 NOTE — Progress Notes (Signed)
   Patient alert and oriented x 4, cooperative with care. Patient denies SI,HI, & AVH. Patient stated "I had a really good day today, I feel like I have learnt a lot from other here." She said her depression is "not anywhere near what I came with, rated depression 2/10, and anxiety zero. Patient educated on medications and safety on the unit, support and encouragement provided. Scheduled medication along with PRN Trazodone administered per [facility]. No adverse drug reactions reported. Patient sleeping with  her CPAP at this time, no physical symptoms/ distress noted. Safety maintained at Q 15 minutes interval, patient verbally contracts for safety, patient remains safe in room and on the unit.

## 2022-02-17 NOTE — Progress Notes (Addendum)
D. Pt presents with an anxious affect/ mood- has been pleasant and friendly upon approach. Per pt's self inventory, pt rated her depression,hopelessness and anxiety a 3/0/9, respectively. Pt stated that she was "ready to go home and take care of me", and that she had plenty of support to help her do that. Pt's stated goal was to work on "being positive", by being around those that are positive. Pt has been visible in the milieu, observed interacting well with peers and attending groups. Pt currently denies SI/HI and AVH A. Labs and vitals monitored. Pt given and educated on medications. Pt supported emotionally and encouraged to express concerns and ask questions.   R. Pt remains safe with 15 minute checks. Will continue POC.

## 2022-02-18 MED ORDER — NICOTINE 21 MG/24HR TD PT24: 21.0000 mg | MEDICATED_PATCH | Freq: Every day | TRANSDERMAL | 0 refills | Status: AC

## 2022-02-18 MED ORDER — DULOXETINE HCL 60 MG PO CPEP: 60.0000 mg | ORAL_CAPSULE | Freq: Every day | ORAL | 0 refills | Status: AC

## 2022-02-18 MED ORDER — TRAZODONE HCL 50 MG PO TABS: 50.0000 mg | ORAL_TABLET | Freq: Every evening | ORAL | 0 refills | Status: AC | PRN

## 2022-02-18 MED ORDER — HYDROXYZINE HCL 25 MG PO TABS: 25.0000 mg | ORAL_TABLET | Freq: Three times a day (TID) | ORAL | 0 refills | Status: AC | PRN

## 2022-02-18 MED ORDER — ARIPIPRAZOLE 2 MG PO TABS: 2.0000 mg | ORAL_TABLET | Freq: Every day | ORAL | 0 refills | Status: AC

## 2022-02-18 NOTE — Progress Notes (Signed)
Pt discharged to lobby. Pt was stable and appreciative at that time. All papers, samples, and prescriptions were given and valuables returned. Suicide safety plan completed, and copy put in chart. Verbal understanding expressed. Denies SI/HI and A/VH. Pt given opportunity to express concerns and ask questions.

## 2022-02-18 NOTE — Group Note (Signed)
LCSW Group Therapy Note   Group Date: 02/18/2022 Start Time: 1000 End Time: 1100  Type of Therapy and Topic:  Group Therapy: Are You Under Stress?!?!  Participation Level:  Active  Description of Group: This process group involved patients examining stress and how it impacts their lives. Distress and eustress definitions were explained and patients identified both good and bad stressors in their lives. Accompanying worksheet was used to help patients identify the physical and emotional symptoms of stress and techniques/copings skills they utilize to help reduce these symptoms.    Therapeutic Goals:  1.  Patients will talk about their experiences with distress and eustress. 2. Patients will identify stressors in their lives and the physical and emotional  symptoms they experience while under stress. 3. Patients to identify actions/coping skills that they utilize to help ameliorate  symptoms of stress.  Summary of Patient Progress:  Patient was present and active in group. Patient accepted and followed along with provided worksheet. Patient shared positive insight on what she considered to be her self-care. Patient shared examples of self-care aspects that she needed to work on. Patient remain in group the entire time.    Therapeutic Modalities:  Cognitive Behavioral Therapy Solution-Focused Therapy   Sherre Lain, LCSWA 02/18/2022  2:02 PM

## 2022-02-18 NOTE — Progress Notes (Signed)
  [facility] Adult Case Management Discharge Plan :  Will you be returning to the same living situation after discharge:  Yes,  with family At discharge, do you have transportation home?: Yes,  family Do you have the ability to pay for your medications: Yes,  has [employer] insurance  Release of information consent forms completed and in the chart;  Patient's signature needed at discharge.  Patient to Follow up at:  [facility]. Go on 03/29/2022.   Specialty: Behavioral Health Why: You have an appointment on 03/29/22 at 1:00 pm with Dr. Corky Sox for medication management services. (please call to cancel if you do not wish to keep this appt)  At this time, they will refer you to the chemical/substance dependency program for intensive therapy services. * If you need to be seen sooner, you may also go to this provider for an assessment, to obtain medication management services: Monday through Friday, Arrive no later than 7:20 am. Services are provided on a first come, first served basis. Contact information: [facility] Follow up on 02/22/2022.   Why: You have an appointment for therapy services on 02/22/22 at 10:00 am.   This will be a Virtual appointment. Contact information: [address] [place], [place] [address] [phone]         [facility] Follow up on 03/09/2022.   Why: You have an appointment for medication management services on 03/09/22 at 4:00 pm. This appointment will be Virtual. Contact information: [address] [place] [address] [phone]                 Next level of care provider has access to [place] and Suicide Prevention discussed: Yes,  brother  Pershing Proud     Has patient been referred to the Quitline?: Patient refused  referral  Patient has been referred for addiction treatment: Pt. refused referral  Lubertha South, LCSW 02/18/2022, 10:19 AM

## 2022-02-18 NOTE — Progress Notes (Signed)
   02/18/22 1000  Psych Admission Type (Psych Patients Only)  Admission Status Voluntary  Psychosocial Assessment  Patient Complaints None  Eye Contact Fair  Facial Expression Animated  Affect Appropriate to circumstance  Speech Logical/coherent  Interaction Assertive  Motor Activity Other (Comment) (steady gait)  Appearance/Hygiene Unremarkable  Behavior Characteristics Cooperative;Appropriate to situation  Mood Pleasant;Anxious  Thought Process  Coherency WDL  Content WDL  Delusions None reported or observed  Perception WDL  Hallucination None reported or observed  Judgment WDL  Confusion None  Danger to Self  Current suicidal ideation? Denies  Danger to Others  Danger to Others None reported or observed

## 2022-02-18 NOTE — BHH Suicide Risk Assessment (Signed)
Suicide Risk Assessment  Discharge Assessment    [facility] Discharge Suicide Risk Assessment   Principal Problem: Major depressive disorder, recurrent severe without psychotic features ([place])  Discharge Diagnoses: Principal Problem:   Major depressive disorder, recurrent severe without psychotic features ([place])  Total Time spent with patient:  Greater than 30 minutes  Musculoskeletal: Strength & Muscle Tone: within normal limits Gait & Station: normal Patient leans: N/A  Psychiatric Specialty Exam  Presentation  General Appearance:  Appropriate for Environment; Casual; Well Groomed  Eye Contact: Good  Speech: Clear and Coherent; Normal Rate  Speech Volume: Normal  Handedness: Right   Mood and Affect  Mood: Euthymic  Duration of Depression Symptoms: No data recorded Affect: Appropriate; Congruent   Thought Process  Thought Processes: Coherent; Goal Directed; Linear  Descriptions of Associations:Intact  Orientation:Full (Time, Place and Person)  Thought Content:Logical  History of Schizophrenia/Schizoaffective disorder:No data recorded Duration of Psychotic Symptoms:No data recorded Hallucinations:Hallucinations: None Description of Visual Hallucinations: NA  Ideas of Reference:None  Suicidal Thoughts:Suicidal Thoughts: No SI Active Intent and/or Plan: Without Intent; Without Plan; Without Means to Carry Out; Without Access to Means  Homicidal Thoughts:Homicidal Thoughts: No   Sensorium  Memory: Immediate Good; Remote Good; Recent Good  Judgment: Good  Insight: Good   Executive Functions  Concentration: Good  Attention Span: Good  Recall: Good  Fund of Knowledge: Good  Language: Good   Psychomotor Activity  Psychomotor Activity:Psychomotor Activity: Normal   Assets  Assets: Communication Skills; Desire for Improvement; Housing; Resilience; Physical Health; Social Support   Sleep  Sleep:Sleep: Good Number of Hours of  Sleep: 7.5  Physical Exam:  Blood pressure 128/86, pulse 100, temperature 97.9 F (36.6 C), temperature source Oral, resp. rate 20, height '5\' 2"'$  (1.575 m), weight 88.5 kg, SpO2 98 %. Body mass index is 35.67 kg/m.  Mental Status Per Nursing Assessment::   On Admission:  NA  Demographic Factors:  Adolescent or young adult, Caucasian, and Unemployed  Loss Factors: Decrease in vocational status and Financial problems/change in socioeconomic status  Historical Factors: Impulsivity  Risk Reduction Factors:   Responsible for children under 51 years of age, Sense of responsibility to family, Living with another person, especially a relative, Positive social support, Positive therapeutic relationship, and Positive coping skills or problem solving skills  Continued Clinical Symptoms:  Depression:   Impulsivity Alcohol/Substance Abuse/Dependencies Previous Psychiatric Diagnoses and Treatments Medical Diagnoses and Treatments/Surgeries  Cognitive Features That Contribute To Risk:  Closed-mindedness, Polarized thinking, and Thought constriction (tunnel vision)    Suicide Risk:  Minimal: No identifiable suicidal ideation.  Patients presenting with no risk factors but with morbid ruminations; may be classified as minimal risk based on the severity of the depressive symptoms   Follow-up [place]. Go on 03/29/2022.   Specialty: Behavioral Health Why: You have an appointment on 03/29/22 at 1:00 pm with Dr. Corky Sox for medication management services. (please call to cancel if you do not wish to keep this appt)  At this time, they will refer you to the chemical/substance dependency program for intensive therapy services. * If you need to be seen sooner, you may also go to this provider for an assessment, to obtain medication management services: Monday through Friday, Arrive no later than 7:20 am. Services are provided on a first come, first  served basis. Contact information: [place], [place] Follow  up on 02/22/2022.   Why: You have an appointment for therapy services on 02/22/22 at 10:00 am.   This will be a Virtual appointment. Contact information: [address] [place], [place] [address] [phone]         [facility] Follow up on 03/09/2022.   Why: You have an appointment for medication management services on 03/09/22 at 4:00 pm. This appointment will be Virtual. Contact information: [place] [address] [phone]                Plan Of Care/Follow-up recommendations:  See the discharge recommendations above.  Lindell Spar, NP, pmhnp, fnp-bc. 02/18/2022, 10:15 AM

## 2022-02-18 NOTE — BHH Group Notes (Signed)
Adult Psychoeducational Group Note Date:  02/18/2022 Time:  0900-1000 Group Topic/Focus: PROGRESSIVE RELAXATION. A group where deep breathing is taught and tensing and relaxation muscle groups is used. Imagery is used as well.  Pts are asked to imagine 3 pillars that hold them up when they are not able to hold themselves up and to share that with the group.   Participation Level:  Active  Participation Quality:  Appropriate  Affect:  Appropriate  Cognitive:  Approprate  Insight: Improving  Engagement in Group:  Engaged  Modes of Intervention:  deep breathing, Imagery. Discussion  Additional Comments:  Rates energy at a 10/10. States solitaire, being alone, faith, her children and music hold her up.   : Margaret Blackburn

## 2022-02-18 NOTE — Discharge Summary (Signed)
Physician Discharge Summary Note  Patient:  Margaret Blackburn is an 57 y.o., female  MRN:  [id number]  DOB:  06-09-1965  Patient phone:  [phone] (home)   Patient address:   [place] [id number],   Total Time spent with patient:  Greater than 30 minutes.  Date of Admission:  02/13/2022  Date of Discharge: 02-18-22.  Reason for Admission: Suicide attempt by intentional overdose on Xanax tablets.  Principal Problem: Major depressive disorder, recurrent severe without psychotic features [facility])  Discharge Diagnoses: Principal Problem:   Major depressive disorder, recurrent severe without psychotic features [facility])  Past Psychiatric History: Major depressive disorder.  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   BREAST LUMPECTOMY WITH NEEDLE LOCALIZATION Right 03/03/2018   Procedure: BREAST LUMPECTOMY WITH NEEDLE LOCALIZATION;  Surgeon: Vickie Epley, MD;  Location: [facility] ORS;  Service: General;  Laterality: Right;   COLONOSCOPY WITH PROPOFOL N/A 08/31/2019   Procedure: COLONOSCOPY WITH PROPOFOL;  Surgeon: Jonathon Bellows, MD;  Location: [facility] ENDOSCOPY;  Service: Gastroenterology;  Laterality: N/A;   SENTINEL NODE BIOPSY Right 03/21/2018   Procedure: RIGHT SENTINEL LYMPH  NODE BIOPSY;  Surgeon: Vickie Epley, MD;  Location: [facility] ORS;  Service: General;  Laterality: Right;   Family History:  Family History  Problem Relation Age of Onset   Breast cancer  Mother 68   Lung cancer Mother    Breast cancer Maternal Grandmother    Thyroid cancer Father    Family Psychiatric  History: See H&P.  Social History:  Social History   Substance and Sexual Activity  Alcohol Use No     Social History   Substance and Sexual Activity  Drug Use Yes   Types: Marijuana   Comment: 1 joint daily    Social History   Socioeconomic History   Marital status: Single    Spouse name: Not on file   Number of children: 2   Years of education: Not on file   Highest education level: Not on file  Occupational History   Not on file  Tobacco Use   Smoking status: Every Day    Packs/day: 1.50    Years: 42.00    Total pack years: 63.00    Types: Cigarettes, E-cigarettes   Smokeless tobacco: Never   Tobacco comments:    Smokes a pack and a half daily  Vaping Use   Vaping Use: Former   Devices: used  2 weeks per pt  Substance and Sexual Activity   Alcohol use: No   Drug use: Yes    Types: Marijuana    Comment: 1 joint daily   Sexual activity: Not Currently  Other Topics Concern   Not on file  Social History Narrative   Not on file   Social Determinants of Health   Financial Resource Strain: Not on file  Food Insecurity: No Food  Insecurity (02/13/2022)   Hunger Vital Sign    Worried About Running Out of Food in the Last Year: Never true    Ran Out of Food in the Last Year: Never true  Transportation Needs: No Transportation Needs (02/13/2022)   PRAPARE - Hydrologist (Medical): No    Lack of Transportation (Non-Medical): No  Physical Activity: Not on file  Stress: Not on file  Social Connections: Not on file   Hospital Course: (Per admission evaluation notes): 57 year old Caucasian female with hx of major depressive disorder & generalized anxiety disorder. Admitted to the [facility] from the [facility] ED with complaint of intentional overdose on multiple alprazolam tablets in a suicide attempt. Chart review  reports indicated that after the overdose incident, patient texted her family members to tell them what she had done. She was then brought to the ED by the EMS for evaluation/treatments. A review of her toxicology results indicated her UDS was positive for Benzodiazepine & THC. After medical evaluation & clearance, Margaret Blackburn was brought to the [facility] for further psychiatric evaluation & treatments.   Upon the decision to discharge Margaret Blackburn today, she was seen & evaluated  by her treatment team for mental health stability. The current laboratory findings were reviewed. The nurses notes & vital signs were reviewed as well. All are stable. At this present time, there are no current mental health or medical issues that should prevent this discharge at this time. Patient is being discharged to continue mental health care & medication management as noted below.  Per chart review, this is the first psychiatric admission/discharge summary from this [facility] for this 57 year old Caucasian female with prior hx of mental health issues. She was admitted to the [facility] for evaluation & treatment due to suicide attempt by intentional overdose on Xanax tablets.  After evaluation of presenting symptoms as noted on her initial admission evaluation notes above, Margaret Blackburn was recommended for mood stabilization treatments. The medication regimen for her presenting symptoms were discussed & with her consent initiated. The uses of the medications & potential adverse effects/reactions were discussed with her. She was given an ample amount of time to ask any possible questions that she may have at the time. She was treated, stabilized & was discharged on the medications as listed below on her discharge medication lists. She was also enrolled & participated in the group counseling sessions being offered & held on this unit. She learned coping skills that should help her after discharge to cope better to maintain mood stability. She presented on this  admission, other pre-existing medical conditions that required treatment & monitoring. She was treated/discharged on the medications used for those medical conditions. She tolerated her treatment regimen without any adverse effects or reactions reported.   Nanci's symptoms responded well to her treatment regimen warranting this discharge. She is also mentally & medically stable & agreeable to this discharge. During the course of this hospitalization, the 15-minute checks were adequate to ensure her safety. Patient did not display any dangerous, violent or suicidal behavior on the unit.  She interacted with patients & staff appropriately. She participated appropriately in the group sessions/therapies. Her medications were addressed & adjusted to meet her needs. She was recommended for outpatient follow-up care & medication management upon discharge to assure her continuity of care.  At the time of discharge patient is not reporting any acute suicidal/homicidal ideations. She currently denies any new issues or concerns. Education and supportive counseling  provided throughout her hospital stay & upon discharge.   Today upon her discharge evaluation with her treatment team, Meli shares she is feeling a lot better than when first admitted to the hospital. She denies any other specific concerns. She is sleeping well. Her appetite is good. She denies other physical complaints. She denies AH/VH, delusional thoughts or paranoia. She does not appear to be responding to any internal stimuli. She feels that her medications have been helpful & is in agreement to continue her current treatment regimen as recommended. She was able to engage in safety planning including plan to return to [facility] or contact emergency services if she feels unable to maintain her own safety or the safety of others. Pt had no further questions, comments, or concerns. She left [facility] with all personal belongings in no apparent distress. Transportation  per her family.   Physical Findings: AIMS:  , ,  ,  ,    CIWA:    COWS:     Musculoskeletal: Strength & Muscle Tone: within normal limits Gait & Station: normal Patient leans: N/A  Psychiatric Specialty Exam:  Presentation  General Appearance:  Appropriate for Environment; Casual; Well Groomed  Eye Contact: Good  Speech: Clear and Coherent; Normal Rate  Speech Volume: Normal  Handedness: Right  Mood and Affect  Mood: Euthymic  Affect: Appropriate; Congruent  Thought Process  Thought Processes: Coherent; Goal Directed; Linear  Descriptions of Associations:Intact  Orientation:Full (Time, Place and Person)  Thought Content:Logical  History of Schizophrenia/Schizoaffective disorder:No data recorded Duration of Psychotic Symptoms:No data recorded Hallucinations:Hallucinations: None Description of Visual Hallucinations: NA  Ideas of Reference:None  Suicidal Thoughts:Suicidal Thoughts: No SI Active Intent and/or Plan: Without Intent; Without Plan; Without Means to Carry Out; Without Access to Means  Homicidal Thoughts:Homicidal Thoughts: No  Sensorium  Memory: Immediate Good; Remote Good; Recent Good  Judgment: Good  Insight: Good  Executive Functions  Concentration: Good  Attention Span: Good  Recall: Good  Fund of Knowledge: Good  Language: Good  Psychomotor Activity  Psychomotor Activity: Psychomotor Activity: Normal  Assets  Assets: Communication Skills; Desire for Improvement; Housing; Resilience; Physical Health; Social Support  Sleep  Sleep: Sleep: Good Number of Hours of Sleep: 7.5  Physical Exam: Physical Exam Vitals and nursing note reviewed.  HENT:     Head: Normocephalic.     Nose: Nose normal.     Mouth/Throat:     Pharynx: Oropharynx is clear.  Eyes:     Pupils: Pupils are equal, round, and reactive to light.  Cardiovascular:     Pulses: Normal pulses.  Pulmonary:     Effort: Pulmonary effort is  normal.  Genitourinary:    Comments: Deferred Musculoskeletal:        General: Normal range of motion.     Cervical back: Normal range of motion.  Skin:    General: Skin is warm and dry.  Neurological:     General: No focal deficit present.     Mental Status: She is alert and oriented to person, place, and time. Mental status is at baseline.    Review of Systems  Constitutional:  Negative for chills, diaphoresis and fever.  HENT:  Negative for congestion and sore throat.   Eyes:  Negative for blurred vision.  Respiratory:  Negative for cough, shortness of breath and wheezing.   Cardiovascular:  Negative for chest pain and palpitations.  Gastrointestinal:  Negative for abdominal pain, constipation, diarrhea, heartburn, nausea and vomiting.  Genitourinary:  Negative for dysuria.  Musculoskeletal:  Negative for joint pain and myalgias.  Skin:  Negative for itching and rash.  Neurological:  Negative for dizziness, tingling, tremors, sensory change, speech change, focal weakness, seizures, loss of consciousness, weakness and headaches.  Endo/Heme/Allergies:        Allergies: PCN  Psychiatric/Behavioral:  Positive for depression (Hx of (stable on medication).) and substance abuse (Hx. THC & benzodiazepine use.). Negative for hallucinations, memory loss and suicidal ideas. The patient has insomnia (Hx. of (stable on medication).). The patient is not nervous/anxious (Stable upon discharge.).    Blood pressure (!) 119/105, pulse 98, temperature 97.9 F (36.6 C), temperature source Oral, resp. rate 20, height '5\' 2"'$  (1.575 m), weight 88.5 kg, SpO2 100 %. Body mass index is 35.67 kg/m.  Social History   Tobacco Use  Smoking Status Every Day   Packs/day: 1.50   Years: 42.00   Total pack years: 63.00   Types: Cigarettes, E-cigarettes  Smokeless Tobacco Never  Tobacco Comments   Smokes a pack and a half daily   Tobacco Cessation: An FDA-approved tobacco cessation medication recommended  at discharge  Blood Alcohol level:  Lab Results  Component Value Date   ETH <10 03/70/4888   Metabolic Disorder Labs:  Lab Results  Component Value Date   HGBA1C 5.8 (H) 02/15/2022   MPG 119.76 02/15/2022   No results found for: "PROLACTIN" Lab Results  Component Value Date   CHOL 154 12/05/2021   TRIG 94 12/05/2021   HDL 51 12/05/2021   CHOLHDL 3.0 12/05/2021   LDLCALC 86 12/05/2021   LDLCALC 113 (H) 03/22/2020   See Psychiatric Specialty Exam and Suicide Risk Assessment completed by Attending Physician prior to discharge.  Discharge destination:  Home  Is patient on multiple antipsychotic therapies at discharge:  No   Has Patient had three or more failed trials of antipsychotic monotherapy by history:  No  Recommended Plan for Multiple Antipsychotic Therapies: NA  Allergies as of 02/18/2022       Reactions   Penicillins Rash, Other (See Comments)   DID THE REACTION INVOLVE: Swelling of the face/tongue/throat, SOB, or low BP? No Sudden or severe rash/hives, skin peeling, or the inside of the mouth or nose? Yes, blisters. Not immediate. Did it require medical treatment? Yes When did it last happen? 40s If all above answers are "NO", may proceed with cephalosporin use.        Medication List     STOP taking these medications    ALPRAZolam 0.5 MG tablet Commonly known as: XANAX   azelastine 0.1 % nasal spray Commonly known as: ASTELIN   CALCIUM 600+D3 PO   cyclobenzaprine 10 MG tablet Commonly known as: FLEXERIL   diclofenac 75 MG EC tablet Commonly known as: VOLTAREN   levocetirizine 5 MG tablet Commonly known as: Xyzal   Trelegy Ellipta 100-62.5-25 MCG/ACT Aepb Generic drug: Fluticasone-Umeclidin-Vilant       TAKE these medications      Indication  albuterol 108 (90 Base) MCG/ACT inhaler Commonly known as: VENTOLIN HFA Inhale 2 puffs into the lungs every 6 (six) hours as needed for wheezing or shortness of breath.  Indication: Chronic  Obstructive Lung Disease   ALIVE WOMENS ENERGY PO Take 1 Dose by mouth daily.  Indication: Vit. supplementation.   ARIPiprazole 2 MG tablet Commonly known as: ABILIFY Take 1 tablet (2 mg total) by mouth daily. antidepressant augmentation. Start taking on: February 19, 2022  Indication: antidepressant augmentation.   DULoxetine 60 MG capsule Commonly known as: CYMBALTA Take 1 capsule (  60 mg total) by mouth daily. For depression. Start taking on: February 19, 2022  Indication: Major Depressive Disorder, Musculoskeletal Pain   hydrOXYzine 25 MG tablet Commonly known as: ATARAX Take 1 tablet (25 mg total) by mouth 3 (three) times daily as needed for anxiety.  Indication: Feeling Anxious   metFORMIN 500 MG tablet Commonly known as: GLUCOPHAGE Take 1 tablet (500 mg total) by mouth 2 (two) times daily with a meal.  Indication: prediabetes   montelukast 10 MG tablet Commonly known as: SINGULAIR Take 1 tablet (10 mg total) by mouth at bedtime.  Indication: Asthma   nicotine 21 mg/24hr patch Commonly known as: NICODERM CQ - dosed in mg/24 hours Place 1 patch (21 mg total) onto the skin daily. (May buy from over the counter): For smoking cessation Start taking on: February 19, 2022  Indication: Nicotine Addiction   pregabalin 150 MG capsule Commonly known as: Lyrica Take 1 capsule (150 mg total) by mouth 2 (two) times daily.  Indication: Neuropathic Pain   rosuvastatin 5 MG tablet Commonly known as: Crestor Take 1 tablet (5 mg total) by mouth daily.  Indication: High Amount of Fats in the Blood, Elevation of Both Cholesterol and Triglycerides in Blood   traZODone 50 MG tablet Commonly known as: DESYREL Take 1 tablet (50 mg total) by mouth at bedtime as needed for sleep.  Indication: [place]. Go on 03/29/2022.   Specialty: Behavioral Health Why: You have an appointment on 03/29/22 at 1:00 pm  with Dr. Corky Sox for medication management services. (please call to cancel if you do not wish to keep this appt)  At this time, they will refer you to the chemical/substance dependency program for intensive therapy services. * If you need to be seen sooner, you may also go to this provider for an assessment, to obtain medication management services: Monday through Friday, Arrive no later than 7:20 am. Services are provided on a first come, first served basis. Contact information: [place] Follow up on 02/22/2022.   Why: You have an appointment for therapy services on 02/22/22 at 10:00 am.   This will be a Virtual appointment. Contact information: [address] [place], [place] [address] [phone]         [facility] Follow up on 03/09/2022.   Why: You have an appointment for medication management services on 03/09/22 at 4:00 pm. This appointment will be Virtual. Contact information: [place] [address] [phone]                Follow-up recommendations: Activity:  As tolerated Diet: As recommended by your primary care doctor. Keep all scheduled follow-up appointments as recommended.  Comments: Comments: Patient is recommended to follow-up care on an outpatient basis as noted above. Prescriptions sent to pt's pharmacy of choice at discharge.   Patient agreeable to plan.   Given opportunity to ask questions.   Appears to feel comfortable with discharge denies any current suicidal or homicidal thought. Patient is also instructed prior to discharge to: Take all medications as prescribed by his/her mental healthcare provider. Report any adverse effects and or reactions from the medicines to his/her outpatient provider promptly. Patient has been instructed & cautioned: To not engage in alcohol and or illegal  drug use  while on prescription medicines. In the event of worsening symptoms, patient is instructed to call the crisis hotline, 911 and or go to the nearest ED for appropriate evaluation and treatment of symptoms. To follow-up with his/her primary care provider for your other medical issues, concerns and or health care needs.  Signed: Lindell Spar, NP, pmhnp, fnp-bc. 02/18/2022, 9:13 AM

## 2022-03-02 ENCOUNTER — Telehealth: Payer: Self-pay | Admitting: Internal Medicine

## 2022-03-02 NOTE — Telephone Encounter (Signed)
Pt self discharged-nm

## 2022-03-29 ENCOUNTER — Ambulatory Visit (HOSPITAL_COMMUNITY): Payer: Medicaid Other | Admitting: Student

## 2022-03-29 ENCOUNTER — Telehealth: Payer: Self-pay | Admitting: Internal Medicine

## 2022-03-29 NOTE — Telephone Encounter (Signed)
MR (114 pages) mailed to: [facility] [id number] [place], [place]

## 2022-04-16 ENCOUNTER — Ambulatory Visit: Payer: Medicaid Other | Admitting: Internal Medicine

## 2022-04-16 NOTE — Progress Notes (Signed)
Pt did not show for scheduled appointment.  

## 2022-04-17 ENCOUNTER — Other Ambulatory Visit: Payer: Self-pay | Admitting: Family Medicine

## 2022-04-17 ENCOUNTER — Encounter: Payer: Self-pay | Admitting: Family Medicine

## 2022-04-17 DIAGNOSIS — Z1231 Encounter for screening mammogram for malignant neoplasm of breast: Secondary | ICD-10-CM

## 2022-04-26 ENCOUNTER — Other Ambulatory Visit: Payer: Self-pay | Admitting: Nurse Practitioner

## 2022-04-26 DIAGNOSIS — E782 Mixed hyperlipidemia: Secondary | ICD-10-CM

## 2022-04-29 ENCOUNTER — Other Ambulatory Visit: Payer: Self-pay | Admitting: Acute Care

## 2022-04-29 DIAGNOSIS — F17210 Nicotine dependence, cigarettes, uncomplicated: Secondary | ICD-10-CM

## 2022-04-29 DIAGNOSIS — Z87891 Personal history of nicotine dependence: Secondary | ICD-10-CM

## 2022-04-29 DIAGNOSIS — Z122 Encounter for screening for malignant neoplasm of respiratory organs: Secondary | ICD-10-CM

## 2022-05-01 ENCOUNTER — Ambulatory Visit: Payer: Medicaid Other | Admitting: Student in an Organized Health Care Education/Training Program

## 2022-05-30 ENCOUNTER — Other Ambulatory Visit: Payer: Self-pay | Admitting: Nurse Practitioner

## 2022-05-30 DIAGNOSIS — Z760 Encounter for issue of repeat prescription: Secondary | ICD-10-CM

## 2022-05-31 ENCOUNTER — Other Ambulatory Visit: Payer: Self-pay | Admitting: Nurse Practitioner

## 2022-05-31 DIAGNOSIS — Z760 Encounter for issue of repeat prescription: Secondary | ICD-10-CM

## 2022-06-04 ENCOUNTER — Ambulatory Visit: Admission: RE | Admit: 2022-06-04 | Payer: Medicaid Other | Source: Ambulatory Visit

## 2022-07-09 ENCOUNTER — Encounter: Payer: Self-pay | Admitting: Family Medicine

## 2022-07-16 ENCOUNTER — Encounter: Payer: Self-pay | Admitting: Oncology

## 2022-07-16 ENCOUNTER — Inpatient Hospital Stay: Payer: Medicaid Other | Attending: Oncology | Admitting: Oncology

## 2022-07-16 ENCOUNTER — Telehealth: Payer: Self-pay | Admitting: *Deleted

## 2022-07-16 ENCOUNTER — Inpatient Hospital Stay: Payer: Medicaid Other

## 2022-07-16 VITALS — BP 100/66 | HR 89 | Temp 95.9°F | Resp 19 | Wt 205.2 lb

## 2022-07-16 DIAGNOSIS — Z170 Estrogen receptor positive status [ER+]: Secondary | ICD-10-CM | POA: Diagnosis not present

## 2022-07-16 DIAGNOSIS — F1290 Cannabis use, unspecified, uncomplicated: Secondary | ICD-10-CM | POA: Insufficient documentation

## 2022-07-16 DIAGNOSIS — F17210 Nicotine dependence, cigarettes, uncomplicated: Secondary | ICD-10-CM | POA: Insufficient documentation

## 2022-07-16 DIAGNOSIS — Z808 Family history of malignant neoplasm of other organs or systems: Secondary | ICD-10-CM | POA: Insufficient documentation

## 2022-07-16 DIAGNOSIS — C50411 Malignant neoplasm of upper-outer quadrant of right female breast: Secondary | ICD-10-CM | POA: Diagnosis present

## 2022-07-16 DIAGNOSIS — Z90710 Acquired absence of both cervix and uterus: Secondary | ICD-10-CM | POA: Diagnosis not present

## 2022-07-16 DIAGNOSIS — Z5181 Encounter for therapeutic drug level monitoring: Secondary | ICD-10-CM

## 2022-07-16 DIAGNOSIS — Z803 Family history of malignant neoplasm of breast: Secondary | ICD-10-CM | POA: Insufficient documentation

## 2022-07-16 DIAGNOSIS — Z08 Encounter for follow-up examination after completed treatment for malignant neoplasm: Secondary | ICD-10-CM

## 2022-07-16 DIAGNOSIS — Z801 Family history of malignant neoplasm of trachea, bronchus and lung: Secondary | ICD-10-CM | POA: Insufficient documentation

## 2022-07-16 MED ORDER — ANASTROZOLE 1 MG PO TABS: 1.0000 mg | ORAL_TABLET | Freq: Every day | ORAL | 4 refills | Status: DC

## 2022-07-16 NOTE — Telephone Encounter (Signed)
Denise sent me a secure chat that she will reach out to pt and reschedule her for the low dose screening

## 2022-07-16 NOTE — Progress Notes (Signed)
Hematology/Oncology Consult note [facility]  [phone] [phone]  Patient Care Team: Girtha Rm Eli Phillips, MD as PCP - General (Family Medicine)   Name of the patient: Margaret Blackburn  [id number]  1965/06/15   Date of visit: 07/16/22  Diagnosis-history of breast cancer in 2019  Chief complaint/ Reason for visit-routine for low up of breast cancer lost to follow-up since July 2022  Heme/Onc history: patient is a 57 year old female with no prior history of breast cancer.  She recently underwent a screening mammogram in November 2019 which showed focal architectural distortion in the upper outer portion of the right breast.  Ultrasound of the right axilla was negative for adenopathy.  Core biopsy showed atypical small glandular proliferation suspicious for tubular carcinoma.  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  7 mm, grade 1 ER greater than 90% positive, PR 51 to 90% positive and HER-2/neu negative. 5 sentinel LN negative for malignancy   Patient is status post hysterectomy but ovaries still in situ.  Hormone levels were consistent with postmenopausal status.  She completed adjuvant radiation and was started on Arimidex in May 2020      Interval history-patient has not seen me since July 2022 due to symptoms of depression and anxiety.  She reports they are better controlled at this time.  She has already scheduled her mammogram a few days from now.  Denies any breast concerns.  Appetite and weight have remained stable.  Denies any new aches and pains anywhere.  ECOG PS- 1 Pain scale- 2 Opioid associated constipation- no  Review of systems- Review of Systems  Constitutional:  Negative for chills, fever, malaise/fatigue and weight loss.  HENT:  Negative for congestion, ear discharge and nosebleeds.   Eyes:  Negative for  blurred vision.  Respiratory:  Negative for cough, hemoptysis, sputum production, shortness of breath and wheezing.   Cardiovascular:  Negative for chest pain, palpitations, orthopnea and claudication.  Gastrointestinal:  Negative for abdominal pain, blood in stool, constipation, diarrhea, heartburn, melena, nausea and vomiting.  Genitourinary:  Negative for dysuria, flank pain, frequency, hematuria and urgency.  Musculoskeletal:  Negative for back pain, joint pain and myalgias.  Skin:  Negative for rash.  Neurological:  Negative for dizziness, tingling, focal weakness, seizures, weakness and headaches.  Endo/Heme/Allergies:  Does not bruise/bleed easily.  Psychiatric/Behavioral:  Negative for depression and suicidal ideas. The patient does not have insomnia.       Allergies  Allergen Reactions   Penicillins Rash and Other (See Comments)    DID THE REACTION INVOLVE: Swelling of the face/tongue/throat, SOB, or low BP? No Sudden or severe rash/hives, skin peeling, or the inside of the mouth or nose? Yes, blisters. Not immediate. Did it require medical treatment? Yes When did it last happen? 40s If all above answers are "NO", may proceed with cephalosporin use.      Past Medical History:  Diagnosis Date   Allergy    Anxiety    Arthritis    Breast cancer (HCC) 12/2017   right breast   COPD (chronic obstructive pulmonary disease) (HCC)    Depression    Personal history of radiation therapy    Prediabetes    Sleep apnea    supposed to get CPAP 03/25/19     Past Surgical History:  Procedure Laterality Date   ABDOMINAL HYSTERECTOMY     BACK SURGERY  BREAST BIOPSY Right 12/25/2017   affirm bx , x clip,TUBULAR  CARCINOMA   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   BREAST LUMPECTOMY WITH NEEDLE LOCALIZATION Right 03/03/2018   Procedure: BREAST LUMPECTOMY WITH NEEDLE LOCALIZATION;  Surgeon: Ancil Linsey, MD;  Location: [facility] ORS;  Service: General;   Laterality: Right;   COLONOSCOPY WITH PROPOFOL N/A 08/31/2019   Procedure: COLONOSCOPY WITH PROPOFOL;  Surgeon: Wyline Mood, MD;  Location: [facility] ENDOSCOPY;  Service: Gastroenterology;  Laterality: N/A;   SENTINEL NODE BIOPSY Right 03/21/2018   Procedure: RIGHT SENTINEL LYMPH  NODE BIOPSY;  Surgeon: Ancil Linsey, MD;  Location: [facility] ORS;  Service: General;  Laterality: Right;    Social History   Socioeconomic History   Marital status: Single    Spouse name: Not on file   Number of children: 2   Years of education: Not on file   Highest education level: Not on file  Occupational History   Not on file  Tobacco Use   Smoking status: Every Day    Packs/day: 1.50    Years: 42.00    Additional pack years: 0.00    Total pack years: 63.00    Types: Cigarettes, E-cigarettes   Smokeless tobacco: Never   Tobacco comments:    Smokes a pack and a half daily  Vaping Use   Vaping Use: Former   Devices: used  2 weeks per pt  Substance and Sexual Activity   Alcohol use: No   Drug use: Yes    Types: Marijuana    Comment: 1 joint daily   Sexual activity: Not Currently  Other Topics Concern   Not on file  Social History Narrative   Not on file   Social Determinants of Health   Financial Resource Strain: Not on file  Food Insecurity: No Food Insecurity (02/13/2022)   Hunger Vital Sign    Worried About Running Out of Food in the Last Year: Never true    Ran Out of Food in the Last Year: Never true  Transportation Needs: No Transportation Needs (02/13/2022)   PRAPARE - Administrator, Civil Service (Medical): No    Lack of Transportation (Non-Medical): No  Physical Activity: Not on file  Stress: Not on file  Social Connections: Not on file  Intimate Partner Violence: Not At Risk (02/13/2022)   Humiliation, Afraid, Rape, and Kick questionnaire    Fear of Current or Ex-Partner: No    Emotionally Abused: No    Physically Abused: No    Sexually Abused: No    Family History   Problem Relation Age of Onset   Breast cancer Mother 73   Lung cancer Mother    Breast cancer Maternal Grandmother    Thyroid cancer Father      Current Outpatient Medications:    albuterol (VENTOLIN HFA) 108 (90 Base) MCG/ACT inhaler, Inhale 2 puffs into the lungs every 6 (six) hours as needed for wheezing or shortness of breath., Disp: 18 g, Rfl: 5   ARIPiprazole (ABILIFY) 2 MG tablet, Take 1 tablet (2 mg total) by mouth daily. antidepressant augmentation., Disp: 30 tablet, Rfl: 0   DULoxetine (CYMBALTA) 60 MG capsule, Take 1 capsule (60 mg total) by mouth daily. For depression., Disp: 30 capsule, Rfl: 0   hydrOXYzine (ATARAX) 25 MG tablet, Take 1 tablet (25 mg total) by mouth 3 (three) times daily as needed for anxiety., Disp: 90 tablet, Rfl: 0   metFORMIN (GLUCOPHAGE) 500 MG tablet, Take 1 tablet (500  mg total) by mouth 2 (two) times daily with a meal., Disp: 180 tablet, Rfl: 3   montelukast (SINGULAIR) 10 MG tablet, Take 1 tablet (10 mg total) by mouth at bedtime., Disp: 90 tablet, Rfl: 3   Multiple Vitamins-Minerals (ALIVE WOMENS ENERGY PO), Take 1 Dose by mouth daily., Disp: , Rfl:    nicotine (NICODERM CQ - DOSED IN MG/24 HOURS) 21 mg/24hr patch, Place 1 patch (21 mg total) onto the skin daily. (May buy from over the counter): For smoking cessation, Disp: 28 patch, Rfl: 0   pregabalin (LYRICA) 150 MG capsule, Take 1 capsule (150 mg total) by mouth 2 (two) times daily., Disp: 60 capsule, Rfl: 2   rosuvastatin (CRESTOR) 5 MG tablet, Take 1 tablet (5 mg total) by mouth daily., Disp: 90 tablet, Rfl: 3   traZODone (DESYREL) 50 MG tablet, Take 1 tablet (50 mg total) by mouth at bedtime as needed for sleep., Disp: 30 tablet, Rfl: 0  Physical exam:  Vitals:   07/16/22 0931  Weight: 205 lb 3.2 oz (93.1 kg)   Physical Exam Cardiovascular:     Rate and Rhythm: Normal rate and regular rhythm.     Heart sounds: Normal heart sounds.  Pulmonary:     Effort: Pulmonary effort is normal.      Breath sounds: Normal breath sounds.  Abdominal:     General: Bowel sounds are normal.     Palpations: Abdomen is soft.  Skin:    General: Skin is warm and dry.  Neurological:     Mental Status: She is alert and oriented to person, place, and time.    Breast exam was performed in seated and lying down position. Patient is status post right lumpectomy with a well-healed surgical scar. No evidence of any palpable masses. No evidence of axillary adenopathy. No evidence of any palpable masses or lumps in the left breast. No evidence of leftt axillary adenopathy      Latest Ref Rng & Units 02/12/2022   12:36 PM  CMP  Glucose 70 - 99 mg/dL 409   BUN 6 - 20 mg/dL 9   Creatinine 8.11 - 9.14 mg/dL 7.82   Sodium 956 - 213 mmol/L 138   Potassium 3.5 - 5.1 mmol/L 4.4   Chloride 98 - 111 mmol/L 103   CO2 22 - 32 mmol/L 25   Calcium 8.9 - 10.3 mg/dL 8.8   Total Protein 6.5 - 8.1 g/dL 7.1   Total Bilirubin 0.3 - 1.2 mg/dL 1.1   Alkaline Phos 38 - 126 U/L 55   AST 15 - 41 U/L 29   ALT 0 - 44 U/L 24       Latest Ref Rng & Units 02/12/2022   12:36 PM  CBC  WBC 4.0 - 10.5 K/uL 8.2   Hemoglobin 12.0 - 15.0 g/dL 08.6   Hematocrit 57.8 - 46.0 % 42.5   Platelets 150 - 400 K/uL 214      Assessment and plan- Patient is a 57 y.o. 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  This is a routine follow-up of breast cancer on Arimidex  Patient was lost to follow-up after July 2022.  She took Arimidex until then.  She ideally needs to take it for 5 years ending in May 2025.  She is agreeable to restarting at this time and we will send in a refill for the same.  She is already scheduled her mammogram a few days from now.  Clinically she is doing well with no concerning signs and symptoms of recurrence based on today's exam.  I will see her back in 6 months no  labs  Patient was also getting yearly low-dose CT scan for lung cancer screening protocol.  She is overdue for it and we will refer her back to the program.  I will consider getting a bone density scan after my next visit with   Visit Diagnosis 1. Visit for monitoring Arimidex therapy   2. Encounter for follow-up surveillance of breast cancer      Dr. Owens Shark, MD, MPH [facility] at [facility] [phone] 07/16/2022 9:32 AM

## 2022-07-16 NOTE — Telephone Encounter (Signed)
I called pulmonary and was told that Margaret Blackburn and she does the low dose ct chest for smokers. There is a order for the scan to be done 06/02/2022. I looked in the scans and the pt had appt and she was no show. Asked to reschedule

## 2022-07-18 ENCOUNTER — Telehealth: Payer: Self-pay | Admitting: *Deleted

## 2022-07-18 NOTE — Telephone Encounter (Signed)
Left detailed message for pt to call back to reschedule lung screening CT.

## 2022-07-24 ENCOUNTER — Other Ambulatory Visit: Payer: Self-pay | Admitting: Nurse Practitioner

## 2022-07-24 DIAGNOSIS — E782 Mixed hyperlipidemia: Secondary | ICD-10-CM

## 2022-07-31 ENCOUNTER — Ambulatory Visit
Admission: RE | Admit: 2022-07-31 | Discharge: 2022-07-31 | Disposition: A | Discharge status: Home / Self Care | Payer: Medicaid Other | Source: Ambulatory Visit | Attending: Acute Care | Admitting: Acute Care

## 2022-07-31 DIAGNOSIS — F17210 Nicotine dependence, cigarettes, uncomplicated: Secondary | ICD-10-CM | POA: Diagnosis present

## 2022-07-31 DIAGNOSIS — Z122 Encounter for screening for malignant neoplasm of respiratory organs: Secondary | ICD-10-CM | POA: Insufficient documentation

## 2022-07-31 DIAGNOSIS — Z87891 Personal history of nicotine dependence: Secondary | ICD-10-CM | POA: Diagnosis present

## 2022-08-01 ENCOUNTER — Other Ambulatory Visit: Payer: Self-pay | Admitting: Nurse Practitioner

## 2022-08-01 DIAGNOSIS — E782 Mixed hyperlipidemia: Secondary | ICD-10-CM

## 2022-08-06 ENCOUNTER — Other Ambulatory Visit: Payer: Self-pay | Admitting: Acute Care

## 2022-08-06 DIAGNOSIS — F17210 Nicotine dependence, cigarettes, uncomplicated: Secondary | ICD-10-CM

## 2022-08-06 DIAGNOSIS — Z122 Encounter for screening for malignant neoplasm of respiratory organs: Secondary | ICD-10-CM

## 2022-08-06 DIAGNOSIS — Z87891 Personal history of nicotine dependence: Secondary | ICD-10-CM

## 2022-08-27 ENCOUNTER — Other Ambulatory Visit: Payer: Self-pay | Admitting: Nurse Practitioner

## 2022-08-27 DIAGNOSIS — R7303 Prediabetes: Secondary | ICD-10-CM

## 2022-11-21 ENCOUNTER — Other Ambulatory Visit: Payer: Self-pay | Admitting: Nurse Practitioner

## 2022-11-21 DIAGNOSIS — Z760 Encounter for issue of repeat prescription: Secondary | ICD-10-CM

## 2022-11-29 ENCOUNTER — Other Ambulatory Visit: Payer: Self-pay | Admitting: Nurse Practitioner

## 2022-11-29 DIAGNOSIS — Z760 Encounter for issue of repeat prescription: Secondary | ICD-10-CM

## 2022-12-05 ENCOUNTER — Ambulatory Visit: Payer: Medicaid Other | Admitting: Family Medicine

## 2022-12-20 ENCOUNTER — Encounter: Payer: Medicaid Other | Admitting: Nurse Practitioner

## 2022-12-26 ENCOUNTER — Other Ambulatory Visit: Payer: Self-pay | Admitting: Oncology

## 2022-12-26 DIAGNOSIS — Z853 Personal history of malignant neoplasm of breast: Secondary | ICD-10-CM

## 2022-12-26 DIAGNOSIS — Z1231 Encounter for screening mammogram for malignant neoplasm of breast: Secondary | ICD-10-CM

## 2022-12-28 ENCOUNTER — Ambulatory Visit: Payer: Self-pay

## 2022-12-31 ENCOUNTER — Encounter: Payer: Self-pay | Admitting: Family Medicine

## 2022-12-31 ENCOUNTER — Ambulatory Visit (INDEPENDENT_AMBULATORY_CARE_PROVIDER_SITE_OTHER): Payer: Medicaid Other | Admitting: Family Medicine

## 2022-12-31 VITALS — BP 100/80 | HR 102 | Temp 97.9°F | Ht 62.0 in | Wt 207.0 lb

## 2022-12-31 DIAGNOSIS — Z23 Encounter for immunization: Secondary | ICD-10-CM

## 2022-12-31 DIAGNOSIS — F17210 Nicotine dependence, cigarettes, uncomplicated: Secondary | ICD-10-CM

## 2022-12-31 DIAGNOSIS — E66812 Obesity, class 2: Secondary | ICD-10-CM | POA: Diagnosis not present

## 2022-12-31 DIAGNOSIS — F321 Major depressive disorder, single episode, moderate: Secondary | ICD-10-CM | POA: Diagnosis not present

## 2022-12-31 DIAGNOSIS — C50919 Malignant neoplasm of unspecified site of unspecified female breast: Secondary | ICD-10-CM

## 2022-12-31 DIAGNOSIS — I700 Atherosclerosis of aorta: Secondary | ICD-10-CM | POA: Diagnosis not present

## 2022-12-31 DIAGNOSIS — J4489 Other specified chronic obstructive pulmonary disease: Secondary | ICD-10-CM | POA: Diagnosis not present

## 2022-12-31 DIAGNOSIS — Z114 Encounter for screening for human immunodeficiency virus [HIV]: Secondary | ICD-10-CM

## 2022-12-31 DIAGNOSIS — Z1159 Encounter for screening for other viral diseases: Secondary | ICD-10-CM

## 2022-12-31 DIAGNOSIS — K219 Gastro-esophageal reflux disease without esophagitis: Secondary | ICD-10-CM | POA: Insufficient documentation

## 2022-12-31 DIAGNOSIS — Z6837 Body mass index (BMI) 37.0-37.9, adult: Secondary | ICD-10-CM

## 2022-12-31 DIAGNOSIS — J3089 Other allergic rhinitis: Secondary | ICD-10-CM | POA: Insufficient documentation

## 2022-12-31 DIAGNOSIS — M7061 Trochanteric bursitis, right hip: Secondary | ICD-10-CM

## 2022-12-31 DIAGNOSIS — F411 Generalized anxiety disorder: Secondary | ICD-10-CM

## 2022-12-31 DIAGNOSIS — Z13820 Encounter for screening for osteoporosis: Secondary | ICD-10-CM

## 2022-12-31 MED ORDER — TRELEGY ELLIPTA 100-62.5-25 MCG/ACT IN AEPB: 1.0000 | INHALATION_SPRAY | Freq: Every day | RESPIRATORY_TRACT | 11 refills | Status: DC

## 2022-12-31 MED ORDER — CETIRIZINE HCL 10 MG PO TABS: 10.0000 mg | ORAL_TABLET | Freq: Every day | ORAL | 11 refills | Status: AC

## 2022-12-31 NOTE — Assessment & Plan Note (Signed)
Counseled on importance of weight management for overall health. Encouraged low calorie, heart healthy diet and moderate intensity exercise 150 minutes weekly. This is 3-5 times weekly for 30-50 minutes each session. Goal should be pace of 3 miles/hours, or walking 1.5 miles in 30 minutes and include strength training.

## 2022-12-31 NOTE — Assessment & Plan Note (Signed)
Chronic well controlled on Omeprazole 20mg  daily.

## 2022-12-31 NOTE — Assessment & Plan Note (Addendum)
Chronic. Well controlled on current regimen. Continue Cymbalta, Valium, Abilfy, Hydroxyzine, and Trazodone. No current SI/HI and she does have action plan if these occur.     12/31/2022   10:44 AM  GAD 7 : Generalized Anxiety Score  Nervous, Anxious, on Edge 3  Control/stop worrying 1  Worry too much - different things 2  Trouble relaxing 3  Restless 0  Easily annoyed or irritable 3  Afraid - awful might happen 0  Total GAD 7 Score 12  Anxiety Difficulty Somewhat difficult

## 2022-12-31 NOTE — Assessment & Plan Note (Addendum)
Chronic. Well controlled on current regimen. Continue Cymbalta, Valium, Abilfy, Hydroxyzine, and Trazodone. No current SI/HI and she does have action plan if these occur.     12/31/2022   10:44 AM 03/14/2021    9:36 AM 12/09/2020    9:26 AM 09/08/2020    8:41 AM 03/14/2020    8:46 AM  Depression screen PHQ 2/9  Decreased Interest 3 0 0 1 0  Down, Depressed, Hopeless 3 1 0 1 0  PHQ - 2 Score 6 1 0 2 0  Altered sleeping 1   1   Tired, decreased energy 3   3   Change in appetite 2   3   Feeling bad or failure about yourself  2   1   Trouble concentrating 1   1   Moving slowly or fidgety/restless 0   0   Suicidal thoughts 1   0   PHQ-9 Score 16   11   Difficult doing work/chores Somewhat difficult

## 2022-12-31 NOTE — Assessment & Plan Note (Signed)
Chronic. Will refer back to Pulm. Refill provided for Trelegy. Encouraged to seek medical care if symptoms worsen or not controlled on Trelegy.

## 2022-12-31 NOTE — Patient Instructions (Signed)
It was great to meet you today and I'm excited to have you join the [facility] practice. I hope you had a positive experience today! If you feel so inclined, please feel free to recommend our practice to friends and family. Mila Merry, FNP-C

## 2022-12-31 NOTE — Assessment & Plan Note (Signed)
Medical records requested. She would like to speak with Ortho regarding treatment options.

## 2022-12-31 NOTE — Assessment & Plan Note (Signed)
Previously discussed with recent CT results. BP well controlled. Will obtain fasting labs and assess control of hyperlipidemia and prediabetes.

## 2022-12-31 NOTE — Addendum Note (Signed)
Addended by: Arta Silence on: 12/31/2022 04:38 PM   Modules accepted: Orders

## 2022-12-31 NOTE — Assessment & Plan Note (Signed)
Chronic. She does not feel improvement with Singulair, will DC given BBW and her psychiatric history and start Zyrtec daily. Please let office know if symptoms uncontrolled.

## 2022-12-31 NOTE — Assessment & Plan Note (Signed)
3-5 minute discussion regarding the harms of tobacco use, the benefits of cessation, and methods of cessation. Discussed that there are medication options to help with cessation. Provided printed education on steps to quit smoking. Patient is not ready to use a medication to help.

## 2022-12-31 NOTE — Progress Notes (Signed)
New Patient Office Visit  Subjective    Patient ID: Margaret Blackburn, female    DOB: 08/07/1965  Age: 57 y.o. MRN: [id number]  CC:  Chief Complaint  Patient presents with   Establish Care    HPI Margaret Blackburn presents to establish care. Oriented to practice routines and expectations. Has been seeing PCP routinely. PMH as below. She does see an Best boy, Diplomatic Services operational officer (recently DC from prior practice), and [employer]. Her anxiety and depression are somewhat controlled on Cymbalta, Valium, Abilify, and Hydroxyzine. She also takes Trazodone to help her sleep. She is also prescribed Remeron but is not taking this. She is followed every 1-2 months. Denies SI/HI however she has had these in the past, does have an action plan if they occur. She is on Singulair for many years for control of her allergy symptoms that include (bronchitis, coughing, sinus infections, runny nose, itching eyes). Has tried Xyzal in past, does not feel like Singulair is working. She is a current smoker and has cut from 2 to 1 packs per day. Is not using Nicotine patches currently. She also has GERD well controlled on Prilosec 20 mg daily. Her COPD is well controlled on Trelegy but she is currently out and is having to use her Albuterol 1-2 times daily.  Breast CA screening: Mammogram status: Ordered 01/21/2023. Pt provided with contact info and advised to call to schedule appt.  Cervical CA screening: approximate date 5 years ago and was normal. She has had a partial hysterectomy. Colon CA screening: colonoscopy 3 years ago without abnormalities. DEXA: DEXA scan, most current DEXA scan T-score is minus -1.5. Ordered. Tobacco: smoker  (1 ppd x 40 yrs) STI: declines Vaccines: declines covid, flu. Shingles, PNA, TDaP today    Outpatient Encounter Medications as of 12/31/2022  Medication Sig   albuterol (VENTOLIN HFA) 108 (90 Base) MCG/ACT inhaler Inhale 2 puffs into the lungs every 6 (six) hours as needed  for wheezing or shortness of breath.   anastrozole (ARIMIDEX) 1 MG tablet Take 1 tablet (1 mg total) by mouth daily.   ARIPiprazole (ABILIFY) 2 MG tablet Take 1 tablet (2 mg total) by mouth daily. antidepressant augmentation.   cetirizine (ZYRTEC) 10 MG tablet Take 1 tablet (10 mg total) by mouth daily.   diazepam (VALIUM) 5 MG tablet Take 5 mg by mouth every 6 (six) hours as needed for anxiety.   DULoxetine (CYMBALTA) 60 MG capsule Take 1 capsule (60 mg total) by mouth daily. For depression.   Fluticasone-Umeclidin-Vilant (TRELEGY ELLIPTA) 100-62.5-25 MCG/ACT AEPB Inhale 1 puff into the lungs daily.   hydrOXYzine (ATARAX) 25 MG tablet Take 1 tablet (25 mg total) by mouth 3 (three) times daily as needed for anxiety.   metFORMIN (GLUCOPHAGE) 500 MG tablet Take 1 tablet (500 mg total) by mouth 2 (two) times daily with a meal.   mirtazapine (REMERON) 15 MG tablet Take 15 mg by mouth at bedtime.   Multiple Vitamins-Minerals (ALIVE WOMENS ENERGY PO) Take 1 Dose by mouth daily.   nicotine (NICODERM CQ - DOSED IN MG/24 HOURS) 21 mg/24hr patch Place 1 patch (21 mg total) onto the skin daily. (May buy from over the counter): For smoking cessation   omeprazole (PRILOSEC) 20 MG capsule Take 20 mg by mouth daily.   pregabalin (LYRICA) 150 MG capsule Take 1 capsule (150 mg total) by mouth 2 (two) times daily.   rosuvastatin (CRESTOR) 5 MG tablet Take 1 tablet (5 mg total) by mouth daily.   traZODone (  DESYREL) 50 MG tablet Take 1 tablet (50 mg total) by mouth at bedtime as needed for sleep.   [DISCONTINUED] Fluticasone-Umeclidin-Vilant (TRELEGY ELLIPTA IN) Inhale into the lungs.   [DISCONTINUED] montelukast (SINGULAIR) 10 MG tablet Take 1 tablet (10 mg total) by mouth at bedtime.   No facility-administered encounter medications on file as of 12/31/2022.    Past Medical History:  Diagnosis Date   Allergy    Anxiety    Aortic atherosclerosis (HCC)    Arthritis    Breast cancer (HCC) 12/2017   right  breast   COPD (chronic obstructive pulmonary disease) (HCC)    Depression    GERD (gastroesophageal reflux disease)    Personal history of radiation therapy    Prediabetes    Scoliosis    Sleep apnea    supposed to get CPAP 03/25/19    Past Surgical History:  Procedure Laterality Date   ABDOMINAL HYSTERECTOMY     BACK SURGERY     BREAST BIOPSY Right 12/25/2017   affirm bx , x clip,TUBULAR  CARCINOMA   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   BREAST LUMPECTOMY WITH NEEDLE LOCALIZATION Right 03/03/2018   Procedure: BREAST LUMPECTOMY WITH NEEDLE LOCALIZATION;  Surgeon: Ancil Linsey, MD;  Location: [facility] ORS;  Service: General;  Laterality: Right;   COLONOSCOPY WITH PROPOFOL N/A 08/31/2019   Procedure: COLONOSCOPY WITH PROPOFOL;  Surgeon: Wyline Mood, MD;  Location: [facility] ENDOSCOPY;  Service: Gastroenterology;  Laterality: N/A;   SENTINEL NODE BIOPSY Right 03/21/2018   Procedure: RIGHT SENTINEL LYMPH  NODE BIOPSY;  Surgeon: Ancil Linsey, MD;  Location: [facility] ORS;  Service: General;  Laterality: Right;    Family History  Problem Relation Age of Onset   Breast cancer Mother 69   Lung cancer Mother    Breast cancer Maternal Grandmother    Thyroid cancer Father     Social History   Socioeconomic History   Marital status: Single    Spouse name: Not on file   Number of children: 2   Years of education: Not on file   Highest education level: Not on file  Occupational History   Not on file  Tobacco Use   Smoking status: Every Day    Current packs/day: 1.50    Average packs/day: 1.5 packs/day for 42.0 years (63.0 ttl pk-yrs)    Types: Cigarettes, E-cigarettes   Smokeless tobacco: Never   Tobacco comments:    Smokes a pack and a half daily  Vaping Use   Vaping status: Former   Devices: used  2 weeks per pt  Substance and Sexual Activity   Alcohol use: No   Drug use: Yes    Types: Marijuana    Comment: 1 joint daily   Sexual activity: Not Currently   Other Topics Concern   Not on file  Social History Narrative   Not on file   Social Determinants of Health   Financial Resource Strain: Not on file  Food Insecurity: No Food Insecurity (02/13/2022)   Hunger Vital Sign    Worried About Running Out of Food in the Last Year: Never true    Ran Out of Food in the Last Year: Never true  Transportation Needs: No Transportation Needs (02/13/2022)   PRAPARE - Administrator, Civil Service (Medical): No    Lack of Transportation (Non-Medical): No  Physical Activity: Not on file  Stress: Not on file  Social Connections: Not on file  Intimate Partner Violence: Not At Risk (  02/13/2022)   Humiliation, Afraid, Rape, and Kick questionnaire    Fear of Current or Ex-Partner: No    Emotionally Abused: No    Physically Abused: No    Sexually Abused: No    Review of Systems  Respiratory:  Positive for shortness of breath.   Cardiovascular: Negative.   Gastrointestinal:  Positive for heartburn. Negative for abdominal pain and vomiting.  Psychiatric/Behavioral:  Positive for depression. Negative for suicidal ideas. The patient is nervous/anxious.   All other systems reviewed and are negative.       Objective    BP 100/80   Pulse (!) 102   Temp 97.9 F (36.6 C) (Oral)   Ht 5\' 2"  (1.575 m)   Wt 207 lb (93.9 kg)   SpO2 94%   BMI 37.86 kg/m   Physical Exam Vitals and nursing note reviewed.  Constitutional:      Appearance: Normal appearance. She is normal weight.  HENT:     Head: Normocephalic and atraumatic.  Cardiovascular:     Rate and Rhythm: Normal rate and regular rhythm.     Pulses: Normal pulses.     Heart sounds: Normal heart sounds.  Pulmonary:     Effort: Pulmonary effort is normal.     Breath sounds: Wheezing and rhonchi present.  Skin:    General: Skin is warm and dry.  Neurological:     General: No focal deficit present.     Mental Status: She is alert and oriented to person, place, and time. Mental status  is at baseline.  Psychiatric:        Mood and Affect: Mood normal.        Behavior: Behavior normal.        Thought Content: Thought content normal.        Judgment: Judgment normal.         Assessment & Plan:   Problem List Items Addressed This Visit     Invasive carcinoma of breast (HCC)   Relevant Medications   diazepam (VALIUM) 5 MG tablet   Trochanteric bursitis of both hips    Medical records requested. She would like to speak with Ortho regarding treatment options.       Relevant Orders   Ambulatory referral to Orthopedic Surgery   GAD (generalized anxiety disorder)    Chronic. Well controlled on current regimen. Continue Cymbalta, Valium, Abilfy, Hydroxyzine, and Trazodone. No current SI/HI and she does have action plan if these occur.     12/31/2022   10:44 AM  GAD 7 : Generalized Anxiety Score  Nervous, Anxious, on Edge 3  Control/stop worrying 1  Worry too much - different things 2  Trouble relaxing 3  Restless 0  Easily annoyed or irritable 3  Afraid - awful might happen 0  Total GAD 7 Score 12  Anxiety Difficulty Somewhat difficult          Relevant Medications   diazepam (VALIUM) 5 MG tablet   mirtazapine (REMERON) 15 MG tablet   Depression, major, single episode, moderate (HCC)    Chronic. Well controlled on current regimen. Continue Cymbalta, Valium, Abilfy, Hydroxyzine, and Trazodone. No current SI/HI and she does have action plan if these occur.     12/31/2022   10:44 AM 03/14/2021    9:36 AM 12/09/2020    9:26 AM 09/08/2020    8:41 AM 03/14/2020    8:46 AM  Depression screen PHQ 2/9  Decreased Interest 3 0 0 1 0  Down, Depressed, Hopeless 3  1 0 1 0  PHQ - 2 Score 6 1 0 2 0  Altered sleeping 1   1   Tired, decreased energy 3   3   Change in appetite 2   3   Feeling bad or failure about yourself  2   1   Trouble concentrating 1   1   Moving slowly or fidgety/restless 0   0   Suicidal thoughts 1   0   PHQ-9 Score 16   11   Difficult  doing work/chores Somewhat difficult              Relevant Medications   diazepam (VALIUM) 5 MG tablet   mirtazapine (REMERON) 15 MG tablet   Obstructive chronic bronchitis without exacerbation (HCC) - Primary    Chronic. Will refer back to Pulm. Refill provided for Trelegy. Encouraged to seek medical care if symptoms worsen or not controlled on Trelegy.      Relevant Medications   cetirizine (ZYRTEC) 10 MG tablet   Fluticasone-Umeclidin-Vilant (TRELEGY ELLIPTA) 100-62.5-25 MCG/ACT AEPB   Other Relevant Orders   Ambulatory referral to Pulmonology   Aortic atherosclerosis [facility])    Previously discussed with recent CT results. BP well controlled. Will obtain fasting labs and assess control of hyperlipidemia and prediabetes.      Class 2 severe obesity due to excess calories with serious comorbidity and body mass index (BMI) of 37.0 to 37.9 in adult [facility])    Counseled on importance of weight management for overall health. Encouraged low calorie, heart healthy diet and moderate intensity exercise 150 minutes weekly. This is 3-5 times weekly for 30-50 minutes each session. Goal should be pace of 3 miles/hours, or walking 1.5 miles in 30 minutes and include strength training.      Relevant Orders   CBC with Differential/Platelet   COMPLETE METABOLIC PANEL WITH GFR   Lipid panel   Hemoglobin A1c   Environmental and seasonal allergies    Chronic. She does not feel improvement with Singulair, will DC given BBW and her psychiatric history and start Zyrtec daily. Please let office know if symptoms uncontrolled.      Gastroesophageal reflux disease without esophagitis    Chronic well controlled on Omeprazole 20mg  daily.      Relevant Medications   omeprazole (PRILOSEC) 20 MG capsule   Tobacco dependence due to cigarettes    3-5 minute discussion regarding the harms of tobacco use, the benefits of cessation, and methods of cessation. Discussed that there are medication options to help  with cessation. Provided printed education on steps to quit smoking. Patient is not ready to use a medication to help.       Other Visit Diagnoses     Encounter for osteoporosis screening in asymptomatic postmenopausal patient       Relevant Orders   DG Bone Density   Screening for HIV (human immunodeficiency virus)       Relevant Orders   HIV Antibody (routine testing w rflx)   Need for hepatitis C screening test       Relevant Orders   Hepatitis C antibody       Return for ASAP annual physical.   Park Meo, FNP

## 2023-01-01 LAB — COMPLETE METABOLIC PANEL WITHOUT GFR
AG Ratio: 1.4 (calc) (ref 1.0–2.5)
ALT: 9 U/L (ref 6–29)
AST: 9 U/L — ABNORMAL LOW (ref 10–35)
Albumin: 4 g/dL (ref 3.6–5.1)
Alkaline phosphatase (APISO): 71 U/L (ref 37–153)
BUN: 14 mg/dL (ref 7–25)
CO2: 29 mmol/L (ref 20–32)
Calcium: 9.4 mg/dL (ref 8.6–10.4)
Chloride: 102 mmol/L (ref 98–110)
Creat: 0.59 mg/dL (ref 0.50–1.03)
Globulin: 2.9 g/dL (ref 1.9–3.7)
Glucose, Bld: 130 mg/dL — ABNORMAL HIGH (ref 65–99)
Potassium: 4.6 mmol/L (ref 3.5–5.3)
Sodium: 139 mmol/L (ref 135–146)
Total Bilirubin: 0.4 mg/dL (ref 0.2–1.2)
Total Protein: 6.9 g/dL (ref 6.1–8.1)
eGFR: 106 mL/min/1.73m2 (ref >=60)

## 2023-01-01 LAB — CBC WITH DIFFERENTIAL/PLATELET
Absolute Lymphocytes: 2071 {cells}/uL (ref 850–3900)
Absolute Monocytes: 687 {cells}/uL (ref 200–950)
Basophils Absolute: 76 {cells}/uL (ref 0–200)
Basophils Relative: 0.7 %
Eosinophils Absolute: 273 {cells}/uL (ref 15–500)
Eosinophils Relative: 2.5 %
HCT: 44 % (ref 35.0–45.0)
Hemoglobin: 14.5 g/dL (ref 11.7–15.5)
MCH: 30.3 pg (ref 27.0–33.0)
MCHC: 33 g/dL (ref 32.0–36.0)
MCV: 91.9 fL (ref 80.0–100.0)
MPV: 10.5 fL (ref 7.5–12.5)
Monocytes Relative: 6.3 %
Neutro Abs: 7794 {cells}/uL (ref 1500–7800)
Neutrophils Relative %: 71.5 %
Platelets: 332 10*3/uL (ref 140–400)
RBC: 4.79 10*6/uL (ref 3.80–5.10)
RDW: 13.1 % (ref 11.0–15.0)
Total Lymphocyte: 19 %
WBC: 10.9 10*3/uL — ABNORMAL HIGH (ref 3.8–10.8)

## 2023-01-01 LAB — HEMOGLOBIN A1C
Hgb A1c MFr Bld: 7 %{Hb} — ABNORMAL HIGH (ref <5.7)
Mean Plasma Glucose: 154 mg/dL
eAG (mmol/L): 8.5 mmol/L

## 2023-01-01 LAB — LIPID PANEL
Cholesterol: 174 mg/dL (ref <200)
HDL: 52 mg/dL (ref >=50)
LDL Cholesterol (Calc): 92 mg/dL
Non-HDL Cholesterol (Calc): 122 mg/dL (ref <130)
Total CHOL/HDL Ratio: 3.3 (calc) (ref <5.0)
Triglycerides: 210 mg/dL — ABNORMAL HIGH (ref <150)

## 2023-01-01 LAB — HEPATITIS C ANTIBODY: Hepatitis C Ab: NONREACTIVE

## 2023-01-01 LAB — HIV ANTIBODY (ROUTINE TESTING W REFLEX): HIV 1&2 Ab, 4th Generation: NONREACTIVE

## 2023-01-07 ENCOUNTER — Other Ambulatory Visit: Payer: Self-pay

## 2023-01-07 ENCOUNTER — Other Ambulatory Visit: Payer: Self-pay | Admitting: Family Medicine

## 2023-01-07 DIAGNOSIS — R7303 Prediabetes: Secondary | ICD-10-CM

## 2023-01-11 ENCOUNTER — Other Ambulatory Visit (INDEPENDENT_AMBULATORY_CARE_PROVIDER_SITE_OTHER): Payer: Medicaid Other

## 2023-01-11 ENCOUNTER — Other Ambulatory Visit (INDEPENDENT_AMBULATORY_CARE_PROVIDER_SITE_OTHER): Payer: Self-pay

## 2023-01-11 ENCOUNTER — Ambulatory Visit (INDEPENDENT_AMBULATORY_CARE_PROVIDER_SITE_OTHER): Payer: Medicaid Other | Admitting: Orthopaedic Surgery

## 2023-01-11 DIAGNOSIS — M25551 Pain in right hip: Secondary | ICD-10-CM | POA: Diagnosis not present

## 2023-01-11 DIAGNOSIS — M5441 Lumbago with sciatica, right side: Secondary | ICD-10-CM

## 2023-01-11 DIAGNOSIS — M5442 Lumbago with sciatica, left side: Secondary | ICD-10-CM

## 2023-01-11 DIAGNOSIS — G8929 Other chronic pain: Secondary | ICD-10-CM | POA: Diagnosis not present

## 2023-01-11 DIAGNOSIS — M25552 Pain in left hip: Secondary | ICD-10-CM

## 2023-01-11 MED ORDER — BUPIVACAINE HCL 0.5 % IJ SOLN
5.0000 mL | INTRAMUSCULAR | Status: AC | PRN
  Administered 2023-01-11: 5 mL via INTRA_ARTICULAR

## 2023-01-11 MED ORDER — METHYLPREDNISOLONE ACETATE 40 MG/ML IJ SUSP
40.0000 mg | INTRAMUSCULAR | Status: AC | PRN
  Administered 2023-01-11: 40 mg via INTRA_ARTICULAR

## 2023-01-11 NOTE — Progress Notes (Signed)
Office Visit Note   Patient: Margaret Blackburn           Date of Birth: 1965/05/24           MRN: [id number] Visit Date: 01/11/2023              Requested by: Park Meo, FNP [id number] [place],  [place] [address] PCP: Park Meo, FNP   Assessment & Plan: Visit Diagnoses:  1. Bilateral hip pain   2. Chronic bilateral low back pain with bilateral sciatica     Plan: Impression is chronic low back pain and bilateral hip pain concerning for new onset trochanteric bursitis.  Bilateral lateral hip pain seems to be most symptomatic today.  We discussed proceeding with trochanteric bursa injections and an IT band exercise program for which she is agreeable to.  She will follow-up with [place] as needed.  Follow-Up Instructions: Return if symptoms worsen or fail to improve.   Orders:  Orders Placed This Encounter  Procedures   Large Joint Inj   XR HIPS BILAT W OR W/O PELVIS 3-4 VIEWS   XR Lumbar Spine 2-3 Views   No orders of the defined types were placed in this encounter.     Procedures: Large Joint Inj: bilateral greater trochanter on 01/11/2023 9:31 PM Indications: pain Details: 22 G needle Medications (Right): 40 mg methylPREDNISolone acetate 40 MG/ML; 5 mL bupivacaine 0.5 % Medications (Left): 40 mg methylPREDNISolone acetate 40 MG/ML; 5 mL bupivacaine 0.5 % Outcome: tolerated well, no immediate complications Patient was prepped and draped in the usual sterile fashion.       Clinical Data: No additional findings.   Subjective: Chief Complaint  Patient presents with   Left Hip - Pain   Right Hip - Pain    HPI patient is a pleasant 57 year old female who comes in today with midline low back pain and bilateral hip pain right greater than left for the past 4 to 5 years.  She denies any injury or change in activity.  She denies any pain to the anterior thigh or groin although she does note numbness that occurs throughout the entire right thigh at times.  The  pain in her hips and back primarily occur when she is walking.  She denies any pain lying on either side.  She takes occasional ibuprofen without relief.  She has been to physical therapy for her back in the past which did not improve her symptoms.  She does have a history of scoliosis where rods were placed over 40 years ago.  She is not currently being followed by a back doctor.  Review of Systems as detailed in HPI.  All others reviewed and are negative.   Objective: Vital Signs: There were no vitals taken for this visit.  Physical Exam well-developed well-nourished female no acute distress.  Alert and oriented x 3.  Ortho Exam lumbar spine exam: Spinous tenderness to the lower lumbar level.  No paraspinous tenderness.  Increased pain with lumbar flexion and extension.  Positive straight leg raise on the right.  No focal weakness.  Bilateral hip exam: She has moderate tenderness to the right greater trochanter.  No tenderness to the left greater trochanter.  No pain with logroll, FADIR or Stinchfield testing.  She is neurovascularly intact distally.  Specialty Comments:  No specialty comments available.  Imaging: XR HIPS BILAT W OR W/O PELVIS 3-4 VIEWS  Result Date: 01/11/2023 X-rays demonstrate mild degenerative changes  XR Lumbar  Spine 2-3 Views  Result Date: 01/11/2023 Advanced degenerative changes L4-5 and L5-S1    PMFS History: Patient Active Problem List   Diagnosis Date Noted   Class 2 severe obesity due to excess calories with serious comorbidity and body mass index (BMI) of 37.0 to 37.9 in adult [facility]) 12/31/2022   Environmental and seasonal allergies 12/31/2022   Gastroesophageal reflux disease without esophagitis 12/31/2022   Tobacco dependence due to cigarettes 12/31/2022   Aortic atherosclerosis (HCC)    Suicide attempt (HCC) 02/12/2022   Major depressive disorder, recurrent severe without psychotic features (HCC) 02/12/2022   GAD (generalized anxiety disorder)  01/18/2022   Depression, major, single episode, moderate (HCC) 01/18/2022   Prediabetes 01/18/2022   Obstructive chronic bronchitis without exacerbation (HCC) 01/18/2022   Osteoarthritis 10/22/2021   Back pain 10/22/2021   Lumbar radicular pain 12/08/2019   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) 10/27/2019   Lumbar facet arthropathy 10/27/2019   Trochanteric bursitis of both hips 10/27/2019   Chronic pain syndrome 10/27/2019   Respiratory tract infection due to COVID-19 virus 03/27/2019   Cough 03/27/2019   Invasive carcinoma of breast (HCC) 03/24/2018   Goals of care, counseling/discussion 03/24/2018   Malignant neoplasm of right breast greater than or equal to 2 cm in greatest dimension [facility])    Mass of right breast 02/24/2018   Past Medical History:  Diagnosis Date   Allergy    Anxiety    Aortic atherosclerosis (HCC)    Arthritis    Breast cancer (HCC) 12/2017   right breast   COPD (chronic obstructive pulmonary disease) (HCC)    Depression    GERD (gastroesophageal reflux disease)    Personal history of radiation therapy    Prediabetes    Scoliosis    Sleep apnea    supposed to get CPAP 03/25/19    Family History  Problem Relation Age of Onset   Breast cancer Mother 61   Lung cancer Mother    Breast cancer Maternal Grandmother    Thyroid cancer Father     Past Surgical History:  Procedure Laterality Date   ABDOMINAL HYSTERECTOMY     BACK SURGERY     BREAST BIOPSY Right 12/25/2017   affirm bx , x clip,TUBULAR  CARCINOMA   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   BREAST LUMPECTOMY WITH NEEDLE LOCALIZATION Right 03/03/2018   Procedure: BREAST LUMPECTOMY WITH NEEDLE LOCALIZATION;  Surgeon: Ancil Linsey, MD;  Location: [facility] ORS;  Service: General;  Laterality: Right;   COLONOSCOPY WITH PROPOFOL N/A 08/31/2019   Procedure: COLONOSCOPY WITH PROPOFOL;  Surgeon: Wyline Mood, MD;  Location: [facility] ENDOSCOPY;  Service: Gastroenterology;  Laterality:  N/A;   SENTINEL NODE BIOPSY Right 03/21/2018   Procedure: RIGHT SENTINEL LYMPH  NODE BIOPSY;  Surgeon: Ancil Linsey, MD;  Location: [facility] ORS;  Service: General;  Laterality: Right;   Social History   Occupational History   Not on file  Tobacco Use   Smoking status: Every Day    Current packs/day: 1.50    Average packs/day: 1.5 packs/day for 42.0 years (63.0 ttl pk-yrs)    Types: Cigarettes, E-cigarettes   Smokeless tobacco: Never   Tobacco comments:    Smokes a pack and a half daily  Vaping Use   Vaping status: Former   Devices: used  2 weeks per pt  Substance and Sexual Activity   Alcohol use: No   Drug use: Yes    Types: Marijuana    Comment: 1  joint daily   Sexual activity: Not Currently

## 2023-01-15 ENCOUNTER — Inpatient Hospital Stay: Payer: Medicaid Other | Admitting: Oncology

## 2023-01-15 ENCOUNTER — Other Ambulatory Visit: Payer: Medicaid Other

## 2023-01-21 ENCOUNTER — Ambulatory Visit
Admission: RE | Admit: 2023-01-21 | Discharge: 2023-01-21 | Disposition: A | Discharge status: Home / Self Care | Payer: Medicaid Other | Source: Ambulatory Visit | Attending: Oncology | Admitting: Oncology

## 2023-01-21 DIAGNOSIS — Z1231 Encounter for screening mammogram for malignant neoplasm of breast: Secondary | ICD-10-CM | POA: Diagnosis present

## 2023-01-21 DIAGNOSIS — Z853 Personal history of malignant neoplasm of breast: Secondary | ICD-10-CM | POA: Insufficient documentation

## 2023-01-28 ENCOUNTER — Encounter: Payer: Self-pay | Admitting: Oncology

## 2023-01-28 ENCOUNTER — Inpatient Hospital Stay: Payer: Medicaid Other | Attending: Oncology | Admitting: Oncology

## 2023-02-25 ENCOUNTER — Other Ambulatory Visit: Payer: Medicaid Other

## 2023-03-04 ENCOUNTER — Ambulatory Visit: Payer: Medicaid Other | Admitting: Family Medicine

## 2023-03-04 ENCOUNTER — Encounter: Payer: Self-pay | Admitting: Family Medicine

## 2023-03-04 VITALS — BP 130/80 | HR 108 | Temp 98.2°F | Ht 62.0 in | Wt 201.0 lb

## 2023-03-04 DIAGNOSIS — Z0001 Encounter for general adult medical examination with abnormal findings: Secondary | ICD-10-CM

## 2023-03-04 DIAGNOSIS — F17210 Nicotine dependence, cigarettes, uncomplicated: Secondary | ICD-10-CM | POA: Diagnosis not present

## 2023-03-04 DIAGNOSIS — Z23 Encounter for immunization: Secondary | ICD-10-CM

## 2023-03-04 DIAGNOSIS — F321 Major depressive disorder, single episode, moderate: Secondary | ICD-10-CM

## 2023-03-04 DIAGNOSIS — N9489 Other specified conditions associated with female genital organs and menstrual cycle: Secondary | ICD-10-CM | POA: Diagnosis not present

## 2023-03-04 DIAGNOSIS — Z0000 Encounter for general adult medical examination without abnormal findings: Secondary | ICD-10-CM | POA: Insufficient documentation

## 2023-03-04 NOTE — Assessment & Plan Note (Signed)
Today your medical history was reviewed and routine physical exam was performed. Recommend 150 minutes of moderate intensity exercise weekly and consuming a well-balanced diet. Advised to stop smoking if a smoker, avoid smoking if a non-smoker, limit alcohol consumption to 1 drink per day for women and 2 drinks per day for men, and avoid illicit drug use. Counseled in mental health awareness and when to seek medical care. Vaccine maintenance discussed. Appropriate health maintenance items reviewed. Return to office in 1 year for annual physical exam.

## 2023-03-04 NOTE — Assessment & Plan Note (Addendum)
Benign lung CT 07/31/2022. 3-5 minute discussion regarding the harms of tobacco use, the benefits of cessation, and methods of cessation. Discussed that there are medication options to help with cessation. Provided printed education on steps to quit smoking. Patient is not ready to use a medication to help.

## 2023-03-04 NOTE — Assessment & Plan Note (Signed)
Approx 1-2cm left adnexal mass noted on bimanual exam, discussed with patient.  Will proceed with transvaginal ultrasound.

## 2023-03-04 NOTE — Assessment & Plan Note (Signed)
Chronic. Followed by Psychiatry, encouraged to follow up if symptoms are uncontrolled. Continue Cymbalta, Valium, Abilfy, Hydroxyzine, and Trazodone. No current SI/HI and she does have action plan if these occur.     03/04/2023    8:33 AM 12/31/2022   10:44 AM 03/14/2021    9:36 AM 12/09/2020    9:26 AM 09/08/2020    8:41 AM  Depression screen PHQ 2/9  Decreased Interest 2 3 0 0 1  Down, Depressed, Hopeless 2 3 1  0 1  PHQ - 2 Score 4 6 1  0 2  Altered sleeping 2 1   1   Tired, decreased energy 2 3   3   Change in appetite 2 2   3   Feeling bad or failure about yourself  2 2   1   Trouble concentrating 2 1   1   Moving slowly or fidgety/restless 2 0   0  Suicidal thoughts 1 1   0  PHQ-9 Score 17 16   11   Difficult doing work/chores Extremely dIfficult Somewhat difficult

## 2023-03-04 NOTE — Progress Notes (Signed)
Subjective:   Margaret Blackburn is a 58 y.o. female for annual routine Pap and checkup. Current Outpatient Medications  Medication Sig Dispense Refill   albuterol (VENTOLIN HFA) 108 (90 Base) MCG/ACT inhaler Inhale 2 puffs into the lungs every 6 (six) hours as needed for wheezing or shortness of breath. 18 g 5   anastrozole (ARIMIDEX) 1 MG tablet Take 1 tablet (1 mg total) by mouth daily. 30 tablet 4   ARIPiprazole (ABILIFY) 2 MG tablet Take 1 tablet (2 mg total) by mouth daily. antidepressant augmentation. 30 tablet 0   cetirizine (ZYRTEC) 10 MG tablet Take 1 tablet (10 mg total) by mouth daily. 30 tablet 11   diazepam (VALIUM) 5 MG tablet Take 5 mg by mouth every 6 (six) hours as needed for anxiety.     DULoxetine (CYMBALTA) 60 MG capsule Take 1 capsule (60 mg total) by mouth daily. For depression. 30 capsule 0   Fluticasone-Umeclidin-Vilant (TRELEGY ELLIPTA) 100-62.5-25 MCG/ACT AEPB Inhale 1 puff into the lungs daily. 1 each 11   hydrOXYzine (ATARAX) 25 MG tablet Take 1 tablet (25 mg total) by mouth 3 (three) times daily as needed for anxiety. 90 tablet 0   metFORMIN (GLUCOPHAGE) 500 MG tablet TAKE ONE TABLET BY MOUTH TWICE A DAY WITH A MEAL 180 tablet 3   mirtazapine (REMERON) 15 MG tablet Take 15 mg by mouth at bedtime.     Multiple Vitamins-Minerals (ALIVE WOMENS ENERGY PO) Take 1 Dose by mouth daily.     nicotine (NICODERM CQ - DOSED IN MG/24 HOURS) 21 mg/24hr patch Place 1 patch (21 mg total) onto the skin daily. (May buy from over the counter): For smoking cessation 28 patch 0   omeprazole (PRILOSEC) 20 MG capsule Take 20 mg by mouth daily.     pregabalin (LYRICA) 150 MG capsule Take 1 capsule (150 mg total) by mouth 2 (two) times daily. 60 capsule 2   rosuvastatin (CRESTOR) 5 MG tablet Take 1 tablet (5 mg total) by mouth daily. 90 tablet 3   traZODone (DESYREL) 50 MG tablet Take 1 tablet (50 mg total) by mouth at bedtime as needed for sleep. 30 tablet 0   No current  facility-administered medications for this visit.   Allergies: Penicillins  No LMP recorded. Patient has had a hysterectomy. 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   BREAST LUMPECTOMY WITH NEEDLE LOCALIZATION Right 03/03/2018   Procedure: BREAST LUMPECTOMY WITH NEEDLE LOCALIZATION;  Surgeon: Ancil Linsey, MD;  Location: [facility] ORS;  Service: General;  Laterality: Right;   COLONOSCOPY WITH PROPOFOL N/A 08/31/2019   Procedure: COLONOSCOPY WITH PROPOFOL;  Surgeon: Wyline Mood, MD;  Location: [facility] ENDOSCOPY;  Service: Gastroenterology;  Laterality: N/A;   SENTINEL NODE BIOPSY Right 03/21/2018   Procedure: RIGHT SENTINEL LYMPH  NODE BIOPSY;  Surgeon: Ancil Linsey, MD;  Location: [facility] ORS;  Service: General;  Laterality: Right;     ROS:  Feeling well. No dyspnea or chest pain on exertion.  No abdominal pain, change in bowel habits, black or bloody stools.  No urinary tract symptoms. GYN ROS: no breast pain or new or enlarging lumps on self exam, she complains of none, she is post partial hysterectomy. No neurological complaints.  Objective:   The patient appears well, alert, oriented x 3, in no distress. BP 130/80   Pulse (!) 108   Temp 98.2 F (36.8 C) (Oral)   Ht 5\' 2"  (1.575 m)   Wt 201 lb (91.2 kg)   SpO2 96%   BMI 36.76 kg/m  ENT normal.  Neck supple. No adenopathy or thyromegaly. PERLA. Lungs with expiratory  wheezing bilaterally. S1 and S2 normal, no murmurs, regular rate and rhythm. Abdomen soft without tenderness, guarding, mass or organomegaly. Extremities show no edema, normal peripheral pulses. Neurological is normal, no focal findings.  BREAST EXAM: patient declines to have breast exam  PELVIC EXAM: VULVA: normal appearing vulva with no masses, tenderness or lesions, VAGINA: normal appearing vagina with normal color and discharge, no lesions, ADNEXA: mass present left side, size 1 cm, exam chaperoned by Arta Silence, transvaginal US ordered  Assessment & Plan:   well woman  PLAN:  mammogram return annually or prn Transvaginal ultrasound    Physical exam, annual Assessment & Plan: Today your medical history was reviewed and routine physical exam was performed. Recommend 150 minutes of moderate intensity exercise weekly and consuming a well-balanced diet. Advised to stop smoking if a smoker, avoid smoking if a non-smoker, limit alcohol consumption to 1 drink per day for women and 2 drinks per day for men, and avoid illicit drug use. Counseled in mental health awareness and when to seek medical care. Vaccine maintenance discussed. Appropriate health maintenance items reviewed. Return to office in 1 year for annual physical exam.    Adnexal mass Assessment & Plan: Approx 1-2cm left adnexal mass noted on bimanual exam, discussed with patient.  Will proceed with transvaginal ultrasound.   Orders: -     US PELVIS TRANSVAGINAL NON-OB (TV ONLY); Future  Need for vaccination -     Varicella-zoster vaccine IM  Tobacco dependence due to cigarettes Assessment & Plan: Benign lung CT 07/31/2022. 3-5 minute discussion regarding the harms of tobacco use, the benefits of cessation, and methods of cessation. Discussed that there are medication options to help with cessation. Provided printed education on steps to quit smoking. Patient is not ready to use a medication to help.    Depression, major,  single episode, moderate (HCC) Assessment & Plan: Chronic. Followed by Psychiatry, encouraged to follow up if symptoms are uncontrolled. Continue Cymbalta, Valium, Abilfy, Hydroxyzine, and Trazodone. No current SI/HI and she does have action plan if these occur.     03/04/2023    8:33 AM 12/31/2022   10:44 AM 03/14/2021    9:36 AM 12/09/2020    9:26 AM 09/08/2020    8:41 AM  Depression screen PHQ 2/9  Decreased Interest 2 3 0 0 1  Down, Depressed, Hopeless 2 3 1  0 1  PHQ - 2 Score 4 6 1  0 2  Altered sleeping 2 1   1   Tired, decreased energy 2 3   3   Change in appetite 2 2   3   Feeling bad or failure about yourself  2 2   1   Trouble concentrating 2  1   1  Moving slowly or fidgety/restless 2 0   0  Suicidal thoughts 1 1   0  PHQ-9 Score 17 16   11   Difficult doing work/chores Extremely dIfficult Somewhat difficult             Follow up plan: Return in about 6 months (around 09/01/2023) for chronic follow-up with labs 1 week prior.  Park Meo, FNP

## 2023-03-05 ENCOUNTER — Ambulatory Visit
Admission: RE | Admit: 2023-03-05 | Discharge: 2023-03-05 | Disposition: A | Discharge status: Home / Self Care | Payer: Medicaid Other | Source: Ambulatory Visit | Attending: Family Medicine | Admitting: Family Medicine

## 2023-03-05 DIAGNOSIS — N9489 Other specified conditions associated with female genital organs and menstrual cycle: Secondary | ICD-10-CM

## 2023-03-12 ENCOUNTER — Other Ambulatory Visit: Payer: Self-pay | Admitting: Nurse Practitioner

## 2023-03-22 ENCOUNTER — Telehealth: Payer: Self-pay

## 2023-03-22 NOTE — Telephone Encounter (Signed)
Copied from CRM [id number]. Topic: General - Call Back - No Documentation >> Mar 22, 2023  8:59 AM Higinio Roger wrote: Reason for CRM: Toniann Fail from [facility] called to see if clinic has received fax request for incontinence supplies on 03/17/23. She would like a callback at [phone]

## 2023-03-22 NOTE — Telephone Encounter (Signed)
Chart notes put up front to be faxed to [facility] atten Toniann Fail

## 2023-03-26 ENCOUNTER — Telehealth: Payer: Self-pay

## 2023-03-26 NOTE — Telephone Encounter (Signed)
Called and LVM for pt re: fax from [facility] re incontinence supplies?

## 2023-04-24 ENCOUNTER — Encounter: Payer: Self-pay | Admitting: Family Medicine

## 2023-04-24 ENCOUNTER — Ambulatory Visit (INDEPENDENT_AMBULATORY_CARE_PROVIDER_SITE_OTHER): Admitting: Family Medicine

## 2023-04-24 VITALS — BP 122/78 | HR 111 | Temp 99.8°F | Ht 62.0 in | Wt 202.0 lb

## 2023-04-24 DIAGNOSIS — F17210 Nicotine dependence, cigarettes, uncomplicated: Secondary | ICD-10-CM

## 2023-04-24 DIAGNOSIS — J4489 Other specified chronic obstructive pulmonary disease: Secondary | ICD-10-CM

## 2023-04-24 DIAGNOSIS — J019 Acute sinusitis, unspecified: Secondary | ICD-10-CM | POA: Diagnosis not present

## 2023-04-24 DIAGNOSIS — R0982 Postnasal drip: Secondary | ICD-10-CM | POA: Insufficient documentation

## 2023-04-24 MED ORDER — AZELASTINE HCL 0.1 % NA SOLN: 2.0000 | Freq: Two times a day (BID) | NASAL | 12 refills | Status: AC

## 2023-04-24 MED ORDER — AZITHROMYCIN 250 MG PO TABS: ORAL_TABLET | ORAL | 0 refills | Status: DC

## 2023-04-24 MED ORDER — PREDNISONE 10 MG (21) PO TBPK: ORAL_TABLET | ORAL | 0 refills | Status: DC

## 2023-04-24 MED ORDER — ALBUTEROL SULFATE HFA 108 (90 BASE) MCG/ACT IN AERS: 2.0000 | INHALATION_SPRAY | Freq: Four times a day (QID) | RESPIRATORY_TRACT | 5 refills | Status: AC | PRN

## 2023-04-24 NOTE — Progress Notes (Signed)
 Patient Office Visit  Assessment & Plan:  Acute sinusitis, recurrence not specified, unspecified location -     Azithromycin; Take 2 tablets (500 mg) PO today, then 1 tablet (250 mg) PO daily x4 days.  Dispense: 6 tablet; Refill: 0 -     predniSONE; Use as directed.  Dispense: 21 each; Refill: 0  Obstructive chronic bronchitis without exacerbation (HCC) -     Albuterol Sulfate HFA; Inhale 2 puffs into the lungs every 6 (six) hours as needed for wheezing or shortness of breath.  Dispense: 18 g; Refill: 5  Postnasal drip -     Azelastine HCl; Place 2 sprays into both nostrils 2 (two) times daily. Use in each nostril as directed  Dispense: 30 mL; Refill: 12  Tobacco dependence due to cigarettes   Medications sent to the pharmacy.  Patient will stopped taking the Sudafed.  If no improvement or worsening symptoms she is to notify us.  Recommend tobacco cessation. No follow-ups on file.   Subjective:    Patient ID: Margaret Blackburn, female    DOB: 14-Jun-1965  Age: 58 y.o. MRN: [id number]  Chief Complaint  Patient presents with   Sinus Problem    X 2 weeks. Productive cough.    Sinus Problem   Sinusitis- pt has a 2 week hsitory of congestion, sinus pressure, coughing, wheezing x 2 weeks. Subjective fever but no chills. Pt smokes about 1 ppd Tried OTC pseudofed, mucinex ER, Mucinex for cough/congestion, zyrtec, albuterol inhaler and nasal astelin spray but are not helping too much.   Patient has been visiting her brother at the hospital and does not know if she got something there.  Patient did not take her medications this morning.  Heart is racing a bit but patient feels it is also due to anxiety. Postnasal drip/allergies-patient does need a refill of Astelin nasal spray.  Patient has also been taking the Zyrtec over-the-counter which does help. COPD-Pt is prone to getting infections this time of the year. Patient does use a Trelegy inhaler and albuterol inhaler as needed.  Patient has  been using the albuterol more in the last few days.  Patient has had intermittent wheezing and occasional shortness of breath.  Patient continues to smoke 1 pack a day and is not motivated to quit at this time.  Does need a refill of albuterol inhaler.  The 10-year ASCVD risk score (Arnett DK, et al., 2019) is: 4.3%  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   BREAST LUMPECTOMY WITH NEEDLE LOCALIZATION Right 03/03/2018   Procedure: BREAST LUMPECTOMY WITH NEEDLE LOCALIZATION;  Surgeon: Ancil Linsey, MD;  Location: [facility] ORS;  Service: General;  Laterality: Right;   COLONOSCOPY WITH PROPOFOL N/A 08/31/2019   Procedure: COLONOSCOPY WITH PROPOFOL;  Surgeon: Wyline Mood, MD;  Location:  [facility] ENDOSCOPY;  Service: Gastroenterology;  Laterality: N/A;   SENTINEL NODE BIOPSY Right 03/21/2018   Procedure: RIGHT SENTINEL LYMPH  NODE BIOPSY;  Surgeon: Ancil Linsey, MD;  Location: [facility] ORS;  Service: General;  Laterality: Right;   Social History   Tobacco Use   Smoking status: Every Day    Current packs/day: 1.50    Average packs/day: 1.5 packs/day for 42.0 years (63.0 ttl pk-yrs)    Types: Cigarettes, E-cigarettes   Smokeless tobacco: Never   Tobacco comments:    Smokes a pack and a half daily  Vaping Use   Vaping status: Former   Devices: used  2 weeks per pt  Substance Use Topics   Alcohol use: No   Drug use: Yes    Types:  Marijuana    Comment: 1 joint daily   Family History  Problem Relation Age of Onset   Breast cancer Mother 71   Lung cancer Mother    Thyroid cancer Father    Cirrhosis Brother    Breast cancer Maternal Grandmother    Allergies  Allergen Reactions   Penicillins Rash and Other (See Comments)    DID THE REACTION INVOLVE: Swelling of the face/tongue/throat, SOB, or low BP? No Sudden or severe rash/hives, skin peeling, or the inside of the mouth or nose? Yes, blisters. Not immediate. Did it require medical treatment? Yes When did it last happen? 40s If all above answers are "NO", may proceed with cephalosporin use.     ROS    Objective:    BP 122/78   Pulse (!) 111   Temp 99.8 F (37.7 C)   Ht 5\' 2"  (1.575 m)   Wt 202 lb (91.6 kg)   SpO2 98%   BMI 36.95 kg/m  BP Readings from Last 3 Encounters:  04/24/23 122/78  03/04/23 130/80  12/31/22 100/80   Wt Readings from Last 3 Encounters:  04/24/23 202 lb (91.6 kg)  03/04/23 201 lb (91.2 kg)  12/31/22 207 lb (93.9 kg)    Physical Exam Vitals and nursing note reviewed.  Constitutional:      General: She is not in acute distress.    Appearance: Normal appearance.  HENT:     Head: Normocephalic.     Right Ear: Tympanic membrane, ear canal and external ear normal.     Left Ear: Tympanic membrane, ear canal and external ear normal.     Nose: Congestion present.     Right Sinus: Maxillary sinus tenderness present.     Left Sinus: Maxillary sinus tenderness present.     Mouth/Throat:     Pharynx: No oropharyngeal exudate or posterior oropharyngeal erythema.  Eyes:     Extraocular Movements: Extraocular movements intact.     Pupils: Pupils are equal, round, and reactive to light.  Cardiovascular:     Rate and Rhythm: Regular rhythm. Tachycardia present.     Heart sounds: Normal heart sounds.  Pulmonary:     Effort: Pulmonary effort is normal.     Breath sounds: Wheezing and rhonchi present.     Comments: Coarse  rhonchi left lower lobe, expiratory wheezes anteriorly Neurological:     General: No focal deficit present.     Mental Status: She is alert and oriented to person, place, and time.  Psychiatric:        Mood and Affect: Mood normal.        Behavior: Behavior normal.      No results found for any visits on 04/24/23.

## 2023-04-30 ENCOUNTER — Ambulatory Visit: Payer: Self-pay

## 2023-04-30 NOTE — Telephone Encounter (Signed)
  Chief Complaint: productive cough Symptoms: cough, clear phlegm,  Frequency: about 3 weeks Pertinent Negatives: Patient denies worsening SOB/difficulty breathing, worsening wheezing than normal, fever, travel,  Disposition: [] ED /[] Urgent Care (no appt availability in office) / [x] Appointment(In office/virtual)/ []  [place] [facility]/ [] Home Care/ [] Refused Recommended Disposition /[] [place] [facility]/ []  Follow-up with PCP Additional Notes: Pt states that she was in last week to see PCP. Pt took medications as prescribed but states that she thinks she needs more. Pt with productive cough, clear phlegm. Pt stats that she is a smoker. Pt denies fever. Sched for tomorrow, advised fo care instructions per Epic, pt agreeable and understands.  Copied from CRM [id number]. Topic: Clinical - Medication Question >> Apr 30, 2023 11:31 AM [place] B wrote: Reason for CRM: pt called in states still has cough, and mild headache,after taking antibiotics. She saw Dr Dimas Aguas just last week, so she is asking for another round of antibiotics Reason for Disposition  Cough has been present for > 3 weeks  Answer Assessment - Initial Assessment Questions 1. ONSET: "When did the cough begin?"      About 3 2. SEVERITY: "How bad is the cough today?"      moderate 3. SPUTUM: "Describe the color of your sputum" (none, dry cough; clear, white, yellow, green)     clear 4. HEMOPTYSIS: "Are you coughing up any blood?" If so ask: "How much?" (flecks, streaks, tablespoons, etc.)     denies 5. DIFFICULTY BREATHING: "Are you having difficulty breathing?" If Yes, ask: "How bad is it?" (e.g., mild, moderate, severe)    - MILD: No SOB at rest, mild SOB with walking, speaks normally in sentences, can lie down, no retractions, pulse < 100.    - MODERATE: SOB at rest, SOB with minimal exertion and prefers to sit, cannot lie down flat, speaks in phrases, mild retractions, audible wheezing, pulse 100-120.    - SEVERE: Very  SOB at rest, speaks in single words, struggling to breathe, sitting hunched forward, retractions, pulse > 120      Same SOB as usual. 6. FEVER: "Do you have a fever?" If Yes, ask: "What is your temperature, how was it measured, and when did it start?"     denies 7. CARDIAC HISTORY: "Do you have any history of heart disease?" (e.g., heart attack, congestive heart failure)      denies 8. LUNG HISTORY: "Do you have any history of lung disease?"  (e.g., pulmonary embolus, asthma, emphysema)     Emphysema, copd 9. PE RISK FACTORS: "Do you have a history of blood clots?" (or: recent major surgery, recent prolonged travel, bedridden)     denies 10. OTHER SYMPTOMS: "Do you have any other symptoms?" (e.g., runny nose, wheezing, chest pain)       Wheezing-same as usual, not worse than normal, nose congestion 12. TRAVEL: "Have you traveled out of the country in the last month?" (e.g., travel history, exposures)       denies  Protocols used: Cough - Acute Productive-A-AH

## 2023-05-01 ENCOUNTER — Encounter: Payer: Self-pay | Admitting: Family Medicine

## 2023-05-01 ENCOUNTER — Ambulatory Visit: Admitting: Family Medicine

## 2023-05-01 VITALS — BP 122/80 | HR 101 | Temp 98.5°F | Ht 62.0 in | Wt 209.0 lb

## 2023-05-01 DIAGNOSIS — J019 Acute sinusitis, unspecified: Secondary | ICD-10-CM

## 2023-05-01 DIAGNOSIS — J4489 Other specified chronic obstructive pulmonary disease: Secondary | ICD-10-CM

## 2023-05-01 DIAGNOSIS — F17210 Nicotine dependence, cigarettes, uncomplicated: Secondary | ICD-10-CM

## 2023-05-01 MED ORDER — DOXYCYCLINE HYCLATE 100 MG PO TABS: 100.0000 mg | ORAL_TABLET | Freq: Two times a day (BID) | ORAL | 0 refills | Status: DC

## 2023-05-01 MED ORDER — FLUTICASONE PROPIONATE 50 MCG/ACT NA SUSP: 2.0000 | Freq: Every day | NASAL | 1 refills | Status: AC

## 2023-05-01 MED ORDER — PREDNISONE 10 MG (21) PO TBPK: ORAL_TABLET | ORAL | 0 refills | Status: DC

## 2023-05-01 NOTE — Progress Notes (Signed)
 Patient Office Visit  Assessment & Plan:  Acute sinusitis, recurrence not specified, unspecified location -     Fluticasone Propionate; Place 2 sprays into both nostrils daily.  Dispense: 16 g; Refill: 1 -     Doxycycline Hyclate; Take 1 tablet (100 mg total) by mouth 2 (two) times daily.  Dispense: 10 tablet; Refill: 0 -     predniSONE; Use as directed.  Dispense: 21 each; Refill: 0  Tobacco dependence due to cigarettes  Obstructive chronic bronchitis without exacerbation (HCC)       No follow-ups on file.   Subjective:    Patient ID: Margaret Blackburn, female    DOB: 12-27-1965  Age: 58 y.o. MRN: [id number]  Chief Complaint  Patient presents with   Cough    Cough, congestion and wheezing x 2 weeks.     Cough   Sinusitis- pt was seen last week by me and got Z pack, astelin nasal spray, steroids and albuterol. Is feeling slightly better except for ongoing sinus pressure. Does not feel a real improvement with sinus pressure and coughing seems to be worse at night time. Pt thinks the sinus pressure is worse at this time despite steroids, astelin and allery med.   Pt still wheezing, using inhaler BID which does help. Pt still smoking.  No fever or chills. Symptoms Going on for 2 weeks. Pt has hx of COPD  The 10-year ASCVD risk score (Arnett DK, et al., 2019) is: 4.3%  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    BREAST LUMPECTOMY WITH NEEDLE LOCALIZATION Right 03/03/2018   Procedure: BREAST LUMPECTOMY WITH NEEDLE LOCALIZATION;  Surgeon: Ancil Linsey, MD;  Location: [facility] ORS;  Service: General;  Laterality: Right;   COLONOSCOPY WITH PROPOFOL N/A 08/31/2019   Procedure: COLONOSCOPY WITH PROPOFOL;  Surgeon: Wyline Mood, MD;  Location: [facility] ENDOSCOPY;  Service: Gastroenterology;  Laterality: N/A;   SENTINEL NODE BIOPSY Right 03/21/2018   Procedure: RIGHT SENTINEL LYMPH  NODE BIOPSY;  Surgeon: Ancil Linsey, MD;  Location: [facility] ORS;  Service: General;  Laterality: Right;   Social History   Tobacco Use   Smoking status: Every Day    Current packs/day: 1.50    Average packs/day: 1.5 packs/day for 42.0 years (63.0 ttl pk-yrs)    Types: Cigarettes, E-cigarettes   Smokeless tobacco: Never   Tobacco comments:    Smokes a pack and a half daily  Vaping Use   Vaping status: Former   Devices: used  2 weeks per pt  Substance Use Topics   Alcohol use: No   Drug use: Yes    Types: Marijuana    Comment: 1 joint daily   Family History  Problem Relation Age of Onset   Breast cancer Mother 77   Lung cancer Mother  Thyroid cancer Father    Cirrhosis Brother    Breast cancer Maternal Grandmother    Allergies  Allergen Reactions   Penicillins Rash and Other (See Comments)    DID THE REACTION INVOLVE: Swelling of the face/tongue/throat, SOB, or low BP? No Sudden or severe rash/hives, skin peeling, or the inside of the mouth or nose? Yes, blisters. Not immediate. Did it require medical treatment? Yes When did it last happen? 40s If all above answers are "NO", may proceed with cephalosporin use.     Review of Systems  Respiratory:  Positive for cough.       Objective:    BP 122/80   Pulse (!) 101   Temp 98.5 F (36.9 C)   Ht 5\' 2"  (1.575 m)   Wt 209 lb (94.8 kg)   SpO2 99%   BMI 38.23 kg/m  BP Readings from Last 3 Encounters:  05/01/23 122/80  04/24/23 122/78  03/04/23 130/80    Wt Readings from Last 3 Encounters:  05/01/23 209 lb (94.8 kg)  04/24/23 202 lb (91.6 kg)  03/04/23 201 lb (91.2 kg)    Physical Exam Vitals and nursing note reviewed.  Constitutional:      General: She is not in acute distress.    Appearance: Normal appearance.     Comments: Patient does appear better compared to last office visit.   HENT:     Head: Normocephalic.     Right Ear: Tympanic membrane and ear canal normal.     Left Ear: Tympanic membrane and ear canal normal.     Nose: Congestion present.     Right Sinus: Maxillary sinus tenderness present.     Left Sinus: Maxillary sinus tenderness present.     Mouth/Throat:     Pharynx: No oropharyngeal exudate or posterior oropharyngeal erythema.  Eyes:     Extraocular Movements: Extraocular movements intact.     Pupils: Pupils are equal, round, and reactive to light.  Cardiovascular:     Rate and Rhythm: Regular rhythm. Tachycardia present.     Heart sounds: Normal heart sounds.  Pulmonary:     Effort: Pulmonary effort is normal.     Breath sounds: Wheezing and rhonchi present.  Musculoskeletal:     Right lower leg: No edema.     Left lower leg: No edema.  Neurological:     General: No focal deficit present.     Mental Status: She is alert and oriented to person, place, and time.  Psychiatric:        Mood and Affect: Mood normal.        Behavior: Behavior normal.        Thought Content: Thought content normal.      No results found for any visits on 05/01/23.

## 2023-06-27 ENCOUNTER — Other Ambulatory Visit

## 2023-07-08 ENCOUNTER — Other Ambulatory Visit (HOSPITAL_COMMUNITY): Payer: Self-pay

## 2023-08-06 ENCOUNTER — Other Ambulatory Visit: Payer: Self-pay | Admitting: Acute Care

## 2023-08-06 ENCOUNTER — Ambulatory Visit: Admitting: Orthopaedic Surgery

## 2023-08-06 DIAGNOSIS — Z87891 Personal history of nicotine dependence: Secondary | ICD-10-CM

## 2023-08-06 DIAGNOSIS — Z122 Encounter for screening for malignant neoplasm of respiratory organs: Secondary | ICD-10-CM

## 2023-08-06 DIAGNOSIS — M25552 Pain in left hip: Secondary | ICD-10-CM | POA: Diagnosis not present

## 2023-08-06 DIAGNOSIS — M25551 Pain in right hip: Secondary | ICD-10-CM

## 2023-08-06 DIAGNOSIS — F17210 Nicotine dependence, cigarettes, uncomplicated: Secondary | ICD-10-CM

## 2023-08-06 MED ORDER — METHYLPREDNISOLONE ACETATE 40 MG/ML IJ SUSP
40.0000 mg | INTRAMUSCULAR | Status: AC | PRN
  Administered 2023-08-06: 40 mg via INTRA_ARTICULAR

## 2023-08-06 MED ORDER — LIDOCAINE HCL 1 % IJ SOLN
5.0000 mL | INTRAMUSCULAR | Status: AC | PRN
  Administered 2023-08-06: 5 mL

## 2023-08-06 MED ORDER — BUPIVACAINE HCL 0.5 % IJ SOLN
5.0000 mL | INTRAMUSCULAR | Status: AC | PRN
  Administered 2023-08-06: 5 mL via INTRA_ARTICULAR

## 2023-08-06 NOTE — Progress Notes (Signed)
 Office Visit Note   Patient: Margaret Blackburn           Date of Birth: Aug 02, 1965           MRN: [id number] Visit Date: 08/06/2023              Requested by: Kayla Jeoffrey RAMAN, FNP [address] [place],  [place] [address] PCP: Kayla Jeoffrey RAMAN, FNP   Assessment & Plan: Visit Diagnoses:  1. Bilateral hip pain     Plan: History of Present Illness Margaret Blackburn is a 58 year old female who presents with bilateral hip pain for cortisone injections.  She experiences bilateral hip pain that initially improved with cortisone injections administered in December. The relief lasted until recently, but the pain has recurred, prompting her to seek another round of injections. The pain is persistent and affects her daily activities. Exercises provide minimal relief but do not prevent the recurrence of symptoms.  Physical Exam MUSCULOSKELETAL: Tenderness in bilateral trochanteric regions.  Assessment and Plan Chronic bilateral trochanteric hip pain Chronic bilateral trochanteric hip pain recurring since December, minimally relieved by exercises, previously responsive to cortisone injections. - Administer bilateral trochanteric cortisone injections for pain relief.  Follow-Up Instructions: No follow-ups on file.   Orders:  Orders Placed This Encounter  Procedures   Large Joint Inj   No orders of the defined types were placed in this encounter.     Procedures: Large Joint Inj: bilateral greater trochanter on 08/06/2023 12:21 PM Indications: pain Details: 22 G needle Medications (Right): 40 mg methylPREDNISolone  acetate 40 MG/ML; 5 mL lidocaine  1 %; 5 mL bupivacaine  0.5 % Medications (Left): 40 mg methylPREDNISolone  acetate 40 MG/ML; 5 mL lidocaine  1 %; 5 mL bupivacaine  0.5 % Outcome: tolerated well, no immediate complications Patient was prepped and draped in the usual sterile fashion.       Clinical Data: No additional findings.   Subjective: Chief Complaint  Patient  presents with   Right Hip - Pain   Left Hip - Pain    HPI  Review of Systems  Constitutional: Negative.   HENT: Negative.    Eyes: Negative.   Respiratory: Negative.    Cardiovascular: Negative.   Endocrine: Negative.   Musculoskeletal: Negative.   Neurological: Negative.   Hematological: Negative.   Psychiatric/Behavioral: Negative.    All other systems reviewed and are negative.    Objective: Vital Signs: There were no vitals taken for this visit.  Physical Exam Vitals and nursing note reviewed.  Constitutional:      Appearance: She is well-developed.  HENT:     Head: Atraumatic.     Nose: Nose normal.   Eyes:     Extraocular Movements: Extraocular movements intact.    Cardiovascular:     Pulses: Normal pulses.  Pulmonary:     Effort: Pulmonary effort is normal.  Abdominal:     Palpations: Abdomen is soft.   Musculoskeletal:     Cervical back: Neck supple.   Skin:    General: Skin is warm.     Capillary Refill: Capillary refill takes less than 2 seconds.   Neurological:     Mental Status: She is alert. Mental status is at baseline.   Psychiatric:        Behavior: Behavior normal.        Thought Content: Thought content normal.        Judgment: Judgment normal.     Ortho Exam  Specialty Comments:  No specialty  comments available.  Imaging: No results found.   PMFS History: Patient Active Problem List   Diagnosis Date Noted   Acute sinusitis 04/24/2023   Postnasal drip 04/24/2023   Adnexal mass 03/04/2023   Physical exam, annual 03/04/2023   Class 2 severe obesity due to excess calories with serious comorbidity and body mass index (BMI) of 37.0 to 37.9 in adult [facility]) 12/31/2022   Environmental and seasonal allergies 12/31/2022   Gastroesophageal reflux disease without esophagitis 12/31/2022   Tobacco dependence due to cigarettes 12/31/2022   Aortic atherosclerosis (HCC)    Suicide attempt (HCC) 02/12/2022   Major depressive disorder,  recurrent severe without psychotic features (HCC) 02/12/2022   GAD (generalized anxiety disorder) 01/18/2022   Depression, major, single episode, moderate (HCC) 01/18/2022   Prediabetes 01/18/2022   Obstructive chronic bronchitis without exacerbation (HCC) 01/18/2022   Osteoarthritis 10/22/2021   Back pain 10/22/2021   Lumbar radicular pain 12/08/2019   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) 10/27/2019   Lumbar facet arthropathy 10/27/2019   Trochanteric bursitis of both hips 10/27/2019   Chronic pain syndrome 10/27/2019   Respiratory tract infection due to COVID-19 virus 03/27/2019   Invasive carcinoma of breast (HCC) 03/24/2018   Goals of care, counseling/discussion 03/24/2018   Malignant neoplasm of right breast greater than or equal to 2 cm in greatest dimension [facility])    Mass of right breast 02/24/2018   Past Medical History:  Diagnosis Date   Allergy    Anxiety    Aortic atherosclerosis (HCC)    Arthritis    Breast cancer (HCC) 12/2017   right breast   COPD (chronic obstructive pulmonary disease) (HCC)    Depression    GERD (gastroesophageal reflux disease)    Personal history of radiation therapy    Prediabetes    Scoliosis    Sleep apnea    supposed to get CPAP 03/25/19    Family History  Problem Relation Age of Onset   Breast cancer Mother 38   Lung cancer Mother    Thyroid cancer Father    Cirrhosis Brother    Breast cancer Maternal Grandmother     Past Surgical History:  Procedure Laterality Date   ABDOMINAL HYSTERECTOMY     BACK SURGERY     BREAST BIOPSY Right 12/25/2017   affirm bx , x clip,TUBULAR  CARCINOMA   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   BREAST LUMPECTOMY WITH NEEDLE LOCALIZATION Right 03/03/2018   Procedure: BREAST LUMPECTOMY WITH NEEDLE LOCALIZATION;  Surgeon: Nicholaus Selinda Birmingham, MD;  Location: [facility] ORS;  Service: General;  Laterality: Right;   COLONOSCOPY WITH PROPOFOL  N/A 08/31/2019   Procedure: COLONOSCOPY WITH  PROPOFOL ;  Surgeon: Therisa Bi, MD;  Location[facility] ENDOSCOPY;  Service: Gastroenterology;  Laterality: N/A;   SENTINEL NODE BIOPSY Right 03/21/2018   Procedure: RIGHT SENTINEL LYMPH  NODE BIOPSY;  Surgeon: Nicholaus Selinda Birmingham, MD;  Location: [facility] ORS;  Service: General;  Laterality: Right;   Social History   Occupational History   Not on file  Tobacco Use   Smoking status: Every Day    Current packs/day: 1.50    Average packs/day: 1.5 packs/day for 42.0 years (63.0 ttl pk-yrs)    Types: Cigarettes, E-cigarettes   Smokeless tobacco: Never   Tobacco comments:    Smokes a pack and a half daily  Vaping Use   Vaping status: Former   Devices: used  2 weeks per pt  Substance and Sexual Activity   Alcohol use: No  Drug use: Yes    Types: Marijuana    Comment: 1 joint daily   Sexual activity: Not Currently

## 2023-08-22 ENCOUNTER — Ambulatory Visit

## 2023-08-26 ENCOUNTER — Other Ambulatory Visit: Payer: Medicaid Other

## 2023-08-26 DIAGNOSIS — F17210 Nicotine dependence, cigarettes, uncomplicated: Secondary | ICD-10-CM

## 2023-08-26 DIAGNOSIS — E66812 Obesity, class 2: Secondary | ICD-10-CM

## 2023-08-26 DIAGNOSIS — R7303 Prediabetes: Secondary | ICD-10-CM

## 2023-08-26 DIAGNOSIS — I700 Atherosclerosis of aorta: Secondary | ICD-10-CM

## 2023-08-27 ENCOUNTER — Ambulatory Visit: Payer: Self-pay | Admitting: Family Medicine

## 2023-08-27 ENCOUNTER — Ambulatory Visit
Admission: RE | Admit: 2023-08-27 | Discharge: 2023-08-27 | Disposition: A | Discharge status: Home / Self Care | Source: Ambulatory Visit | Attending: Acute Care | Admitting: Acute Care

## 2023-08-27 DIAGNOSIS — Z122 Encounter for screening for malignant neoplasm of respiratory organs: Secondary | ICD-10-CM | POA: Insufficient documentation

## 2023-08-27 DIAGNOSIS — Z87891 Personal history of nicotine dependence: Secondary | ICD-10-CM | POA: Insufficient documentation

## 2023-08-27 DIAGNOSIS — F17210 Nicotine dependence, cigarettes, uncomplicated: Secondary | ICD-10-CM | POA: Diagnosis present

## 2023-08-27 LAB — COMPREHENSIVE METABOLIC PANEL WITH GFR
AG Ratio: 1.7 (calc) (ref 1.0–2.5)
ALT: 14 U/L (ref 6–29)
AST: 11 U/L (ref 10–35)
Albumin: 4 g/dL (ref 3.6–5.1)
Alkaline phosphatase (APISO): 56 U/L (ref 37–153)
BUN: 9 mg/dL (ref 7–25)
CO2: 28 mmol/L (ref 20–32)
Calcium: 9 mg/dL (ref 8.6–10.4)
Chloride: 105 mmol/L (ref 98–110)
Creat: 0.57 mg/dL (ref 0.50–1.03)
Globulin: 2.4 g/dL (ref 1.9–3.7)
Glucose, Bld: 120 mg/dL — ABNORMAL HIGH (ref 65–99)
Potassium: 4 mmol/L (ref 3.5–5.3)
Sodium: 141 mmol/L (ref 135–146)
Total Bilirubin: 0.5 mg/dL (ref 0.2–1.2)
Total Protein: 6.4 g/dL (ref 6.1–8.1)
eGFR: 107 mL/min/1.73m2 (ref >=60)

## 2023-08-27 LAB — HEMOGLOBIN A1C
Hgb A1c MFr Bld: 6.9 % — ABNORMAL HIGH (ref <5.7)
Mean Plasma Glucose: 151 mg/dL
eAG (mmol/L): 8.4 mmol/L

## 2023-08-27 LAB — CBC WITH DIFFERENTIAL/PLATELET
Absolute Lymphocytes: 1775 {cells}/uL (ref 850–3900)
Absolute Monocytes: 497 {cells}/uL (ref 200–950)
Basophils Absolute: 64 {cells}/uL (ref 0–200)
Basophils Relative: 0.9 %
Eosinophils Absolute: 192 {cells}/uL (ref 15–500)
Eosinophils Relative: 2.7 %
HCT: 46.2 % — ABNORMAL HIGH (ref 35.0–45.0)
Hemoglobin: 15.4 g/dL (ref 11.7–15.5)
MCH: 30.8 pg (ref 27.0–33.0)
MCHC: 33.3 g/dL (ref 32.0–36.0)
MCV: 92.4 fL (ref 80.0–100.0)
MPV: 10.3 fL (ref 7.5–12.5)
Monocytes Relative: 7 %
Neutro Abs: 4572 {cells}/uL (ref 1500–7800)
Neutrophils Relative %: 64.4 %
Platelets: 262 Thousand/uL (ref 140–400)
RBC: 5 Million/uL (ref 3.80–5.10)
RDW: 13.2 % (ref 11.0–15.0)
Total Lymphocyte: 25 %
WBC: 7.1 Thousand/uL (ref 3.8–10.8)

## 2023-08-27 LAB — LIPID PANEL
Cholesterol: 191 mg/dL (ref <200)
HDL: 48 mg/dL — ABNORMAL LOW (ref >=50)
LDL Cholesterol (Calc): 114 mg/dL — ABNORMAL HIGH
Non-HDL Cholesterol (Calc): 143 mg/dL — ABNORMAL HIGH (ref <130)
Total CHOL/HDL Ratio: 4 (calc) (ref <5.0)
Triglycerides: 171 mg/dL — ABNORMAL HIGH (ref <150)

## 2023-09-02 ENCOUNTER — Ambulatory Visit: Payer: Medicaid Other | Admitting: Family Medicine

## 2023-09-02 VITALS — BP 124/76 | HR 79 | Temp 97.9°F | Ht 62.0 in | Wt 185.2 lb

## 2023-09-02 DIAGNOSIS — F17210 Nicotine dependence, cigarettes, uncomplicated: Secondary | ICD-10-CM | POA: Diagnosis not present

## 2023-09-02 DIAGNOSIS — I700 Atherosclerosis of aorta: Secondary | ICD-10-CM

## 2023-09-02 DIAGNOSIS — Z6837 Body mass index (BMI) 37.0-37.9, adult: Secondary | ICD-10-CM

## 2023-09-02 DIAGNOSIS — E782 Mixed hyperlipidemia: Secondary | ICD-10-CM

## 2023-09-02 DIAGNOSIS — Z7984 Long term (current) use of oral hypoglycemic drugs: Secondary | ICD-10-CM

## 2023-09-02 DIAGNOSIS — F411 Generalized anxiety disorder: Secondary | ICD-10-CM | POA: Diagnosis not present

## 2023-09-02 DIAGNOSIS — E66812 Obesity, class 2: Secondary | ICD-10-CM

## 2023-09-02 DIAGNOSIS — F321 Major depressive disorder, single episode, moderate: Secondary | ICD-10-CM

## 2023-09-02 DIAGNOSIS — E119 Type 2 diabetes mellitus without complications: Secondary | ICD-10-CM

## 2023-09-02 MED ORDER — VARENICLINE TARTRATE (STARTER) 0.5 MG X 11 & 1 MG X 42 PO TBPK: ORAL_TABLET | ORAL | 0 refills | Status: DC

## 2023-09-02 MED ORDER — ROSUVASTATIN CALCIUM 10 MG PO TABS
10.0000 mg | ORAL_TABLET | Freq: Every day | ORAL | 1 refills | Status: AC
Start: 2023-09-02 — End: ?

## 2023-09-02 NOTE — Assessment & Plan Note (Signed)
 Cholesterol not at goal, restart Rosuvastatin  at 10mg  daily.  I recommend consuming a heart healthy diet such as Mediterranean diet or DASH diet with whole grains, fruits, vegetable, fish, lean meats, nuts, and olive oil. Limit sweets and processed foods. I also encourage moderate intensity exercise 150 minutes weekly. This is 3-5 times weekly for 30-50 minutes each session. Goal should be pace of 3 miles/hours, or walking 1.5 miles in 30 minutes. The 10-year ASCVD risk score (Arnett DK, et al., 2019) is: 9.9% Lipid Panel     Component Value Date/Time   CHOL 191 08/26/2023 0759   CHOL 154 12/05/2021 1053   TRIG 171 (H) 08/26/2023 0759   HDL 48 (L) 08/26/2023 0759   HDL 51 12/05/2021 1053   CHOLHDL 4.0 08/26/2023 0759   LDLCALC 114 (H) 08/26/2023 0759   LABVLDL 17 12/05/2021 1053

## 2023-09-02 NOTE — Assessment & Plan Note (Signed)
 Benign lung CT 07/31/2022. 3-5 minute discussion regarding the harms of tobacco use, the benefits of cessation, and methods of cessation. Discussed that there are medication options to help with cessation. Provided printed education on steps to quit smoking. Patient is ready to use a medication to help. Start Chantix .

## 2023-09-02 NOTE — Assessment & Plan Note (Signed)
 Chronic. Followed by Psychiatry, encouraged to follow up if symptoms are uncontrolled. Continue Cymbalta , Valium, Abilfy, Hydroxyzine , and Trazodone . No current SI/HI and she does have action plan if these occur.     09/02/2023    8:10 AM 05/01/2023    9:11 AM 04/24/2023    9:58 AM 03/04/2023    8:33 AM 12/31/2022   10:44 AM  Depression screen PHQ 2/9  Decreased Interest 1 0 0 2 3  Down, Depressed, Hopeless 1 0 0 2 3  PHQ - 2 Score 2 0 0 4 6  Altered sleeping 0 0 1 2 1   Tired, decreased energy 1 0 1 2 3   Change in appetite 1 0 0 2 2  Feeling bad or failure about yourself  1 0 0 2 2  Trouble concentrating 0 0 0 2 1  Moving slowly or fidgety/restless 0 0 0 2 0  Suicidal thoughts 0 0 0 1 1  PHQ-9 Score 5 0 2 17 16   Difficult doing work/chores  Not difficult at all Not difficult at all Extremely dIfficult Somewhat difficult

## 2023-09-02 NOTE — Assessment & Plan Note (Signed)
 Chronic. Well controlled on current regimen. Continue Cymbalta , Valium, Abilfy, Hydroxyzine , and Trazodone . No current SI/HI and she does have action plan if these occur.     09/02/2023    8:11 AM 05/01/2023    9:11 AM 04/24/2023    9:58 AM 03/04/2023    8:38 AM  GAD 7 : Generalized Anxiety Score  Nervous, Anxious, on Edge 1 0 0 2  Control/stop worrying 1 0 0 2  Worry too much - different things 1 0 0 2  Trouble relaxing 1 0 1 2  Restless 0 0 0 2  Easily annoyed or irritable 2 0 1 2  Afraid - awful might happen 0 0 0 2  Total GAD 7 Score 6 0 2 14  Anxiety Difficulty  Not difficult at all  Extremely difficult

## 2023-09-02 NOTE — Assessment & Plan Note (Signed)
 A1c 6.9% and uACR UTD. Foot exam UTD. Vaccines UTD. Retinal eye exam UTD. Recommend heart healthy diet such as Mediterranean diet with whole grains, fruits, vegetable, fish, lean meats, nuts, and olive oil. Limit salt. Encouraged moderate walking, 3-5 times/week for 30-50 minutes each session. Aim for at least 150 minutes.week. Goal should be pace of 3 miles/hours, or walking 1.5 miles in 30 minutes. Seek medical care for urinary frequency, extreme thirst, vision changes, lightheadedness, dizziness.  Restart Metformin  500mg  BID. Follow up in 6 months

## 2023-09-02 NOTE — Assessment & Plan Note (Signed)
 Counseled on importance of weight management for overall health. Encouraged low calorie, heart healthy diet and moderate intensity exercise 150 minutes weekly. This is 3-5 times weekly for 30-50 minutes each session. Goal should be pace of 3 miles/hours, or walking 1.5 miles in 30 minutes and include strength training.

## 2023-09-02 NOTE — Progress Notes (Signed)
 Subjective:  HPI: Margaret Blackburn is a 58 y.o. female presenting on 09/02/2023 for Follow-up (6 month follow up. GAD-7 score of 6 and PHQ-9 score of 5.)   HPI Patient is in today for follow up for chronic conditions. Has not been taking her Metformin  or Rosuvastatin  due to forgetting to take them and traveling. Ms Puskas does continue to smoke 1 PPD cigarettes, is interested in quitting, has tried Chantix  in the past with good success and would like to try this again.  DM: not taking metformin  or monitoring at home, A1c 6.9% HLD: uncontrolled, not taking rosuvastatin  Tobacco use: 1 PPD  DIABETES Hypoglycemic episodes:no Polydipsia/polyuria: no Visual disturbance: no Chest pain: no Paresthesias: no Glucose Monitoring: no  Accucheck frequency: Not Checking  Fasting glucose:  Post prandial:  Evening:  Before meals: Taking Insulin?: no  Long acting insulin:  Short acting insulin: Blood Pressure Monitoring: not checking Retinal Examination: Up to Date Foot Exam: Up to Date Diabetic Education: Completed Pneumovax: Up to Date Influenza: Up to Date Aspirin: no  HYPERLIPIDEMIA Hyperlipidemia status: poor compliance Satisfied with current treatment?  yes Side effects:  no Medication compliance: poor compliance Past cholesterol meds:  rosuvastatin  Supplements: none Aspirin:  no The 10-year ASCVD risk score (Arnett DK, et al., 2019) is: 9.9%   Values used to calculate the score:     Age: 30 years     Clincally relevant sex: Female     Is Non-Hispanic African American: No     Diabetic: Yes     Tobacco smoker: Yes     Systolic Blood Pressure: 124 mmHg     Is BP treated: No     HDL Cholesterol: 48 mg/dL     Total Cholesterol: 191 mg/dL Chest pain:  no Coronary artery disease:  no Family history CAD:  no Family history early CAD:  no    Review of Systems  All other systems reviewed and are negative.   Relevant past medical history reviewed and updated as indicated.    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   BREAST LUMPECTOMY WITH NEEDLE LOCALIZATION Right 03/03/2018   Procedure: BREAST LUMPECTOMY WITH NEEDLE LOCALIZATION;  Surgeon: Nicholaus Selinda Birmingham, MD;  Location: [facility] ORS;  Service: General;  Laterality: Right;   COLONOSCOPY WITH PROPOFOL  N/A 08/31/2019   Procedure: COLONOSCOPY WITH PROPOFOL ;  Surgeon: Therisa Bi, MD;  Location: [facility] ENDOSCOPY;  Service: Gastroenterology;  Laterality: N/A;   SENTINEL NODE BIOPSY Right 03/21/2018   Procedure: RIGHT SENTINEL LYMPH  NODE BIOPSY;  Surgeon: Nicholaus Selinda Birmingham, MD;  Location: [facility] ORS;  Service: General;  Laterality: Right;    Allergies and medications reviewed and updated.   Current Outpatient Medications:    albuterol  (VENTOLIN  HFA) 108 (90 Base) MCG/ACT inhaler, Inhale 2 puffs into  the lungs every 6 (six) hours as needed for wheezing or shortness of breath., Disp: 18 g, Rfl: 5   ARIPiprazole  (ABILIFY ) 2 MG tablet, Take 1 tablet (2 mg total) by mouth daily. antidepressant augmentation., Disp: 30 tablet, Rfl: 0   azelastine  (ASTELIN ) 0.1 % nasal spray, Place 2 sprays into both nostrils 2 (two) times daily. Use in each nostril as directed, Disp: 30 mL, Rfl: 12   cetirizine  (ZYRTEC ) 10 MG tablet, Take 1 tablet (10 mg total) by mouth daily., Disp: 30 tablet, Rfl: 11   diazepam (VALIUM) 5 MG tablet, Take 5 mg by mouth every 6 (six) hours as  needed for anxiety., Disp: , Rfl:    DULoxetine  (CYMBALTA ) 60 MG capsule, Take 1 capsule (60 mg total) by mouth daily. For depression., Disp: 30 capsule, Rfl: 0   fluticasone  (FLONASE ) 50 MCG/ACT nasal spray, Place 2 sprays into both nostrils daily., Disp: 16 g, Rfl: 1   Fluticasone -Umeclidin-Vilant (TRELEGY ELLIPTA ) 100-62.5-25 MCG/ACT AEPB, Inhale 1 puff into the lungs daily., Disp: 1 each, Rfl: 11   hydrOXYzine  (ATARAX ) 25 MG tablet, Take 1 tablet (25 mg total) by mouth 3 (three) times daily as needed for anxiety., Disp: 90 tablet, Rfl: 0   metFORMIN  (GLUCOPHAGE ) 500 MG tablet, TAKE ONE TABLET BY MOUTH TWICE A DAY WITH A MEAL, Disp: 180 tablet, Rfl: 3   mirtazapine (REMERON) 15 MG tablet, Take 15 mg by mouth at bedtime., Disp: , Rfl:    nicotine  (NICODERM CQ  - DOSED IN MG/24 HOURS) 21 mg/24hr patch, Place 1 patch (21 mg total) onto the skin daily. (May buy from over the counter): For smoking cessation, Disp: 28 patch, Rfl: 0   omeprazole (PRILOSEC) 20 MG capsule, Take 20 mg by mouth daily., Disp: , Rfl:    traZODone  (DESYREL ) 50 MG tablet, Take 1 tablet (50 mg total) by mouth at bedtime as needed for sleep., Disp: 30 tablet, Rfl: 0   Varenicline  Tartrate, Starter, (CHANTIX  STARTING MONTH PAK) 0.5 MG X 11 & 1 MG X 42 TBPK, Take one 0.5 mg tablet by mouth once daily for 3 days, then increase to one 0.5 mg tablet twice daily for 4 days, then increase to one 1 mg tablet twice daily., Disp: 1 each, Rfl: 0   rosuvastatin  (CRESTOR ) 10 MG tablet, Take 1 tablet (10 mg total) by mouth daily., Disp: 90 tablet, Rfl: 1  Allergies  Allergen Reactions   Penicillins Rash and Other (See Comments)    DID THE REACTION INVOLVE: Swelling of the face/tongue/throat, SOB, or low BP? No Sudden or severe rash/hives, skin peeling, or the inside of the mouth or nose? Yes, blisters. Not immediate. Did it require medical treatment? Yes When did it last happen? 40s If all above answers are "NO", may proceed with  cephalosporin use.     Objective:   BP 124/76   Pulse 79   Temp 97.9 F (36.6 C)   Ht 5' 2 (1.575 m)   Wt 185 lb 3.2 oz (84 kg)   SpO2 99%   BMI 33.87 kg/m      09/02/2023    8:02 AM 05/01/2023    9:08 AM 04/24/2023    9:55 AM  Vitals with BMI  Height 5' 2 5' 2 5' 2  Weight 185 lbs 3 oz 209 lbs 202 lbs  BMI 33.86 38.22 36.94  Systolic 124 122 877  Diastolic 76 80 78  Pulse 79 101 111     Physical Exam Vitals and nursing note reviewed.  Constitutional:      Appearance: Normal appearance. She is obese.  HENT:     Head: Normocephalic and atraumatic.  Cardiovascular:     Rate and Rhythm: Normal rate and regular rhythm.     Pulses: Normal pulses.     Heart sounds: Normal heart sounds.  Pulmonary:     Effort: Pulmonary effort is normal.     Breath sounds: Normal breath sounds.  Skin:    General: Skin is warm and dry.  Neurological:     General: No focal deficit present.     Mental Status: She is alert and oriented to person, place, and time. Mental status is at baseline.  Psychiatric:        Mood and Affect: Mood normal.        Behavior: Behavior normal.        Thought Content: Thought content normal.        Judgment: Judgment normal.     Assessment & Plan:  Mixed hyperlipidemia Assessment & Plan: Cholesterol not at goal, restart Rosuvastatin  at 10mg  daily.  I recommend consuming a heart healthy diet such as Mediterranean diet or DASH diet with whole grains, fruits, vegetable, fish, lean meats, nuts, and olive oil. Limit sweets and processed foods. I also encourage moderate intensity exercise 150 minutes weekly. This is 3-5 times weekly for 30-50 minutes each session. Goal should be pace of 3 miles/hours, or walking 1.5 miles in 30 minutes. The 10-year ASCVD risk score (Arnett DK, et al., 2019) is: 9.9% Lipid Panel     Component Value Date/Time   CHOL 191 08/26/2023 0759   CHOL 154 12/05/2021 1053   TRIG 171 (H) 08/26/2023 0759   HDL 48 (L) 08/26/2023  0759   HDL 51 12/05/2021 1053   CHOLHDL 4.0 08/26/2023 0759   LDLCALC 114 (H) 08/26/2023 0759   LABVLDL 17 12/05/2021 1053     Orders: -     Rosuvastatin  Calcium ; Take 1 tablet (10 mg total) by mouth daily.  Dispense: 90 tablet; Refill: 1  Tobacco dependence due to cigarettes Assessment & Plan: Benign lung CT 07/31/2022. 3-5 minute discussion regarding the harms of tobacco use, the benefits of cessation, and methods of cessation. Discussed that there are medication options to help with cessation. Provided printed education on steps to quit smoking. Patient is ready to use a medication to help. Start Chantix .    Depression, major, single episode, moderate (HCC) Assessment & Plan: Chronic. Followed by Psychiatry, encouraged to follow up if symptoms are uncontrolled. Continue Cymbalta , Valium, Abilfy, Hydroxyzine , and Trazodone . No current SI/HI and she does have action plan if these occur.     09/02/2023    8:10 AM 05/01/2023    9:11 AM 04/24/2023    9:58 AM 03/04/2023    8:33 AM 12/31/2022   10:44 AM  Depression screen PHQ 2/9  Decreased Interest 1 0 0 2 3  Down, Depressed, Hopeless 1 0 0 2 3  PHQ - 2 Score 2 0 0 4 6  Altered sleeping 0 0 1 2 1   Tired, decreased energy 1 0 1 2 3   Change in appetite 1 0 0 2 2  Feeling bad or failure about yourself  1 0 0 2 2  Trouble concentrating 0 0 0 2 1  Moving slowly or fidgety/restless 0 0 0 2 0  Suicidal thoughts 0 0 0 1 1  PHQ-9 Score 5 0 2 17 16   Difficult doing work/chores  Not difficult at all Not difficult at  all Extremely dIfficult Somewhat difficult       GAD (generalized anxiety disorder) Assessment & Plan: Chronic. Well controlled on current regimen. Continue Cymbalta , Valium, Abilfy, Hydroxyzine , and Trazodone . No current SI/HI and she does have action plan if these occur.     09/02/2023    8:11 AM 05/01/2023    9:11 AM 04/24/2023    9:58 AM 03/04/2023    8:38 AM  GAD 7 : Generalized Anxiety Score  Nervous, Anxious, on  Edge 1 0 0 2  Control/stop worrying 1 0 0 2  Worry too much - different things 1 0 0 2  Trouble relaxing 1 0 1 2  Restless 0 0 0 2  Easily annoyed or irritable 2 0 1 2  Afraid - awful might happen 0 0 0 2  Total GAD 7 Score 6 0 2 14  Anxiety Difficulty  Not difficult at all  Extremely difficult       Class 2 severe obesity due to excess calories with serious comorbidity and body mass index (BMI) of 37.0 to 37.9 in adult [facility]) Assessment & Plan: Counseled on importance of weight management for overall health. Encouraged low calorie, heart healthy diet and moderate intensity exercise 150 minutes weekly. This is 3-5 times weekly for 30-50 minutes each session. Goal should be pace of 3 miles/hours, or walking 1.5 miles in 30 minutes and include strength training.   Diabetes mellitus treated with oral medication (HCC) Assessment & Plan: A1c 6.9% and uACR UTD. Foot exam UTD. Vaccines UTD. Retinal eye exam UTD. Recommend heart healthy diet such as Mediterranean diet with whole grains, fruits, vegetable, fish, lean meats, nuts, and olive oil. Limit salt. Encouraged moderate walking, 3-5 times/week for 30-50 minutes each session. Aim for at least 150 minutes.week. Goal should be pace of 3 miles/hours, or walking 1.5 miles in 30 minutes. Seek medical care for urinary frequency, extreme thirst, vision changes, lightheadedness, dizziness.  Restart Metformin  500mg  BID. Follow up in 6 months   Aortic atherosclerosis (HCC) Assessment & Plan: Well controlled HTN and DM, restarting rosuvastatin    Other orders -     Varenicline  Tartrate (Starter); Take one 0.5 mg tablet by mouth once daily for 3 days, then increase to one 0.5 mg tablet twice daily for 4 days, then increase to one 1 mg tablet twice daily.  Dispense: 1 each; Refill: 0     Follow up plan: Return in about 6 weeks (around 10/14/2023) for chronic follow-up with labs 1 week prior.  Jeoffrey GORMAN Barrio, FNP

## 2023-09-02 NOTE — Assessment & Plan Note (Signed)
 Well controlled HTN and DM, restarting rosuvastatin 

## 2023-09-04 ENCOUNTER — Other Ambulatory Visit: Payer: Self-pay

## 2023-09-04 DIAGNOSIS — Z122 Encounter for screening for malignant neoplasm of respiratory organs: Secondary | ICD-10-CM

## 2023-09-04 DIAGNOSIS — F17210 Nicotine dependence, cigarettes, uncomplicated: Secondary | ICD-10-CM

## 2023-09-04 DIAGNOSIS — Z87891 Personal history of nicotine dependence: Secondary | ICD-10-CM

## 2023-10-09 ENCOUNTER — Other Ambulatory Visit

## 2023-10-16 ENCOUNTER — Ambulatory Visit: Admitting: Family Medicine

## 2023-12-09 ENCOUNTER — Encounter: Payer: Self-pay | Admitting: Radiology

## 2023-12-25 ENCOUNTER — Other Ambulatory Visit: Payer: Self-pay | Admitting: Oncology

## 2023-12-25 DIAGNOSIS — Z1231 Encounter for screening mammogram for malignant neoplasm of breast: Secondary | ICD-10-CM

## 2023-12-26 ENCOUNTER — Other Ambulatory Visit

## 2023-12-26 DIAGNOSIS — E782 Mixed hyperlipidemia: Secondary | ICD-10-CM

## 2023-12-26 DIAGNOSIS — Z6837 Body mass index (BMI) 37.0-37.9, adult: Secondary | ICD-10-CM

## 2023-12-26 DIAGNOSIS — E119 Type 2 diabetes mellitus without complications: Secondary | ICD-10-CM

## 2023-12-27 LAB — CBC WITH DIFFERENTIAL/PLATELET
Absolute Lymphocytes: 2106 {cells}/uL (ref 850–3900)
Absolute Monocytes: 627 {cells}/uL (ref 200–950)
Basophils Absolute: 56 {cells}/uL (ref 0–200)
Basophils Relative: 0.5 %
Eosinophils Absolute: 146 {cells}/uL (ref 15–500)
Eosinophils Relative: 1.3 %
HCT: 45.2 % — ABNORMAL HIGH (ref 35.0–45.0)
Hemoglobin: 15.2 g/dL (ref 11.7–15.5)
MCH: 31.3 pg (ref 27.0–33.0)
MCHC: 33.6 g/dL (ref 32.0–36.0)
MCV: 93.2 fL (ref 80.0–100.0)
MPV: 10 fL (ref 7.5–12.5)
Monocytes Relative: 5.6 %
Neutro Abs: 8266 {cells}/uL — ABNORMAL HIGH (ref 1500–7800)
Neutrophils Relative %: 73.8 %
Platelets: 316 Thousand/uL (ref 140–400)
RBC: 4.85 Million/uL (ref 3.80–5.10)
RDW: 12.8 % (ref 11.0–15.0)
Total Lymphocyte: 18.8 %
WBC: 11.2 Thousand/uL — ABNORMAL HIGH (ref 3.8–10.8)

## 2023-12-27 LAB — LIPID PANEL
Cholesterol: 173 mg/dL (ref <200)
HDL: 47 mg/dL — ABNORMAL LOW (ref >=50)
LDL Cholesterol (Calc): 104 mg/dL — ABNORMAL HIGH
Non-HDL Cholesterol (Calc): 126 mg/dL (ref <130)
Total CHOL/HDL Ratio: 3.7 (calc) (ref <5.0)
Triglycerides: 128 mg/dL (ref <150)

## 2023-12-27 LAB — COMPREHENSIVE METABOLIC PANEL WITH GFR
AG Ratio: 1.8 (calc) (ref 1.0–2.5)
ALT: 9 U/L (ref 6–29)
AST: 9 U/L — ABNORMAL LOW (ref 10–35)
Albumin: 4.2 g/dL (ref 3.6–5.1)
Alkaline phosphatase (APISO): 57 U/L (ref 37–153)
BUN: 12 mg/dL (ref 7–25)
CO2: 27 mmol/L (ref 20–32)
Calcium: 9.2 mg/dL (ref 8.6–10.4)
Chloride: 106 mmol/L (ref 98–110)
Creat: 0.62 mg/dL (ref 0.50–1.03)
Globulin: 2.3 g/dL (ref 1.9–3.7)
Glucose, Bld: 129 mg/dL — ABNORMAL HIGH (ref 65–99)
Potassium: 4.4 mmol/L (ref 3.5–5.3)
Sodium: 138 mmol/L (ref 135–146)
Total Bilirubin: 0.4 mg/dL (ref 0.2–1.2)
Total Protein: 6.5 g/dL (ref 6.1–8.1)
eGFR: 104 mL/min/1.73m2 (ref >=60)

## 2023-12-27 LAB — HEMOGLOBIN A1C
Hgb A1c MFr Bld: 6.1 % — ABNORMAL HIGH (ref <5.7)
Mean Plasma Glucose: 128 mg/dL
eAG (mmol/L): 7.1 mmol/L

## 2023-12-30 ENCOUNTER — Ambulatory Visit: Payer: Self-pay | Admitting: Family Medicine

## 2024-01-06 ENCOUNTER — Ambulatory Visit: Admitting: Family Medicine

## 2024-01-06 ENCOUNTER — Encounter: Payer: Self-pay | Admitting: Family Medicine

## 2024-01-06 VITALS — BP 115/67 | HR 90 | Temp 98.6°F | Ht 62.0 in | Wt 178.0 lb

## 2024-01-06 DIAGNOSIS — R000 Tachycardia, unspecified: Secondary | ICD-10-CM | POA: Insufficient documentation

## 2024-01-06 DIAGNOSIS — F332 Major depressive disorder, recurrent severe without psychotic features: Secondary | ICD-10-CM

## 2024-01-06 DIAGNOSIS — D72829 Elevated white blood cell count, unspecified: Secondary | ICD-10-CM | POA: Insufficient documentation

## 2024-01-06 DIAGNOSIS — M47816 Spondylosis without myelopathy or radiculopathy, lumbar region: Secondary | ICD-10-CM

## 2024-01-06 DIAGNOSIS — E782 Mixed hyperlipidemia: Secondary | ICD-10-CM

## 2024-01-06 DIAGNOSIS — E119 Type 2 diabetes mellitus without complications: Secondary | ICD-10-CM

## 2024-01-06 DIAGNOSIS — F17210 Nicotine dependence, cigarettes, uncomplicated: Secondary | ICD-10-CM

## 2024-01-06 MED ORDER — MELOXICAM 7.5 MG PO TABS: 7.5000 mg | ORAL_TABLET | Freq: Every day | ORAL | 0 refills | Status: DC

## 2024-01-06 MED ORDER — TRELEGY ELLIPTA 100-62.5-25 MCG/ACT IN AEPB: 1.0000 | INHALATION_SPRAY | Freq: Every day | RESPIRATORY_TRACT | 11 refills | Status: AC

## 2024-01-06 MED ORDER — VARENICLINE TARTRATE (STARTER) 0.5 MG X 11 & 1 MG X 42 PO TBPK: ORAL_TABLET | ORAL | 0 refills | Status: DC

## 2024-01-06 NOTE — Progress Notes (Signed)
 Acute Office Visit  Patient ID: Margaret Blackburn, female    DOB: [birth year], 58 y.o.   MRN: [id number]  PCP: Margaret Jeoffrey RAMAN, FNP  Chief Complaint  Patient presents with   Follow-up    Review lab work from 11/20    Subjective:     HPI  Ms Margaret Blackburn is here today to discuss recent labs and follow up for chronic conditions. PMH includes DM, HLD, tobacco use.  DM: did not tolerate Metformin  500mg  daily due to GI upset Lab Results  Component Value Date   HGBA1C 6.1 (H) 12/26/2023   HGBA1C 6.9 (H) 08/26/2023   HGBA1C 7.0 (H) 12/31/2022   HLD: restarted Rosuvastatin  10mg  daily Lipid Panel     Component Value Date/Time   CHOL 173 12/26/2023 0836   CHOL 154 12/05/2021 1053   TRIG 128 12/26/2023 0836   HDL 47 (L) 12/26/2023 0836   HDL 51 12/05/2021 1053   CHOLHDL 3.7 12/26/2023 0836   LDLCALC 104 (H) 12/26/2023 0836   LABVLDL 17 12/05/2021 1053   Nicotine  dependence: got down to 0.5ppd prior to recent increase in stress, chantix  worked well   Discussed the use of AI scribe software for clinical note transcription with the patient, who gave verbal consent to proceed.  History of Present Illness Margaret Blackburn is a 58 year old female with diabetes and hyperlipidemia who presents for medication management and follow-up.  She has been off her medications due to the recent passing of her brother, which disrupted her routine. She has resumed taking Cymbalta  and Abilify  but had stopped taking Crestor  for approximately three months. She restarted Crestor  recently after having been off of it for approximately three months. She also stopped taking metformin  due to severe gastrointestinal side effects, including stomach upset and constipation.  She has been off Chantix  for several months and wants to restart it to help with smoking cessation. She smokes two packs a year and had previously reduced her smoking to half a pack a day.  Her diabetes management has improved, with her A1c  decreasing from 6.9 to 6.1. She attributes this improvement to better eating habits and consuming protein drinks every morning. She has not been exercising but is trying to stay busy and eat better.  She experiences worsening arthritis symptoms, particularly in her lower back, hands, feet, and ankles, exacerbated by the cold weather. She describes morning stiffness and difficulty moving, likening it to a 'paralyzed feeling.' She has been using ibuprofen  and homemade remedies like oregano oil for relief but is cautious about overuse due to potential side effects.  She reports episodes of her heart racing, especially after minimal exertion, such as walking from her car to the house. No chest pain, lightheadedness, dizziness, or any symptoms above her baseline shortness of breath. She has a history of bronchitis and felt it might have been flaring up recently, though she has not experienced any fever, body aches, or other signs of infection.   Review of Systems  All other systems reviewed and are negative.      Objective:    BP 115/67   Pulse 90   Temp 98.6 F (37 C)   Ht 5' 2 (1.575 m)   Wt 178 lb (80.7 kg)   SpO2 93%   BMI 32.56 kg/m    Physical Exam Vitals and nursing note reviewed.  Constitutional:      Appearance: Normal appearance. She is normal weight.  HENT:     Head: Normocephalic and atraumatic.  Cardiovascular:     Rate and Rhythm: Regular rhythm. Tachycardia present.     Pulses: Normal pulses.     Heart sounds: Normal heart sounds.  Pulmonary:     Effort: Pulmonary effort is normal.     Breath sounds: Normal breath sounds.  Skin:    General: Skin is warm and dry.  Neurological:     General: No focal deficit present.     Mental Status: She is alert and oriented to person, place, and time. Mental status is at baseline.  Psychiatric:        Mood and Affect: Mood normal.        Behavior: Behavior normal.        Thought Content: Thought content normal.         Judgment: Judgment normal.       No results found for any visits on 01/06/24.     Assessment & Plan:   Problem List Items Addressed This Visit     Lumbar facet arthropathy   Major depressive disorder, recurrent severe without psychotic features (HCC)   Tobacco dependence due to cigarettes   Relevant Medications   Varenicline  Tartrate, Starter, (CHANTIX  STARTING MONTH PAK) 0.5 MG X 11 & 1 MG X 42 TBPK   Diabetes mellitus treated with oral medication (HCC) - Primary   Relevant Orders   Microalbumin / creatinine urine ratio   Mixed hyperlipidemia   Relevant Orders   Lipid panel   Comprehensive metabolic panel with GFR   Leukocytosis   Relevant Orders   CBC with Differential/Platelet   Tachycardia   Relevant Orders   EKG 12-Lead    Assessment and Plan Assessment & Plan Type 2 diabetes mellitus, currently off metformin , improved glycemic control Glycemic control improved with A1c reduced from 6.9 to 6.1. Off metformin  due to gastrointestinal side effects. - Monitor glycemic control closely. - Obtained urine sample for proteinuria.  Mixed hyperlipidemia, on rosuvastatin  Cholesterol levels improved but not at goal. Recently restarted rosuvastatin . - Continue rosuvastatin .  Nicotine  dependence, cigarette use, restarting varenicline  Reduced smoking to two packs a year. Restarting varenicline  for cessation. - Restarted varenicline  with new starter pack.  Polyarticular osteoarthritis with lumbar spondylosis Increased arthritis pain in lower back, hands, feet, and ankles, exacerbated by cold weather. Not taking arthritis medications regularly. - Prescribed meloxicam  for pain. - Advised against meloxicam  with ibuprofen . - Recommended Voltaren  gel or capsaicin rubs. - Suggested heat application for stiffness.  Tachycardia, under evaluation Episodes of tachycardia with increased heart rate during mild exertion. No chest pain, lightheadedness, or dizziness. - Performed EKG to  evaluate heart rhythm. - EKG NSR  Depression and generalized anxiety disorder, stable on current regimen Emotionally stable on Cymbalta  and Abilify . Uses Valium as needed for anxiety. - Continue current medication regimen.  Elevated white blood cell count, monitoring for resolution Previous labs showed elevated white blood cell count, possibly due to stress or mild respiratory infection. No current infection symptoms. - Rechecked white blood cell count.  General Health Maintenance Overdue for Pap smear, not necessary due to partial hysterectomy. - No Pap smear needed due to partial hysterectomy.    Meds ordered this encounter  Medications   Varenicline  Tartrate, Starter, (CHANTIX  STARTING MONTH PAK) 0.5 MG X 11 & 1 MG X 42 TBPK    Sig: Take one 0.5 mg tablet by mouth once daily for 3 days, then increase to one 0.5 mg tablet twice daily for 4 days, then increase to one 1 mg tablet twice daily.  Dispense:  1 each    Refill:  0   Fluticasone -Umeclidin-Vilant (TRELEGY ELLIPTA ) 100-62.5-25 MCG/ACT AEPB    Sig: Inhale 1 puff into the lungs daily.    Dispense:  1 each    Refill:  11   meloxicam  (MOBIC ) 7.5 MG tablet    Sig: Take 1 tablet (7.5 mg total) by mouth daily.    Dispense:  30 tablet    Refill:  0    Supervising Provider:   DUANNE LOWERS T [[id number]]    Return in about 3 months (around 04/05/2024) for chronic follow-up with labs 1 week prior.  Jeoffrey GORMAN Barrio, FNP [facility]

## 2024-01-07 LAB — MICROALBUMIN / CREATININE URINE RATIO
Creatinine, Urine: 118 mg/dL (ref 20–275)
Microalb Creat Ratio: 3 mg/g{creat} (ref <30)
Microalb, Ur: 0.4 mg/dL

## 2024-01-07 LAB — CBC WITH DIFFERENTIAL/PLATELET
Absolute Lymphocytes: 2444 {cells}/uL (ref 850–3900)
Absolute Monocytes: 582 {cells}/uL (ref 200–950)
Basophils Absolute: 52 {cells}/uL (ref 0–200)
Basophils Relative: 0.5 %
Eosinophils Absolute: 187 {cells}/uL (ref 15–500)
Eosinophils Relative: 1.8 %
HCT: 41.8 % (ref 35.9–46.0)
Hemoglobin: 14.3 g/dL (ref 11.7–15.5)
MCH: 31 pg (ref 27.0–33.0)
MCHC: 34.2 g/dL (ref 31.6–35.4)
MCV: 90.5 fL (ref 81.4–101.7)
MPV: 10.2 fL (ref 7.5–12.5)
Monocytes Relative: 5.6 %
Neutro Abs: 7134 {cells}/uL (ref 1500–7800)
Neutrophils Relative %: 68.6 %
Platelets: 330 Thousand/uL (ref 140–400)
RBC: 4.62 Million/uL (ref 3.80–5.10)
RDW: 12.9 % (ref 11.0–15.0)
Total Lymphocyte: 23.5 %
WBC: 10.4 Thousand/uL (ref 3.8–10.8)

## 2024-01-28 ENCOUNTER — Other Ambulatory Visit: Payer: Self-pay

## 2024-02-07 ENCOUNTER — Ambulatory Visit
Admission: RE | Admit: 2024-02-07 | Discharge: 2024-02-07 | Disposition: A | Discharge status: Home / Self Care | Source: Ambulatory Visit | Attending: Oncology | Admitting: Oncology

## 2024-02-07 DIAGNOSIS — Z1231 Encounter for screening mammogram for malignant neoplasm of breast: Secondary | ICD-10-CM | POA: Insufficient documentation

## 2024-02-11 ENCOUNTER — Other Ambulatory Visit: Payer: Self-pay | Admitting: Family Medicine

## 2024-02-11 MED ORDER — VARENICLINE TARTRATE 1 MG PO TABS: 1.0000 mg | ORAL_TABLET | Freq: Two times a day (BID) | ORAL | 1 refills | Status: AC

## 2024-02-18 ENCOUNTER — Other Ambulatory Visit

## 2024-02-18 DIAGNOSIS — E782 Mixed hyperlipidemia: Secondary | ICD-10-CM

## 2024-02-26 ENCOUNTER — Other Ambulatory Visit: Payer: Self-pay | Admitting: Family Medicine

## 2024-04-02 ENCOUNTER — Other Ambulatory Visit

## 2024-04-06 ENCOUNTER — Ambulatory Visit: Admitting: Family Medicine
